# Patient Record
Sex: Male | Born: 1935 | Race: White | Hispanic: No | Marital: Married | Smoking: Former smoker
Health system: Southern US, Community
[De-identification: ages and names within clinical notes are randomized; demographics above are authoritative.]

## PROBLEM LIST (undated history)

## (undated) DIAGNOSIS — K219 Gastro-esophageal reflux disease without esophagitis: Secondary | ICD-10-CM

## (undated) DIAGNOSIS — Z951 Presence of aortocoronary bypass graft: Secondary | ICD-10-CM

## (undated) DIAGNOSIS — Z7901 Long term (current) use of anticoagulants: Secondary | ICD-10-CM

## (undated) DIAGNOSIS — M545 Low back pain, unspecified: Secondary | ICD-10-CM

## (undated) DIAGNOSIS — I509 Heart failure, unspecified: Secondary | ICD-10-CM

## (undated) DIAGNOSIS — K5790 Diverticulosis of intestine, part unspecified, without perforation or abscess without bleeding: Secondary | ICD-10-CM

## (undated) DIAGNOSIS — I714 Abdominal aortic aneurysm, without rupture, unspecified: Secondary | ICD-10-CM

## (undated) DIAGNOSIS — E785 Hyperlipidemia, unspecified: Secondary | ICD-10-CM

## (undated) DIAGNOSIS — M1990 Unspecified osteoarthritis, unspecified site: Secondary | ICD-10-CM

## (undated) DIAGNOSIS — Z950 Presence of cardiac pacemaker: Secondary | ICD-10-CM

## (undated) DIAGNOSIS — I209 Angina pectoris, unspecified: Secondary | ICD-10-CM

## (undated) DIAGNOSIS — I2510 Atherosclerotic heart disease of native coronary artery without angina pectoris: Secondary | ICD-10-CM

## (undated) DIAGNOSIS — G4730 Sleep apnea, unspecified: Secondary | ICD-10-CM

## (undated) DIAGNOSIS — I4821 Permanent atrial fibrillation: Secondary | ICD-10-CM

## (undated) DIAGNOSIS — I10 Essential (primary) hypertension: Secondary | ICD-10-CM

## (undated) DIAGNOSIS — IMO0001 Reserved for inherently not codable concepts without codable children: Secondary | ICD-10-CM

## (undated) HISTORY — PX: CATARACT EXTRACTION W/ INTRAOCULAR LENS  IMPLANT, BILATERAL: SHX1307

## (undated) HISTORY — PX: JOINT REPLACEMENT: SHX530

## (undated) HISTORY — PX: EYE SURGERY: SHX253

## (undated) HISTORY — DX: Gastro-esophageal reflux disease without esophagitis: K21.9

## (undated) HISTORY — DX: Presence of aortocoronary bypass graft: Z95.1

## (undated) HISTORY — DX: Abdominal aortic aneurysm, without rupture: I71.4

## (undated) HISTORY — DX: Unspecified osteoarthritis, unspecified site: M19.90

## (undated) HISTORY — DX: Permanent atrial fibrillation: I48.21

## (undated) HISTORY — DX: Low back pain: M54.5

## (undated) HISTORY — DX: Abdominal aortic aneurysm, without rupture, unspecified: I71.40

## (undated) HISTORY — DX: Presence of cardiac pacemaker: Z95.0

## (undated) HISTORY — DX: Long term (current) use of anticoagulants: Z79.01

## (undated) HISTORY — PX: TONSILLECTOMY: SUR1361

## (undated) HISTORY — DX: Diverticulosis of intestine, part unspecified, without perforation or abscess without bleeding: K57.90

## (undated) HISTORY — DX: Low back pain, unspecified: M54.50

## (undated) HISTORY — DX: Heart failure, unspecified: I50.9

## (undated) HISTORY — DX: Atherosclerotic heart disease of native coronary artery without angina pectoris: I25.10

## (undated) HISTORY — DX: Hyperlipidemia, unspecified: E78.5

---

## 1987-09-18 HISTORY — PX: CORONARY ARTERY BYPASS GRAFT: SHX141

## 1997-12-26 ENCOUNTER — Other Ambulatory Visit: Admission: RE | Admit: 1997-12-26 | Discharge: 1997-12-26 | Payer: Self-pay | Admitting: Cardiology

## 1999-09-01 ENCOUNTER — Ambulatory Visit (HOSPITAL_COMMUNITY): Admission: RE | Admit: 1999-09-01 | Discharge: 1999-09-01 | Payer: Self-pay | Admitting: Cardiology

## 2001-02-01 ENCOUNTER — Ambulatory Visit (HOSPITAL_COMMUNITY): Admission: RE | Admit: 2001-02-01 | Discharge: 2001-02-01 | Payer: Self-pay | Admitting: Gastroenterology

## 2003-04-04 ENCOUNTER — Encounter: Payer: Self-pay | Admitting: Internal Medicine

## 2003-04-04 ENCOUNTER — Encounter: Admission: RE | Admit: 2003-04-04 | Discharge: 2003-04-04 | Payer: Self-pay | Admitting: Internal Medicine

## 2004-08-26 ENCOUNTER — Ambulatory Visit (HOSPITAL_COMMUNITY): Admission: RE | Admit: 2004-08-26 | Discharge: 2004-08-26 | Payer: Self-pay | Admitting: Ophthalmology

## 2004-09-09 ENCOUNTER — Ambulatory Visit (HOSPITAL_COMMUNITY): Admission: AD | Admit: 2004-09-09 | Discharge: 2004-09-09 | Payer: Self-pay | Admitting: Ophthalmology

## 2005-06-19 ENCOUNTER — Encounter: Admission: RE | Admit: 2005-06-19 | Discharge: 2005-06-19 | Payer: Self-pay | Admitting: Internal Medicine

## 2005-10-23 ENCOUNTER — Encounter: Admission: RE | Admit: 2005-10-23 | Discharge: 2005-10-23 | Payer: Self-pay | Admitting: Internal Medicine

## 2007-02-15 HISTORY — PX: INSERT / REPLACE / REMOVE PACEMAKER: SUR710

## 2007-02-24 ENCOUNTER — Encounter (INDEPENDENT_AMBULATORY_CARE_PROVIDER_SITE_OTHER): Payer: Self-pay | Admitting: *Deleted

## 2007-02-24 ENCOUNTER — Inpatient Hospital Stay (HOSPITAL_COMMUNITY): Admission: AD | Admit: 2007-02-24 | Discharge: 2007-02-26 | Payer: Self-pay | Admitting: Cardiology

## 2008-10-23 ENCOUNTER — Encounter: Admission: RE | Admit: 2008-10-23 | Discharge: 2008-10-23 | Payer: Self-pay | Admitting: Internal Medicine

## 2010-02-03 ENCOUNTER — Encounter: Admission: RE | Admit: 2010-02-03 | Discharge: 2010-02-03 | Payer: Self-pay | Admitting: Cardiology

## 2010-02-06 ENCOUNTER — Ambulatory Visit (HOSPITAL_COMMUNITY): Admission: RE | Admit: 2010-02-06 | Discharge: 2010-02-06 | Payer: Self-pay | Admitting: Cardiology

## 2010-03-24 ENCOUNTER — Ambulatory Visit: Payer: Self-pay | Admitting: Cardiology

## 2010-04-14 ENCOUNTER — Ambulatory Visit: Payer: Self-pay | Admitting: Cardiology

## 2010-06-21 ENCOUNTER — Encounter: Payer: Self-pay | Admitting: Internal Medicine

## 2010-06-24 ENCOUNTER — Ambulatory Visit: Payer: Self-pay | Admitting: Cardiovascular Disease

## 2010-07-07 ENCOUNTER — Ambulatory Visit: Payer: Self-pay

## 2010-07-11 ENCOUNTER — Ambulatory Visit: Payer: Self-pay | Admitting: Cardiology

## 2010-08-14 ENCOUNTER — Ambulatory Visit: Payer: Self-pay | Admitting: Cardiology

## 2010-09-16 NOTE — Miscellaneous (Signed)
Summary: Device preload  Clinical Lists Changes  Observations: Added new observation of PPM INDICATN: A-flutter (06/21/2010 13:01) Added new observation of MAGNET RTE: BOL 85 ERI 65 (06/21/2010 13:01) Added new observation of PPMLEADSTAT2: active (06/21/2010 13:01) Added new observation of PPMLEADSER2: [id number]  (06/21/2010 13:01) Added new observation of PPMLEADMOD2: 4470  (06/21/2010 13:01) Added new observation of PPMLEADDOI2: 04/04/2007  (06/21/2010 13:01) Added new observation of PPMLEADLOC2: RV  (06/21/2010 13:01) Added new observation of PPMLEADSTAT1: active  (06/21/2010 13:01) Added new observation of PPMLEADSER1: 518841  (06/21/2010 13:01) Added new observation of PPMLEADMOD1: 4469  (06/21/2010 13:01) Added new observation of PPMLEADDOI1: 04/04/2007  (06/21/2010 13:01) Added new observation of PPMLEADLOC1: RA  (06/21/2010 13:01) Added new observation of PPM DOI: 04/04/2007  (06/21/2010 13:01) Added new observation of PPM SERL#: [id number]  (06/21/2010 13:01) Added new observation of PPM MODL#: [id number]  (06/21/2010 13:01) Added new observation of PACEMAKERMFG: Medtronic  (06/21/2010 13:01) Added new observation of PPM IMP MD: Duffy Rhody Tennant,MD  (06/21/2010 13:01) Added new observation of PPM REFER MD: Rolla Plate  (06/21/2010 13:01) Added new observation of PACEMAKER MD: Lewayne Bunting, MD  (06/21/2010 13:01)      PPM Specifications Following MD:  Lewayne Bunting, MD     Referring MD:  Rolla Plate PPM Vendor:  Medtronic     PPM Model Number:  [id number]     PPM Serial Number:  [id number] PPM DOI:  04/04/2007     PPM Implanting MD:  Rolla Plate  Lead 1    Location: RA     DOI: 04/04/2007     Model #: 4469     Serial #: [id number]     Status: active Lead 2    Location: RV     DOI: 04/04/2007     Model #: 4470     Serial #: [id number]     Status: active  Magnet Response Rate:  BOL 85 ERI 65  Indications:  A-flutter

## 2010-09-16 NOTE — Procedures (Signed)
Summary: pacer check/medtronic   Current Medications (verified): 1)  Metoprolol Tartrate 25 Mg Tabs (Metoprolol Tartrate) .... Take 1 Tablet By Mouth Once Daily 2)  Glucosamine 1500 Complex  Caps (Glucosamine-Chondroit-Vit C-Mn) .... Take 1 Capsule By Mouth Once Daily 3)  Fish Oil 1000 Mg Caps (Omega-3 Fatty Acids) .... Take 1 Capsule By Mouth Once Daily 4)  Vitamin D 1000 Unit Tabs (Cholecalciferol) .... Take 2000 International Units By Mouth  Once Daily 5)  Furosemide 20 Mg Tabs (Furosemide) .... Take 1 Tablet By Mouth Once Daily 6)  Aspir-Low 81 Mg Tbec (Aspirin) .... Take 1 Tablet By Mouth Once Daily 7)  Tylenol 325 Mg Tabs (Acetaminophen) .... Take 650mg  By Mouth Two Times A Day 8)  Lipitor 10 Mg Tabs (Atorvastatin Calcium) .... Take 1 Tablet By Mouth Once Daily 9)  Amlodipine Besylate 5 Mg Tabs (Amlodipine Besylate) .... Take 1 Tablet By Mouth Once Daily 10)  Omeprazole 20 Mg Cpdr (Omeprazole) .... Take 1 Capsule By Mouth As Needed  Allergies (verified): 1)  ! Morphine  PPM Specifications Following MD:  Lewayne Bunting, MD     Referring MD:  Rolla Plate PPM Vendor:  Medtronic     PPM Model Number:  [id number]     PPM Serial Number:  [id number] PPM DOI:  04/04/2007     PPM Implanting MD:  Rolla Plate  Lead 1    Location: RA     DOI: 04/04/2007     Model #: 4469     Serial #: [id number]     Status: active Lead 2    Location: RV     DOI: 04/04/2007     Model #: 4470     Serial #: [id number]     Status: active  Magnet Response Rate:  BOL 85 ERI 65  Indications:  A-flutter   PPM Follow Up Battery Voltage:  2.99 V       PPM Device Measurements Atrium  Amplitude: 3.0 mV, Impedance: 432 ohms, Threshold: 0.5 V at 0.4 msec Right Ventricle  Amplitude: 12.4 mV, Impedance: 432 ohms, Threshold: 1.0 V at 0.4 msec  Episodes MS Episodes:  0     Ventricular High Rate:  0     Atrial Pacing:  91.7%     Ventricular Pacing:  93.7%  Parameters Mode:  DDDR     Lower Rate Limit:  70     Upper  Rate Limit:  120 Paced AV Delay:  320     Sensed AV Delay:  300 Next Cardiology Appt Due:  09/17/2010 Tech Comments:  [place] [facility] PT---NORMAL DEVICE FUNCTION.  NO EPISODES RECORDED ON CHECK.  CHANGED PAV DELAY FROM 180 TO 320 AND SAV DELAY FROM 200 TO 300 TO DECREASE AMOUNT OF VP. PT ENROLLED IN [place] AND WILL CONTINUE TRANSMISSIONS AFTER OV W/SK.   ROV IN 3 MTHS W/SK. Vella Kohler  July 07, 2010 5:28 PM

## 2010-10-08 ENCOUNTER — Encounter: Payer: Self-pay | Admitting: Internal Medicine

## 2010-10-08 ENCOUNTER — Encounter (INDEPENDENT_AMBULATORY_CARE_PROVIDER_SITE_OTHER): Payer: Medicare Other | Admitting: Internal Medicine

## 2010-10-08 DIAGNOSIS — I482 Chronic atrial fibrillation, unspecified: Secondary | ICD-10-CM | POA: Insufficient documentation

## 2010-10-08 DIAGNOSIS — Z950 Presence of cardiac pacemaker: Secondary | ICD-10-CM

## 2010-10-08 DIAGNOSIS — I2510 Atherosclerotic heart disease of native coronary artery without angina pectoris: Secondary | ICD-10-CM

## 2010-10-08 DIAGNOSIS — I25810 Atherosclerosis of coronary artery bypass graft(s) without angina pectoris: Secondary | ICD-10-CM | POA: Insufficient documentation

## 2010-10-08 DIAGNOSIS — I4891 Unspecified atrial fibrillation: Secondary | ICD-10-CM

## 2010-10-08 DIAGNOSIS — I119 Hypertensive heart disease without heart failure: Secondary | ICD-10-CM | POA: Insufficient documentation

## 2010-10-08 DIAGNOSIS — I498 Other specified cardiac arrhythmias: Secondary | ICD-10-CM

## 2010-10-13 ENCOUNTER — Ambulatory Visit (INDEPENDENT_AMBULATORY_CARE_PROVIDER_SITE_OTHER): Payer: Medicare Other | Admitting: Nurse Practitioner

## 2010-10-13 DIAGNOSIS — I2510 Atherosclerotic heart disease of native coronary artery without angina pectoris: Secondary | ICD-10-CM

## 2010-10-13 DIAGNOSIS — I10 Essential (primary) hypertension: Secondary | ICD-10-CM

## 2010-10-13 DIAGNOSIS — I4891 Unspecified atrial fibrillation: Secondary | ICD-10-CM

## 2010-10-14 NOTE — Assessment & Plan Note (Signed)
Summary: 2-21- spk with pt aware of appt.gd   Visit Type:  PPM-Medtronic Referring Provider:  Dr. Deborah Chalk  CC:  no complaints.  History of Present Illness: Adam [name] is seen to establish pacemaker followup.  He is an 77 year old gentleman status post pacing 3 years ago for bradycardia receiving a Medtronic device with [employer] leads.  He has a history of coronary artery disease with bypass surgery in the late 80s. He had a catheterization the report of which we reviewed him a couple years ago demonstrating patent vein grafts bo all but his RCA. His ejection fraction at that time (2008) was normal. Myoview scanning July 2010 head no evidence of ischemia and normal ejection fraction.  He was found in the spring of 2011 to have atrial fibrillation. He underwent cardioversion. Subtotally his Coumadin was discontinued.  The thromboembolic risk factors are notable for age-23 vascular disease-one hypertension-1  Preventive Screening-Counseling & Management  Alcohol-Tobacco     Smoking Status: quit  Caffeine-Diet-Exercise     Does Patient Exercise: yes      Drug Use:  no.    Current Medications (verified): 1)  Metoprolol Tartrate 25 Mg Tabs (Metoprolol Tartrate) .[name].. 1 Two Times A Day 2)  Glucosamine 1500 Complex  Caps (Glucosamine-Chondroit-Vit C-Mn) .... Take 1 Capsule By Mouth Once Daily 3)  Fish Oil 1000 Mg Caps (Omega-3 Fatty Acids) .... Take 1 Capsule By Mouth Once Daily 4)  Vitamin D 1000 Unit Tabs (Cholecalciferol) .... Take 2000 International Units By Mouth  Once Daily 5)  Furosemide 20 Mg Tabs (Furosemide) .... Take 1 Tablet By Mouth Once Daily 6)  Aspir-Low 81 Mg Tbec (Aspirin) .... Take 1 Tablet By Mouth Once Daily 7)  Tylenol 325 Mg Tabs (Acetaminophen) .... Take 650mg  By Mouth Two Times A Day 8)  Lipitor 10 Mg Tabs (Atorvastatin Calcium) .... Take 1 Tablet By Mouth Once Daily 9)  Amlodipine Besylate 5 Mg Tabs (Amlodipine Besylate) .... Take 1 Tablet By Mouth Once  Daily 10)  Omeprazole 20 Mg Cpdr (Omeprazole) .... Take 1 Capsule By Mouth As Needed  Allergies (verified): 1)  ! Morphine  Past History:  Past Medical History: Last updated: 10/07/2010 Aneurysm-Aortic G E R D Hyperlipidemia CAD Irritable Bowel Syndrome Osteoarthritis Atrial Fibrillation  Past Surgical History: Bilateral knee surgeries Pacemaker Implantation 2008 CABG 1980s Cataract surgery bilateral  Family History: Father: Family History of Coronary Artery Disease:   Social History: Retired  Married  Tobacco Use - Former. [phone] Alcohol Use - no Regular Exercise - yes Drug Use - no Smoking Status:  quit Does Patient Exercise:  yes Drug Use:  no  Review of Systems       full review of systems was negative apart from a history of present illness and past medical historyexcept urinary retention and hard of hearing using hearing aids   Vital Signs:  Patient profile:   77 year old male Height:      68 inches Weight:      156.44 pounds BMI:     23.87 Pulse rate:   83 / minute BP sitting:   126 / 76  (left arm) Cuff size:   regular  Vitals Entered By: Caralee Ates CMA (October 08, 2010 10:41 AM)  Physical Exam  General:  Alert and oriented elderly Caucasian male appearing his stated agein no acute distress. HEENT  normal . Neck veins were flat; carotids brisk and full without bruits. No lymphadenopathy. Back without kyphosis. Lungs clear. Heart sounds regular without murmurs or  gallops. PMI nondisplaced. Abdomen soft with active bowel sounds without midline pulsation or hepatomegaly. Femoral pulses and distal pulses intact. Extremities were without clubbing cyanosis or edemaSkin warm and dry. Neurological exam grossly normal; affect was engaging no lymph nodes    PPM Specifications Following MD:  Lewayne Bunting, MD     Referring MD:  Rolla Plate PPM Vendor:  Medtronic     PPM Model Number:  [id number]     PPM Serial Number:  [id number] H PPM DOI:   04/04/2007     PPM Implanting MD:  Rolla Plate  Lead 1    Location: RA     DOI: 04/04/2007     Model #: 4469     Serial #: [id number]     Status: active Lead 2    Location: RV     DOI: 04/04/2007     Model #: 4470     Serial #: [id number]     Status: active  Magnet Response Rate:  BOL 85 ERI 65  Indications:  A-flutter   Parameters Mode:  DDDR     Lower Rate Limit:  70     Upper Rate Limit:  120 Paced AV Delay:  320     Sensed AV Delay:  300  Impression & Recommendations:  Problem # 1:  PACEMAKER, PERMANENT (ICD-V45.01) Device parameters and data were reviewed and no changes were made  he has had recurrent atrial fibrillation. The surface with a moderately rapid ventricular response although there is no associated symptoms that he can recall  Problem # 2:  ATRIAL FIBRILLATION (ICD-427.31) as above. It would be appropriate with his CHADS VASC score of 3-4 and a be a long-term oral anticoagulation. I would recommend that we start this and stopped his aspirin. He does have some bleeding with aspirin is superficial. I will check with Dr. Deborah Chalk His updated medication list for this problem includes:    Metoprolol Tartrate 25 Mg Tabs (Metoprolol tartrate) .[name]... 1 two times a day    Aspir-low 81 Mg Tbec (Aspirin) .[name]... Take 1 tablet by mouth once daily  Problem # 3:  SINUS BRADYCARDIA (ICD-427.81) he is atrially paced to possibly 80% of the time His updated medication list for this problem includes:    Metoprolol Tartrate 25 Mg Tabs (Metoprolol tartrate) .[name]... 1 two times a day    Aspir-low 81 Mg Tbec (Aspirin) .[name]... Take 1 tablet by mouth once daily    Amlodipine Besylate 5 Mg Tabs (Amlodipine besylate) .[name]... Take 1 tablet by mouth once daily  Problem # 4:  CORONARY ATHEROSCLEROSIS, ARTERY BYPASS GRAFT (ICD-414.04) stable on current medications His updated medication list for this problem includes:    Metoprolol Tartrate 25 Mg Tabs (Metoprolol tartrate) .[name]... 1 two times a day     Aspir-low 81 Mg Tbec (Aspirin) .[name]... Take 1 tablet by mouth once daily    Amlodipine Besylate 5 Mg Tabs (Amlodipine besylate) .[name]... Take 1 tablet by mouth once daily  Patient Instructions: 1)  Your physician recommends that you continue on your current medications as directed. Please refer to the Current Medication list given to you today. 2)  Your physician wants you to follow-up IO:NGEX WITH DR Graciela Husbands DEVICE   You will receive a reminder letter in the mail two months in advance. If you don't receive a letter, please call our office to schedule the follow-up appointment.

## 2010-10-16 ENCOUNTER — Encounter (INDEPENDENT_AMBULATORY_CARE_PROVIDER_SITE_OTHER): Payer: Medicare Other

## 2010-10-16 DIAGNOSIS — I4891 Unspecified atrial fibrillation: Secondary | ICD-10-CM

## 2010-10-16 DIAGNOSIS — Z7901 Long term (current) use of anticoagulants: Secondary | ICD-10-CM

## 2010-10-20 ENCOUNTER — Encounter (INDEPENDENT_AMBULATORY_CARE_PROVIDER_SITE_OTHER): Payer: Medicare Other

## 2010-10-20 DIAGNOSIS — Z7901 Long term (current) use of anticoagulants: Secondary | ICD-10-CM

## 2010-10-20 DIAGNOSIS — I4891 Unspecified atrial fibrillation: Secondary | ICD-10-CM

## 2010-10-23 ENCOUNTER — Encounter (INDEPENDENT_AMBULATORY_CARE_PROVIDER_SITE_OTHER): Payer: Self-pay | Admitting: *Deleted

## 2010-10-23 NOTE — Cardiovascular Report (Signed)
Summary: Office Visit   Office Visit   Imported By: Roderic Ovens 10/16/2010 16:06:02  _____________________________________________________________________  External Attachment:    Type:   Image     Comment:   External Document

## 2010-10-27 ENCOUNTER — Encounter (INDEPENDENT_AMBULATORY_CARE_PROVIDER_SITE_OTHER): Payer: Medicare Other

## 2010-10-27 DIAGNOSIS — Z7901 Long term (current) use of anticoagulants: Secondary | ICD-10-CM

## 2010-10-27 DIAGNOSIS — I4891 Unspecified atrial fibrillation: Secondary | ICD-10-CM

## 2010-10-28 NOTE — Letter (Signed)
Summary: Device-Delinquent Check  [facility], Main Office  [address]   [place], [place] [address]   Phone: [phone]  Fax: [phone]     October 23, 2010 [name]: [id number]   Adam [name] [address] [place], [place]  [address]   Dear Mr. Estala,  According to our records, you have not had your implanted device checked in the recommended period of time.  We are unable to determine appropriate device function without checking your device on a regular basis.  Please call our office to schedule an appointment as soon as possible.  If you are having your device checked by another physician, please call us so that we may update our records.  Thank you,  Letta Moynahan, EMT  October 23, 2010 12:47 PM   [facility]

## 2010-11-03 ENCOUNTER — Ambulatory Visit (INDEPENDENT_AMBULATORY_CARE_PROVIDER_SITE_OTHER): Payer: Medicare Other | Admitting: Cardiology

## 2010-11-03 DIAGNOSIS — I2510 Atherosclerotic heart disease of native coronary artery without angina pectoris: Secondary | ICD-10-CM

## 2010-11-03 DIAGNOSIS — Z7901 Long term (current) use of anticoagulants: Secondary | ICD-10-CM

## 2010-11-10 ENCOUNTER — Ambulatory Visit (INDEPENDENT_AMBULATORY_CARE_PROVIDER_SITE_OTHER): Payer: Medicare Other | Admitting: *Deleted

## 2010-11-10 DIAGNOSIS — I2510 Atherosclerotic heart disease of native coronary artery without angina pectoris: Secondary | ICD-10-CM

## 2010-11-10 DIAGNOSIS — Z7901 Long term (current) use of anticoagulants: Secondary | ICD-10-CM

## 2010-11-10 LAB — POCT INR: INR: 2.5

## 2010-12-03 ENCOUNTER — Other Ambulatory Visit (INDEPENDENT_AMBULATORY_CARE_PROVIDER_SITE_OTHER): Payer: Medicare Other | Admitting: *Deleted

## 2010-12-03 DIAGNOSIS — I4891 Unspecified atrial fibrillation: Secondary | ICD-10-CM

## 2010-12-09 ENCOUNTER — Encounter: Payer: Medicare Other | Admitting: *Deleted

## 2010-12-11 ENCOUNTER — Telehealth: Payer: Self-pay | Admitting: Cardiology

## 2010-12-11 NOTE — Telephone Encounter (Signed)
Would like to get a handicap permit. Please call back. I have pulled the chart.

## 2010-12-11 NOTE — Telephone Encounter (Signed)
RN left voicemail for pt to pick up a handicap sticker form from [facility] and drop it off here for Dr. Deborah Chalk to sign.  Pt told to call back with any questions.

## 2010-12-15 ENCOUNTER — Encounter: Payer: Self-pay | Admitting: Cardiology

## 2010-12-15 ENCOUNTER — Encounter: Payer: Self-pay | Admitting: Nurse Practitioner

## 2010-12-15 ENCOUNTER — Ambulatory Visit (INDEPENDENT_AMBULATORY_CARE_PROVIDER_SITE_OTHER): Payer: Medicare Other | Admitting: Nurse Practitioner

## 2010-12-15 VITALS — BP 122/66 | Wt 150.0 lb

## 2010-12-15 DIAGNOSIS — I4891 Unspecified atrial fibrillation: Secondary | ICD-10-CM

## 2010-12-15 DIAGNOSIS — I25810 Atherosclerosis of coronary artery bypass graft(s) without angina pectoris: Secondary | ICD-10-CM

## 2010-12-15 LAB — BASIC METABOLIC PANEL
BUN: 17 mg/dL (ref 6–23)
CO2: 30 mEq/L (ref 19–32)
Calcium: 9.3 mg/dL (ref 8.4–10.5)
Chloride: 99 mEq/L (ref 96–112)
Creatinine, Ser: 1 mg/dL (ref 0.4–1.5)
GFR: 80.3 mL/min (ref >60.00)
Glucose, Bld: 195 mg/dL — ABNORMAL HIGH (ref 70–99)
Potassium: 5.2 mEq/L — ABNORMAL HIGH (ref 3.5–5.1)
Sodium: 137 mEq/L (ref 135–145)

## 2010-12-15 LAB — CBC WITH DIFFERENTIAL/PLATELET
Basophils Absolute: 0 10*3/uL (ref 0.0–0.1)
Basophils Relative: 0.3 % (ref 0.0–3.0)
Eosinophils Absolute: 0.1 10*3/uL (ref 0.0–0.7)
Eosinophils Relative: 0.7 % (ref 0.0–5.0)
HCT: 38.2 % — ABNORMAL LOW (ref 39.0–52.0)
Hemoglobin: 12.9 g/dL — ABNORMAL LOW (ref 13.0–17.0)
Lymphocytes Relative: 17.5 % (ref 12.0–46.0)
Lymphs Abs: 1.5 10*3/uL (ref 0.7–4.0)
MCHC: 33.9 g/dL (ref 30.0–36.0)
MCV: 102.8 fl — ABNORMAL HIGH (ref 78.0–100.0)
Monocytes Absolute: 0.5 10*3/uL (ref 0.1–1.0)
Monocytes Relative: 6.3 % (ref 3.0–12.0)
Neutro Abs: 6.5 10*3/uL (ref 1.4–7.7)
Neutrophils Relative %: 75.2 % (ref 43.0–77.0)
Platelets: 141 10*3/uL — ABNORMAL LOW (ref 150.0–400.0)
RBC: 3.71 Mil/uL — ABNORMAL LOW (ref 4.22–5.81)
RDW: 14.1 % (ref 11.5–14.6)
WBC: 8.7 10*3/uL (ref 4.5–10.5)

## 2010-12-15 LAB — POCT INR: INR: 3

## 2010-12-15 LAB — TSH: TSH: 1.5 u[IU]/mL (ref 0.35–5.50)

## 2010-12-15 MED ORDER — DIGOXIN 125 MCG PO TABS: 125.0000 ug | ORAL_TABLET | Freq: Every day | ORAL | Status: DC

## 2010-12-15 NOTE — Progress Notes (Signed)
Vinson Moselle Date of Birth: 01-22-1925   History of Present Illness: Mr. Adam [name] is seen today for a work in visit. He is seen for Dr. Deborah Chalk. He has not felt well for about 2 weeks. He has less endurance. He is more short of breath. He wonders if he is back out of rhythm. He denies chest pain. He is more fatigued. He is not able to swim as far. He will be switching to Dr. Katrinka Blazing for his cardiology care. He has an appointment in June.   Current Outpatient Prescriptions on File Prior to Visit  Medication Sig Dispense Refill  . acetaminophen (TYLENOL) 650 MG CR tablet Take 1,300 mg by mouth daily.        [name] amLODipine (NORVASC) 5 MG tablet Take 5 mg by mouth daily.        [name] atorvastatin (LIPITOR) 10 MG tablet Take 10 mg by mouth daily.        . furosemide (LASIX) 20 MG tablet Take 20 mg by mouth daily.        . Glucosamine-Chondroit-Vit C-Mn (GLUCOSAMINE 1500 COMPLEX) CAPS Take by mouth daily.        . metoprolol tartrate (LOPRESSOR) 25 MG tablet Take 25 mg by mouth 2 (two) times daily.        . Omega-3 Fatty Acids (FISH OIL) 1000 MG CPDR Take by mouth daily.        [name] omeprazole (PRILOSEC) 20 MG capsule Take 20 mg by mouth daily.        [name] VITAMIN D, CHOLECALCIFEROL, PO Take 2,000 Int'l Units by mouth daily.        . WARFARIN SODIUM PO Take by mouth. As directed       . digoxin (LANOXIN) 0.125 MG tablet Take 1 tablet (125 mcg total) by mouth daily.  30 tablet  11    Allergies  Allergen Reactions  . Morphine     Past Medical History  Diagnosis Date  . CAD (coronary artery disease)   . S/P CABG (coronary artery bypass graft)   . Hyperlipidemia   . GERD (gastroesophageal reflux disease)   . Generalized OA   . Presence of permanent cardiac pacemaker   . PAF (paroxysmal atrial fibrillation)     Cardioversion June 2011  . Chronic anticoagulation   . Aortic aneurysm     Past Surgical History  Procedure Date  . Bilateral knee arthroscopy   . Coronary artery bypass graft Feb 1989    . Cardiac catheterization July 2008    Occluded SVG to LCX  . Cardiovascular stress test July 2010    No ischemia. Normal EF  . Permanent pacemaker July 2008    History  Smoking status  . Former Smoker  . Quit date: 08/17/1948  Smokeless tobacco  . Never Used    History  Alcohol Use No    Family History  Problem Relation Age of Onset  . Stroke Mother   . Heart attack Father     Review of Systems: The review of systems is as above. He does not really have palpitations. He is not really lightheaded. He just feels weak, short of breath and washed out.  All other systems were reviewed and are negative.  Physical Exam: BP 122/66  Wt 150 lb (68.04 kg) Patient is very pleasant and in no acute distress. He looks younger than his stated age. Skin is warm and dry. Color is normal.  HEENT is unremarkable. Normocephalic/atraumatic. PERRL. Sclera are nonicteric. Neck  is supple. No masses. No JVD. Lungs are clear. Cardiac exam shows an irregular rate and rhythm. Abdomen is soft. Extremities are without edema. Gait and ROM are intact. No gross neurologic deficits noted.  LABORATORY DATA:  Pacemaker check shows recurrent atrial fib. This episode has been going on for about 16 days. His EKG shows V Pacing.                                         INR today is 3.0  Assessment / Plan:

## 2010-12-15 NOTE — Assessment & Plan Note (Signed)
He is not having chest pain. We will keep him on his current regimen.

## 2010-12-15 NOTE — Assessment & Plan Note (Signed)
His pacemaker check shows recurrent atrial fib. The episode coincides with the duration of his symptoms. I have discussed his case with Dr. Deborah Chalk. We will try to smooth him out with Digoxin. We will start him on 0.125mg  daily. He may require antiarrhythmic therapy. He is on his coumadin and has been therapeutic. We will check his labs today. He has an appointment to see Dr. Deborah Chalk on the 11th of May. He will be switching to Dr. Katrinka Blazing in June. Patient is agreeable to this plan and will call if any problems develop in the interim.

## 2010-12-15 NOTE — Patient Instructions (Addendum)
I am going to start you on Digoxin (lanoxin). One tablet daily. Keep your appointment next week with Dr. Deborah Chalk. We will recheck your pacemaker on return. Your activity may be as you tolerate.

## 2010-12-16 ENCOUNTER — Telehealth: Payer: Self-pay | Admitting: *Deleted

## 2010-12-16 NOTE — Telephone Encounter (Signed)
Message copied by Barnetta Hammersmith on Tue Dec 16, 2010  1:09 PM ------      Message from: Norma Fredrickson      Created: Mon Dec 15, 2010  4:53 PM       Ok to report. Labs are satisfactory.

## 2010-12-16 NOTE — Telephone Encounter (Signed)
Pt notified of lab results and to continue same medications.   

## 2010-12-26 ENCOUNTER — Ambulatory Visit (INDEPENDENT_AMBULATORY_CARE_PROVIDER_SITE_OTHER): Payer: Medicare Other | Admitting: Cardiology

## 2010-12-26 ENCOUNTER — Encounter: Payer: Self-pay | Admitting: Cardiology

## 2010-12-26 ENCOUNTER — Telehealth: Payer: Self-pay | Admitting: *Deleted

## 2010-12-26 VITALS — BP 146/88 | HR 74 | Ht 68.0 in | Wt 161.4 lb

## 2010-12-26 DIAGNOSIS — I4891 Unspecified atrial fibrillation: Secondary | ICD-10-CM

## 2010-12-26 LAB — CBC WITH DIFFERENTIAL/PLATELET
Basophils Absolute: 0 10*3/uL (ref 0.0–0.1)
Basophils Relative: 0.4 % (ref 0.0–3.0)
Eosinophils Absolute: 0.1 10*3/uL (ref 0.0–0.7)
Eosinophils Relative: 0.6 % (ref 0.0–5.0)
HCT: 39.7 % (ref 39.0–52.0)
Hemoglobin: 13.6 g/dL (ref 13.0–17.0)
Lymphocytes Relative: 23.9 % (ref 12.0–46.0)
Lymphs Abs: 1.9 10*3/uL (ref 0.7–4.0)
MCHC: 34.2 g/dL (ref 30.0–36.0)
MCV: 102.6 fl — ABNORMAL HIGH (ref 78.0–100.0)
Monocytes Absolute: 0.7 10*3/uL (ref 0.1–1.0)
Monocytes Relative: 8.2 % (ref 3.0–12.0)
Neutro Abs: 5.4 10*3/uL (ref 1.4–7.7)
Neutrophils Relative %: 66.9 % (ref 43.0–77.0)
Platelets: 124 10*3/uL — ABNORMAL LOW (ref 150.0–400.0)
RBC: 3.87 Mil/uL — ABNORMAL LOW (ref 4.22–5.81)
RDW: 13.9 % (ref 11.5–14.6)
WBC: 8.1 10*3/uL (ref 4.5–10.5)

## 2010-12-26 LAB — COMPREHENSIVE METABOLIC PANEL
ALT: 28 U/L (ref 0–53)
AST: 29 U/L (ref 0–37)
Albumin: 4.2 g/dL (ref 3.5–5.2)
Alkaline Phosphatase: 64 U/L (ref 39–117)
BUN: 14 mg/dL (ref 6–23)
CO2: 30 mEq/L (ref 19–32)
Calcium: 9.3 mg/dL (ref 8.4–10.5)
Chloride: 106 mEq/L (ref 96–112)
Creatinine, Ser: 0.7 mg/dL (ref 0.4–1.5)
GFR: 108.82 mL/min (ref >60.00)
Glucose, Bld: 93 mg/dL (ref 70–99)
Potassium: 5.7 mEq/L — ABNORMAL HIGH (ref 3.5–5.1)
Sodium: 143 mEq/L (ref 135–145)
Total Bilirubin: 0.7 mg/dL (ref 0.3–1.2)
Total Protein: 6.9 g/dL (ref 6.0–8.3)

## 2010-12-26 LAB — TSH: TSH: 2.37 u[IU]/mL (ref 0.35–5.50)

## 2010-12-26 LAB — POCT INR: INR: 2.8

## 2010-12-26 NOTE — Assessment & Plan Note (Signed)
He has recurrent atrial fibrillation and I think we can proceed on with cardioversion. He has a permanent pacemaker and will need Medtronic to be available to interrogate the device. He's been anticoagulated for 2 months and does have symptoms of shortness of breath is most consistent with atrial fibrillation. He subsequent followup will be with Dr. Garnette Scheuermann beginning on January 16, 2011

## 2010-12-26 NOTE — Progress Notes (Signed)
Subjective:   Adam [name] is noted increasing shortness of breath that correlates with the return of atrial fibrillation after interrogation with his pacemaker. He has been on warfarin anticoagulation since March 5 when he was therapeutic. He had cardioversion for atrial fibrillation in June of 2011 and did well for 6-8 months.  He has an appointment to see Dr. Katrinka Blazing for cardiology followup on June 1. We will try to proceed on with cardioversion next week in hopes that he will have a good result with simple cardioversion and not need the addition of antiarrhythmic drugs.  He has known ischemic heart disease and a history of bypass grafting in February of 1989. He a followup catheterization in July 2008 that showed well-preserved left ventricular function with atrial flutter. He's had severe three-vessel coronary disease and had a totally occluded saphenous vein graft to left circumflex and a patent saphenous vein graft to the diagonal, patent saphenous vein graft the distal branches the right coronary artery, and a patent left internal mammary graft to the LAD. We felt we could manage him medically. He a stress Cardiolite study in July 2010 which showed good exercise tolerance and an ejection fraction of 61%. He's had bilateral knee surgeries. He's had a history of hyperlipemia. He a permanent transvenous pacemaker placed in July 2008. He had cardioversion for atrial fibrillation in June of 2011.  Current Outpatient Prescriptions  Medication Sig Dispense Refill  . acetaminophen (TYLENOL) 650 MG CR tablet Take 1,300 mg by mouth daily.        [name] amLODipine (NORVASC) 5 MG tablet Take 5 mg by mouth daily.        [name] atorvastatin (LIPITOR) 10 MG tablet Take 10 mg by mouth daily.        . digoxin (LANOXIN) 0.125 MG tablet Take 1 tablet (125 mcg total) by mouth daily.  30 tablet  11  . furosemide (LASIX) 20 MG tablet Take 20 mg by mouth daily.        . Glucosamine-Chondroit-Vit C-Mn (GLUCOSAMINE 1500 COMPLEX) CAPS Take  by mouth daily.        . metoprolol tartrate (LOPRESSOR) 25 MG tablet Take 25 mg by mouth 2 (two) times daily.        . Omega-3 Fatty Acids (FISH OIL) 1000 MG CPDR Take by mouth daily.        [name] omeprazole (PRILOSEC) 20 MG capsule Take 20 mg by mouth daily.        [name] VITAMIN D, CHOLECALCIFEROL, PO Take 2,000 Int'l Units by mouth daily.        . WARFARIN SODIUM PO Take by mouth. As directed         Allergies  Allergen Reactions  . Morphine     Patient Active Problem List  Diagnoses  . HYPERTENSION, HEART CONTROLLED W/O ASSOC CHF  . CORONARY ATHEROSCLEROSIS, ARTERY BYPASS GRAFT  . ATRIAL FIBRILLATION  . PACEMAKER, PERMANENT    History  Smoking status  . Former Smoker  . Quit date: 08/17/1948  Smokeless tobacco  . Never Used    History  Alcohol Use No    Family History  Problem Relation Age of Onset  . Stroke Mother   . Heart attack Father     Review of Systems:   The patient denies any heat or cold intolerance.  No weight gain or weight loss.  The patient denies headaches or blurry vision.  There is no cough or sputum production.  The patient denies dizziness.  There is no hematuria  or hematochezia.  The patient denies any muscle aches or arthritis.  The patient denies any rash.  The patient denies frequent falling or instability.  There is no history of depression or anxiety.  All other systems were reviewed and are negative.   Physical Exam:   Weight is 156. Vital signs are reviewed.The head is normocephalic and atraumatic.  Pupils are equally round and reactive to light.  Sclerae nonicteric.  Conjunctiva is clear.  Oropharynx is unremarkable.  There's adequate oral airway.  Neck is supple there are no masses.  Thyroid is not enlarged.  There is no lymphadenopathy.  Lungs are clear.  Chest is symmetric.  Heart shows a irregular rate and rhythm.  S1 and S2 are normal.  There is soft apical murmur. Abdomen is soft normal bowel sounds.  There is no organomegaly.  Genital and  rectal deferred.  Extremities are without edema.  Peripheral pulses are adequate.  Neurologically intact.  Full range of motion.  The patient is not depressed.  Skin is warm and dry.  Assessment / Plan:

## 2010-12-26 NOTE — Telephone Encounter (Signed)
LM for pt to call back regarding labs.

## 2010-12-27 LAB — DIGOXIN LEVEL: Digoxin Level: 0.6 ng/mL — ABNORMAL LOW (ref 0.8–2.0)

## 2010-12-30 NOTE — Cardiovascular Report (Signed)
NAME:  TAYDEN, NICHELSON NO.:  [id number]   MEDICAL RECORD NO.:  [id number]          PATIENT TYPE:  INP   LOCATION:  6533                         FACILITY:  [facility]   PHYSICIAN:  Colleen Can. Deborah Chalk, M.D.DATE OF BIRTH:  May 14, 1925   DATE OF PROCEDURE:  02/25/2007  DATE OF DISCHARGE:                            CARDIAC CATHETERIZATION   PROCEDURE:  Implantation of a dual-chamber pulse generator system under  fluoroscopy with pacing conversion of atrial flutter to sinus rhythm.   INDICATIONS FOR PROCEDURE:  Atrial flutter with a slow ventricular  response and periods of tachycardia (tachy-brady syndrome).   PROCEDURE:  The right subclavicular area is prepped and draped.  A  subcutaneous pocket was created through the prepectoral fascia using  sharp and Bovie dissection.  There was superficial arterial bleeding  which was controlled with ligatures. Using two punctures into the  subclavian vein, guide wires were introduced.  Using 7-French [facility]  introducers, the atrial and ventricular leads were introduced.  The  ventricular lead was a Guidant bipolar endocardial pacing lead, model  4470 52-cm lead, serial number [id number].  We initially tried to place  this on the RV septum and I was unhappy with the position on the cardiac  silhouette and suggestive of coronary sinus placement.  We subsequently  returned and placed it in the RV apex.  The thresholds obtained show R  waves measure 8.1 mV.  Ventricular capture is 0.5 V with a current of 28  mA and 0.5 msec pulse width.  The ventricular lead impedance was 749  ohms.  The lead was sutured in place.  The atrial lead was a Guidant  model 4469 45-cm lead, serial number [id number].  The lead was  introduced through a 7-French [employer] introducer.  It was positioned in the  right atrial appendage.  The atrial flutter measurements show atrial  flutter waves and a 3.0 mV.  After burst pacing, the atrial flutter was  terminated and  atrial pacing rate of 330 beats per minute.  T waves  measured 2.8 mV.  The atrial capture threshold is 0.6 V with a current  of 1.7 mA at 0.5 msec pulse width. The atrial lead impedance was 433  ohms. The lead was sutured in place.   The wound was flushed with kanamycin solution.  The leads were connected  to a Medtronic EnRhythm P1501DR, serial number [id number].  The unit was  sutured in place.  The wound was closed with 2-0 and, subsequent, 4-0  Vicryl.  Steri-Strips were applied.  The patient tolerated the procedure  well.  The patient left the procedure in sinus rhythm.      Colleen Can. Deborah Chalk, M.D.  Electronically Signed     SNT/MEDQ  D:  02/25/2007  T:  02/26/2007  Job:  [id number]

## 2010-12-30 NOTE — H&P (Signed)
NAME:  Adam [name], Adam [name] NO.:  [id number]   MEDICAL RECORD NO.:  [id number]            PATIENT TYPE:   LOCATION:                                 FACILITY:   PHYSICIAN:  Colleen Can. Deborah Chalk, M.D.DATE OF BIRTH:  Dec 23, 1924   DATE OF ADMISSION:  02/23/2007  DATE OF DISCHARGE:                              HISTORY & PHYSICAL   CHIEF COMPLAINT:  Chest pain.   HISTORY OF PRESENT ILLNESS:  Adam [name] is a very pleasant 77 year old  white male who has a known history of ischemic heart disease.  He had  remote coronary artery bypass grafting in February 1989.  He presents to  the office for a work-in appointment this morning on February 23, 2007.  He  notes that over the last 10 days or so, he began to have recurrent  angina.  He describes chest discomfort that occurs especially with  exertion.  It is very similar to his previous chest pain syndrome.  He  has had it with exercising.  He has had associated shortness of breath  as well as some nausea but no actual vomiting.  Last night he took  nitroglycerin x1 after speaking to the physician on call.  He was  referred to the office this morning.  EKG shows a new onset of atrial  flutter with a slow ventricular response.  He is currently pain-free.  He is referred now for cardiac catheterization prior to initiation of  Coumadin anticoagulation.  He is currently pain-free.   ALLERGIES:  NONE.   CURRENT MEDICATIONS:  1. Zantac 150 a day.  2. Calcium with vitamin D daily.  3. Omeprazole 20 mg a day.  4. Glucosamine daily.  5. Vytorin 10/40 daily.  6. Baby aspirin daily.   PAST MEDICAL HISTORY:  1. Known atherosclerotic cardiovascular disease.  He underwent      coronary artery bypass grafting x6 per Dr. Particia Lather in      February 1989 with left internal mammary artery to the left      anterior descending, vein graft to the diagonal, vein graft to the      posterior descending and posterolateral branches of the right   coronary and a vein graft to the left circumflex, OM1 and OM2.  His      last catheterization occurred in January 2001.  He had normal LV      function.  He had an occluded saphenous vein graft to the left      circumflex with a relatively small vessel with moderate severe      diffuse disease, the LAD was occluded.  There was a patent left      internal mammary graft to the LAD and the vein graft to the right      coronary was patent.  There is an occluded distal right coronary      artery but the vein grafts to the posterior descending and      posterolateral branches were patent.  He has been managed medically      since that time.  His last Cardiolite study  was in August 2006,      which showed no evidence of ischemia with normal LV function.  A      small inferobasal scar could not be excluded, yet was felt to be      consistent with his known coronary anatomy.  2. Hyperlipidemia.  3. Positive family history of coronary disease.  4. GERD.  5. Previous bilateral knee surgeries.  6. Known abdominal aneurysm followed yearly with last measurement at      3.7 cm.  7. Previous colonoscopy in June of 2007.  8. Questionable irritable bowel syndrome.  9. Osteoarthritis.   FAMILY HISTORY:  Positive for heart disease.   SOCIAL HISTORY:  He is married.  He has three children.  He has no  tobacco use.  He does have social alcohol use.   REVIEW OF SYSTEMS:  His chest pain is as described above.  It is  localized to the sternum and really does not seem to be radiating.  He  has had associated shortness of breath and nausea but no vomiting.  He  did have relief with nitroglycerin.  He has had no other episodes of  shortness of breath.  He is not lightheaded or dizzy.  He has had more  in the way of irritable bowel syndrome.  He did have a colonoscopy a  little over one year ago.   PHYSICAL EXAMINATION:  GENERAL APPEARANCE:  He is currently pain-free.  He is very pleasant.  He is in no  acute distress.  VITAL SIGNS:  His weight is 162, blood pressure 146/86 sitting, 146/88  standing, heart rate is 52 and irregular, respirations 18, he is  afebrile.  SKIN:  Warm and dry.  Color is unremarkable.  LUNGS:  Basically clear.  CARDIOVASCULAR:  Irregular and slow rhythm.  ABDOMEN:  Soft.  EXTREMITIES:  Without edema.  He does have a dressing over a laceration  to the lower leg.  NEUROLOGIC:  Intact.  There are no gross focal deficits.   PERTINENT LABORATORY DATA:  Pending.   His EKG shows new onset of atrial flutter with slow ventricular  response.   OVERALL IMPRESSION:  1. Chest pain, exertional.  2. Nonischemic heart disease with remote coronary artery bypass      grafting.  3. New onset of atrial flutter.   PLAN:  Will proceed on with cardiac catheterization prior to initiation  of Coumadin anticoagulation.  He is currently on no rate slowing  medicines.  Further plan to follow per Dr. Ronnald Nian discretion.      Sharlee Blew, N.P.      Colleen Can. Deborah Chalk, M.D.  Electronically Signed    LC/MEDQ  D:  02/23/2007  T:  02/23/2007  Job:  [id number]

## 2010-12-30 NOTE — Cardiovascular Report (Signed)
NAME:  TRADARIUS, REINWALD NO.:  [id number]   MEDICAL RECORD NO.:  [id number]          PATIENT TYPE:  OIB   LOCATION:  2807                         FACILITY:  [facility]   PHYSICIAN:  Colleen Can. Deborah Chalk, M.D.DATE OF BIRTH:  Mar 12, 1925   DATE OF PROCEDURE:  02/23/2007  DATE OF DISCHARGE:                            CARDIAC CATHETERIZATION   Mr. Adam [name] presents with atrial fibrillation, with a controlled  ventricular response.  He is on no rate-slowing medicines.  He has had  intermittent episodes of substernal chest discomfort over the last 2  weeks.  They have had somewhat of an exertional nature.   PROCEDURE:  Left heart catheterization, with selective coronary  angiography, left ventricular angiography, saphenous vein graft  angiography x3, angiography of the left internal mammary artery.   TYPE AND SITE OF ENTRY:  Percutaneous right femoral artery.   CATHETERS:  6-French 4 curved Judkins right and left coronary catheters,  6-French pigtail ventriculographic catheter, left internal mammary graft  catheter, a right coronary artery bypass graft catheter.   CONTRAST MATERIAL:  Omnipaque.   MEDICATIONS GIVEN PRIOR TO THE PROCEDURE:  Valium 10 mg p.o.   MEDICATIONS GIVEN DURING THE PROCEDURE:  Versed 2 mg IV.   COMMENTS:  The patient tolerated the procedure well.   HEMODYNAMIC DATA:  The aortic pressure was 155/67, LV was 1216-11.  There was no aortic valve gradient present on pullback.  Pullback aortic  pressure was 121/61.   ANGIOGRAPHIC DATA:  1. Left main coronary artery is essentially normal.  There is an      atrial branch that arises from what appears to the proximal atrial      branch that arises off of the left circumflex.  2. His left anterior descending is totally occluded after first septal      perforating branch.  3. His left circumflex has diffuse 50% narrowings.  There is a 70-80%      stenosis ostially.  Overall, the left circumflex and multiple  small      branches are without obstructive disease. but have at least 4      different branches, with moderate diffuse 50% stenosis.  4. The right coronary artery is totally occluded in the midportion.  5. His saphenous vein graft to the left circumflex is totally      occluded.  6. Saphenous vein graft to the diagonal vessel is a smooth graft, with      nice a insertion and good distal runoff.  7. His saphenous vein graft to the posterior descending is widely      patent, with excellent flow and good distal runoff.  8. His left internal mammary graft to the LAD is widely patent, with a      nice insertion and excellent distal runoff.   Left ventricular angiogram was performed in RAO position.  Overall  cardiac size is normal.  There is normal global ejection fraction, with  it estimated to be approximately 60%.  Regional wall motion appear to be  normal.  There was no significant mitral regurgitation.   OVERALL IMPRESSION:  1. Well-preserved left  ventricular function, with atrial flutter      present.  2. Totally occluded right coronary artery, totally occluded left      anterior descending, and severe diffuse atherosclerosis in the      small branches of the left circumflex.  3. Totally occluded saphenous vein graft to the left circumflex, but      with patent saphenous vein graft to the diagonal, patent saphenous      to both distal branches of the right coronary artery, and a patent      left internal mammary graft to the LAD.   DISCUSSION:  Overall, this is almost the identical findings as to what  was present in the 2001 catheterization.  It is felt that we can manage  Mr. Cona medically from a coronary standpoint, but will have to  address his atrial flutter.  He does have a relatively slow ventricular  response, and it may be that he will need pacing should there be a  attempted at cardioversion.      Colleen Can. Deborah Chalk, M.D.  Electronically Signed     SNT/MEDQ   D:  02/23/2007  T:  02/24/2007  Job:  [id number]

## 2010-12-31 ENCOUNTER — Telehealth: Payer: Self-pay | Admitting: *Deleted

## 2010-12-31 ENCOUNTER — Encounter: Payer: Medicare Other | Admitting: *Deleted

## 2010-12-31 ENCOUNTER — Ambulatory Visit (HOSPITAL_COMMUNITY)
Admission: RE | Admit: 2010-12-31 | Discharge: 2010-12-31 | Disposition: A | Discharge status: Home / Self Care | Payer: Medicare Other | Source: Ambulatory Visit | Attending: Cardiology | Admitting: Cardiology

## 2010-12-31 DIAGNOSIS — I4891 Unspecified atrial fibrillation: Secondary | ICD-10-CM

## 2010-12-31 DIAGNOSIS — I2510 Atherosclerotic heart disease of native coronary artery without angina pectoris: Secondary | ICD-10-CM | POA: Insufficient documentation

## 2010-12-31 DIAGNOSIS — Z950 Presence of cardiac pacemaker: Secondary | ICD-10-CM | POA: Insufficient documentation

## 2010-12-31 LAB — APTT: aPTT: 38 seconds — ABNORMAL HIGH (ref 24–37)

## 2010-12-31 LAB — PROTIME-INR
INR: 2.58 — ABNORMAL HIGH (ref 0.00–1.49)
Prothrombin Time: 27.8 seconds — ABNORMAL HIGH (ref 11.6–15.2)

## 2010-12-31 NOTE — Telephone Encounter (Signed)
Pt's wife notified of lab results and was instructed to have pt cut back on potassium rich foods.  Pt's wife verbalized to RN understanding of instructions.

## 2010-12-31 NOTE — Telephone Encounter (Signed)
Message copied by Barnetta Hammersmith on Wed Dec 31, 2010  8:03 AM ------      Message from: Roger Shelter      Created: Fri Dec 26, 2010  3:51 PM       Ok, see post cardioversion.

## 2011-01-01 ENCOUNTER — Telehealth: Payer: Self-pay | Admitting: Cardiology

## 2011-01-01 NOTE — Telephone Encounter (Signed)
Labs results reported. Pt informed results also sent to Dr Garnette Scheuermann.

## 2011-01-02 NOTE — Op Note (Signed)
NAME:  Adam [name], Adam [name] NO.:  [id number]   MEDICAL RECORD NO.:  [id number]          PATIENT TYPE:  OIB   LOCATION:  2899                         FACILITY:  [facility]   PHYSICIAN:  Guadelupe Sabin, M.D.DATE OF BIRTH:  09-18-1924   DATE OF PROCEDURE:  09/09/2004  DATE OF DISCHARGE:  09/09/2004                                 OPERATIVE REPORT   READMISSION DIAGNOSIS:  Retained lens.   PREOPERATIVE DIAGNOSIS:  Retained lens fragments following cataract surgery  August 26, 2004.   POSTOPERATIVE DIAGNOSIS:  Retained lens fragments following cataract surgery  August 26, 2004.   NAME OF OPERATION:  Removal of cataract fragments, left eye.   SURGEON:  Guadelupe Sabin, M.D.   ASSISTANT:  Nurse.   ANESTHESIA:  Local 4% Xylocaine, 0.75 Marcaine retrobulbar block with Wydase  added, topical tetracaine.   OPERATIVE PROCEDURE:  After the patient was prepped and draped, a lid  speculum was inserted in the left eye. The eye was turned downward and a  superior rectus traction suture placed. The ocular microscope was aligned.  Incision was made with the MVR blade at the 11 o'clock and 4 o'clock  position of the left eye. The Lewicki anterior chamber infusion terminal 4  to 5 mm length was inserted into the corneal incision and infusion began.  The same MVR blade was used at the 11 o'clock position the handpiece of the  vitreous infusion suction cutter was then placed in the anterior chamber.  Very slow aspiration infusion was then performed and the nuclear cataract  fragment removed as well as other cortical cataract remnants from under the  iris. At the intraocular lens appeared to be well-centered. Movement and  checking of the lens, however, revealed the previously noted dialysis of the  previous of the posterior capsule and zonules from the 6:30 to 8:30  position. It was felt that further manipulation might cause decentration of  the well-centered intraocular lens  implant.  It was therefore elected to  close.  The instruments were withdrawn from the anterior chamber. The  incisions appeared to be self-sealing and no sutures were required. The  anterior chamber remained deep throughout the procedure and at the end of  the procedure. Maxitrol ointment was instilled in the conjunctival cul-de-  sac and a light patch protector shield applied. Duration of procedure 30-45  minutes. The patient tolerated the procedure well in general, left the  operating room for the recovery room in good condition.      HNJ/MEDQ  D:  10/19/2004  T:  10/20/2004  Job:  [id number]

## 2011-01-02 NOTE — H&P (Signed)
NAME:  Adam [name], Adam [name] NO.:  [id number]   MEDICAL RECORD NO.:  [id number]          PATIENT TYPE:  OIB   LOCATION:  2899                         FACILITY:  [facility]   PHYSICIAN:  Guadelupe Sabin, M.D.DATE OF BIRTH:  17-Apr-1925   DATE OF ADMISSION:  09/09/2004  DATE OF DISCHARGE:  09/09/2004                                HISTORY & PHYSICAL   This was a planned outpatient readmission of this 77 year old white male  admitted for retained cataract remnants following cataract surgery August 26, 2004.   PRESENT ILLNESS:  This patient had complicated cataract implant surgery on  August 26, 2004, with zonular dialysis in the infratemporal inferonasal  quadrant of the left eye. There was enough support to place a posterior  chamber intraocular lens implant, and a peripheral iridectomy was performed.  The patient tolerated the procedure well and was discharged as an  outpatient. He was followed in the office postoperatively. He was noted to  have a small nuclear cataract remnant in the anterior chamber angle.  It was  felt that this was causing irritation and inflammation and should be a  removed. The patient was given oral discussion and printed information  concerning the procedure and its possible complications. He signed an  informed consent, and arrangements made for his outpatient admission at this  time.   PAST MEDICAL HISTORY:  The patient is in stable general health under the  care of Dr. Earl Gala  (see detailed previous admission History and Physical)   REVIEW OF SYSTEMS:  No cardiorespiratory complaints.   PHYSICAL EXAMINATION:  VITAL SIGNS: As recorded on readmission, blood  pressure 137/74, temperature 97, pulse 64, respirations 18.  GENERAL APPEARANCE: The patient is a pleasant, well-nourished, well-  developed white male in no acute distress.  HEENT: Eyes: Visual acuity 20/30 -2 right eye, hand motion left eye.  Applanation tonometry: 17 mm right eye, 32  left eye. Slit lamp examination:  The eyes are white and clear.  The left eye shows a clear cornea and deep  and clear anterior chamber. A small 1-2 mm brunescent nuclear cataract  fragment is present in the angle inferiorly. The pupil is dilated, and a  peripheral iridectomy is present.  His dilated fundus examination shows no  posterior vitreous lens retained fragments, but the view is somewhat  compromised due to the residual nuclear fragment in the anterior chamber and  residual cortical cataract remnants in the periphery.  CHEST:  Lungs clear to percussion and auscultation.  HEART: Normal sinus rhythm, no cardiomegaly. No murmurs.  ABDOMEN: Negative.  EXTREMITIES: Negative.   READMISSION DIAGNOSES:  Retained cataract fragments following recent  cataract surgery August 26, 2004.   SURGICAL PLAN:  Removal of cataract remnants.      HNJ/MEDQ  D:  10/19/2004  T:  10/19/2004  Job:  [id number]

## 2011-01-02 NOTE — H&P (Signed)
NAME:  Adam [name], FIDEL NO.:  [id number]   MEDICAL RECORD NO.:  [id number]          PATIENT TYPE:  OIB   LOCATION:  2899                         FACILITY:  [facility]   PHYSICIAN:  Guadelupe Sabin, M.D.DATE OF BIRTH:  1925/06/16   DATE OF ADMISSION:  08/26/2004  DATE OF DISCHARGE:                                HISTORY & PHYSICAL   This was a planned outpatient surgical admission of this 77 year old white  male admitted for cataract implant surgery of the left eye.   PRESENT ILLNESS:   PRESENT ILLNESS:  This patient has been followed in my office for routine  eye care since  April 21, 1988.  Examination at that time revealed a  visual acuity without correction of 20/30 right eye, 20/40 left eye.  Examination was essentially normal with presbyopia.  The patient given  glasses for refractive correction.  In 1998 the patient was seen for a few  vitreous floaters but felt to be normal. No retinal tears or detachment  areas were seen.  Cataract formation was noted in April 2004 and this has  progressed until the point where it was affecting his vision. The patient  stated he could no longer follow his golf ball, was having difficulty with  night driving and small print.  He elected to proceed with cataract implant  surgery for his moderate nuclear cataracts.  The patient was given oral  discussion and printed information concerning the procedure and its possible  complications.  He signed an informed consent and arrangements made for his  outpatient admission at this time.   PAST MEDICAL HISTORY:  The patient is under the care of Dr. Theressa Millard,  his primary care physician, and Dr. Delfin Edis for coronary artery  disease. The patient has had coronary artery bypass surgery in 1989 was said  to have done well following that.  He has increased cholesterol, chronic  arthritis. The patient is married, nonsmoker, three to four drinks a week of  alcohol.  Current  medications include Vytorin, aspirin, Prevacid and Aleve.   REVIEW OF SYSTEMS:  No current cardiorespiratory complaints.   PHYSICAL EXAMINATION:  VITAL SIGNS:  As recorded on admission blood,  pressure 121/73, pulse 73, respirations 18, temperature 98.1.  GENERAL APPEARANCE:  The patient is a pleasant well-nourished, well-  developed 77 year old white male in no acute distress. HEENT:  Eyes: Visual  acuity 20/40 right eye, 20/40 minus left eye. Applanation tonometry 15 mm  each eye. Slit lamp examination, moderate nuclear cataract formation in both  eyes. Dilated fundus examination cataract, edematous, the retina is  attached. Optic nerve is sharply outlined, of good color, disk:cup ratio 0.5  right eye, 0.4 left eye.  Blood vessels and macula appears normal.  CHEST: Lungs clear to percussion and auscultation.  HEART: Normal sinus rhythm, no cardiomegaly. No murmurs.  ABDOMEN: Negative.  EXTREMITIES: Negative.   ADMISSION DIAGNOSIS:  Senile cataract, both eyes.   SURGICAL PLAN:  Cataract implant surgery, left eye now, right eye later.       HNJ/MEDQ  D:  08/26/2004  T:  08/26/2004  Job:  [id number] 

## 2011-01-02 NOTE — Op Note (Signed)
NAME:  Adam [name], Adam [name] NO.:  [id number]   MEDICAL RECORD NO.:  [id number]          PATIENT TYPE:  OIB   LOCATION:  2899                         FACILITY:  [facility]   PHYSICIAN:  Guadelupe Sabin, M.D.DATE OF BIRTH:  [birth year]   DATE OF PROCEDURE:  08/26/2004  DATE OF DISCHARGE:                                 OPERATIVE REPORT   PREOPERATIVE DIAGNOSIS:  Senile nuclear cataract, left eye.   POSTOPERATIVE DIAGNOSIS:  Senile nuclear cataract, left eye.   NAME OF OPERATION:  Planned extracapsular cataract extraction -  phacoemulsification, primary insertion of posterior chamber intraocular lens  implant.   SURGEON:  Dr. Cecilie Kicks.   ASSISTANT:  Nurse.   ANESTHESIA:  Local, 4% Xylocaine,0.75 Marcaine retrobulbar block with Wydase  added, topical tetracaine, intraocular Xylocaine.  Anesthesia stand-by  required in this patient.  The patient given sodium phenethyl intravenously  during the period of retrobulbar injection.   OPERATIVE PROCEDURE:  After the patient was prepped and draped, a lid  speculum was inserted in the left eye.  Schatz's tonometry was recorded at  10 scale units with a 5.5 gram weight.  A peritomy was performed adjacent to  the limbus from the 11 to 1 o'clock position after a superior rectus  traction suture had been placed.  The corneal scleral junction was cleaned  and a corneal scleral groove made with a 45 degree super blade.  The  anterior chamber was then entered with the 2.5 mm diamond keratome.  Using a  bent 26-gauge needle on an Ocucoat syringe, a circular capsulorrhexis was  begun and then completed with the Grabow forceps.  Hydrodissection and  hydrodelineation were performed using 1% Xylocaine.  It was noted however,  that it was very difficult or impossible to separate the posterior lens  cortex from the posterior capsule.  No fluid wave could be seen flowing  under the cataractous lens.  This was repeated but it was felt that  further  manipulation might burst the posterior capsule.  It was therefore elected to  proceed with phacoemulsification.  The 30 degree phacoemulsification tip was  then inserted.  Initially, there was sculpting of the central cortex.  Using  the [facility] pick, the lens nucleus was rotated in the capsular bag.  This was  extremely difficult and it was noted that there was a probable dehiscence in  the zonules from the 12 to 3 o'clock position.  Additional Ocucoat was  inserted to tamponade the possible vitreous presentation.  The nucleus was  fragmented with the [facility] pick and the total nucleus aspirated or  emulsified.  Attention was then paid to the residual second cortex.  The  silicone-tipped irrigation-aspiration tip was inserted with cautious  aspiration of the residual epi nucleus and cortex.  As this was being  aspirated, it was felt that there was continued zonular dialysis and this  was stopped.  It was felt that it would be better to insert a posterior  chamber implant in the ciliary sulcus rather than to proceed with vitreous  presentation or vitreous loss.  An [employer] [id number] was  then  assembled and inserted into the anterior chamber with the McDonald forceps.  It was then placed in the ciliary sulcus without complication.  There was a  central oval opening in the posterior cortex or possibly posterior capsule.  There was no vitreous however, presenting in the anterior chamber.  The  Provisc which had been using intermittently with the Ocucoat was then  aspirated.  A peripheral iridectomy was performed at the 12:30 position to  prevent postoperative glaucoma.  The operative incisions appeared to be self-  sealing.  It was however, elected to place a single 10-0 interrupted nylon  suture across the 12 o'clock incision to ensure closure and to prevent  endophthalmitis.  Miochol opthalmic solution was used after the Ocucoat and  Provisc had been aspirated to reform  the anterior chamber and to constrict  the pupil.  Maxitrol ointment was instilled in the conjunctival cul-de-sac.  The patient was given 500 mg of Diamox intravenously to prevent  postoperative elevation of intraocular pressure.  Duration of procedure;  45  minutes.  The patient tolerated the procedure well in general and left the  operating room for the recovery room in good condition.   SUMMARY:  This was the first cataract procedure performed on this 77-year-  old white male's left eye.  The procedure consisted of a standard  phacoemulsification.  Due to zonular dialysis however, it was elected to  leave some of the residual cortex rather than to have vitreous presentation  and vitreous loss.  A peripheral iridectomy was performed to prevent  postoperative elevation of intraocular pressure.  Follow-up appointment my  office four hours.       HNJ/MEDQ  D:  08/26/2004  T:  08/26/2004  Job:  [id number]

## 2011-01-02 NOTE — Discharge Summary (Signed)
NAME:  Adam [name], Adam [name] NO.:  [id number]   MEDICAL RECORD NO.:  [id number]          PATIENT TYPE:  INP   LOCATION:  6533                         FACILITY:  [facility]   PHYSICIAN:  Colleen Can. Deborah Chalk, M.D.DATE OF BIRTH:  [birth year]   DATE OF ADMISSION:  02/23/2007  DATE OF DISCHARGE:  02/26/2007                               DISCHARGE SUMMARY   FINAL DISCHARGE DIAGNOSES:  1. Chest pain with subsequent cardiac catheterization showing ejection      fraction at 60% with normal regional wall motion.  The right      coronary artery is 100% occluded.  The left main is small.  LAD is      100% occluded after the first perforating branch.  The left      circumflex intermediate had diffuse irregularities.  Left internal      mammary to the LAD is patent with excellent flow.  The saphenous      vein graft to posterior descending is patent with excellent flow.      The saphenous vein graft to the diagonal is a very smooth graft      with good distal flow.  Saphenous vein graft to left circumflex is      100% occluded.  The patient will continue with medical management.  2. New onset of atrial flutter with documented bradycardia      subsequently implanted with a Medtronic dual-chamber in rhythm      pacemaker P1501DR, serial number [id number].  3. Initiation of Coumadin anticoagulation.  4. GERD.  5. Hyperlipidemia.  6. Known atherosclerotic cardiovascular disease with previous coronary      artery bypass grafting dating back to February 1989.  7. Known abdominal aneurysm followed yearly with last measurement at      3.7 cm.   HISTORY OF PRESENT ILLNESS:  Adam [name] is a very pleasant 77 year old  white male who appears younger than his stated age.  He has had a  longstanding history of known ischemic heart disease and has down well  since previous bypass surgery in 1989.  He presented for a work-in  appointment on the morning of July 9 with complaints over the last 10  days  or so with exertional angina.  It was identical to his previous  chest pain syndrome.  He took nitroglycerin times one with subsequent  relief.  He was seen in the office on the day of admission where his EKG  showed a new onset of atrial flutter with a slow ventricular response.  He was subsequently admitted with plans for cardiac catheterization  prior to the initiation of Coumadin anticoagulation.   Please see the history and physical for further patient presentation and  profile.   LABORATORY DATA:  His EKG showed new onset atrial flutter with a slow  ventricular response.  CBC was normal.  Chemistries were normal.   HOSPITAL COURSE:  The patient was admitted from the office.  We were  able to proceed on the following afternoon of cardiac catheterization.  Those results are as noted above.  It is felt that it is  in the  patient's best interest to proceed on with medical management.  We then  decided that we would proceed on with TEE with possible cardioversion.  However, on the first night admission he remained in atrial flutter with  his rate down in the 30s.  It was felt at this point that he would need  probable permanent pacemaker implantation.   A transesophageal echocardiogram was performed by Dr. Lady [name].  There was no thrombus noted with normal LV function and no signs of  valvular heart disease.  On February 25, 2007, the patient underwent  implantation of a dual chamber Medtronic pacemaker model P1501DR in  rhythm, serial number PNP [id number].  Procedure was tolerated well with no  known complications.  Postprocedure, the patient was noted to be in  sinus rhythm.  He was transferred back to telemetry and the following  day he was doing well.  He was started on low-dose beta-blocker  His  wound was satisfactory as well as right groin, and he was felt to be a  satisfactory candidate for discharge.   DISCHARGE CONDITION:  Stable.   DISCHARGE MEDICINES:  1. Baby aspirin  daily.  2. Omeprazole 20 mg a day.  3. Zantac 150 a day.  4. We will be stopping Vytorin.  5. He is placed on generic Zocor 40 mg a day.  6. Toprol XL 25 mg a day.  7. Tylenol for discomfort.  8. Coumadin was initiated with 5 mg tablets.  He was to start half a      tablet each evening on the day of discharge.   DISCHARGE INSTRUCTIONS:  1. Diet is to be low-salt, heart-healthy.  2. Activity is to be increased as tolerated.  3. Extensive written instructions were given regarding pacemaker care,      specifically not to raise the right arm above his head for the next      2-3 weeks as well as avoid getting the wound wet.  Betadine swabs      were made available for him to paint the wound twice a day for the      following 3 days.  4. He was asked to follow up in our office for a post hospital visit      on the following Tuesday.  He was to call in the interim if any      problems arise otherwise.      Adam [name], N.P.      Colleen Can. Deborah Chalk, M.D.  Electronically Signed    LC/MEDQ  D:  04/01/2007  T:  04/01/2007  Job:  [id number]

## 2011-01-02 NOTE — H&P (Signed)
NAME:  Adam [name], Adam [name] NO.:  [id number]   MEDICAL RECORD NO.:  [id number]          PATIENT TYPE:  OIB   LOCATION:  2899                         FACILITY:  [facility]   PHYSICIAN:  Guadelupe Sabin, M.D.DATE OF BIRTH:  June 12, 1925   DATE OF ADMISSION:  08/26/2004  DATE OF DISCHARGE:  08/26/2004                                HISTORY & PHYSICAL   This was a planned outpatient surgical admission of this 77 year old white  male admitted for cataract implant surgery of the left eye.   PRESENT ILLNESS:  This patient has been followed in my office for routine  eye care since April 21, 1988. Initial examination at that time revealed  a visual acuity without correction of 20/30 right eye 20/40 left eye and a  normal ocular exam.  Over the ensuing years, the patient has had refraction  changes. Recently, however, he has noted deterioration in vision due to  cataract formation noted in 2003.  The patient recently, however, has  complained of blurred or dim vision, difficulty seeing street signs and road  signs, difficulty with television, reading, night vision, bright sunlight  vision, difficulty doing his work or hobbies, and difficulty with depth  perception.  The patient was given oral discussion and printed information  concerning the procedure and its possible complications. He signed an  informed consent, and arrangements were made for his outpatient admission at  this time   PAST MEDICAL HISTORY:  The patient is in stable general health under the  care of Dr. Theressa Millard The patient is felt to be in satisfactory  condition for the proposed surgery. Dr. Deborah Chalk is his cardiologist who also  feels he is in satisfactory condition for the proposed surgery.   CURRENT MEDICATIONS:  1.  Baby aspirin 81 mg.  2.  Zetia 10 mg.   ALLERGIES:  He is said to be allergic or intolerant to MORPHINE which causes  nausea.   He is felt by Dr. Deborah Chalk to be a low surgical risk for  the procedure under  local anesthesia. The patient is to stop his aspirin 5 days prior to the  surgery.   REVIEW OF SYSTEMS:  No cardiorespiratory complaints.   PHYSICAL EXAMINATION:  VITAL SIGNS: As recorded on admission, blood pressure  121/73, heart rate 73, respirations 18, temperature 98.1.  GENERAL APPEARANCE: The patient is a pleasant, well-nourished, well-  developed white male in no acute distress.  HEENT: Eyes: Visual acuity with best correction 20/40 right eye, 20/40 left  eye. Applanation tonometry 15 mm each eye. Slit lamp examination: The eyes  are white and clear with a clear cornea, deep and clear anterior chamber.  Nuclear cataract formation is present in both eyes.  Dilated fundus  examination shows a clear vitreous, attached retina with normal optic nerve,  blood vessels, and macula  CHEST: Lungs clear to percussion and auscultation.  HEART: Normal sinus rhythm, no cardiomegaly. No murmurs.  ABDOMEN: Negative.  EXTREMITIES: Negative.   ADMISSION DIAGNOSIS:  Senile nuclear cataract, both eyes.   SURGICAL PLAN:  Cataract implant surgery, left eye now, right eye later  as  needed.      HNJ/MEDQ  D:  10/19/2004  T:  10/19/2004  Job:  [id number]   cc:   Theressa Millard, M.D.  Include associated laboratory data, EKG,  or chest x-ray material

## 2011-01-02 NOTE — H&P (Signed)
NAME:  Adam [name], Adam [name] NO.:  [id number]   MEDICAL RECORD NO.:  [id number]          PATIENT TYPE:  OIB   LOCATION:  2899                         FACILITY:  [facility]   PHYSICIAN:  Guadelupe Sabin, M.D.DATE OF BIRTH:  12/31/1924   DATE OF ADMISSION:  08/26/2004  DATE OF DISCHARGE:                                HISTORY & PHYSICAL   This was a planned outpatient surgical admission of this 77 year old white  male admitted for cataract implant surgery of the left eye.   PRESENT ILLNESS:  This patient has been followed in my office for routine  eye care since April 21, 1988.  Examination at that time revealed a  visual acuity without correction of 20/30 right eye, 20/40 left eye.  Examination was essentially normal with presbyopia.  The patient given  glasses for refractive correction.  In 1998 the patient was seen for a few  vitreous floaters but felt to be normal. No retinal tears or detachment  areas were seen.  Cataract formation was noted in April 2004 and this has  progressed until the point where it was affecting his vision. The patient  stated he could no longer follow his golf ball, was having difficulty with  night driving and small print.  He elected to proceed with cataract implant  surgery for his moderate nuclear cataracts.  The patient was given oral  discussion and printed information concerning the procedure and its possible  complications.  He signed an informed consent and arrangements made for his  outpatient admission at this time.   PAST MEDICAL HISTORY:  The patient is under the care of Dr. Theressa Millard,  his primary care physician, and Dr. Delfin Edis for coronary artery  disease. The patient has had coronary artery bypass surgery in 1989 was said  to have done well following that.  He has increased cholesterol, chronic  arthritis. The patient is married, nonsmoker, three to four drinks a week of  alcohol.  Current medications include  Vytorin, aspirin, Prevacid and Aleve.   REVIEW OF SYSTEMS:  No current cardiorespiratory complaints.   PHYSICAL EXAMINATION:  VITAL SIGNS:  As recorded on admission blood,  pressure 121/73, pulse 73, respirations 18, temperature 98.1.  GENERAL APPEARANCE:  The patient is a pleasant well-nourished, well-  developed 77 year old white male in no acute distress. HEENT:  Eyes: Visual  acuity 20/40 right eye, 20/40 minus left eye. Applanation tonometry 15 mm  each eye. Slit lamp examination, moderate nuclear cataract formation in both  eyes. Dilated fundus examination cataract, edematous, the retina is  attached. Optic nerve is sharply outlined, of good color, disk:cup ratio 0.5  right eye, 0.4 left eye.  Blood vessels and macula appears normal.  CHEST: Lungs clear to percussion and auscultation.  HEART: Normal sinus rhythm, no cardiomegaly. No murmurs.  ABDOMEN: Negative.  EXTREMITIES: Negative.   ADMISSION DIAGNOSIS:  Senile cataract, both eyes.   SURGICAL PLAN:  Cataract implant surgery, left eye now, right eye later.       HNJ/MEDQ  D:  08/26/2004  T:  08/26/2004  Job:  [id number]

## 2011-01-08 ENCOUNTER — Encounter: Payer: Medicare Other | Admitting: *Deleted

## 2011-01-09 ENCOUNTER — Encounter: Payer: Self-pay | Admitting: *Deleted

## 2011-01-21 ENCOUNTER — Ambulatory Visit (INDEPENDENT_AMBULATORY_CARE_PROVIDER_SITE_OTHER): Payer: Medicare Other | Admitting: *Deleted

## 2011-01-21 ENCOUNTER — Other Ambulatory Visit: Payer: Self-pay

## 2011-01-21 DIAGNOSIS — I4891 Unspecified atrial fibrillation: Secondary | ICD-10-CM

## 2011-01-21 DIAGNOSIS — Z950 Presence of cardiac pacemaker: Secondary | ICD-10-CM

## 2011-01-23 ENCOUNTER — Encounter: Payer: Medicare Other | Admitting: *Deleted

## 2011-01-23 ENCOUNTER — Ambulatory Visit: Payer: Self-pay | Admitting: *Deleted

## 2011-01-23 NOTE — Progress Notes (Signed)
Pt now a pt at [facility] office and he will get his coumadin checked there.

## 2011-01-28 NOTE — Progress Notes (Signed)
Pacer remote check  

## 2011-01-30 ENCOUNTER — Encounter: Payer: Self-pay | Admitting: *Deleted

## 2011-02-12 ENCOUNTER — Other Ambulatory Visit: Payer: Self-pay | Admitting: Cardiology

## 2011-02-12 MED ORDER — DIGOXIN 125 MCG PO TABS: 125.0000 ug | ORAL_TABLET | Freq: Every day | ORAL | Status: DC

## 2011-03-16 NOTE — Cardiovascular Report (Signed)
  NAME:  TOBY, BREITHAUPT NO.:  [id number]  MEDICAL RECORD NO.:  [id number]           PATIENT TYPE:  O  LOCATION:  [facility]                         FACILITY:  [facility]  PHYSICIAN:  Colleen Can. Deborah Chalk, M.D.DATE OF BIRTH:  07-Jun-1925  DATE OF PROCEDURE: DATE OF DISCHARGE:  12/31/2010                           CARDIAC CATHETERIZATION   PROCEDURE:  Cardioversion.  INDICATIONS:  Atrial fibrillation.  ANESTHESIA:  Maren Beach, MD  Anesthesia was administered with 35 mg of propofol.  Using anterior- posterior pads, 120-watt seconds of synchronized biphasic energy was delivered with conversion to normal sinus rhythm.  The pacer was interrogated and returned to DDDR pacing function appropriately.     Colleen Can. Deborah Chalk, M.D.     SNT/MEDQ  D:  12/31/2010  T:  12/31/2010  Job:  [id number]  cc:   Lyn Records, M.D.  Electronically Signed by Roger Shelter M.D. on 03/16/2011 11:14:52 AM

## 2011-04-03 ENCOUNTER — Encounter: Payer: Self-pay | Admitting: Vascular Surgery

## 2011-04-30 ENCOUNTER — Encounter: Payer: Self-pay | Admitting: Vascular Surgery

## 2011-04-30 ENCOUNTER — Ambulatory Visit (INDEPENDENT_AMBULATORY_CARE_PROVIDER_SITE_OTHER): Payer: Medicare Other | Admitting: *Deleted

## 2011-04-30 ENCOUNTER — Other Ambulatory Visit: Payer: Self-pay | Admitting: Internal Medicine

## 2011-04-30 ENCOUNTER — Encounter: Payer: Self-pay | Admitting: Internal Medicine

## 2011-04-30 DIAGNOSIS — I4891 Unspecified atrial fibrillation: Secondary | ICD-10-CM

## 2011-05-01 ENCOUNTER — Ambulatory Visit (INDEPENDENT_AMBULATORY_CARE_PROVIDER_SITE_OTHER): Payer: Medicare Other | Admitting: Vascular Surgery

## 2011-05-01 ENCOUNTER — Encounter: Payer: Self-pay | Admitting: Vascular Surgery

## 2011-05-01 VITALS — BP 115/71 | HR 85 | Resp 20 | Ht 68.0 in | Wt 154.0 lb

## 2011-05-01 DIAGNOSIS — I714 Abdominal aortic aneurysm, without rupture, unspecified: Secondary | ICD-10-CM

## 2011-05-01 NOTE — Progress Notes (Signed)
[facility] [place]  Referred by: Dr. Eldridge Dace  Reason for referral: AAA  History of Present Illness  The patient is a 77 y.o. male who presents with chief complaint: increased size in aneurysm.  Previous studies demonstrate an AAA, measuring 4.3 cm on transverse view and now is listed as 4.8 cm.  The patient does not have back or abdominal pain.  He occasionally has pelvic/lower quadrant pain which is sharp  The patient does not have history of embolic episodes from the AAA.  The patient's risk factors for AAA included: age and sex.  The patient quit smoke cigarettes in 1950.  He has no family history of connective tissue disorders or aneurysmal disease.  Past Medical History  Diagnosis Date  . CAD (coronary artery disease)   . S/P CABG (coronary artery bypass graft)   . Hyperlipidemia   . GERD (gastroesophageal reflux disease)   . Presence of permanent cardiac pacemaker   . PAF (paroxysmal atrial fibrillation)     Cardioversion June 2011  . Chronic anticoagulation   . LBP (low back pain)   . AAA (abdominal aortic aneurysm)   . Diverticulosis   . Arthritis     Past Surgical History  Procedure Date  . Coronary artery bypass graft Feb 1989  . Permanent pacemaker July 2008  . Tonsillectomy   . Cataract extraction   . Joint replacement 1995 & 1996    Bilateral total knee replacements    History   Social History  . Marital Status: Married    Spouse Name: N/A    Number of Children: N/A  . Years of Education: N/A   Occupational History  . Not on file.   Social History Main Topics  . Smoking status: Former Smoker    Types: Cigarettes    Quit date: 08/17/1948  . Smokeless tobacco: Never Used  . Alcohol Use: No  . Drug Use: No  . Sexually Active: Not on file   Other Topics Concern  . Not on file   Social History Narrative  . No narrative on file    Family History  Problem Relation Age of Onset  . Stroke Mother   . Heart failure Mother     . Heart attack Father   . Heart disease Father   . Cancer Sister     breast  . Other Sister     DJD  . Heart disease Sister   . Heart disease Sister     Current outpatient prescriptions:acetaminophen (TYLENOL) 650 MG CR tablet, Take 1,300 mg by mouth daily.  , Disp: , Rfl: ;  amiodarone (PACERONE) 200 MG tablet, Take 200 mg by mouth 2 (two) times daily.  , Disp: , Rfl: ;  amLODipine (NORVASC) 5 MG tablet, Take 5 mg by mouth daily.  , Disp: , Rfl: ;  atorvastatin (LIPITOR) 10 MG tablet, Take 10 mg by mouth daily.  , Disp: , Rfl:  furosemide (LASIX) 20 MG tablet, Take 20 mg by mouth daily.  , Disp: , Rfl: ;  Glucosamine-Chondroit-Vit C-Mn (GLUCOSAMINE 1500 COMPLEX) CAPS, Take by mouth daily.  , Disp: , Rfl: ;  isosorbide mononitrate (IMDUR) 30 MG 24 hr tablet, Take 30 mg by mouth daily.  , Disp: , Rfl: ;  metoprolol tartrate (LOPRESSOR) 25 MG tablet, Take 25 mg by mouth 2 (two) times daily.  , Disp: , Rfl:  Omega-3 Fatty Acids (FISH OIL) 1000 MG CPDR, Take by mouth daily.  , Disp: , Rfl: ;  omeprazole (PRILOSEC) 20 MG capsule, Take 20 mg by mouth daily.  , Disp: , Rfl: ;  silodosin (RAPAFLO) 4 MG CAPS capsule, Take 4 mg by mouth at bedtime.  , Disp: , Rfl: ;  VITAMIN D, CHOLECALCIFEROL, PO, Take 2,000 Int'l Units by mouth daily.  , Disp: , Rfl: ;  WARFARIN SODIUM PO, Take by mouth. As directed , Disp: , Rfl:  digoxin (LANOXIN) 0.125 MG tablet, Take 1 tablet (125 mcg total) by mouth daily., Disp: 90 tablet, Rfl: 3  Allergies as of 05/01/2011 - Review Complete 05/01/2011  Allergen Reaction Noted  . Morphine  07/07/2010    Review of Systems (Positive items in bold and italic, otherwise negative)  General: Weight loss, Weight gain, Loss of appetite, Fever  Neurologic: Dizziness, Blackouts, Headaches, Seizure  Ear/Nose/Throat: Change in eyesight, Change in hearing, Nose bleeds, Sore throat  Vascular: Pain in legs with walking, Pain in feet while lying flat, Non-healing ulcer, Stroke, "Mini  stroke", Slurred speech, Temporary blindness, Blood clot in vein, Phlebitis  Pulmonary: Home oxygen, Productive cough, Bronchitis, Coughing up blood, Asthma, Wheezing  Musculoskeletal: Arthritis, Joint pain, Muscle pain  Cardiac: Chest pain, Chest tightness/pressure, Shortness of breath when lying flat, Shortness of breath with exertion, Palpitations, Heart murmur, Arrythmia, Atrial fibrillation  Hematologic: Bleeding problems, Clotting disorder, Anemia  Psychiatric:  Depression, Anxiety, Attention deficit disorder  Gastrointestinal:  Black stool, Blood in stool, Peptic ulcer disease, Reflux, Hiatal hernia, Trouble swallowing, Diarrhea, Constipation  Urinary:  Kidney disease, Burning with urination, Frequent urination, Difficulty urinating  Skin: Ulcers, Rashes  Physical Examination  Filed Vitals:   05/01/11 1308  BP: 115/71  Pulse: 85  Resp: 20    General: A&O x 3, WDWN, thin  Head: [place]/AT, mild Temporalis wasting   Ear/Nose/Throat: Hearing grossly intact, nares w/o erythema or drainage, oropharynx w/o Erythema/Exudate  Eyes: PERRLA, EOMI  Neck: Supple, no nuchal rigidity, no palpable LAD  Pulmonary: Sym exp, good air movt, CTAB, no rales, rhonchi, & wheezing  Cardiac: RRR, Nl S1, S2, no Murmurs, rubs or gallops  Vascular: Vessel Right Left  Radial Palpable Palpable  [place] Palpable Palpable  Brachial Palpable Palpable  Carotid Palpable, without bruit Palpable, without bruit  Aorta Palpable N/A  Femoral Palpable Palpable  Popliteal Non-palpable Non-palpable  PT Palpable Palpable  DP Palpable Palpable   Gastrointestinal: soft, NTND, -G/R, - HSM, - masses, - CVAT B, palpable aorta in left upper quad., no TTP  Musculoskeletal: M/S 5/5 throughout , Extremities without ischemic changes , B lipodermatosclerosis  Neurologic: CN 2-12 intact , Pain and light touch intact in extremities , Motor exam as listed above  Psychiatric: Judgment intact, Mood & affect  appropriate for pt's clinical situation  Dermatologic: See M/S exam for extremity exam, no rashes otherwise noted  Lymph : No Cervical, Axillary, or Inguinal lymphadenopathy   Outside Studies/Documentation 10 pages of outside documents were reviewed including: serial abdominal ultrasound.  Medical Decision Making  The patient is a 77 y.o. male who presents with: asx AAA.   Based on this patient's exam and diagnostic studies, he needs CTA Chest/abd/pelvis to r/o TAA and eval anatomy of the AAA and see if he is a candidate for repair in the future.  The threshold for repair is AAA size > 5.5 cm, growth > 1 cm/yr, and symptomatic status.  The patient will follow up in 4 weeks with the previous noted studies.  I emphasized the importance of maximal medical management including strict control of blood pressure, blood glucose,  and lipid levels, obtaining regular exercise, and cessation of smoking.    Thank you for allowing [place] to participate in this patient's care.  Leonides Sake, MD [facility] [place] Office: [phone] Pager: [phone]

## 2011-05-03 LAB — REMOTE PACEMAKER DEVICE
AL IMPEDENCE PM: 416 Ohm
BAMS-0001: 165 {beats}/min
BATTERY VOLTAGE: 2.97 V

## 2011-05-04 ENCOUNTER — Other Ambulatory Visit: Payer: Self-pay | Admitting: Vascular Surgery

## 2011-05-04 LAB — CREATININE, SERUM: Creat: 1.03 mg/dL (ref 0.50–1.35)

## 2011-05-04 LAB — BUN: BUN: 15 mg/dL (ref 6–23)

## 2011-05-09 ENCOUNTER — Observation Stay (HOSPITAL_COMMUNITY)
Admission: EM | Admit: 2011-05-09 | Discharge: 2011-05-10 | Disposition: A | Discharge status: Home / Self Care | Payer: Medicare Other | Attending: Interventional Cardiology | Admitting: Interventional Cardiology

## 2011-05-09 DIAGNOSIS — I714 Abdominal aortic aneurysm, without rupture, unspecified: Secondary | ICD-10-CM | POA: Insufficient documentation

## 2011-05-09 DIAGNOSIS — R079 Chest pain, unspecified: Secondary | ICD-10-CM

## 2011-05-09 DIAGNOSIS — I4891 Unspecified atrial fibrillation: Secondary | ICD-10-CM | POA: Insufficient documentation

## 2011-05-09 DIAGNOSIS — R51 Headache: Secondary | ICD-10-CM | POA: Insufficient documentation

## 2011-05-09 DIAGNOSIS — I2510 Atherosclerotic heart disease of native coronary artery without angina pectoris: Secondary | ICD-10-CM | POA: Insufficient documentation

## 2011-05-09 DIAGNOSIS — E785 Hyperlipidemia, unspecified: Secondary | ICD-10-CM | POA: Insufficient documentation

## 2011-05-09 DIAGNOSIS — R5381 Other malaise: Secondary | ICD-10-CM | POA: Insufficient documentation

## 2011-05-09 DIAGNOSIS — R5383 Other fatigue: Secondary | ICD-10-CM | POA: Insufficient documentation

## 2011-05-09 DIAGNOSIS — Z7901 Long term (current) use of anticoagulants: Secondary | ICD-10-CM | POA: Insufficient documentation

## 2011-05-09 DIAGNOSIS — R0789 Other chest pain: Principal | ICD-10-CM | POA: Insufficient documentation

## 2011-05-09 LAB — DIFFERENTIAL
Basophils Relative: 0 % (ref 0–1)
Eosinophils Absolute: 0.1 10*3/uL (ref 0.0–0.7)
Eosinophils Relative: 1 % (ref 0–5)
Lymphs Abs: 1.9 10*3/uL (ref 0.7–4.0)

## 2011-05-09 LAB — POCT I-STAT, CHEM 8
BUN: 21 mg/dL (ref 6–23)
Calcium, Ion: 1.18 mmol/L (ref 1.12–1.32)
Chloride: 102 mEq/L (ref 96–112)
Creatinine, Ser: 0.9 mg/dL (ref 0.50–1.35)
Glucose, Bld: 130 mg/dL — ABNORMAL HIGH (ref 70–99)
HCT: 40 % (ref 39.0–52.0)
Hemoglobin: 13.6 g/dL (ref 13.0–17.0)
Potassium: 4.3 meq/L (ref 3.5–5.1)
Sodium: 139 meq/L (ref 135–145)
TCO2: 27 mmol/L (ref 0–100)

## 2011-05-09 LAB — POCT I-STAT TROPONIN I: Troponin i, poc: 0.05 ng/mL (ref 0.00–0.08)

## 2011-05-09 LAB — PROTIME-INR: Prothrombin Time: 30.2 seconds — ABNORMAL HIGH (ref 11.6–15.2)

## 2011-05-10 ENCOUNTER — Emergency Department (HOSPITAL_COMMUNITY): Payer: Medicare Other

## 2011-05-10 LAB — BASIC METABOLIC PANEL
Calcium: 9.3 mg/dL (ref 8.4–10.5)
GFR calc non Af Amer: >60 mL/min (ref >60)
Sodium: 140 mEq/L (ref 135–145)

## 2011-05-10 LAB — CARDIAC PANEL(CRET KIN+CKTOT+MB+TROPI)
CK, MB: 2.3 ng/mL (ref 0.3–4.0)
Relative Index: INVALID (ref 0.0–2.5)
Total CK: 43 U/L (ref 7–232)

## 2011-05-10 LAB — CBC
MCH: 34 pg (ref 26.0–34.0)
MCV: 97.1 fL (ref 78.0–100.0)
Platelets: 119 10*3/uL — ABNORMAL LOW (ref 150–400)
Platelets: 124 10*3/uL — ABNORMAL LOW (ref 150–400)
RDW: 13.3 % (ref 11.5–15.5)
RDW: 13.3 % (ref 11.5–15.5)
WBC: 8.2 10*3/uL (ref 4.0–10.5)
WBC: 9.1 10*3/uL (ref 4.0–10.5)

## 2011-05-10 LAB — DIFFERENTIAL
Basophils Absolute: 0 10*3/uL (ref 0.0–0.1)
Eosinophils Absolute: 0.1 10*3/uL (ref 0.0–0.7)
Eosinophils Relative: 1 % (ref 0–5)
Lymphocytes Relative: 24 % (ref 12–46)

## 2011-05-10 LAB — PROTIME-INR: INR: 2.72 — ABNORMAL HIGH (ref 0.00–1.49)

## 2011-05-14 ENCOUNTER — Encounter: Payer: Self-pay | Admitting: *Deleted

## 2011-05-15 NOTE — Progress Notes (Signed)
Pacer checked by remote 

## 2011-05-20 NOTE — Discharge Summary (Signed)
  NAME:  Adam [name], Adam [name] NO.:  [id number]  MEDICAL RECORD NO.:  [id number]  LOCATION:  3703                         FACILITY:  [facility]  PHYSICIAN:  Georga Hacking, M.D.DATE OF BIRTH:  [birth year]  DATE OF ADMISSION:  05/09/2011 DATE OF DISCHARGE:  05/10/2011                              DISCHARGE SUMMARY   FINAL DIAGNOSES: 1. Atypical chest pain likely noncardiac. 2. Headache, resolved. 3. Malaise and fatigue of uncertain cause. 4. Coronary artery disease with previous bypass grafting with vein     graft to circumflex known to be occluded at previous     catheterization. 5. Hyperlipidemia. 6. Irritable bowel syndrome. 7. Atrial fibrillation. 8. Abdominal aortic aneurysm. 9. Long-term anticoagulation with warfarin.  HISTORY:  This 77 year old male has had recurrent atrial fibrillation and has not felt well in 7 weeks since he has been recurrent atrial fibrillation.  He was placed on amiodarone 200 mg twice daily, but this week that has been reduced to 200 mg because of complaints similar to what he came in with.  He reported chest tightness, a hot feeling, throbbing pain and palpitations, abdominal pain and nausea.  His wife called and had him brought to the emergency room and he is brought in at this time to rule out an MI.  See the previously dictated history and physical for remainder of the details.  HOSPITAL COURSE:  Chest x-ray showed normal heart size of no acute process noted.  Laboratory data showed a normal CBC.  Protime INR was 2.83.  Chemistry panel was normal except for mild elevated glucose. Troponin was less than 0.3 on two occasions.  EKG showed a paced rhythm.  The patient was brought into the hospital and myocardial infarction was ruled out.  He felt fine the next morning and wished to go home and we will stop his amiodarone and have  Dr. Katrinka Blazing follow him up as an outpatient.  He is discharged at this time in improved condition  on amlodipine 5 mg daily, fish oil 1000 mg in the morning, furosemide 20 mg daily, glucosamine daily, isosorbide mononitrate 30 mg daily, Lipitor 10 mg daily, metoprolol 25 b.i.d., omeprazole 20 mg daily, warfarin per his home dose, Tylenol as needed, vitamin D3 as needed, Rapaflo 4 mg at bedtime.  He is to follow up Dr. Katrinka Blazing this week for his regular appointment, is to report further problems.     Georga Hacking, M.D.     WST/MEDQ  D:  05/10/2011  T:  05/10/2011  Job:  [id number]  cc:   Lyn Records, M.D.  Electronically Signed by Lacretia Nicks. Donnie Aho M.D. on 05/20/2011 12:37:05 PM

## 2011-05-21 ENCOUNTER — Encounter: Payer: Self-pay | Admitting: Vascular Surgery

## 2011-05-22 ENCOUNTER — Ambulatory Visit (HOSPITAL_COMMUNITY)
Admission: RE | Admit: 2011-05-22 | Discharge: 2011-05-22 | Disposition: A | Discharge status: Home / Self Care | Payer: Medicare Other | Source: Ambulatory Visit | Attending: Interventional Cardiology | Admitting: Interventional Cardiology

## 2011-05-22 DIAGNOSIS — I4891 Unspecified atrial fibrillation: Secondary | ICD-10-CM | POA: Insufficient documentation

## 2011-05-22 DIAGNOSIS — Z950 Presence of cardiac pacemaker: Secondary | ICD-10-CM | POA: Insufficient documentation

## 2011-05-22 DIAGNOSIS — I10 Essential (primary) hypertension: Secondary | ICD-10-CM | POA: Insufficient documentation

## 2011-05-22 DIAGNOSIS — I2510 Atherosclerotic heart disease of native coronary artery without angina pectoris: Secondary | ICD-10-CM | POA: Insufficient documentation

## 2011-05-22 LAB — PROTIME-INR: Prothrombin Time: 26.7 seconds — ABNORMAL HIGH (ref 11.6–15.2)

## 2011-05-28 ENCOUNTER — Encounter: Payer: Self-pay | Admitting: Vascular Surgery

## 2011-05-28 NOTE — H&P (Signed)
NAME:  Adam [name], Adam [name] NO.:  [id number]  MEDICAL RECORD NO.:  [id number]  LOCATION:  3703                         FACILITY:  [facility]  PHYSICIAN:  Rollene Rotunda, MD, FACCDATE OF BIRTH:  [birth year]  DATE OF ADMISSION:  05/09/2011 DATE OF DISCHARGE:                             HISTORY & PHYSICAL   CARDIOLOGIST:  Lyn Records, M.D.  REASON FOR PRESENTATION:  Evaluate the patient with headache and chest pain.  HISTORY OF PRESENT ILLNESS:  This patient is an 77 year old white gentleman with a long history of coronary disease, status post CABG.  He is also had atrial fibrillation.  He has had cardioversion earlier this year.  Apparently, he has been back in atrial fibrillation for about 7 weeks.  He says he does not feel well with this.  He does not have his usual exercise tolerance.  He has been treated with amiodarone. However, he does not think he is tolerating this.  Last week, he had symptoms similar to tonight and he saw Dr. Katrinka Blazing on Wednesday and had his dose of amiodarone reduced.  However, today, he was weak.  This evening, he had a pounding in his head.  He had a headache.  He felt some chest tightness.  He did not describe arm or neck discomfort.  He had difficulty getting his breath and feeling like he could not take a deep breath.  He could not sleep.  He thought his heart was racing.  He was nauseated and had abdominal pain.  He was afraid to take his amiodarone and actually only took half a dose.  In the emergency room, he had paced ventricular rhythm.  Troponin was negative x1.  Chest x-ray is not yet done.  He is actually oxygenating well and feels fine.  PAST MEDICAL HISTORY:  CABG (last catheterization in 2008 with LIMA to LAD, SVG to circumflex occluded, SVG to diagonal patent, SVG to PDA patent,  left main normal, LAD occluded, right coronary artery occluded, circumflex 70-80% ostial stenosis.  Stress perfusion study in 2010), atrial  fibrillation, abdominal aortic aneurysm, hyperlipidemia, irritable bowel syndrome, osteoarthritis.  PAST SURGICAL HISTORY:  CABG in 1989, pacemaker, tonsillectomy, cataract surgery, bilateral knee surgery.  SOCIAL HISTORY:  The patient is married.  Has not smoked cigarettes since 1950.  FAMILY HISTORY:  Contributory for his father who is having heart disease in his 8s.  He also has sisters with heart disease and mother who had heart failure.  REVIEW OF SYSTEMS:  As stated in the HPI and negative for all other systems.  PHYSICAL EXAMINATION:  GENERAL:  The patient is in no distress.  He is well appearing. VITAL SIGNS:  Blood pressure 131/71, heart rate 76 and regular, respiratory rate 19, afebrile. HEENT:  Eyes are unremarkable; pupils are equal, round and reactive to light; fundi not visualized.  Oral mucosa unremarkable. NECK:  No jugular vein distention at 45 degrees.  Carotid upstroke is brisk and symmetrical.  No bruits, thyromegaly. LYMPHATICS:  No cervical, axillary, or inguinal adenopathy. LUNGS:  Clear to auscultation bilaterally. BACK:  No costovertebral angle tenderness. CHEST:  Well-healed sternotomy scar and right upper pacemaker. HEART:  PMI not displaced or sustained,  S1 and S2 within normal limits, no S3, no S4, no clicks, no rubs, no murmurs.  ABDOMEN:  Positive bowel sounds, normal in frequency and pitch, no bruits, no rebound, no guarding, no midline pulsatile mass.  No hepatomegaly, no splenomegaly. SKIN:  No rashes, no nodules. EXTREMITIES:  2+ pulses throughout, mild bilateral ankle edema, mild chronic venous stasis changes. NEURO:  Oriented to person, place and time, cranial nerves II through XII grossly intact, motor grossly intact.  EKG; ventricular paced rhythm, probable underlying atrial fibrillation, no identifiable P-waves.  Labs; sodium 139, potassium 4.3, BUN 21, creatinine 0.9, hemoglobin 13.6, troponin 0.05.  ASSESSMENT/PLAN: 1. Chest: The  patient has some chest discomfort but many other somatic     complaints as well.  At this point, I do not have a strong     suspicion for an acute coronary syndrome or unstable angina.     Rather I suspect he is intolerant to the amiodarone.  I will     discontinue this medication.  As I am not suspecting that he will     need an invasive evaluation, I will continue his Coumadin.  I will     cycle cardiac enzymes.  If he is comfortable and having no further     complaints, without EKG changes, he might be considered for     outpatient stress testing. 2. Atrial fibrillation.  I will discontinue the amiodarone as above.     Long-term plan will be per Dr. Katrinka Blazing.  He would not be able to     start another antiarrhythmic until after an adequate wash out. 3. Dyspnea.  He did complain of this.  However, I see no objective     evidence of heart failure.  Chest x-ray is pending.  I will check a     pro BNP.  He will continue on the meds as listed. 4. Dyslipidemia.  I will continue his current statin. 5. Abdominal aortic aneurysm.  I reviewed the note by Dr. Imogene Burn and he     is having close followup for this.     Rollene Rotunda, MD, [facility]     JH/MEDQ  D:  05/10/2011  T:  05/10/2011  Job:  [id number]  Electronically Signed by Rollene Rotunda MD [facility] on 05/28/2011 01:38:12 PM

## 2011-05-28 NOTE — Cardiovascular Report (Signed)
  NAME:  Adam [name], MACMURRAY NO.:  [id number]  MEDICAL RECORD NO.:  [id number]  LOCATION:  [facility]                         FACILITY:  [facility]  PHYSICIAN:  Lyn Records, M.D.   DATE OF BIRTH:  1925-02-21  DATE OF PROCEDURE:  05/22/2011 DATE OF DISCHARGE:  05/22/2011                           CARDIAC CATHETERIZATION   INDICATIONS:  Atrial fibrillation.  PROCEDURE PERFORMED:  Elective electrical cardioversion.  DESCRIPTION:  Dr. Bedelia Person from the Anesthesia Service supervised sedation.  He was given 80 mg of propofol and 40 mg of lidocaine.  After a sleet with an anterior and posterior electrode orientation, a single discharge of 200 joules led to reversion to sinus rhythm.  The pacemaker was reprogramed after the procedure and did demonstrate AV sequential pacing.  No complications occurred.  The patient awakened from sedation without residua.  CONCLUSION:  Successful conversion from V pacing to AV sequential pacing and resolution of atrial fibrillation.  PLAN:  Office followup in 5-7 days for pacemaker interrogation and EKG.     Lyn Records, M.D.     HWS/MEDQ  D:  05/22/2011  T:  05/22/2011  Job:  [id number]  Electronically Signed by Verdis Prime M.D. on 05/28/2011 11:03:41 AM

## 2011-05-29 ENCOUNTER — Ambulatory Visit
Admission: RE | Admit: 2011-05-29 | Discharge: 2011-05-29 | Disposition: A | Discharge status: Home / Self Care | Payer: Medicare Other | Source: Ambulatory Visit | Attending: Vascular Surgery | Admitting: Vascular Surgery

## 2011-05-29 ENCOUNTER — Ambulatory Visit (INDEPENDENT_AMBULATORY_CARE_PROVIDER_SITE_OTHER): Payer: Medicare Other | Admitting: Vascular Surgery

## 2011-05-29 ENCOUNTER — Encounter: Payer: Self-pay | Admitting: Vascular Surgery

## 2011-05-29 ENCOUNTER — Ambulatory Visit: Payer: Medicare Other | Admitting: Vascular Surgery

## 2011-05-29 VITALS — BP 113/70 | HR 94 | Resp 14 | Ht 68.0 in | Wt 153.0 lb

## 2011-05-29 DIAGNOSIS — I714 Abdominal aortic aneurysm, without rupture, unspecified: Secondary | ICD-10-CM

## 2011-05-29 MED ORDER — IOHEXOL 350 MG/ML SOLN
100.0000 mL | Freq: Once | INTRAVENOUS | Status: AC | PRN
  Administered 2011-05-29: 100 mL via INTRAVENOUS

## 2011-05-29 NOTE — Progress Notes (Signed)
[facility] [place]  Established Abdominal Aortic Aneurysm  History of Present Illness  The patient is a 77 y.o. male who presents with chief complaint: follow up for AAA.  He recently had a CTA Chest, Abd, and pelvis completed which demonstrated: an AAA, measuring 4.9 cm.  The patient notes intermittent vague abdominal pain and occasional sharp back pain.    The patient's PMH, PSH, SH, FamHx, Med, Allergies and ROS are unchanged from 05/01/11.  Physical Examination  Filed Vitals:   05/29/11 0947  BP: 113/70  Pulse: 94  Resp: 14  Height: 5\' 8"  (1.727 m)  Weight: 153 lb (69.4 kg)  SpO2: 99%    General: A&O x 3, WDWN  Pulmonary: Sym exp, good air movt, CTAB, no rales, rhonchi, & wheezing  Cardiac: RRR, Nl S1, S2, no Murmurs, rubs or gallops  Vascular:  Vessel  Right  Left   Radial  Palpable  Palpable   [place]  Palpable  Palpable   Brachial  Palpable  Palpable   Carotid  Palpable, without bruit  Palpable, without bruit   Aorta  Palpable  N/A   Femoral  Palpable  Palpable   Popliteal  Non-palpable  Non-palpable   PT  Palpable  Palpable   DP  Palpable  Palpable    Gastrointestinal: soft, NTND, -G/R, - HSM, - masses, - CVAT B, palpable aorta in left upper quad., no TTP   Musculoskeletal: M/S 5/5 throughout , Extremities without ischemic changes , B lipodermatosclerosis   CTA Chest/Abd/Pelvis (05/29/11)   No evidence of thoracic aortic aneurysmal disease.  Infrarenal abdominal aortic aneurysm as above which is assess  fusiform has a somewhat left-sided saccular configuration.   Early bifurcation of right renal artery as above.   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   Based on my review of the Chest/Abd/Pelvis CTA: the patient has no thoracic aneurysmal disease.  His AAA is saccular with adequate geometry for a Gore Excluder repair and excellent iliac arteries for a possible percutaneous technique.  Medical Decision Making  The patient  is a 77 y.o. male who presents with: large asx AAA.   Based on this patient's exam and diagnostic studies, he needs cardiac preoperative optimization and risk stratification.  The threshold for repair is AAA size > 5.5 cm, growth > 1 cm/yr, and symptomatic status  Based on growth of 0.5 cm/6 month, he meets criteria for repair.  However, he is having active cardiac issues in regards to his atrial fibrillation.  As I don't think this patient's abd and back sx are related to this AAA, I don't think his AAA needs to be repaired immediately, but I recommend repair in the next 1-2 months after he sees his cardiologist for further optimization.  The patient will follow up next month after seeing his cardiologist, and we will make final plans in regards to a possible EVAR.    Meanwhile, I'll have my Gore rep review the CTA and order the pieces for his EVAR in case emergency repair is necessary.  I emphasized the importance of maximal medical management including strict control of blood pressure, blood glucose, and lipid levels, obtaining regular exercise, and cessation of smoking.    Thank you for allowing [place] to participate in this patient's care.  Leonides Sake, MD [facility] [place] Office: [phone] Pager: [phone]

## 2011-06-02 LAB — BASIC METABOLIC PANEL
BUN: 13
Chloride: 100
Creatinine, Ser: 0.79
GFR calc non Af Amer: >60

## 2011-06-02 LAB — PROTIME-INR: Prothrombin Time: 14.4

## 2011-06-02 LAB — CBC
Hemoglobin: 11.5 — ABNORMAL LOW
MCHC: 33.9
MCHC: 34.1
MCV: 100.2 — ABNORMAL HIGH
MCV: 100.9 — ABNORMAL HIGH
MCV: 101.4 — ABNORMAL HIGH
Platelets: 131 — ABNORMAL LOW
Platelets: 138 — ABNORMAL LOW
RBC: 3.34 — ABNORMAL LOW
RDW: 13.5
WBC: 7.1
WBC: 8

## 2011-06-02 LAB — HEPARIN LEVEL (UNFRACTIONATED)
Heparin Unfractionated: 0.59
Heparin Unfractionated: <0.1 — ABNORMAL LOW

## 2011-07-16 ENCOUNTER — Encounter: Payer: Self-pay | Admitting: Vascular Surgery

## 2011-07-17 ENCOUNTER — Encounter: Payer: Self-pay | Admitting: Vascular Surgery

## 2011-07-17 ENCOUNTER — Ambulatory Visit (INDEPENDENT_AMBULATORY_CARE_PROVIDER_SITE_OTHER): Payer: Medicare Other | Admitting: Vascular Surgery

## 2011-07-17 ENCOUNTER — Other Ambulatory Visit (INDEPENDENT_AMBULATORY_CARE_PROVIDER_SITE_OTHER): Payer: Medicare Other | Admitting: *Deleted

## 2011-07-17 VITALS — BP 106/69 | HR 87 | Resp 16 | Ht 68.0 in | Wt 155.0 lb

## 2011-07-17 DIAGNOSIS — I714 Abdominal aortic aneurysm, without rupture, unspecified: Secondary | ICD-10-CM

## 2011-07-17 DIAGNOSIS — Z48812 Encounter for surgical aftercare following surgery on the circulatory system: Secondary | ICD-10-CM

## 2011-07-17 NOTE — Progress Notes (Addendum)
[facility] [place]  Established Abdominal Aortic Aneurysm  History of Present Illness  The patient is a 77 y.o. male who presents with chief complaint: follow up from Cardiology preop for EVAR.  Previous studies demonstrate an AAA, measuring 4.9 cm.  The patient does have intermittent abdominal pain.  The patient was cleared for surgery by his cardiologist recently.  Past Medical History  Diagnosis Date  . CAD (coronary artery disease)   . S/P CABG (coronary artery bypass graft)   . Hyperlipidemia   . GERD (gastroesophageal reflux disease)   . Presence of permanent cardiac pacemaker   . PAF (paroxysmal atrial fibrillation)     Cardioversion June 2011  . Chronic anticoagulation   . LBP (low back pain)   . AAA (abdominal aortic aneurysm)   . Diverticulosis   . Arthritis     Past Surgical History  Procedure Date  . Coronary artery bypass graft Feb 1989  . Permanent pacemaker July 2008  . Tonsillectomy   . Cataract extraction   . Joint replacement 1995 & 1996    Bilateral total knee replacements    History   Social History  . Marital Status: Married    Spouse Name: N/A    Number of Children: N/A  . Years of Education: N/A   Occupational History  . Not on file.   Social History Main Topics  . Smoking status: Former Smoker    Types: Cigarettes    Quit date: 08/17/1948  . Smokeless tobacco: Never Used  . Alcohol Use: No  . Drug Use: No  . Sexually Active: Not on file   Other Topics Concern  . Not on file   Social History Narrative  . No narrative on file    Family History  Problem Relation Age of Onset  . Stroke Mother   . Heart failure Mother   . Heart attack Father   . Heart disease Father   . Cancer Sister     breast  . Other Sister     DJD  . Heart disease Sister   . Heart disease Sister     Current Outpatient Prescriptions on File Prior to Visit  Medication Sig Dispense Refill  . acetaminophen (TYLENOL) 650 MG CR tablet  Take 1,300 mg by mouth daily.        . amiodarone (PACERONE) 200 MG tablet Take 200 mg by mouth 2 (two) times daily.        . amLODipine (NORVASC) 5 MG tablet Take 5 mg by mouth daily.        . atorvastatin (LIPITOR) 10 MG tablet Take 10 mg by mouth daily.        . furosemide (LASIX) 20 MG tablet Take 20 mg by mouth daily.        . Glucosamine-Chondroit-Vit C-Mn (GLUCOSAMINE 1500 COMPLEX) CAPS Take by mouth daily.        . metoprolol tartrate (LOPRESSOR) 25 MG tablet Take 25 mg by mouth 2 (two) times daily.        . Omega-3 Fatty Acids (FISH OIL) 1000 MG CPDR Take by mouth daily.        . omeprazole (PRILOSEC) 20 MG capsule Take 20 mg by mouth daily.        . silodosin (RAPAFLO) 4 MG CAPS capsule Take 4 mg by mouth at bedtime.        . VITAMIN D, CHOLECALCIFEROL, PO Take 2,000 Int'l Units by mouth daily.        .   WARFARIN SODIUM PO Take by mouth. As directed       . digoxin (LANOXIN) 0.125 MG tablet Take 1 tablet (125 mcg total) by mouth daily.  90 tablet  3  . isosorbide mononitrate (IMDUR) 30 MG 24 hr tablet Take 30 mg by mouth daily.          Allergies  Allergen Reactions  . Morphine     Review of Systems (Positive items in bold and italic, otherwise negative)  General: Weight loss, Weight gain, Loss of appetite, Fever  Neurologic: Dizziness, Blackouts, Headaches, Seizure  Ear/Nose/Throat: Change in eyesight, Change in hearing, Nose bleeds, Sore throat  Vascular: Pain in legs with walking, Pain in feet while lying flat, Non-healing ulcer, Stroke, "Mini stroke", Slurred speech, Temporary blindness, Blood clot in vein, Phlebitis  Pulmonary: Home oxygen, Productive cough, Bronchitis, Coughing up blood, Asthma, Wheezing  Musculoskeletal: Arthritis, Joint pain, Muscle pain  Cardiac: Chest pain, Chest tightness/pressure, Shortness of breath when lying flat, Shortness of breath with exertion, Palpitations, Heart murmur, Arrythmia, Atrial fibrillation  Hematologic: Bleeding problems, Clotting  disorder, Anemia  Psychiatric: Depression, Anxiety, Attention deficit disorder  Gastrointestinal: Black stool, Blood in stool, Peptic ulcer disease, Reflux, Hiatal hernia, Trouble swallowing, Diarrhea, Constipation  Urinary: Kidney disease, Burning with urination, Frequent urination, Difficulty urinating  Skin: Ulcers, Rashes  Physical Examination  Filed Vitals:   07/17/11 0853  BP: 106/69  Pulse: 87  Resp: 16  Height: 5' 8" (1.727 m)  Weight: 155 lb (70.308 kg)   Body mass index is 23.57 kg/(m^2).  General: A&O x 3, WDWN, elderly thin  Pulmonary: Sym exp, good air movt, CTAB, no rales, rhonchi, & wheezing  Cardiac: RRR, Nl S1, S2, no Murmurs, rubs or gallops  Vascular: Vessel Right Left  Radial Palpable Palpable  Brachial Palpable Palpable  Carotid Palpable, without bruit Palpable, without bruit  Aorta Non-palpable N/A  Femoral Palpable Palpable  Popliteal Non-palpable Non-palpable  PT Non-Palpable Non-Palpable  DP Faintly Palpable Faintly Palpable   Gastrointestinal: soft, NTND, -G/R, - HSM, - masses, - CVAT B, no TTP, left periumbilical pulsation  Musculoskeletal: M/S 5/5 throughout , Extremities without ischemic changes , B lipodermatosclerosis  Neurologic: Pain and light touch intact in extremities , Motor exam as listed above  Medical Decision Making  The patient is a 77 y.o. male who presents with: possibly symptomatic AAA growing > 0.5 cm/6 month.   Based on this patient's exam and diagnostic studies, he needs EVAR, possible percutaneous approach scheduled 11 DEC 12.  Also I will obtain a BLE ABI to set this patient's baseline exam for his lower extremities.  I emphasized the importance of maximal medical management including strict control of blood pressure, blood glucose, and lipid levels, obtaining regular exercise, and cessation of smoking.    Thank you for allowing us to participate in this patient's care.  Brian Chen, MD [facility] [place] Office: [phone] Pager: [phone]  07/17/2011, 9:45 AM   Addendum  BLE ABI (Date: 07/17/11)  RLE: 1.51, DP and PT: triphasic  LLE: 1.63, DP and PT: triphasic  Brian Chen, MD [facility] [place] Office: [phone] Pager: [phone]  07/17/2011, 7:00 PM   

## 2011-07-22 ENCOUNTER — Other Ambulatory Visit: Payer: Self-pay

## 2011-07-23 ENCOUNTER — Encounter (HOSPITAL_COMMUNITY): Payer: Self-pay | Admitting: Internal Medicine

## 2011-07-24 ENCOUNTER — Encounter (HOSPITAL_COMMUNITY): Payer: Self-pay

## 2011-07-24 ENCOUNTER — Other Ambulatory Visit: Payer: Self-pay

## 2011-07-24 ENCOUNTER — Encounter (HOSPITAL_COMMUNITY)
Admission: RE | Admit: 2011-07-24 | Discharge: 2011-07-24 | Disposition: A | Discharge status: Home / Self Care | Payer: Medicare Other | Source: Ambulatory Visit | Attending: Vascular Surgery | Admitting: Vascular Surgery

## 2011-07-24 LAB — URINALYSIS, ROUTINE W REFLEX MICROSCOPIC
Bilirubin Urine: NEGATIVE
Glucose, UA: NEGATIVE mg/dL
Hgb urine dipstick: NEGATIVE
Specific Gravity, Urine: 1.008 (ref 1.005–1.030)
pH: 5 (ref 5.0–8.0)

## 2011-07-24 LAB — COMPREHENSIVE METABOLIC PANEL
ALT: 18 U/L (ref 0–53)
AST: 24 U/L (ref 0–37)
Albumin: 3.8 g/dL (ref 3.5–5.2)
Alkaline Phosphatase: 67 U/L (ref 39–117)
BUN: 21 mg/dL (ref 6–23)
Chloride: 104 mEq/L (ref 96–112)
Potassium: 4.2 mEq/L (ref 3.5–5.1)
Sodium: 139 mEq/L (ref 135–145)
Total Bilirubin: 0.5 mg/dL (ref 0.3–1.2)
Total Protein: 6.8 g/dL (ref 6.0–8.3)

## 2011-07-24 LAB — CBC
HCT: 37.5 % — ABNORMAL LOW (ref 39.0–52.0)
RDW: 13.6 % (ref 11.5–15.5)
WBC: 7.7 10*3/uL (ref 4.0–10.5)

## 2011-07-24 LAB — BLOOD GAS, ARTERIAL
Bicarbonate: 24.2 mEq/L — ABNORMAL HIGH (ref 20.0–24.0)
FIO2: 0.21 %
TCO2: 25.5 mmol/L (ref 0–100)
pCO2 arterial: 40 mmHg (ref 35.0–45.0)
pH, Arterial: 7.4 (ref 7.350–7.450)

## 2011-07-24 LAB — DIFFERENTIAL
Basophils Relative: 0 % (ref 0–1)
Eosinophils Absolute: 0.1 10*3/uL (ref 0.0–0.7)
Eosinophils Relative: 1 % (ref 0–5)
Lymphs Abs: 2.1 10*3/uL (ref 0.7–4.0)
Monocytes Absolute: 0.9 10*3/uL (ref 0.1–1.0)
Monocytes Relative: 12 % (ref 3–12)
Neutrophils Relative %: 59 % (ref 43–77)

## 2011-07-24 LAB — ABO/RH: ABO/RH(D): O POS

## 2011-07-24 NOTE — Pre-Procedure Instructions (Addendum)
20 Adam [name]  07/24/2011   Your procedure is scheduled on:  Tuesday, December 11TH               REPORT TO [place] SHORT STAY AT  7:30 AM                                                                                                                                                                                                                                                                                       CALL THIS NUMBER IF YOU HAVE PROBLEMS THE MORNING             OF SURGERY---[phone]  Remember:   Do not eat food:After Midnight  MONDAY.  May have clear liquids: up to 4 Hours before arrival 3:30 AM.  Clear liquids include soda, tea, black coffee, apple or grape juice, broth.  Take these medicines the morning of surgery with A SIP OF WATER:  METOPROLOL,              OMEPRAZOLE, NORVASC  Do not wear jewelry, make-up or nail polish.  Do not wear lotions, powders, or perfumes. You may wear deodorant.  Do not shave 48 hours prior to surgery.  Do not bring valuables to the hospital.  Contacts, dentures or bridgework may not be worn into surgery.  Leave suitcase in the car. After surgery it may be brought to your room.  For patients admitted to the hospital, checkout time is 11:00 AM the day of discharge.   Patients discharged the day of surgery will not be allowed to drive home.  Name and phone number of your driver: Adam [name]  -  WIFE                                                                                                                                                                                                                                                                   Special Instructions: CHG  Shower Use Special Wash: 1/2 bottle night before surgery and 1/2 bottle morning of surgery.   Please read over the following fact sheets that you were given: Pain Booklet, Blood Transfusion Information, MRSA Information and Surgical Site Infection Prevention

## 2011-07-24 NOTE — Progress Notes (Signed)
PATIENT SEES DR Garnette Scheuermann.[name] HIM LAST WEEK AND  HAD NUCLEAR STRESS TEST....Marland KitchenALSO IN  A-FIB....HAS A [employer] PACEMAKER.....WILL GET RESULTS

## 2011-07-27 MED ORDER — DEXTROSE 5 % IV SOLN
1.5000 g | INTRAVENOUS | Status: AC
  Administered 2011-07-28: 1.5 g via INTRAVENOUS
  Filled 2011-07-27: qty 1.5

## 2011-07-28 ENCOUNTER — Encounter (HOSPITAL_COMMUNITY): Payer: Self-pay | Admitting: *Deleted

## 2011-07-28 ENCOUNTER — Inpatient Hospital Stay (HOSPITAL_COMMUNITY)
Admission: RE | Admit: 2011-07-28 | Discharge: 2011-07-29 | DRG: 238 | Disposition: A | Discharge status: Home / Self Care | Payer: Medicare Other | Source: Ambulatory Visit | Attending: Vascular Surgery | Admitting: Vascular Surgery

## 2011-07-28 ENCOUNTER — Other Ambulatory Visit: Payer: Self-pay | Admitting: Thoracic Diseases

## 2011-07-28 ENCOUNTER — Inpatient Hospital Stay (HOSPITAL_COMMUNITY): Payer: Medicare Other

## 2011-07-28 ENCOUNTER — Encounter (HOSPITAL_COMMUNITY): Payer: Self-pay | Admitting: Certified Registered"

## 2011-07-28 ENCOUNTER — Encounter (HOSPITAL_COMMUNITY)
Admission: RE | Disposition: A | Discharge status: Home / Self Care | Payer: Self-pay | Source: Ambulatory Visit | Attending: Vascular Surgery

## 2011-07-28 ENCOUNTER — Inpatient Hospital Stay (HOSPITAL_COMMUNITY): Payer: Medicare Other | Admitting: Certified Registered"

## 2011-07-28 DIAGNOSIS — I509 Heart failure, unspecified: Secondary | ICD-10-CM | POA: Diagnosis present

## 2011-07-28 DIAGNOSIS — Z01810 Encounter for preprocedural cardiovascular examination: Secondary | ICD-10-CM

## 2011-07-28 DIAGNOSIS — I714 Abdominal aortic aneurysm, without rupture, unspecified: Secondary | ICD-10-CM

## 2011-07-28 DIAGNOSIS — IMO0002 Reserved for concepts with insufficient information to code with codable children: Secondary | ICD-10-CM

## 2011-07-28 DIAGNOSIS — I4891 Unspecified atrial fibrillation: Secondary | ICD-10-CM | POA: Diagnosis present

## 2011-07-28 DIAGNOSIS — E785 Hyperlipidemia, unspecified: Secondary | ICD-10-CM | POA: Diagnosis present

## 2011-07-28 DIAGNOSIS — Z01812 Encounter for preprocedural laboratory examination: Secondary | ICD-10-CM

## 2011-07-28 DIAGNOSIS — K219 Gastro-esophageal reflux disease without esophagitis: Secondary | ICD-10-CM | POA: Diagnosis present

## 2011-07-28 DIAGNOSIS — Z950 Presence of cardiac pacemaker: Secondary | ICD-10-CM

## 2011-07-28 DIAGNOSIS — Z7901 Long term (current) use of anticoagulants: Secondary | ICD-10-CM

## 2011-07-28 DIAGNOSIS — M129 Arthropathy, unspecified: Secondary | ICD-10-CM | POA: Diagnosis present

## 2011-07-28 DIAGNOSIS — Z87891 Personal history of nicotine dependence: Secondary | ICD-10-CM

## 2011-07-28 DIAGNOSIS — Z96659 Presence of unspecified artificial knee joint: Secondary | ICD-10-CM

## 2011-07-28 DIAGNOSIS — M545 Low back pain, unspecified: Secondary | ICD-10-CM | POA: Diagnosis present

## 2011-07-28 DIAGNOSIS — Z9849 Cataract extraction status, unspecified eye: Secondary | ICD-10-CM

## 2011-07-28 DIAGNOSIS — I10 Essential (primary) hypertension: Secondary | ICD-10-CM | POA: Diagnosis present

## 2011-07-28 DIAGNOSIS — I25810 Atherosclerosis of coronary artery bypass graft(s) without angina pectoris: Secondary | ICD-10-CM | POA: Diagnosis present

## 2011-07-28 DIAGNOSIS — I2510 Atherosclerotic heart disease of native coronary artery without angina pectoris: Secondary | ICD-10-CM | POA: Diagnosis present

## 2011-07-28 HISTORY — PX: ENDOVASCULAR STENT INSERTION: SHX5161

## 2011-07-28 LAB — PROTIME-INR: INR: 1.19 (ref 0.00–1.49)

## 2011-07-28 SURGERY — ENDOVASCULAR STENT GRAFT INSERTION
Anesthesia: General | Wound class: Clean

## 2011-07-28 MED ORDER — PROPOFOL 10 MG/ML IV EMUL
INTRAVENOUS | Status: DC | PRN
  Administered 2011-07-28: 150 mg via INTRAVENOUS

## 2011-07-28 MED ORDER — MORPHINE SULFATE 2 MG/ML IJ SOLN: 0.0500 mg/kg | INTRAMUSCULAR | Status: DC | PRN

## 2011-07-28 MED ORDER — GLYCOPYRROLATE 0.2 MG/ML IJ SOLN
INTRAMUSCULAR | Status: DC | PRN
  Administered 2011-07-28: .4 mg via INTRAVENOUS

## 2011-07-28 MED ORDER — ACETAMINOPHEN 325 MG PO TABS
325.0000 mg | ORAL_TABLET | ORAL | Status: DC | PRN
  Administered 2011-07-28: 650 mg via ORAL
  Filled 2011-07-28: qty 2

## 2011-07-28 MED ORDER — SODIUM CHLORIDE 0.9 % IR SOLN
Status: DC | PRN
  Administered 2011-07-28: 1

## 2011-07-28 MED ORDER — FAMOTIDINE IN NACL 20-0.9 MG/50ML-% IV SOLN
20.0000 mg | Freq: Two times a day (BID) | INTRAVENOUS | Status: DC
  Administered 2011-07-28: 20 mg via INTRAVENOUS
  Filled 2011-07-28 (×3): qty 50

## 2011-07-28 MED ORDER — OXYCODONE-ACETAMINOPHEN 5-325 MG PO TABS: 1.0000 | ORAL_TABLET | ORAL | Status: AC | PRN

## 2011-07-28 MED ORDER — GUAIFENESIN-DM 100-10 MG/5ML PO SYRP: 15.0000 mL | ORAL_SOLUTION | ORAL | Status: DC | PRN

## 2011-07-28 MED ORDER — DOCUSATE SODIUM 100 MG PO CAPS: 100.0000 mg | ORAL_CAPSULE | Freq: Every day | ORAL | Status: DC

## 2011-07-28 MED ORDER — FENTANYL CITRATE 0.05 MG/ML IJ SOLN
INTRAMUSCULAR | Status: DC | PRN
  Administered 2011-07-28: 100 ug via INTRAVENOUS

## 2011-07-28 MED ORDER — METOPROLOL TARTRATE 1 MG/ML IV SOLN: 2.0000 mg | INTRAVENOUS | Status: DC | PRN

## 2011-07-28 MED ORDER — SODIUM CHLORIDE 0.9 % IV SOLN: INTRAVENOUS | Status: DC

## 2011-07-28 MED ORDER — LACTATED RINGERS IV SOLN
INTRAVENOUS | Status: DC | PRN
  Administered 2011-07-28: 13:00:00 via INTRAVENOUS

## 2011-07-28 MED ORDER — PHENYLEPHRINE HCL 10 MG/ML IJ SOLN
10.0000 mg | INTRAVENOUS | Status: DC | PRN
  Administered 2011-07-28: 10 ug/min via INTRAVENOUS

## 2011-07-28 MED ORDER — DEXTROSE-NACL 5-0.9 % IV SOLN
INTRAVENOUS | Status: DC
  Administered 2011-07-28: 100 mL/h via INTRAVENOUS
  Administered 2011-07-29: via INTRAVENOUS

## 2011-07-28 MED ORDER — ONDANSETRON HCL 4 MG/2ML IJ SOLN
INTRAMUSCULAR | Status: DC | PRN
  Administered 2011-07-28: 4 mg via INTRAVENOUS

## 2011-07-28 MED ORDER — PROTAMINE SULFATE 10 MG/ML IV SOLN
INTRAVENOUS | Status: DC | PRN
  Administered 2011-07-28 (×2): 15 mg via INTRAVENOUS

## 2011-07-28 MED ORDER — ONDANSETRON HCL 4 MG/2ML IJ SOLN
4.0000 mg | Freq: Four times a day (QID) | INTRAMUSCULAR | Status: DC | PRN
Start: 2011-07-28 — End: 2011-07-29

## 2011-07-28 MED ORDER — OXYCODONE-ACETAMINOPHEN 5-325 MG PO TABS: 1.0000 | ORAL_TABLET | ORAL | Status: DC | PRN

## 2011-07-28 MED ORDER — LACTATED RINGERS IV SOLN
INTRAVENOUS | Status: DC
  Administered 2011-07-28: 11:00:00 via INTRAVENOUS

## 2011-07-28 MED ORDER — ROCURONIUM BROMIDE 100 MG/10ML IV SOLN
INTRAVENOUS | Status: DC | PRN
  Administered 2011-07-28: 50 mg via INTRAVENOUS

## 2011-07-28 MED ORDER — SODIUM CHLORIDE 0.9 % IV SOLN: 500.0000 mL | Freq: Once | INTRAVENOUS | Status: AC | PRN

## 2011-07-28 MED ORDER — MAGNESIUM SULFATE 40 MG/ML IJ SOLN
2.0000 g | Freq: Once | INTRAMUSCULAR | Status: AC | PRN
  Filled 2011-07-28: qty 50

## 2011-07-28 MED ORDER — HEPARIN SODIUM (PORCINE) 1000 UNIT/ML IJ SOLN
INTRAMUSCULAR | Status: DC | PRN
  Administered 2011-07-28: 6000 [IU] via INTRAVENOUS

## 2011-07-28 MED ORDER — NEOSTIGMINE METHYLSULFATE 1 MG/ML IJ SOLN
INTRAMUSCULAR | Status: DC | PRN
  Administered 2011-07-28: 3 mg via INTRAVENOUS

## 2011-07-28 MED ORDER — DEXTROSE 5 % IV SOLN
1.5000 g | Freq: Two times a day (BID) | INTRAVENOUS | Status: DC
  Administered 2011-07-29: 1.5 g via INTRAVENOUS
  Filled 2011-07-28 (×2): qty 1.5

## 2011-07-28 MED ORDER — PANTOPRAZOLE SODIUM 40 MG PO TBEC: 40.0000 mg | DELAYED_RELEASE_TABLET | Freq: Every day | ORAL | Status: DC

## 2011-07-28 MED ORDER — SIMVASTATIN 20 MG PO TABS
20.0000 mg | ORAL_TABLET | Freq: Every day | ORAL | Status: DC
  Administered 2011-07-28: 20 mg via ORAL
  Filled 2011-07-28 (×3): qty 1

## 2011-07-28 MED ORDER — LABETALOL HCL 5 MG/ML IV SOLN: 10.0000 mg | INTRAVENOUS | Status: DC | PRN

## 2011-07-28 MED ORDER — POTASSIUM CHLORIDE CRYS ER 20 MEQ PO TBCR: 20.0000 meq | EXTENDED_RELEASE_TABLET | Freq: Once | ORAL | Status: AC | PRN

## 2011-07-28 MED ORDER — SILODOSIN 4 MG PO CAPS
4.0000 mg | ORAL_CAPSULE | Freq: Every day | ORAL | Status: DC
  Administered 2011-07-28: 4 mg via ORAL
  Filled 2011-07-28 (×2): qty 1

## 2011-07-28 MED ORDER — PHENOL 1.4 % MT LIQD: 1.0000 | OROMUCOSAL | Status: DC | PRN

## 2011-07-28 MED ORDER — HYDRALAZINE HCL 20 MG/ML IJ SOLN
10.0000 mg | INTRAMUSCULAR | Status: DC | PRN
  Filled 2011-07-28: qty 0.5

## 2011-07-28 MED ORDER — MEPERIDINE HCL 25 MG/ML IJ SOLN: 6.2500 mg | INTRAMUSCULAR | Status: DC | PRN

## 2011-07-28 MED ORDER — WARFARIN SODIUM 5 MG PO TABS
5.0000 mg | ORAL_TABLET | Freq: Every day | ORAL | Status: DC
  Filled 2011-07-28: qty 1

## 2011-07-28 MED ORDER — FUROSEMIDE 20 MG PO TABS
20.0000 mg | ORAL_TABLET | Freq: Every day | ORAL | Status: DC
  Administered 2011-07-28: 20 mg via ORAL
  Filled 2011-07-28 (×2): qty 1

## 2011-07-28 MED ORDER — NITROGLYCERIN 0.4 MG SL SUBL: 0.4000 mg | SUBLINGUAL_TABLET | SUBLINGUAL | Status: DC | PRN

## 2011-07-28 MED ORDER — ACETAMINOPHEN 650 MG RE SUPP: 325.0000 mg | RECTAL | Status: DC | PRN

## 2011-07-28 MED ORDER — AMLODIPINE BESYLATE 5 MG PO TABS
5.0000 mg | ORAL_TABLET | Freq: Every day | ORAL | Status: DC
  Administered 2011-07-28: 5 mg via ORAL
  Filled 2011-07-28 (×2): qty 1

## 2011-07-28 MED ORDER — HYDROMORPHONE HCL PF 1 MG/ML IJ SOLN: 0.5000 mg | INTRAMUSCULAR | Status: DC | PRN

## 2011-07-28 MED ORDER — ONDANSETRON HCL 4 MG/2ML IJ SOLN: 4.0000 mg | Freq: Once | INTRAMUSCULAR | Status: DC | PRN

## 2011-07-28 MED ORDER — SODIUM CHLORIDE 0.9 % IR SOLN
Status: DC | PRN
  Administered 2011-07-28: 13:00:00

## 2011-07-28 MED ORDER — DOPAMINE-DEXTROSE 3.2-5 MG/ML-% IV SOLN: 3.0000 ug/kg/min | INTRAVENOUS | Status: DC

## 2011-07-28 MED ORDER — HYDROMORPHONE HCL PF 1 MG/ML IJ SOLN: 0.2500 mg | INTRAMUSCULAR | Status: DC | PRN

## 2011-07-28 MED ORDER — IODIXANOL 320 MG/ML IV SOLN
INTRAVENOUS | Status: DC | PRN
  Administered 2011-07-28: 62.6 mL via INTRAVENOUS

## 2011-07-28 MED ORDER — METOPROLOL TARTRATE 25 MG PO TABS
25.0000 mg | ORAL_TABLET | Freq: Two times a day (BID) | ORAL | Status: DC
  Filled 2011-07-28 (×3): qty 1

## 2011-07-28 SURGICAL SUPPLY — 77 items
ADH SKN CLS APL DERMABOND .7 (GAUZE/BANDAGES/DRESSINGS) ×1
ADH SKN CLS LQ APL DERMABOND (GAUZE/BANDAGES/DRESSINGS) ×1
BAG DECANTER FOR FLEXI CONT (MISCELLANEOUS) IMPLANT
BALLN CODA OCL 2-9.0-35-120-3 (BALLOONS)
BALLOON COD OCL 2-9.0-35-120-3 (BALLOONS) IMPLANT
CANISTER SUCTION 2500CC (MISCELLANEOUS) ×2 IMPLANT
CATH BEACON 5.038 65CM KMP-01 (CATHETERS) ×1 IMPLANT
CATH OMNI FLUSH .035X70CM (CATHETERS) ×1 IMPLANT
CLIP TI MEDIUM 24 (CLIP) IMPLANT
CLIP TI WIDE RED SMALL 24 (CLIP) IMPLANT
CLOTH BEACON ORANGE TIMEOUT ST (SAFETY) ×2 IMPLANT
COVER MAYO STAND STRL (DRAPES) ×2 IMPLANT
COVER PROBE W GEL 5X96 (DRAPES) ×1 IMPLANT
COVER SURGICAL LIGHT HANDLE (MISCELLANEOUS) ×4 IMPLANT
DERMABOND ADHESIVE PROPEN (GAUZE/BANDAGES/DRESSINGS) ×1
DERMABOND ADVANCED (GAUZE/BANDAGES/DRESSINGS) ×1
DERMABOND ADVANCED .7 DNX12 (GAUZE/BANDAGES/DRESSINGS) ×1 IMPLANT
DERMABOND ADVANCED .7 DNX6 (GAUZE/BANDAGES/DRESSINGS) IMPLANT
DEVICE CLOSURE PERCLS PRGLD 6F (VASCULAR PRODUCTS) IMPLANT
DRAIN CHANNEL 10F 3/8 F FF (DRAIN) IMPLANT
DRAIN CHANNEL 10M FLAT 3/4 FLT (DRAIN) IMPLANT
DRAPE C-ARM 42X72 X-RAY (DRAPES) ×2 IMPLANT
DRAPE TABLE COVER HEAVY DUTY (DRAPES) ×2 IMPLANT
DRSG TEGADERM 2-3/8X2-3/4 SM (GAUZE/BANDAGES/DRESSINGS) ×1 IMPLANT
ELECT CAUTERY BLADE 6.4 (BLADE) ×2 IMPLANT
ELECT REM PT RETURN 9FT ADLT (ELECTROSURGICAL) ×4
ELECTRODE REM PT RTRN 9FT ADLT (ELECTROSURGICAL) ×2 IMPLANT
EVACUATOR 3/16  PVC DRAIN (DRAIN)
EVACUATOR 3/16 PVC DRAIN (DRAIN) IMPLANT
EVACUATOR SILICONE 100CC (DRAIN) IMPLANT
GAUZE SPONGE 4X4 16PLY XRAY LF (GAUZE/BANDAGES/DRESSINGS) ×1 IMPLANT
GLOVE BIO SURGEON STRL SZ7 (GLOVE) ×2 IMPLANT
GLOVE BIO SURGEON STRL SZ7.5 (GLOVE) ×1 IMPLANT
GLOVE BIOGEL PI IND STRL 6.5 (GLOVE) IMPLANT
GLOVE BIOGEL PI IND STRL 7.5 (GLOVE) ×1 IMPLANT
GLOVE BIOGEL PI INDICATOR 6.5 (GLOVE) ×3
GLOVE BIOGEL PI INDICATOR 7.5 (GLOVE) ×3
GLOVE SS BIOGEL STRL SZ 7 (GLOVE) IMPLANT
GLOVE SUPERSENSE BIOGEL SZ 7 (GLOVE) ×2
GOWN STRL NON-REIN LRG LVL3 (GOWN DISPOSABLE) ×8 IMPLANT
GRAFT BALLN CATH 65CM (STENTS) IMPLANT
GRAFT ENDOPROSETHESIS 23/12/14 (Endovascular Graft) ×1 IMPLANT
GRAFT EXCLUDER LEG 12X10 (Endovascular Graft) ×1 IMPLANT
INTRODUCER PERFORM 12 30 .038 (SHEATH) ×1 IMPLANT
KIT BASIN OR (CUSTOM PROCEDURE TRAY) ×2 IMPLANT
KIT ROOM TURNOVER OR (KITS) ×2 IMPLANT
NDL PERC 18GX7CM (NEEDLE) ×1 IMPLANT
NEEDLE PERC 18GX7CM (NEEDLE) ×2 IMPLANT
NS IRRIG 1000ML POUR BTL (IV SOLUTION) ×4 IMPLANT
PACK AORTA (CUSTOM PROCEDURE TRAY) ×2 IMPLANT
PAD ARMBOARD 7.5X6 YLW CONV (MISCELLANEOUS) ×4 IMPLANT
PENCIL BUTTON HOLSTER BLD 10FT (ELECTRODE) IMPLANT
PERCLOSE PROGLIDE 6F (VASCULAR PRODUCTS) ×8
SHEATH AVANTI 11CM 8FR (MISCELLANEOUS) ×1 IMPLANT
SHEATH BRITE TIP 8FR 23CM (MISCELLANEOUS) ×1 IMPLANT
SHEATH INTRODUCER 18X30 (VASCULAR PRODUCTS) ×2
SHEATH PERFORMER 18FRX30 (VASCULAR PRODUCTS) IMPLANT
STAPLER VISISTAT 35W (STAPLE) IMPLANT
STENT GRAFT BALLN CATH 65CM (STENTS) ×1
STOPCOCK MORSE 400PSI 3WAY (MISCELLANEOUS) ×2 IMPLANT
SUT ETHILON 3 0 PS 1 (SUTURE) IMPLANT
SUT MNCRL AB 4-0 PS2 18 (SUTURE) ×4 IMPLANT
SUT PROLENE 5 0 C 1 24 (SUTURE) IMPLANT
SUT VIC AB 2-0 CTX 36 (SUTURE) IMPLANT
SUT VIC AB 3-0 SH 27 (SUTURE)
SUT VIC AB 3-0 SH 27X BRD (SUTURE) IMPLANT
SYR 20CC LL (SYRINGE) ×4 IMPLANT
SYR 30ML LL (SYRINGE) IMPLANT
SYR MEDRAD MARK V 150ML (SYRINGE) ×2 IMPLANT
SYRINGE 10CC LL (SYRINGE) ×6 IMPLANT
TOWEL OR 17X24 6PK STRL BLUE (TOWEL DISPOSABLE) ×4 IMPLANT
TOWEL OR 17X26 10 PK STRL BLUE (TOWEL DISPOSABLE) ×4 IMPLANT
TRAY FOLEY CATH 14FRSI W/METER (CATHETERS) ×2 IMPLANT
TUBING HIGH PRESSURE 120CM (CONNECTOR) ×2 IMPLANT
WATER STERILE IRR 1000ML POUR (IV SOLUTION) ×2 IMPLANT
WIRE AMPLATZ SS-J .035X180CM (WIRE) ×2 IMPLANT
WIRE BENTSON .035X145CM (WIRE) ×2 IMPLANT

## 2011-07-28 NOTE — Interval H&P Note (Signed)
--    [facility] [place]  History and Physical Update  The patient was interviewed and re-examined.  The patient's History and Physical has been reviewed and is unchanged.  There is no change in the plan of care.  Leonides Sake, MD [facility] [place] Office: [phone] Pager: [phone]  07/28/2011, 8:24 AM

## 2011-07-28 NOTE — Progress Notes (Signed)
Spoke w/ amber  [facility].SHE WILL FAX BACK ORDER FOR DEVICE DURING SURGERY.CONTACTED JULIE [employer]. SHE WILL COME TO HOLDING @1000  FOR ANY REQUIRED CARE

## 2011-07-28 NOTE — Op Note (Addendum)
OPERATIVE NOTE   PROCEDURE: 1. Bilateral common femoral artery cannulation under ultrasound guidance 2. "Preclose" repair bilateral common femoral artery  3. Placement of catheter in aorta 4. Aortogram 5. Repair of aorta with modular bifurcated prosthesis with one limb 6. Radiology S&I  PRE-OPERATIVE DIAGNOSIS: enlarging abdominal aortic aneurysm   POST-OPERATIVE DIAGNOSIS: same as above   CO-SURGEONs: Leonides Sake, MD; Cari Caraway, MD  ANESTHESIA: general  ESTIMATED BLOOD LOSS: 50 cc  FINDING(S): 1. No type I endoleak 2. Small delayed type II endoleak distally 3. Bilateral renal arteries widely patent at end of case 4. Successful exclusion of abdominal aortic aneurysm  5. Dopplerable bilateral pedal signals  INDICATIONS: AHRON HULBERT is a 77 y.o. male who presents with enlarging abdominal aortic aneurysm (>5 mm/6 mon).  The patient continued to have abdominal pain which could not be isolated from the abdominal aortic aneurysm, subsequently I felt he needed repair for an enlarging possibly symptomatic abdominal aortic aneurysm.  The patient is aware the risks of aortic surgery include but are not limited to: bleeding, need for transfusion, infection, death, stroke, paralysis, wound complications, impotence, bowel ischemia, extended ventilation and need for secondary procedures.  Overall, I cited a mortality rate of 1-2% and morbidity rate of 15%.  DESCRIPTION:After full informed written consent was obtained from the patient, he was brought back to the operating room, amd placed supine upon the operating table. He already had a A-line place.  After obtaining adequate anesthesia, a Foley catheter was placed to monitor urine output in the operating room.   He was prepped and draped in the standard fashion for either open aneurysm repair or endovascular aneurysm repair. I turned my attention to the right groin.  Under ultrasound guidance, I identified a common femoral artery and made a   stab incision over it. I dissected down to the artery in the groin with a hemostat and then cannulated it with a 18-gauge needle. I then passed a Bentson wire up into the iliac system. The first ProGlide was placed and rotated medially. The second ProGlide was placed and rotated laterally.  In such fashion, I completed the pre-close technique on this side and then replaced the Bentson wire up into the aorta. Initially, a 8-French sheath was loaded over the wire and used to dilate up this tract, then using a Kumpfe catheter, I was able to get up into the aorta and exchange the wire out for a Amplatz wire. At this point then the left groin was cannulated by Dr. Edilia Bo in a similar fashion to my side and the pre-close technique was utilized on this side. At this point, the sheath on the right side was exchanged for a 18-French sheath.  The patient was given 6000 units of Heparin which was a therapeutic bolus for the patient.  I delivered the main body device, which was a 23-cm x 12-mm x 14-cm, which I delivered up to the level about L1.  On Dr. Adele Dan site, then he loaded a pigtail catheter, this was connected to the power injector circuit and a power injector aortogram was completed to identify the renal arteries. The lowest artery was the left renal artery. I pulled the main body device to below this level and deployed it, taking care to take into account the angulation here. I was able to deploy the graft exactly below the level of the left renal artery avoiding covering it. At this point, Dr. Edilia Bo pulled back the pigtail catheter and wire and then using this  combination, the pigtail catheter and Bentson, he was able to cannulate the contralateral gate which was in a cross limb configuration. He was then able to advance both of these through the graft.  The pigtail was then pulled down into the graft and rotated to demonstrate successful cannulation of the graft.  He then pulled the catheter down to the flow  bifurcation and then pulled the sheath back on the left side and did a retrograde injection from the sheath demonstrating the level of the internal iliac artery. Then subsequently based on the measurements, a 12-mm x 10 cm contralateral limb was selected. At this point, the left 8-French sheath was exchanged out for a 12-French sheath which was then advanced up to level of flow divider and the device. The contralateral limb was then placed through this sheath and then the sheath was pulled back to appropriate location. The iliac device was then deployed with adequate overlap. He then removed the sheath deployment system. At this point, I fully deployed the main body, fully extending the partially constrained limb and also releasing the device from the delivery system.  The delivery system was then removed. We then obtained a molding balloon which was used to mold the top, the flow divider and the ends of each iliac limb.  The pigtail catheter was replaced on the left side and then a completion aortogram was completed.  This demonstrated no type I endoleak and a small delayed type 2 endoleak.  I expect this delayed endoleak to resolve once anticoagulation is reversed.  Subsequently, we turned our attention to repairing each groin. The right groin was repaired by cinching down the  ProGlide devices in two stages. In the first stage, the wire was left in place after removing the sheath. There was absolutely no drop in pressure with removal of the sheath and then there was minimal blood loss with the wire in place. The wire was then removed from the right groin and the white tightening stitches was pulled to fully deploy the Proglide devices.  All four sutures were held in place with a hemostat with tension on the skin.  In a similar fashion, the left groin was repaired.  There was no further significant bleeding from either groin cannulation site so the sutures were transected with suture transection device.  Each  groin was repaired with a U stitch of 4-0 Monocryl.  Each foot was then tested: both had faintly palpable dorsalis pedis pulses and dopplerable pedals signals throughout.    SPECIMEN(S):  none  COMPLICATIONS: none  CONDITION: stable  Leonides Sake, MD [facility] [place] Office: [phone] Pager: [phone]  07/28/2011, 2:02 PM

## 2011-07-28 NOTE — Brief Op Note (Signed)
BRIEF OPERATIVE NOTE   PROCEDURE: 1. Bilateral common femoral artery cannulation under ultrasound guidance 2. "Preclose" repair bilateral common femoral artery  3. Placement of catheter in aorta 4. Aortogram 5. Repair of aorta with modular bifurcated prosthesis with one limb 6. Radiology S&I  PRE-OPERATIVE DIAGNOSIS: enlarging abdominal aortic aneurysm   POST-OPERATIVE DIAGNOSIS: same as above   CO-SURGEONs: Leonides Sake, MD; Cari Caraway, MD  ANESTHESIA: general  ESTIMATED BLOOD LOSS: 50 cc  FINDING(S): 1. No type I endoleak 2. Small delayed type II endoleak distally 3. Bilateral renal arteries widely patent at end of case 4. Successful exclusion of abdominal aortic aneurysm  5. Dopplerable bilateral pedal signals  SPECIMEN(S):  none  COMPLICATIONS: none  CONDITION: stable  Leonides Sake, MD [facility] [place] Office: [phone] Pager: [phone]  07/28/2011, 2:02 PM

## 2011-07-28 NOTE — H&P (View-Only) (Signed)
[facility] [place]  Established Abdominal Aortic Aneurysm  History of Present Illness  The patient is a 77 y.o. male who presents with chief complaint: follow up from Cardiology preop for EVAR.  Previous studies demonstrate an AAA, measuring 4.9 cm.  The patient does have intermittent abdominal pain.  The patient was cleared for surgery by his cardiologist recently.  Past Medical History  Diagnosis Date  . CAD (coronary artery disease)   . S/P CABG (coronary artery bypass graft)   . Hyperlipidemia   . GERD (gastroesophageal reflux disease)   . Presence of permanent cardiac pacemaker   . PAF (paroxysmal atrial fibrillation)     Cardioversion June 2011  . Chronic anticoagulation   . LBP (low back pain)   . AAA (abdominal aortic aneurysm)   . Diverticulosis   . Arthritis     Past Surgical History  Procedure Date  . Coronary artery bypass graft Feb 1989  . Permanent pacemaker July 2008  . Tonsillectomy   . Cataract extraction   . Joint replacement 1995 & 1996    Bilateral total knee replacements    History   Social History  . Marital Status: Married    Spouse Name: N/A    Number of Children: N/A  . Years of Education: N/A   Occupational History  . Not on file.   Social History Main Topics  . Smoking status: Former Smoker    Types: Cigarettes    Quit date: 08/17/1948  . Smokeless tobacco: Never Used  . Alcohol Use: No  . Drug Use: No  . Sexually Active: Not on file   Other Topics Concern  . Not on file   Social History Narrative  . No narrative on file    Family History  Problem Relation Age of Onset  . Stroke Mother   . Heart failure Mother   . Heart attack Father   . Heart disease Father   . Cancer Sister     breast  . Other Sister     DJD  . Heart disease Sister   . Heart disease Sister     Current Outpatient Prescriptions on File Prior to Visit  Medication Sig Dispense Refill  . acetaminophen (TYLENOL) 650 MG CR tablet  Take 1,300 mg by mouth daily.        [name] amiodarone (PACERONE) 200 MG tablet Take 200 mg by mouth 2 (two) times daily.        [name] amLODipine (NORVASC) 5 MG tablet Take 5 mg by mouth daily.        [name] atorvastatin (LIPITOR) 10 MG tablet Take 10 mg by mouth daily.        . furosemide (LASIX) 20 MG tablet Take 20 mg by mouth daily.        . Glucosamine-Chondroit-Vit C-Mn (GLUCOSAMINE 1500 COMPLEX) CAPS Take by mouth daily.        . metoprolol tartrate (LOPRESSOR) 25 MG tablet Take 25 mg by mouth 2 (two) times daily.        . Omega-3 Fatty Acids (FISH OIL) 1000 MG CPDR Take by mouth daily.        [name] omeprazole (PRILOSEC) 20 MG capsule Take 20 mg by mouth daily.        . silodosin (RAPAFLO) 4 MG CAPS capsule Take 4 mg by mouth at bedtime.        [name] VITAMIN D, CHOLECALCIFEROL, PO Take 2,000 Int'l Units by mouth daily.        [name]  WARFARIN SODIUM PO Take by mouth. As directed       . digoxin (LANOXIN) 0.125 MG tablet Take 1 tablet (125 mcg total) by mouth daily.  90 tablet  3  . isosorbide mononitrate (IMDUR) 30 MG 24 hr tablet Take 30 mg by mouth daily.          Allergies  Allergen Reactions  . Morphine     Review of Systems (Positive items in bold and italic, otherwise negative)  General: Weight loss, Weight gain, Loss of appetite, Fever  Neurologic: Dizziness, Blackouts, Headaches, Seizure  Ear/Nose/Throat: Change in eyesight, Change in hearing, Nose bleeds, Sore throat  Vascular: Pain in legs with walking, Pain in feet while lying flat, Non-healing ulcer, Stroke, "Mini stroke", Slurred speech, Temporary blindness, Blood clot in vein, Phlebitis  Pulmonary: Home oxygen, Productive cough, Bronchitis, Coughing up blood, Asthma, Wheezing  Musculoskeletal: Arthritis, Joint pain, Muscle pain  Cardiac: Chest pain, Chest tightness/pressure, Shortness of breath when lying flat, Shortness of breath with exertion, Palpitations, Heart murmur, Arrythmia, Atrial fibrillation  Hematologic: Bleeding problems, Clotting  disorder, Anemia  Psychiatric: Depression, Anxiety, Attention deficit disorder  Gastrointestinal: Black stool, Blood in stool, Peptic ulcer disease, Reflux, Hiatal hernia, Trouble swallowing, Diarrhea, Constipation  Urinary: Kidney disease, Burning with urination, Frequent urination, Difficulty urinating  Skin: Ulcers, Rashes  Physical Examination  Filed Vitals:   07/17/11 0853  BP: 106/69  Pulse: 87  Resp: 16  Height: 5\' 8"  (1.727 m)  Weight: 155 lb (70.308 kg)   Body mass index is 23.57 kg/(m^2).  General: A&O x 3, WDWN, elderly thin  Pulmonary: Sym exp, good air movt, CTAB, no rales, rhonchi, & wheezing  Cardiac: RRR, Nl S1, S2, no Murmurs, rubs or gallops  Vascular: Vessel Right Left  Radial Palpable Palpable  Brachial Palpable Palpable  Carotid Palpable, without bruit Palpable, without bruit  Aorta Non-palpable N/A  Femoral Palpable Palpable  Popliteal Non-palpable Non-palpable  PT Non-Palpable Non-Palpable  DP Faintly Palpable Faintly Palpable   Gastrointestinal: soft, NTND, -G/R, - HSM, - masses, - CVAT B, no TTP, left periumbilical pulsation  Musculoskeletal: M/S 5/5 throughout , Extremities without ischemic changes , B lipodermatosclerosis  Neurologic: Pain and light touch intact in extremities , Motor exam as listed above  Medical Decision Making  The patient is a 77 y.o. male who presents with: possibly symptomatic AAA growing > 0.5 cm/6 month.   Based on this patient's exam and diagnostic studies, he needs EVAR, possible percutaneous approach scheduled 11 DEC 12.  Also I will obtain a BLE ABI to set this patient's baseline exam for his lower extremities.  I emphasized the importance of maximal medical management including strict control of blood pressure, blood glucose, and lipid levels, obtaining regular exercise, and cessation of smoking.    Thank you for allowing [place] to participate in this patient's care.  Leonides Sake, MD [facility] [place] Office: [phone] Pager: [phone]  07/17/2011, 9:45 AM   Addendum  BLE ABI (Date: 07/17/11)  RLE: 1.51, DP and PT: triphasic  LLE: 1.63, DP and PT: triphasic  Leonides Sake, MD [facility] [place] Office: [phone] Pager: [phone]  07/17/2011, 7:00 PM

## 2011-07-28 NOTE — Transfer of Care (Signed)
Immediate Anesthesia Transfer of Care Note  Patient: Adam [name]  Procedure(s) Performed:  ENDOVASCULAR STENT GRAFT INSERTION - Pt. on Coumadin/ instructed to hold 5 days prior to procedure  Patient Location: PACU  Anesthesia Type: General  Level of Consciousness: alert   Airway & Oxygen Therapy: Patient Spontanous Breathing  Post-op Assessment: Report given to PACU RN  Post vital signs: stable  Complications: No apparent anesthesia complications

## 2011-07-28 NOTE — Op Note (Signed)
07/28/2011  PREOP DIAGNOSIS: abdominal aortic aneurysm  POSTOP DIAGNOSIS: same  PROCEDURE:  1. Ultrasound-guided access to left common femoral artery. 2. Placement of 12 mm x 10 cm [place] contralateral limb 3. Pre-closure and closure of left common femoral artery using 2 Perclose devices   COSURGEONS: Leonides Sake, MD, Di Kindle. Edilia Bo, MD, FACS  ANESTHESIA: Gen.   EBL: minimal  INDICATION: This is a pleasant 77 year old gentleman who was evaluated by Dr. Leonides Sake with an abdominal aortic aneurysm. He was felt to be a good candidate for percutaneous endovascular aneurysm repair. The procedure is as dictated by Dr. Leonides Sake. I performed the above-mentioned aspects of the procedure.  TECHNIQUE: The patient was taken to the operating room and received a general anesthetic. On the left side after the patient had been prepped and draped using ultrasound guidance the left common femoral artery was directly cannulated. The tract was dilated using a hemostat and then the artery was preclosed as follows using 2 Perclose devices. The initial device was rotated medially 30. The second device was rotated laterally 30. Once the artery had been preclosed an 8 [place] sheath was introduced over the wire.  Once the trunk ipsilateral component had been deployed I retracted the pigtail catheter into the aortic sac and then was able to cannulate the gate without difficulty. The catheter was then advanced into the graft and the wire removed and the catheter twisted to be sure that it was intraluminal. The Amplatz wire was then reintroduced. The core contralateral limb, which was a 12 mm x 10 cm in length device, was then advanced through the 12 [place] sheath which had been placed through the gate. Once the contralateral limb was in good position with 3 cm of overlap within the contralateral gate the sheath was retracted and the contralateral limb deployed. Patient arteriogram showed excellent result.  At  the completion of the procedure, the sheath was removed and the 2 Perclose devices were secured using a knot pusher. The wire was then removed the knots tot tightened again and there was good hemostasis. Small incision the skin was closed with 4-0 subcuticular stitch. All needle and sponge counts were correct.     Waverly Ferrari, MD, FACS [facility] [place]  DATE OF OPERATION: 07/28/2011 DATE OF DICTATION: 07/28/2011

## 2011-07-28 NOTE — Preoperative (Signed)
Beta Blockers   Reason not to administer Beta Blockers:Not Applicable 

## 2011-07-28 NOTE — Anesthesia Postprocedure Evaluation (Signed)
  Anesthesia Post-op Note  Patient: Adam [name]  Procedure(s) Performed:  ENDOVASCULAR STENT GRAFT INSERTION - Pt. on Coumadin/ instructed to hold 5 days prior to procedure  Patient Location: PACU  Anesthesia Type: General  Level of Consciousness: awake, alert  and oriented  Airway and Oxygen Therapy: Patient Spontanous Breathing  Post-op Pain: mild  Post-op Assessment: Post-op Vital signs reviewed, Patient's Cardiovascular Status Stable, Respiratory Function Stable, Patent Airway, No signs of Nausea or vomiting and Pain level controlled  Post-op Vital Signs: Reviewed and stable  Complications: No apparent anesthesia complications

## 2011-07-28 NOTE — Anesthesia Preprocedure Evaluation (Addendum)
Anesthesia Evaluation  Patient identified by [name], date of birth, ID band Patient awake    Reviewed: Allergy & Precautions, H&P , NPO status , Patient's Chart, lab work & pertinent test results  Airway Mallampati: I TM Distance: >3 FB Neck ROM: Full    Dental  (+) Teeth Intact and Dental Advisory Given   Pulmonary    Pulmonary exam normal       Cardiovascular + CAD     Neuro/Psych    GI/Hepatic GERD-  Medicated and Controlled,  Endo/Other    Renal/GU      Musculoskeletal   Abdominal   Peds  Hematology   Anesthesia Other Findings   Reproductive/Obstetrics                          Anesthesia Physical Anesthesia Plan  ASA: III  Anesthesia Plan: General   Post-op Pain Management:    Induction: Intravenous  Airway Management Planned: Oral ETT  Additional Equipment:   Intra-op Plan:   Post-operative Plan: Extubation in OR  Informed Consent: I have reviewed the patients History and Physical, chart, labs and discussed the procedure including the risks, benefits and alternatives for the proposed anesthesia with the patient or authorized representative who has indicated his/her understanding and acceptance.   Dental advisory given  Plan Discussed with: CRNA, Anesthesiologist and Surgeon  Anesthesia Plan Comments:         Anesthesia Quick Evaluation

## 2011-07-28 NOTE — Anesthesia Procedure Notes (Signed)
Procedure Name: Intubation Date/Time: 07/28/2011 12:08 PM Performed by: Ellin Goodie Pre-anesthesia Checklist: Patient identified, Emergency Drugs available, Suction available, Patient being monitored and Timeout performed Patient Re-evaluated:Patient Re-evaluated prior to inductionOxygen Delivery Method: Circle System Utilized Preoxygenation: Pre-oxygenation with 100% oxygen Intubation Type: IV induction Ventilation: Mask ventilation without difficulty Laryngoscope Size: Mac and 3 Grade View: Grade I Tube type: Oral Tube size: 7.5 mm Number of attempts: 1 Airway Equipment and Method: stylet Placement Confirmation: ETT inserted through vocal cords under direct vision,  positive ETCO2 and breath sounds checked- equal and bilateral Secured at: 22 cm Tube secured with: Tape Dental Injury: Teeth and Oropharynx as per pre-operative assessment

## 2011-07-29 LAB — BASIC METABOLIC PANEL
BUN: 9 mg/dL (ref 6–23)
Calcium: 8.4 mg/dL (ref 8.4–10.5)
Creatinine, Ser: 0.79 mg/dL (ref 0.50–1.35)
GFR calc Af Amer: >90 mL/min (ref >90)
GFR calc non Af Amer: 80 mL/min — ABNORMAL LOW (ref >90)
Glucose, Bld: 115 mg/dL — ABNORMAL HIGH (ref 70–99)
Potassium: 3.4 mEq/L — ABNORMAL LOW (ref 3.5–5.1)

## 2011-07-29 LAB — PROTIME-INR: INR: 1.26 (ref 0.00–1.49)

## 2011-07-29 LAB — CBC
HCT: 32.2 % — ABNORMAL LOW (ref 39.0–52.0)
Hemoglobin: 11 g/dL — ABNORMAL LOW (ref 13.0–17.0)
MCH: 33.4 pg (ref 26.0–34.0)
MCHC: 34.2 g/dL (ref 30.0–36.0)
MCV: 97.9 fL (ref 78.0–100.0)
RDW: 13.7 % (ref 11.5–15.5)

## 2011-07-29 MED ORDER — OXYCODONE-ACETAMINOPHEN 5-325 MG PO TABS: 1.0000 | ORAL_TABLET | ORAL | Status: AC | PRN

## 2011-07-29 NOTE — Discharge Summary (Signed)
[facility] Discharge Summary  Adam [name] 11-Apr-1925 77 y.o. male  [id number]  Admission Date: 07/28/2011  Discharge Date: 07/29/11  Physician: Nilda Simmer, MD  Admission Diagnosis:  Abdominal Aortic Aneurysm   HPI:   This is a 77 y.o. male who presents with chief complaint: follow up from Cardiology preop for EVAR. Previous studies demonstrate an AAA, measuring 4.9 cm. The patient does have intermittent abdominal pain. The patient was cleared for surgery by his cardiologist recently.   Hospital Course:  The patient was admitted to the hospital and taken to the operating room on 07/28/2011 and underwent: 1.  Bilateral common femoral artery cannulation under ultrasound guidance 2. "Preclose" repair bilateral common femoral artery  3. Placement of catheter in aorta 4. Aortogram 5. Repair of aorta with modular bifurcated prosthesis with one limb 6. Radiology S&I 7.  The pt tolerated the procedure well and was transported to the PACU in good condition. By POD 1, he is doing well without complaints.  His bilateral groins are without hematoma and he has palpable DP pulses bilaterally .  He is in NSR this am.  He has a Hx of PAF and is on Coumadin for this.  We will restart his coumadin today and have him f/u with Dr. Michaelle Copas office on 07/31/11 to have his INR checked.  The remainder of the hospital course consisted of increasing ambulation and increasing intake of solids without difficulty.    Basename 07/29/11 0510  NA 141  K 3.4*  CL 105  CO2 28  GLUCOSE 115*  BUN 9  CALCIUM 8.4    Basename 07/29/11 0510  WBC 9.5  HGB 11.0*  HCT 32.2*  PLT 92*    Basename 07/29/11 0510 07/28/11 0847  INR 1.26 1.19     Discharge Instructions:   The patient is discharged to home with extensive instructions on wound care and progressive ambulation.  They are instructed not to drive or perform any heavy lifting until returning to see the physician  in his office.  Discharge Orders    Future Appointments: Provider: Department: Dept Phone: Center:   07/30/2011 11:55 AM Joice Lofts Caryl Bis, RN [facility] [phone] [facility]   09/04/2011 12:45 PM Nilda Simmer, MD [facility] [phone] [facility]   10/06/2011 9:45 AM Duke Salvia, MD [facility] [phone] [facility]     Future Orders Please Complete By Expires   Resume previous diet      Driving Restrictions      Comments:   No driving for 2 weeks   Lifting restrictions      Comments:   No lifting for 4 weeks   Call MD for:  temperature >100.5      Call MD for:  redness, tenderness, or signs of infection (pain, swelling, bleeding, redness, odor or green/yellow discharge around incision site)      Call MD for:  severe or increased pain, loss or decreased feeling  in affected limb(s)      Increase activity slowly      Comments:   Walk with assistance use walker or cane as needed   May shower       Scheduling Instructions:   Thursday   may wash over wound with mild soap and water      Remove dressing in 48 hours      ABDOMINAL PROCEDURE/ANEURYSM REPAIR/AORTO-BIFEMORAL BYPASS:  Call MD for increased abdominal pain; cramping diarrhea; nausea/vomiting      Resume previous diet  Driving Restrictions      Comments:   No driving for 4 weeks   Lifting restrictions      Comments:   No lifting for 4 weeks   Call MD for:  temperature >100.5      Call MD for:  redness, tenderness, or signs of infection (pain, swelling, bleeding, redness, odor or green/yellow discharge around incision site)      Call MD for:  severe or increased pain, loss or decreased feeling  in affected limb(s)      may wash over wound with mild soap and water      Scheduling Instructions:   Shower daily with soap and water starting Thursday 07/30/11   ABDOMINAL PROCEDURE/ANEURYSM REPAIR/AORTO-BIFEMORAL BYPASS:  Call MD for increased abdominal pain; cramping diarrhea; nausea/vomiting          Discharge Diagnosis:  Abdominal Aortic Aneurysm  Secondary Diagnosis: Patient Active Problem List  Diagnoses  . HYPERTENSION, HEART CONTROLLED W/O ASSOC CHF  . CORONARY ATHEROSCLEROSIS, ARTERY BYPASS GRAFT  . ATRIAL FIBRILLATION  . PACEMAKER, PERMANENT  . Abdominal aneurysm without mention of rupture   Past Medical History  Diagnosis Date  . S/P CABG (coronary artery bypass graft)   . Hyperlipidemia   . GERD (gastroesophageal reflux disease)   . Presence of permanent cardiac pacemaker   . Chronic anticoagulation   . LBP (low back pain)   . AAA (abdominal aortic aneurysm)   . Diverticulosis   . Arthritis   . 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 PAF (paroxysmal atrial fibrillation)     Cardioversion June 2011 & IN 2012       Adam [name], Adam [name]  Home Medication Instructions ZOX:[id number]   Printed on:07/29/11 [phone]  Medication Information                    atorvastatin (LIPITOR) 10 MG tablet Take 10 mg by mouth daily.             amLODipine (NORVASC) 5 MG tablet Take 5 mg by mouth daily.             metoprolol tartrate (LOPRESSOR) 25 MG tablet Take 25 mg by mouth 2 (two) times daily.             omeprazole (PRILOSEC) 20 MG capsule Take 20 mg by mouth daily.             Glucosamine-Chondroit-Vit C-Mn (GLUCOSAMINE 1500 COMPLEX) CAPS Take 1 capsule by mouth daily.            Omega-3 Fatty Acids (FISH OIL) 1000 MG CPDR Take 1 capsule by mouth daily.            VITAMIN D, CHOLECALCIFEROL, PO Take 2,000 Int'l Units by mouth daily.             acetaminophen (TYLENOL) 650 MG CR tablet Take 650 mg by mouth 2 (two) times daily.            furosemide (LASIX) 20 MG tablet Take 20 mg by mouth daily.             silodosin (RAPAFLO) 4 MG CAPS capsule Take 4 mg by mouth at bedtime.             nitroGLYCERIN (NITROSTAT) 0.4 MG SL tablet Place 0.4 mg under the tongue every 5 (five) minutes as needed. For chest pain           warfarin (COUMADIN)  5  MG tablet Take 5 mg by mouth daily. MWF=1 tablet  All other days = 1/2 tablet   Last dose taken 07/22/11 per patient           oxyCODONE-acetaminophen (ROXICET) 5-325 MG per tablet Take 1 tablet by mouth every 4 (four) hours as needed for pain.           oxyCODONE-acetaminophen (PERCOCET) 5-325 MG per tablet Take 1 tablet by mouth every 4 (four) hours as needed for pain.             Disposition: home  Patient's condition: is Good   Newton Pigg, PA-C [facility] [phone] 07/29/2011  7:31 AM  Addendum  I have independently interviewed and examined the patient, and I agree with the physician assistant's findings.  Adam [name] had an uneventful percutaneous EVAR without obvious complications at this point.  He is stable for D/C home this AM and will follow up in the office in 1 month.  Leonides Sake, MD [facility] [place] Office: [phone] Pager: [phone]  07/29/2011, 9:51 AM

## 2011-07-29 NOTE — Progress Notes (Signed)
Pt discharged home per MD order. All instructions given and forms signed. Pt given copy and one placed in chart. Pt given information on stent and pain med script as well. Wife at bedside during d/c instructions. VSS. All IV's removed pt tolerated well.  There are no signs of infection of bleeding at surgical or IV sites.

## 2011-07-29 NOTE — Progress Notes (Addendum)
[facility] Progress Note  07/29/2011 7:11 AM POD 1  No complaints  Filed Vitals:   07/29/11 0400  BP: 106/50  Pulse: 74  Temp: 98.2 F (36.8 C)  Resp: 19    Incisions:  Bilateral groins soft without hematoma Extremities:  Bilateral palpable DP pulses Abdomen:  Soft, NT/ND with +BS Lungs:  CTAB Cardiac:  Reg  CBC    Component Value Date/Time   WBC 9.5 07/29/2011 0510   RBC 3.29* 07/29/2011 0510   HGB 11.0* 07/29/2011 0510   HCT 32.2* 07/29/2011 0510   PLT 92* 07/29/2011 0510   MCV 97.9 07/29/2011 0510   MCH 33.4 07/29/2011 0510   MCHC 34.2 07/29/2011 0510   RDW 13.7 07/29/2011 0510   LYMPHSABS 2.1 07/24/2011 1030   MONOABS 0.9 07/24/2011 1030   EOSABS 0.1 07/24/2011 1030   BASOSABS 0.0 07/24/2011 1030    BMET    Component Value Date/Time   NA 139 07/24/2011 1030   K 4.2 07/24/2011 1030   CL 104 07/24/2011 1030   CO2 23 07/24/2011 1030   GLUCOSE 106* 07/24/2011 1030   BUN 21 07/24/2011 1030   CREATININE 0.78 07/24/2011 1030   CREATININE 1.03 05/04/2011 1113   CALCIUM 9.4 07/24/2011 1030   GFRNONAA 81* 07/24/2011 1030   GFRAA >90 07/24/2011 1030    INR    Component Value Date/Time   INR 1.26 07/29/2011 0510   INR 2.8 12/26/2010 1149     Intake/Output Summary (Last 24 hours) at 07/29/11 0711 Last data filed at 07/29/11 0500  Gross per 24 hour  Intake   3525 ml  Output   3400 ml  Net    125 ml    Assessment/Plan:  77 y.o. male is s/p: 1.  Bilateral common femoral artery cannulation under ultrasound guidance 2. "Preclose" repair bilateral common femoral artery  3. Placement of catheter in aorta 4. Aortogram 5. Repair of aorta with modular bifurcated prosthesis with one limb 6. Radiology S&I   POD 1  -Looks good this am and doing well. -restart coumadin today (on coumadin for PAF-he is is NSR this am).  Will have him get his INR checked on Friday. -Home today.  Follow up with Dr. Imogene Burn in one month.   Newton Pigg,  PA-C [facility] [phone] 07/29/2011 7:11 AM  Addendum  I have independently interviewed and examined the patient, and I agree with the physician assistant's findings.  No further abdominal pulsatile, groins appropriate, and warm feet B.  Patient can be d/c.  Leonides Sake, MD [facility] [place] Office: [phone] Pager: [phone]  07/29/2011, 8:16 AM

## 2011-07-30 ENCOUNTER — Encounter (HOSPITAL_COMMUNITY): Payer: Self-pay | Admitting: Vascular Surgery

## 2011-07-30 ENCOUNTER — Encounter: Payer: Medicare Other | Admitting: *Deleted

## 2011-08-03 ENCOUNTER — Ambulatory Visit (INDEPENDENT_AMBULATORY_CARE_PROVIDER_SITE_OTHER): Payer: Medicare Other | Admitting: *Deleted

## 2011-08-03 ENCOUNTER — Encounter: Payer: Self-pay | Admitting: Internal Medicine

## 2011-08-03 ENCOUNTER — Encounter: Payer: Self-pay | Admitting: *Deleted

## 2011-08-03 ENCOUNTER — Other Ambulatory Visit: Payer: Self-pay | Admitting: Internal Medicine

## 2011-08-03 DIAGNOSIS — I495 Sick sinus syndrome: Secondary | ICD-10-CM

## 2011-08-05 LAB — REMOTE PACEMAKER DEVICE
BAMS-0001: 165 {beats}/min
BATTERY VOLTAGE: 2.96 V

## 2011-08-14 NOTE — Progress Notes (Signed)
PPM remote 

## 2011-08-21 ENCOUNTER — Encounter: Payer: Self-pay | Admitting: *Deleted

## 2011-08-24 DIAGNOSIS — I4891 Unspecified atrial fibrillation: Secondary | ICD-10-CM | POA: Diagnosis not present

## 2011-08-24 DIAGNOSIS — Z7901 Long term (current) use of anticoagulants: Secondary | ICD-10-CM | POA: Diagnosis not present

## 2011-09-03 ENCOUNTER — Encounter: Payer: Self-pay | Admitting: Vascular Surgery

## 2011-09-04 ENCOUNTER — Ambulatory Visit
Admission: RE | Admit: 2011-09-04 | Discharge: 2011-09-04 | Disposition: A | Discharge status: Home / Self Care | Payer: Medicare Other | Source: Ambulatory Visit | Attending: Thoracic Diseases | Admitting: Thoracic Diseases

## 2011-09-04 ENCOUNTER — Encounter: Payer: Self-pay | Admitting: Vascular Surgery

## 2011-09-04 ENCOUNTER — Ambulatory Visit (INDEPENDENT_AMBULATORY_CARE_PROVIDER_SITE_OTHER): Payer: Medicare Other | Admitting: Vascular Surgery

## 2011-09-04 ENCOUNTER — Other Ambulatory Visit: Payer: Medicare Other

## 2011-09-04 ENCOUNTER — Ambulatory Visit: Payer: Medicare Other | Admitting: Vascular Surgery

## 2011-09-04 VITALS — BP 128/83 | HR 78 | Resp 16 | Ht 68.0 in | Wt 154.0 lb

## 2011-09-04 DIAGNOSIS — I714 Abdominal aortic aneurysm, without rupture, unspecified: Secondary | ICD-10-CM | POA: Insufficient documentation

## 2011-09-04 DIAGNOSIS — I711 Thoracic aortic aneurysm, ruptured, unspecified: Secondary | ICD-10-CM | POA: Insufficient documentation

## 2011-09-04 MED ORDER — IOHEXOL 350 MG/ML SOLN
100.0000 mL | Freq: Once | INTRAVENOUS | Status: AC | PRN
  Administered 2011-09-04: 100 mL via INTRAVENOUS

## 2011-09-07 NOTE — Progress Notes (Signed)
[facility] [place]  Post-operative EVAR  History of Present Illness  BLANCHARD WILLHITE is a 78 y.o. male who presents post-op s/p EVAR (Date: 07/28/11).  The patient has no had back or abdominal pain.  Past Medical History, Past Surgical History, Social History, Family History, Medications, Allergies, and Review of Systems are unchanged from previous evaluation on 07/28/11.  Physical Examination  Filed Vitals:   09/04/11 1320  BP: 128/83  Pulse: 78  Resp: 16    Vascular: Vessel Right Left  Radial Palpable Palpable  [place] Palpable Palpable  Brachial Palpable Palpable  Carotid Palpable, without bruit Palpable, without bruit  Aorta Non-palpable N/A  Femoral Palpable Palpable  Popliteal Non-palpable Non-palpable  PT Palpable Palpable  DP Palpable Palpable   Gastrointestinal: soft, NTND, -G/R, - HSM, - masses, - CVAT B, B groin punctures well healed  Non-Invasive Vascular Imaging  CTA Abd & pelvis (Date: 09/04/11)  Status post aortobi-iliac stent graft repair for abdominal aortic aneurysm and no evidence of Endo leak or complication.   Stable aneurysm sac diameter at 4.9 cm.  Based on my read of the CTA, the pt has an appropriately positioned endograft without any evidence of endoleak or migration.  Both limbs are widely patent  Medical Decision Making  MARCIANO MUNDT is a 78 y.o. male who presents s/p EVAR.  I discussed with the patient the importance of surveillance of the endograft.  The next endograft duplex will be scheduled for 3 months.  The patient will follow up with [place] in 3 months with these studies.  Thank you for allowing [place] to participate in this patient's care.  Leonides Sake, MD [facility] [place] Office: [phone] Pager: [phone]

## 2011-09-15 ENCOUNTER — Other Ambulatory Visit: Payer: Self-pay | Admitting: Cardiology

## 2011-09-15 ENCOUNTER — Other Ambulatory Visit: Payer: Self-pay | Admitting: *Deleted

## 2011-09-15 MED ORDER — METOPROLOL TARTRATE 25 MG PO TABS: 25.0000 mg | ORAL_TABLET | Freq: Two times a day (BID) | ORAL | Status: DC

## 2011-09-23 DIAGNOSIS — I4891 Unspecified atrial fibrillation: Secondary | ICD-10-CM | POA: Diagnosis not present

## 2011-09-23 DIAGNOSIS — Z7901 Long term (current) use of anticoagulants: Secondary | ICD-10-CM | POA: Diagnosis not present

## 2011-10-06 ENCOUNTER — Ambulatory Visit (INDEPENDENT_AMBULATORY_CARE_PROVIDER_SITE_OTHER): Payer: Medicare Other | Admitting: Internal Medicine

## 2011-10-06 ENCOUNTER — Encounter: Payer: Self-pay | Admitting: Internal Medicine

## 2011-10-06 ENCOUNTER — Encounter: Payer: Medicare Other | Admitting: Internal Medicine

## 2011-10-06 DIAGNOSIS — Z950 Presence of cardiac pacemaker: Secondary | ICD-10-CM

## 2011-10-06 DIAGNOSIS — I4891 Unspecified atrial fibrillation: Secondary | ICD-10-CM | POA: Diagnosis not present

## 2011-10-06 DIAGNOSIS — I119 Hypertensive heart disease without heart failure: Secondary | ICD-10-CM

## 2011-10-06 LAB — PACEMAKER DEVICE OBSERVATION
AL IMPEDENCE PM: 464 Ohm
BAMS-0001: 165 {beats}/min
BRDY-0003RV: 120 {beats}/min
RV LEAD AMPLITUDE: 11.7 mv
RV LEAD THRESHOLD: 1 V

## 2011-10-06 NOTE — Patient Instructions (Signed)
Your physician has recommended you make the following change in your medication:  1) Stop amlodipine. 2) Increase lasix (furosemide) to 20 mg two tablets by mouth once daily.  You have been referred to: Dr. Johney Frame for consideration of ablation for atrial fibrillation.  Remote monitoring is used to monitor your Pacemaker of ICD from home. This monitoring reduces the number of office visits required to check your device to one time per year. It allows [place] to keep an eye on the functioning of your device to ensure it is working properly. You are scheduled for a device check from home on 01/07/12. You may send your transmission at any time that day. If you have a wireless device, the transmission will be sent automatically. After your physician reviews your transmission, you will receive a postcard with your next transmission date.   Your physician wants you to follow-up in: 1 year with Dr. Graciela Husbands. You will receive a reminder letter in the mail two months in advance. If you don't receive a letter, please call our office to schedule the follow-up appointment.

## 2011-10-06 NOTE — Assessment & Plan Note (Signed)
The patient's device was interrogated.  The information was reviewed. No changes were made in the programming.    

## 2011-10-06 NOTE — Assessment & Plan Note (Signed)
Well-controlled. His amlodipine may be contributing to his edema. He is to see Dr. Katrinka Blazing next week so I have taken the liberty of undertaking a drug variation regime.  He will discontinue his amlodipine and increase his Lasix from 20-40 and hopefully this will have an impact on his quite considerable edema

## 2011-10-06 NOTE — Progress Notes (Signed)
HPI  Adam [name] is a 78 y.o. male Seen to establish his pacemaker followup.  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 He has bilateral peripheral edema. Echo 2008 demonstrated ejection fraction of 60%   Past Medical History  Diagnosis Date  . S/P CABG (coronary artery bypass graft)   . Hyperlipidemia   . GERD (gastroesophageal reflux disease)   . Presence of permanent cardiac pacemaker   . Chronic anticoagulation   . LBP (low back pain)   . AAA (abdominal aortic aneurysm)   . Diverticulosis   . Arthritis   . 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 PAF (paroxysmal atrial fibrillation)     Cardioversion June 2011 & IN 2012    Past Surgical History  Procedure Date  . Coronary artery bypass graft Feb 1989  . Permanent pacemaker July 2008  . Tonsillectomy   . Cataract extraction   . Joint replacement 1995 & 1996    Bilateral total knee replacements  . Endovascular stent insertion 07/28/2011    Procedure: ENDOVASCULAR STENT GRAFT INSERTION;  Surgeon: Nilda Simmer, MD;  Location: [facility] OR;  Service: Vascular;  Laterality: N/A;  Pt. on Coumadin/ instructed to hold 5 days prior to procedure    Current Outpatient Prescriptions  Medication Sig Dispense Refill  . acetaminophen (TYLENOL) 650 MG CR tablet Take 650 mg by mouth 2 (two) times daily.       [name] amLODipine (NORVASC) 5 MG tablet Take 5 mg by mouth daily.        [name] atorvastatin (LIPITOR) 10 MG tablet Take 10 mg by mouth daily.        [name] azithromycin (ZITHROMAX) 250 MG tablet       . fluticasone (FLONASE) 50 MCG/ACT nasal spray       . furosemide (LASIX) 20 MG tablet Take 20 mg by mouth daily.        . Glucosamine-Chondroit-Vit C-Mn (GLUCOSAMINE 1500 COMPLEX) CAPS Take 1 capsule by mouth daily.       . metoprolol tartrate (LOPRESSOR)  25 MG tablet Take 1 tablet (25 mg total) by mouth 2 (two) times daily.  180 tablet  1  . mupirocin ointment (BACTROBAN) 2 %       . nitroGLYCERIN (NITROSTAT) 0.4 MG SL tablet Place 0.4 mg under the tongue every 5 (five) minutes as needed. For chest pain      . Omega-3 Fatty Acids (FISH OIL) 1000 MG CPDR Take 1 capsule by mouth daily.       [name] omeprazole (PRILOSEC) 20 MG capsule Take 20 mg by mouth daily.        . silodosin (RAPAFLO) 4 MG CAPS capsule Take 4 mg by mouth at bedtime.        [name] VITAMIN D, CHOLECALCIFEROL, PO Take 2,000 Int'l Units by mouth daily.        [name] warfarin (COUMADIN) 5 MG tablet Take 5 mg by mouth daily. MWF=1 tablet  All other days = 1/2 tablet   Last dose taken 07/22/11 per patient        Allergies  Allergen Reactions  . Morphine Nausea Only    Review of Systems negative except from HPI and PMH  Physical Exam There were no vitals taken for this visit. Well developed and well nourished in  no acute distress HENT normal E scleral and icterus clear Neck Supple JVP 12 cm; carotids brisk and full Clear to ausculation  Regular rate and rhythm, no murmurs gallops or rub Soft with active bowel sounds No clubbing cyanosis 3+ Edema Alert and oriented, grossly normal motor and sensory function Skin Warm and Dry   Assessment and  Plan

## 2011-10-06 NOTE — Assessment & Plan Note (Signed)
Permanent. He would like to consider catheter ablation. We discussed the importance of understanding left atrial size and so he would need a new echo. We also discussed the value of being in sinus rhythm and the potential role of a trial of dofetilide. I have arranged at his request an appointment with Dr. Johney Frame to discuss catheter ablation more completely

## 2011-10-13 DIAGNOSIS — I714 Abdominal aortic aneurysm, without rupture, unspecified: Secondary | ICD-10-CM | POA: Diagnosis not present

## 2011-10-13 DIAGNOSIS — Z950 Presence of cardiac pacemaker: Secondary | ICD-10-CM | POA: Diagnosis not present

## 2011-10-13 DIAGNOSIS — I10 Essential (primary) hypertension: Secondary | ICD-10-CM | POA: Diagnosis not present

## 2011-10-13 DIAGNOSIS — I4891 Unspecified atrial fibrillation: Secondary | ICD-10-CM | POA: Diagnosis not present

## 2011-10-13 DIAGNOSIS — Z7901 Long term (current) use of anticoagulants: Secondary | ICD-10-CM | POA: Diagnosis not present

## 2011-10-13 DIAGNOSIS — I25810 Atherosclerosis of coronary artery bypass graft(s) without angina pectoris: Secondary | ICD-10-CM | POA: Diagnosis not present

## 2011-11-11 DIAGNOSIS — I10 Essential (primary) hypertension: Secondary | ICD-10-CM | POA: Diagnosis not present

## 2011-11-11 DIAGNOSIS — Z7901 Long term (current) use of anticoagulants: Secondary | ICD-10-CM | POA: Diagnosis not present

## 2011-11-11 DIAGNOSIS — I4891 Unspecified atrial fibrillation: Secondary | ICD-10-CM | POA: Diagnosis not present

## 2011-12-08 DIAGNOSIS — H40053 Ocular hypertension, bilateral: Secondary | ICD-10-CM | POA: Insufficient documentation

## 2011-12-08 DIAGNOSIS — H35369 Drusen (degenerative) of macula, unspecified eye: Secondary | ICD-10-CM | POA: Insufficient documentation

## 2011-12-08 DIAGNOSIS — Z961 Presence of intraocular lens: Secondary | ICD-10-CM | POA: Insufficient documentation

## 2011-12-08 DIAGNOSIS — H43819 Vitreous degeneration, unspecified eye: Secondary | ICD-10-CM | POA: Insufficient documentation

## 2011-12-09 DIAGNOSIS — H353132 Nonexudative age-related macular degeneration, bilateral, intermediate dry stage: Secondary | ICD-10-CM | POA: Insufficient documentation

## 2011-12-09 DIAGNOSIS — H40059 Ocular hypertension, unspecified eye: Secondary | ICD-10-CM | POA: Diagnosis not present

## 2011-12-09 DIAGNOSIS — H2640 Unspecified secondary cataract: Secondary | ICD-10-CM | POA: Diagnosis not present

## 2011-12-09 DIAGNOSIS — H35369 Drusen (degenerative) of macula, unspecified eye: Secondary | ICD-10-CM | POA: Diagnosis not present

## 2011-12-09 DIAGNOSIS — H02839 Dermatochalasis of unspecified eye, unspecified eyelid: Secondary | ICD-10-CM | POA: Diagnosis not present

## 2011-12-09 DIAGNOSIS — Z961 Presence of intraocular lens: Secondary | ICD-10-CM | POA: Diagnosis not present

## 2011-12-09 DIAGNOSIS — H353190 Nonexudative age-related macular degeneration, unspecified eye, stage unspecified: Secondary | ICD-10-CM | POA: Diagnosis not present

## 2011-12-09 DIAGNOSIS — H43819 Vitreous degeneration, unspecified eye: Secondary | ICD-10-CM | POA: Diagnosis not present

## 2011-12-10 ENCOUNTER — Encounter: Payer: Self-pay | Admitting: Vascular Surgery

## 2011-12-11 ENCOUNTER — Encounter: Payer: Self-pay | Admitting: Vascular Surgery

## 2011-12-11 ENCOUNTER — Other Ambulatory Visit (INDEPENDENT_AMBULATORY_CARE_PROVIDER_SITE_OTHER): Payer: Medicare Other | Admitting: *Deleted

## 2011-12-11 ENCOUNTER — Ambulatory Visit (INDEPENDENT_AMBULATORY_CARE_PROVIDER_SITE_OTHER): Payer: Medicare Other | Admitting: Vascular Surgery

## 2011-12-11 VITALS — BP 122/82 | HR 92 | Resp 20 | Ht 68.0 in | Wt 155.0 lb

## 2011-12-11 DIAGNOSIS — I714 Abdominal aortic aneurysm, without rupture, unspecified: Secondary | ICD-10-CM | POA: Diagnosis not present

## 2011-12-11 DIAGNOSIS — Z48812 Encounter for surgical aftercare following surgery on the circulatory system: Secondary | ICD-10-CM | POA: Diagnosis not present

## 2011-12-11 DIAGNOSIS — Z9889 Other specified postprocedural states: Secondary | ICD-10-CM | POA: Diagnosis not present

## 2011-12-11 DIAGNOSIS — Z8679 Personal history of other diseases of the circulatory system: Secondary | ICD-10-CM | POA: Insufficient documentation

## 2011-12-11 NOTE — Progress Notes (Signed)
[facility] [place]  Established EVAR  History of Present Illness  Adam [name] is a 78 y.o. (23-Mar-1925) male who presents for routine follow up s/p EVAR (Date: 07/28/11).  Most recent EVAR duplex (Date: 12/11/11) demonstrates: possible small type II endoleak and decreasing sac size. Most recent CT (Date: 08/27/11) demonstrates: no endoleak and stable sac size.  The patient has not had back or abdominal pain.  Past Medical History, Past Surgical History, Social History, Family History, Medications, Allergies, and Review of Systems are unchanged from previous evaluation on 09/04/11.  Physical Examination  Filed Vitals:   12/11/11 0951  BP: 122/82  Pulse: 92  Resp: 20  Height: 5\' 8"  (1.727 m)  Weight: 155 lb (70.308 kg)   Body mass index is 23.57 kg/(m^2).  General: A&O x 3, WDWN  Eyes: PERRLA, EOMI  Pulmonary: Sym exp, good air movt, CTAB, no rales, rhonchi, & wheezing  Cardiac: RRR, Nl S1, S2, no Murmurs, rubs or gallops  Vascular: Vessel Right Left  Radial Palpable Palpable  Brachial Palpable Palpable  Carotid Palpable, without bruit Palpable, without bruit  Aorta Non-palpable N/A  Femoral Palpable Palpable  Popliteal Non-palpable Non-palpable  PT Palpable Palpable  DP Palpable Palpable   Gastrointestinal: soft, NTND, -G/R, - HSM, - masses, - CVAT B  Musculoskeletal: M/S 5/5 throughout , Extremities without ischemic changes   Neurologic: Pain and light touch intact in extremities , Motor exam as listed above  Non-Invasive Vascular Imaging  EVAR Duplex (Date: 12/11/11)  AAA sac size: 4.72 cm x 4.75 cm (4.9 cm on CT)  Possible small type II endoleak detected, difficult to evaluated due to bowel gas  Medical Decision Making  Adam [name] is a 78 y.o. male who presents s/p EVAR.  Pt is asymptomatic with decreasing sac size.  I discussed with the patient the importance of surveillance of the endograft.  The next CTA will be scheduled  for for Dec 2013 months.  The patient will follow up with [place] after that study.  Thank you for allowing [place] to participate in this patient's care.  Leonides Sake, MD [facility] [place] Office: [phone] Pager: [phone]  12/11/2011, 5:38 PM

## 2011-12-16 DIAGNOSIS — Z7901 Long term (current) use of anticoagulants: Secondary | ICD-10-CM | POA: Diagnosis not present

## 2011-12-16 DIAGNOSIS — I4891 Unspecified atrial fibrillation: Secondary | ICD-10-CM | POA: Diagnosis not present

## 2011-12-16 NOTE — Procedures (Unsigned)
VASCULAR LAB EXAM  INDICATION:  Followup AAA endograft placed 07/28/2011  HISTORY: Diabetes:  No Cardiac: Hypertension:  Yes  EXAM:  AAA sac size 4.72 cm AP, 4.75 cm transverse Previous sac size 09/04/2011 (by CT scan) 4.9 cm AP  IMPRESSION: 1. The aorta and endograft appear patent. 2. No significant change in size of the aneurysmal sac surrounding the     endograft. 3. There appears to be a small type 2 endoleak, however, this is     difficult to evaluate due to bowel gas.  ___________________________________________ Fransisco Hertz, MD  LT/MEDQ  D:  12/11/2011  T:  12/11/2011  Job:  [id number]

## 2012-01-07 ENCOUNTER — Encounter: Payer: Self-pay | Admitting: Internal Medicine

## 2012-01-07 ENCOUNTER — Ambulatory Visit (INDEPENDENT_AMBULATORY_CARE_PROVIDER_SITE_OTHER): Payer: Medicare Other | Admitting: *Deleted

## 2012-01-07 ENCOUNTER — Telehealth: Payer: Self-pay | Admitting: Internal Medicine

## 2012-01-07 DIAGNOSIS — I4891 Unspecified atrial fibrillation: Secondary | ICD-10-CM | POA: Diagnosis not present

## 2012-01-07 NOTE — Telephone Encounter (Signed)
Pt wants to confirm his transmission went through and he will be waiting on a call back

## 2012-01-07 NOTE — Telephone Encounter (Signed)
Spoke w/pt to let know transmission was received.  

## 2012-01-08 DIAGNOSIS — L570 Actinic keratosis: Secondary | ICD-10-CM | POA: Diagnosis not present

## 2012-01-08 DIAGNOSIS — D485 Neoplasm of uncertain behavior of skin: Secondary | ICD-10-CM | POA: Diagnosis not present

## 2012-01-08 DIAGNOSIS — D2320 Other benign neoplasm of skin of unspecified ear and external auricular canal: Secondary | ICD-10-CM | POA: Diagnosis not present

## 2012-01-08 DIAGNOSIS — D239 Other benign neoplasm of skin, unspecified: Secondary | ICD-10-CM | POA: Diagnosis not present

## 2012-01-08 LAB — REMOTE PACEMAKER DEVICE
AL IMPEDENCE PM: 440 Ohm
BAMS-0001: 165 {beats}/min
BATTERY VOLTAGE: 2.96 V
RV LEAD IMPEDENCE PM: 368 Ohm

## 2012-01-18 ENCOUNTER — Encounter: Payer: Self-pay | Admitting: *Deleted

## 2012-01-19 DIAGNOSIS — Z79899 Other long term (current) drug therapy: Secondary | ICD-10-CM | POA: Diagnosis not present

## 2012-01-19 DIAGNOSIS — I25810 Atherosclerosis of coronary artery bypass graft(s) without angina pectoris: Secondary | ICD-10-CM | POA: Diagnosis not present

## 2012-01-19 DIAGNOSIS — I4891 Unspecified atrial fibrillation: Secondary | ICD-10-CM | POA: Diagnosis not present

## 2012-01-19 DIAGNOSIS — Z7901 Long term (current) use of anticoagulants: Secondary | ICD-10-CM | POA: Diagnosis not present

## 2012-01-19 DIAGNOSIS — I10 Essential (primary) hypertension: Secondary | ICD-10-CM | POA: Diagnosis not present

## 2012-01-27 DIAGNOSIS — Z7901 Long term (current) use of anticoagulants: Secondary | ICD-10-CM | POA: Diagnosis not present

## 2012-01-27 DIAGNOSIS — I4891 Unspecified atrial fibrillation: Secondary | ICD-10-CM | POA: Diagnosis not present

## 2012-03-10 DIAGNOSIS — I10 Essential (primary) hypertension: Secondary | ICD-10-CM | POA: Insufficient documentation

## 2012-03-10 DIAGNOSIS — I2510 Atherosclerotic heart disease of native coronary artery without angina pectoris: Secondary | ICD-10-CM | POA: Insufficient documentation

## 2012-03-10 DIAGNOSIS — K5790 Diverticulosis of intestine, part unspecified, without perforation or abscess without bleeding: Secondary | ICD-10-CM | POA: Insufficient documentation

## 2012-03-10 DIAGNOSIS — K219 Gastro-esophageal reflux disease without esophagitis: Secondary | ICD-10-CM | POA: Insufficient documentation

## 2012-03-11 DIAGNOSIS — I4891 Unspecified atrial fibrillation: Secondary | ICD-10-CM | POA: Diagnosis not present

## 2012-03-11 DIAGNOSIS — Z7901 Long term (current) use of anticoagulants: Secondary | ICD-10-CM | POA: Diagnosis not present

## 2012-03-13 ENCOUNTER — Other Ambulatory Visit: Payer: Self-pay | Admitting: Nurse Practitioner

## 2012-03-14 ENCOUNTER — Other Ambulatory Visit (HOSPITAL_COMMUNITY): Payer: Medicare Other

## 2012-03-14 ENCOUNTER — Institutional Professional Consult (permissible substitution): Payer: Medicare Other | Admitting: Internal Medicine

## 2012-03-14 DIAGNOSIS — Z950 Presence of cardiac pacemaker: Secondary | ICD-10-CM | POA: Diagnosis not present

## 2012-03-14 DIAGNOSIS — I714 Abdominal aortic aneurysm, without rupture, unspecified: Secondary | ICD-10-CM | POA: Diagnosis not present

## 2012-03-14 DIAGNOSIS — E785 Hyperlipidemia, unspecified: Secondary | ICD-10-CM | POA: Insufficient documentation

## 2012-03-14 DIAGNOSIS — Z7901 Long term (current) use of anticoagulants: Secondary | ICD-10-CM | POA: Diagnosis not present

## 2012-03-14 DIAGNOSIS — I4891 Unspecified atrial fibrillation: Secondary | ICD-10-CM | POA: Diagnosis not present

## 2012-03-29 DIAGNOSIS — I252 Old myocardial infarction: Secondary | ICD-10-CM | POA: Diagnosis not present

## 2012-03-29 DIAGNOSIS — I4891 Unspecified atrial fibrillation: Secondary | ICD-10-CM | POA: Diagnosis not present

## 2012-03-29 DIAGNOSIS — K5730 Diverticulosis of large intestine without perforation or abscess without bleeding: Secondary | ICD-10-CM | POA: Diagnosis present

## 2012-03-29 DIAGNOSIS — Z951 Presence of aortocoronary bypass graft: Secondary | ICD-10-CM | POA: Diagnosis not present

## 2012-03-29 DIAGNOSIS — Z7901 Long term (current) use of anticoagulants: Secondary | ICD-10-CM | POA: Diagnosis not present

## 2012-03-29 DIAGNOSIS — I2510 Atherosclerotic heart disease of native coronary artery without angina pectoris: Secondary | ICD-10-CM | POA: Diagnosis present

## 2012-03-29 DIAGNOSIS — I714 Abdominal aortic aneurysm, without rupture, unspecified: Secondary | ICD-10-CM | POA: Diagnosis present

## 2012-03-29 DIAGNOSIS — E785 Hyperlipidemia, unspecified: Secondary | ICD-10-CM | POA: Diagnosis present

## 2012-03-29 DIAGNOSIS — Z950 Presence of cardiac pacemaker: Secondary | ICD-10-CM | POA: Diagnosis not present

## 2012-03-29 DIAGNOSIS — Z79899 Other long term (current) drug therapy: Secondary | ICD-10-CM | POA: Diagnosis not present

## 2012-03-29 DIAGNOSIS — I10 Essential (primary) hypertension: Secondary | ICD-10-CM | POA: Diagnosis present

## 2012-03-29 DIAGNOSIS — K219 Gastro-esophageal reflux disease without esophagitis: Secondary | ICD-10-CM | POA: Diagnosis present

## 2012-03-31 DIAGNOSIS — Z950 Presence of cardiac pacemaker: Secondary | ICD-10-CM | POA: Diagnosis not present

## 2012-03-31 DIAGNOSIS — I4891 Unspecified atrial fibrillation: Secondary | ICD-10-CM | POA: Diagnosis not present

## 2012-03-31 DIAGNOSIS — I499 Cardiac arrhythmia, unspecified: Secondary | ICD-10-CM | POA: Insufficient documentation

## 2012-03-31 DIAGNOSIS — I219 Acute myocardial infarction, unspecified: Secondary | ICD-10-CM | POA: Insufficient documentation

## 2012-04-05 DIAGNOSIS — E876 Hypokalemia: Secondary | ICD-10-CM | POA: Diagnosis not present

## 2012-04-08 DIAGNOSIS — D485 Neoplasm of uncertain behavior of skin: Secondary | ICD-10-CM | POA: Diagnosis not present

## 2012-04-08 DIAGNOSIS — L570 Actinic keratosis: Secondary | ICD-10-CM | POA: Diagnosis not present

## 2012-04-11 DIAGNOSIS — N320 Bladder-neck obstruction: Secondary | ICD-10-CM | POA: Diagnosis not present

## 2012-04-11 DIAGNOSIS — N400 Enlarged prostate without lower urinary tract symptoms: Secondary | ICD-10-CM | POA: Diagnosis not present

## 2012-04-12 DIAGNOSIS — Z7901 Long term (current) use of anticoagulants: Secondary | ICD-10-CM | POA: Diagnosis not present

## 2012-04-12 DIAGNOSIS — Z950 Presence of cardiac pacemaker: Secondary | ICD-10-CM | POA: Diagnosis not present

## 2012-04-12 DIAGNOSIS — I10 Essential (primary) hypertension: Secondary | ICD-10-CM | POA: Diagnosis not present

## 2012-04-12 DIAGNOSIS — I4891 Unspecified atrial fibrillation: Secondary | ICD-10-CM | POA: Diagnosis not present

## 2012-04-12 DIAGNOSIS — Z79899 Other long term (current) drug therapy: Secondary | ICD-10-CM | POA: Diagnosis not present

## 2012-04-12 DIAGNOSIS — I25810 Atherosclerosis of coronary artery bypass graft(s) without angina pectoris: Secondary | ICD-10-CM | POA: Diagnosis not present

## 2012-04-14 ENCOUNTER — Encounter: Payer: Self-pay | Admitting: Internal Medicine

## 2012-04-14 ENCOUNTER — Ambulatory Visit (INDEPENDENT_AMBULATORY_CARE_PROVIDER_SITE_OTHER): Payer: Medicare Other | Admitting: *Deleted

## 2012-04-14 DIAGNOSIS — I4891 Unspecified atrial fibrillation: Secondary | ICD-10-CM

## 2012-04-14 DIAGNOSIS — Z950 Presence of cardiac pacemaker: Secondary | ICD-10-CM | POA: Diagnosis not present

## 2012-04-15 LAB — REMOTE PACEMAKER DEVICE
AL AMPLITUDE: 3.8 mv
AL IMPEDENCE PM: 408 Ohm
BAMS-0001: 165 {beats}/min
BATTERY VOLTAGE: 2.96 V
RV LEAD IMPEDENCE PM: 384 Ohm

## 2012-04-20 ENCOUNTER — Encounter: Payer: Self-pay | Admitting: *Deleted

## 2012-04-22 DIAGNOSIS — I4891 Unspecified atrial fibrillation: Secondary | ICD-10-CM | POA: Diagnosis not present

## 2012-04-22 DIAGNOSIS — Z7901 Long term (current) use of anticoagulants: Secondary | ICD-10-CM | POA: Diagnosis not present

## 2012-05-04 DIAGNOSIS — I4891 Unspecified atrial fibrillation: Secondary | ICD-10-CM | POA: Diagnosis not present

## 2012-05-04 DIAGNOSIS — Z7901 Long term (current) use of anticoagulants: Secondary | ICD-10-CM | POA: Diagnosis not present

## 2012-05-09 DIAGNOSIS — I4891 Unspecified atrial fibrillation: Secondary | ICD-10-CM | POA: Diagnosis not present

## 2012-05-09 DIAGNOSIS — E785 Hyperlipidemia, unspecified: Secondary | ICD-10-CM | POA: Diagnosis not present

## 2012-05-09 DIAGNOSIS — Z79899 Other long term (current) drug therapy: Secondary | ICD-10-CM | POA: Diagnosis not present

## 2012-05-09 DIAGNOSIS — I10 Essential (primary) hypertension: Secondary | ICD-10-CM | POA: Diagnosis not present

## 2012-05-09 DIAGNOSIS — I25810 Atherosclerosis of coronary artery bypass graft(s) without angina pectoris: Secondary | ICD-10-CM | POA: Diagnosis not present

## 2012-05-25 DIAGNOSIS — Z23 Encounter for immunization: Secondary | ICD-10-CM | POA: Diagnosis not present

## 2012-06-01 DIAGNOSIS — Z7901 Long term (current) use of anticoagulants: Secondary | ICD-10-CM | POA: Diagnosis not present

## 2012-06-01 DIAGNOSIS — I4891 Unspecified atrial fibrillation: Secondary | ICD-10-CM | POA: Diagnosis not present

## 2012-06-27 DIAGNOSIS — I4891 Unspecified atrial fibrillation: Secondary | ICD-10-CM | POA: Diagnosis not present

## 2012-06-27 DIAGNOSIS — Z7901 Long term (current) use of anticoagulants: Secondary | ICD-10-CM | POA: Diagnosis not present

## 2012-06-27 DIAGNOSIS — I25810 Atherosclerosis of coronary artery bypass graft(s) without angina pectoris: Secondary | ICD-10-CM | POA: Diagnosis not present

## 2012-06-27 DIAGNOSIS — Z79899 Other long term (current) drug therapy: Secondary | ICD-10-CM | POA: Diagnosis not present

## 2012-06-27 DIAGNOSIS — I10 Essential (primary) hypertension: Secondary | ICD-10-CM | POA: Diagnosis not present

## 2012-06-28 ENCOUNTER — Other Ambulatory Visit: Payer: Self-pay | Admitting: Interventional Cardiology

## 2012-06-30 ENCOUNTER — Ambulatory Visit (HOSPITAL_COMMUNITY)
Admission: RE | Admit: 2012-06-30 | Discharge: 2012-06-30 | Disposition: A | Discharge status: Home / Self Care | Payer: Medicare Other | Source: Ambulatory Visit | Attending: Interventional Cardiology | Admitting: Interventional Cardiology

## 2012-06-30 ENCOUNTER — Ambulatory Visit (HOSPITAL_COMMUNITY): Payer: Medicare Other | Admitting: Certified Registered Nurse Anesthetist

## 2012-06-30 ENCOUNTER — Encounter (HOSPITAL_COMMUNITY): Payer: Self-pay | Admitting: Certified Registered Nurse Anesthetist

## 2012-06-30 ENCOUNTER — Encounter (HOSPITAL_COMMUNITY): Payer: Self-pay

## 2012-06-30 ENCOUNTER — Encounter (HOSPITAL_COMMUNITY)
Admission: RE | Disposition: A | Discharge status: Home / Self Care | Payer: Self-pay | Source: Ambulatory Visit | Attending: Interventional Cardiology

## 2012-06-30 DIAGNOSIS — I4891 Unspecified atrial fibrillation: Secondary | ICD-10-CM | POA: Diagnosis not present

## 2012-06-30 HISTORY — PX: CARDIOVERSION: SHX1299

## 2012-06-30 LAB — PROTIME-INR: Prothrombin Time: 21.8 seconds — ABNORMAL HIGH (ref 11.6–15.2)

## 2012-06-30 SURGERY — CARDIOVERSION
Anesthesia: Monitor Anesthesia Care | Wound class: Clean

## 2012-06-30 MED ORDER — PROPOFOL 10 MG/ML IV BOLUS
INTRAVENOUS | Status: DC | PRN
  Administered 2012-06-30: 100 mg via INTRAVENOUS

## 2012-06-30 MED ORDER — LIDOCAINE HCL (CARDIAC) 20 MG/ML IV SOLN
INTRAVENOUS | Status: DC | PRN
  Administered 2012-06-30: 60 mg via INTRAVENOUS

## 2012-06-30 MED ORDER — SODIUM CHLORIDE 0.9 % IV SOLN
INTRAVENOUS | Status: DC | PRN
  Administered 2012-06-30: 12:00:00 via INTRAVENOUS

## 2012-06-30 NOTE — H&P (Signed)
  Recurrent atrial fibrillation in a patient who is on Tikosyn. His electrophysiologist, Dr. Macon Large at [facility] as recommended that we proceed with cardioversion expeditiously to prevent remodeling. The patient has had cardioversion before, he understands the procedure, and is willing to proceed.

## 2012-06-30 NOTE — Anesthesia Preprocedure Evaluation (Signed)
Anesthesia Evaluation  Patient identified by [name], date of birth, ID band Patient awake    Reviewed: Allergy & Precautions, H&P , NPO status , Patient's Chart, lab work & pertinent test results  Airway Mallampati: I  Neck ROM: Full    Dental   Pulmonary          Cardiovascular + CAD and + CABG     Neuro/Psych    GI/Hepatic GERD-  Medicated and Controlled,  Endo/Other    Renal/GU      Musculoskeletal   Abdominal   Peds  Hematology   Anesthesia Other Findings   Reproductive/Obstetrics                           Anesthesia Physical Anesthesia Plan  ASA: III  Anesthesia Plan: General   Post-op Pain Management:    Induction: Intravenous  Airway Management Planned: Mask  Additional Equipment:   Intra-op Plan:   Post-operative Plan: Extubation in OR  Informed Consent:   Plan Discussed with: CRNA and Surgeon  Anesthesia Plan Comments:         Anesthesia Quick Evaluation

## 2012-06-30 NOTE — Preoperative (Signed)
Beta Blockers   Reason not to administer Beta Blockers:Lopressor 2X/day; to check today's admin.

## 2012-06-30 NOTE — Anesthesia Postprocedure Evaluation (Signed)
  Anesthesia Post-op Note  Patient: Adam [name]  Procedure(s) Performed: Procedure(s) (LRB) with comments: CARDIOVERSION (N/A)  Patient Location: PACU  Anesthesia Type:General  Level of Consciousness: awake, alert  and patient cooperative  Airway and Oxygen Therapy: Patient Spontanous Breathing and Patient connected to nasal cannula oxygen  Post-op Pain: none  Post-op Assessment: Post-op Vital signs reviewed, Patient's Cardiovascular Status Stable, Respiratory Function Stable, Patent Airway and No signs of Nausea or vomiting  Post-op Vital Signs: Reviewed and stable  Complications: No apparent anesthesia complications

## 2012-06-30 NOTE — Transfer of Care (Signed)
Immediate Anesthesia Transfer of Care Note  Patient: Adam [name]  Procedure(s) Performed: Procedure(s) (LRB) with comments: CARDIOVERSION (N/A)  Patient Location: PACU  Anesthesia Type:General  Level of Consciousness: awake, alert  and patient cooperative  Airway & Oxygen Therapy: Patient Spontanous Breathing and Patient connected to nasal cannula oxygen  Post-op Assessment: Report given to PACU RN, Post -op Vital signs reviewed and stable and Patient moving all extremities  Post vital signs: Reviewed and stable  Complications: No apparent anesthesia complications

## 2012-06-30 NOTE — CV Procedure (Signed)
Electrical Cardioversion Procedure Note MURRAY DURRELL [id number] Jun 28, 1925  Procedure: Electrical Cardioversion Indications:  Atrial Fibrillation  Time Out: Verified patient identification, verified procedure,medications/allergies/relevent history reviewed, required imaging and test results available.  Performed  Procedure Details  The patient was NPO after midnight. Anesthesia was administered at the beside  by [facility] with 100mg  of propofol.  Cardioversion was done with synchronized biphasic defibrillation with AP pads with 150 and subsequently 200watts.  The patient converted to normal sinus rhythm/atrial pacing. The patient tolerated the procedure well   IMPRESSION:  Successful cardioversion of atrial fibrillation to AV sequential pacing    Veatrice Kells W 06/30/2012, 12:22 PM

## 2012-07-01 ENCOUNTER — Encounter (HOSPITAL_COMMUNITY): Payer: Self-pay | Admitting: Interventional Cardiology

## 2012-07-04 NOTE — H&P (Cosign Needed Addendum)
This is co-signature for this scanned H and P.

## 2012-07-13 DIAGNOSIS — L570 Actinic keratosis: Secondary | ICD-10-CM | POA: Diagnosis not present

## 2012-07-13 DIAGNOSIS — D239 Other benign neoplasm of skin, unspecified: Secondary | ICD-10-CM | POA: Diagnosis not present

## 2012-07-13 DIAGNOSIS — Z85828 Personal history of other malignant neoplasm of skin: Secondary | ICD-10-CM | POA: Diagnosis not present

## 2012-07-18 ENCOUNTER — Encounter: Payer: Medicare Other | Admitting: *Deleted

## 2012-07-18 DIAGNOSIS — I4891 Unspecified atrial fibrillation: Secondary | ICD-10-CM | POA: Diagnosis not present

## 2012-07-18 DIAGNOSIS — Z79899 Other long term (current) drug therapy: Secondary | ICD-10-CM | POA: Diagnosis not present

## 2012-07-25 ENCOUNTER — Encounter: Payer: Self-pay | Admitting: Internal Medicine

## 2012-07-25 ENCOUNTER — Ambulatory Visit (INDEPENDENT_AMBULATORY_CARE_PROVIDER_SITE_OTHER): Payer: Medicare Other | Admitting: *Deleted

## 2012-07-25 DIAGNOSIS — Z950 Presence of cardiac pacemaker: Secondary | ICD-10-CM | POA: Diagnosis not present

## 2012-07-25 DIAGNOSIS — I4891 Unspecified atrial fibrillation: Secondary | ICD-10-CM

## 2012-07-26 ENCOUNTER — Encounter: Payer: Self-pay | Admitting: *Deleted

## 2012-07-26 DIAGNOSIS — H02839 Dermatochalasis of unspecified eye, unspecified eyelid: Secondary | ICD-10-CM | POA: Insufficient documentation

## 2012-07-27 DIAGNOSIS — H43819 Vitreous degeneration, unspecified eye: Secondary | ICD-10-CM | POA: Diagnosis not present

## 2012-07-27 DIAGNOSIS — H353190 Nonexudative age-related macular degeneration, unspecified eye, stage unspecified: Secondary | ICD-10-CM | POA: Diagnosis not present

## 2012-07-27 DIAGNOSIS — Z961 Presence of intraocular lens: Secondary | ICD-10-CM | POA: Diagnosis not present

## 2012-07-27 DIAGNOSIS — H353290 Exudative age-related macular degeneration, unspecified eye, stage unspecified: Secondary | ICD-10-CM | POA: Diagnosis not present

## 2012-07-27 DIAGNOSIS — H40059 Ocular hypertension, unspecified eye: Secondary | ICD-10-CM | POA: Diagnosis not present

## 2012-07-27 DIAGNOSIS — H02839 Dermatochalasis of unspecified eye, unspecified eyelid: Secondary | ICD-10-CM | POA: Diagnosis not present

## 2012-07-27 DIAGNOSIS — H2640 Unspecified secondary cataract: Secondary | ICD-10-CM | POA: Diagnosis not present

## 2012-07-27 DIAGNOSIS — H35369 Drusen (degenerative) of macula, unspecified eye: Secondary | ICD-10-CM | POA: Diagnosis not present

## 2012-08-01 LAB — REMOTE PACEMAKER DEVICE
AL IMPEDENCE PM: 400 Ohm
ATRIAL PACING PM: 89.7
BAMS-0001: 165 {beats}/min
BATTERY VOLTAGE: 2.95 V
RV LEAD IMPEDENCE PM: 376 Ohm
VENTRICULAR PACING PM: 99.49

## 2012-08-02 DIAGNOSIS — Z7901 Long term (current) use of anticoagulants: Secondary | ICD-10-CM | POA: Diagnosis not present

## 2012-08-02 DIAGNOSIS — I4891 Unspecified atrial fibrillation: Secondary | ICD-10-CM | POA: Diagnosis not present

## 2012-08-03 ENCOUNTER — Other Ambulatory Visit: Payer: Self-pay | Admitting: Vascular Surgery

## 2012-08-03 DIAGNOSIS — I714 Abdominal aortic aneurysm, without rupture, unspecified: Secondary | ICD-10-CM | POA: Diagnosis not present

## 2012-08-04 ENCOUNTER — Encounter: Payer: Self-pay | Admitting: Vascular Surgery

## 2012-08-05 ENCOUNTER — Ambulatory Visit
Admission: RE | Admit: 2012-08-05 | Discharge: 2012-08-05 | Disposition: A | Discharge status: Home / Self Care | Payer: Medicare Other | Source: Ambulatory Visit | Attending: Vascular Surgery | Admitting: Vascular Surgery

## 2012-08-05 ENCOUNTER — Ambulatory Visit (INDEPENDENT_AMBULATORY_CARE_PROVIDER_SITE_OTHER): Payer: Medicare Other | Admitting: Vascular Surgery

## 2012-08-05 ENCOUNTER — Encounter: Payer: Self-pay | Admitting: Vascular Surgery

## 2012-08-05 VITALS — BP 144/81 | HR 91 | Ht 68.0 in | Wt 157.6 lb

## 2012-08-05 DIAGNOSIS — T82898A Other specified complication of vascular prosthetic devices, implants and grafts, initial encounter: Secondary | ICD-10-CM | POA: Diagnosis not present

## 2012-08-05 DIAGNOSIS — I714 Abdominal aortic aneurysm, without rupture, unspecified: Secondary | ICD-10-CM | POA: Diagnosis not present

## 2012-08-05 DIAGNOSIS — Z8679 Personal history of other diseases of the circulatory system: Secondary | ICD-10-CM

## 2012-08-05 DIAGNOSIS — Z48812 Encounter for surgical aftercare following surgery on the circulatory system: Secondary | ICD-10-CM

## 2012-08-05 MED ORDER — IOHEXOL 350 MG/ML SOLN
80.0000 mL | Freq: Once | INTRAVENOUS | Status: AC | PRN
  Administered 2012-08-05: 80 mL via INTRAVENOUS

## 2012-08-05 NOTE — Progress Notes (Addendum)
[facility] [place]  Established EVAR  History of Present Illness  Adam [name] is a 78 y.o. ([birth year]) male who presents for routine follow up s/p EVAR (Date: 07/28/11).  Most recent EVAR duplex (Date: 12/11/11) demonstrates: small II endoleak and unable to visualize sac size. Most recent CT (Date: 08/05/12) demonstrates: no endoleak and stable sac size.  The patient has had no back or abdominal pain.  Past Medical History, Past Surgical History, Social History, Family History, Medications, Allergies, and Review of Systems are unchanged from previous evaluation on 12/11/11.  Physical Examination  Filed Vitals:   08/05/12 1128  BP: 144/81  Pulse: 91  Height: 5\' 8"  (1.727 m)  Weight: 157 lb 9.6 oz (71.487 kg)  SpO2: 98%   Body mass index is 23.96 kg/(m^2).  General: A&O x 3, WDWN  Eyes: PERRLA, EOMI  Pulmonary: Sym exp, good air movt, CTAB, no rales, rhonchi, & wheezing  Cardiac: RRR, Nl S1, S2, no Murmurs, rubs or gallops  Vascular: Vessel Right Left  Radial Palpable Palpable  Ulnar Palpable Palpable  Brachial Palpable Palpable  Carotid Palpable, without bruit Palpable, without bruit  Aorta Not palpable N/A  Femoral Palpable Palpable  Popliteal Not palpable Not palpable  PT  Palpable  Palpable  DP  Palpable  Palpable   Gastrointestinal: soft, NTND, -G/R, - HSM, - masses, - CVAT B, no pulsatile mass  Musculoskeletal: M/S 5/5 throughout , Extremities without ischemic changes   Neurologic: CN 2-12 intact , Pain and light touch intact in extremities , Motor exam as listed above  CTA Abd/pelvis (Date: 08/05/12) 1. Patent bifurcated infrarenal aortic stent graft with a small type 2 endoleak.  2. Stable native aneurysm sac diameter.   Based on my review of the CTA, no obvious endoleak.  On the delayed images, the area of possible endoleak turns out to be calcified aortic wall.  Aortic sac size stable.  Medical Decision Making  Adam [name]  is a 78 y.o. male who presents s/p EVAR.  Pt is asymptomatic with stable sac size.   Stable thrombosed aortic sac with small type II endoleaks do NOT require any intervention.  Type II endoleaks have never been proven to be an increased rupture risk.  I discussed with the patient the importance of surveillance of the endograft.  The next endograft duplex will be scheduled for 6 months.  The next CT will be scheduled for 12 months.  The patient will follow up with [place] in 6 months with the first of these studies.  Thank you for allowing [place] to participate in this patient's care.  Leonides Sake, MD [facility] [place] Office: [phone] Pager: [phone]  08/05/2012, 11:40 AM

## 2012-08-08 NOTE — Addendum Note (Signed)
Addended by: Sharee Pimple on: 08/08/2012 02:22 PM   Modules accepted: Orders

## 2012-08-15 DIAGNOSIS — I25810 Atherosclerosis of coronary artery bypass graft(s) without angina pectoris: Secondary | ICD-10-CM | POA: Diagnosis not present

## 2012-08-15 DIAGNOSIS — I10 Essential (primary) hypertension: Secondary | ICD-10-CM | POA: Diagnosis not present

## 2012-08-15 DIAGNOSIS — I4891 Unspecified atrial fibrillation: Secondary | ICD-10-CM | POA: Diagnosis not present

## 2012-08-18 ENCOUNTER — Encounter: Payer: Self-pay | Admitting: *Deleted

## 2012-08-31 DIAGNOSIS — I10 Essential (primary) hypertension: Secondary | ICD-10-CM | POA: Diagnosis not present

## 2012-08-31 DIAGNOSIS — Z0000 Encounter for general adult medical examination without abnormal findings: Secondary | ICD-10-CM | POA: Diagnosis not present

## 2012-08-31 DIAGNOSIS — Z79899 Other long term (current) drug therapy: Secondary | ICD-10-CM | POA: Diagnosis not present

## 2012-09-06 DIAGNOSIS — Z7901 Long term (current) use of anticoagulants: Secondary | ICD-10-CM | POA: Diagnosis not present

## 2012-09-06 DIAGNOSIS — I4891 Unspecified atrial fibrillation: Secondary | ICD-10-CM | POA: Diagnosis not present

## 2012-09-20 DIAGNOSIS — I4891 Unspecified atrial fibrillation: Secondary | ICD-10-CM | POA: Diagnosis not present

## 2012-09-20 DIAGNOSIS — Z7901 Long term (current) use of anticoagulants: Secondary | ICD-10-CM | POA: Diagnosis not present

## 2012-10-05 DIAGNOSIS — H43819 Vitreous degeneration, unspecified eye: Secondary | ICD-10-CM | POA: Diagnosis not present

## 2012-10-05 DIAGNOSIS — H02839 Dermatochalasis of unspecified eye, unspecified eyelid: Secondary | ICD-10-CM | POA: Diagnosis not present

## 2012-10-05 DIAGNOSIS — H35369 Drusen (degenerative) of macula, unspecified eye: Secondary | ICD-10-CM | POA: Diagnosis not present

## 2012-10-05 DIAGNOSIS — H353290 Exudative age-related macular degeneration, unspecified eye, stage unspecified: Secondary | ICD-10-CM | POA: Diagnosis not present

## 2012-10-05 DIAGNOSIS — H353190 Nonexudative age-related macular degeneration, unspecified eye, stage unspecified: Secondary | ICD-10-CM | POA: Diagnosis not present

## 2012-10-05 DIAGNOSIS — H40059 Ocular hypertension, unspecified eye: Secondary | ICD-10-CM | POA: Diagnosis not present

## 2012-10-05 DIAGNOSIS — H409 Unspecified glaucoma: Secondary | ICD-10-CM | POA: Diagnosis not present

## 2012-10-05 DIAGNOSIS — H2640 Unspecified secondary cataract: Secondary | ICD-10-CM | POA: Diagnosis not present

## 2012-10-05 DIAGNOSIS — Z961 Presence of intraocular lens: Secondary | ICD-10-CM | POA: Diagnosis not present

## 2012-10-05 DIAGNOSIS — H4011X0 Primary open-angle glaucoma, stage unspecified: Secondary | ICD-10-CM | POA: Diagnosis not present

## 2012-10-07 ENCOUNTER — Encounter: Payer: Self-pay | Admitting: Internal Medicine

## 2012-10-07 ENCOUNTER — Ambulatory Visit (INDEPENDENT_AMBULATORY_CARE_PROVIDER_SITE_OTHER): Payer: Medicare Other | Admitting: Internal Medicine

## 2012-10-07 VITALS — BP 107/68 | HR 83 | Ht 68.0 in | Wt 158.8 lb

## 2012-10-07 DIAGNOSIS — Z950 Presence of cardiac pacemaker: Secondary | ICD-10-CM | POA: Diagnosis not present

## 2012-10-07 DIAGNOSIS — I4891 Unspecified atrial fibrillation: Secondary | ICD-10-CM

## 2012-10-07 DIAGNOSIS — R001 Bradycardia, unspecified: Secondary | ICD-10-CM | POA: Insufficient documentation

## 2012-10-07 DIAGNOSIS — I498 Other specified cardiac arrhythmias: Secondary | ICD-10-CM | POA: Diagnosis not present

## 2012-10-07 LAB — PACEMAKER DEVICE OBSERVATION
AL THRESHOLD: 0.5 V
ATRIAL PACING PM: 90.21
BAMS-0001: 165 {beats}/min
RV LEAD IMPEDENCE PM: 408 Ohm
RV LEAD THRESHOLD: 1 V
VENTRICULAR PACING PM: 99.58

## 2012-10-07 NOTE — Assessment & Plan Note (Signed)
Currently holding sinus rhythm on  Dofetilide. I will defer to Dr. Allie Dimmer and Katrinka Blazing assessment of electrocardiogram potassium and magnesium as yet seen him regularly.

## 2012-10-07 NOTE — Progress Notes (Signed)
Patient Care Team: Darnelle Bos, MD as PCP - General (Internal Medicine) Corky Crafts, MD (Cardiology) Lesleigh Noe, MD (Cardiology)   HPI  Adam [name] is a 79 y.o. male Seen to establish his pacemaker followup.  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    He is at [facility] on dofetilide. His cardioverted. In the fall he reverted to atrial fibrillation was cardioverted again in early November and has mostly held sinus rhythm since then. He notes improvement in exercise tolerance  Past Medical History  Diagnosis Date  . S/P CABG (coronary artery bypass graft)   . Hyperlipidemia   . GERD (gastroesophageal reflux disease)   . Presence of permanent cardiac pacemaker   . Chronic anticoagulation   . LBP (low back pain)   . AAA (abdominal aortic aneurysm)   . Diverticulosis   . Arthritis   . 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 PAF (paroxysmal atrial fibrillation)     Cardioversion June 2011 & IN 2012    Past Surgical History  Procedure Laterality Date  . Coronary artery bypass graft  Feb 1989  . Permanent pacemaker  July 2008  . Tonsillectomy    . Cataract extraction    . Joint replacement  1995 & 1996    Bilateral total knee replacements  . Endovascular stent insertion  07/28/2011    Procedure: ENDOVASCULAR STENT GRAFT INSERTION;  Surgeon: Nilda Simmer, MD;  Location: [facility] OR;  Service: Vascular;  Laterality: N/A;  Pt. on Coumadin/ instructed to hold 5 days prior to procedure  . Cardioversion  06/30/2012    Procedure: CARDIOVERSION;  Surgeon: Lesleigh Noe, MD;  Location: [facility] ENDOSCOPY;  Service: Cardiovascular;  Laterality: N/A;    Current Outpatient Prescriptions  Medication Sig Dispense Refill  . acetaminophen (TYLENOL) 650 MG CR tablet Take 650 mg by  mouth 2 (two) times daily.       [name] atorvastatin (LIPITOR) 10 MG tablet Take 10 mg by mouth daily.        [name] dofetilide (TIKOSYN) 125 MCG capsule Take 125 mcg by mouth 2 (two) times daily.      . furosemide (LASIX) 20 MG tablet Take two tablet by mouth once daily.      . Glucosamine-Chondroit-Vit C-Mn (GLUCOSAMINE 1500 COMPLEX) CAPS Take 1 capsule by mouth daily.       . metoprolol tartrate (LOPRESSOR) 25 MG tablet Take 1 tablet (25 mg total) by mouth 2 (two) times daily.  180 tablet  1  . metoprolol tartrate (LOPRESSOR) 25 MG tablet TAKE 1 TABLET BY MOUTH TWICE DAILY WITH FOOD  180 tablet  0  . nitroGLYCERIN (NITROSTAT) 0.4 MG SL tablet Place 0.4 mg under the tongue every 5 (five) minutes as needed. For chest pain      . Omega-3 Fatty Acids (FISH OIL) 1000 MG CPDR Take 1 capsule by mouth daily.       [name] omeprazole (PRILOSEC) 20 MG capsule Take 20 mg by mouth daily.        . ranitidine (ZANTAC) 150 MG tablet       . ranitidine (ZANTAC) 300 MG tablet Take 300 mg by mouth at bedtime.      . silodosin (RAPAFLO) 4 MG CAPS capsule Take 4 mg by mouth  at bedtime.        [name] VITAMIN D, CHOLECALCIFEROL, PO Take 2,000 Int'l Units by mouth daily.        [name] warfarin (COUMADIN) 5 MG tablet Take 5 mg by mouth daily. MWF=1 tablet  All other days = 1/2 tablet   Last dose taken 07/22/11 per patient       No current facility-administered medications for this visit.    Allergies  Allergen Reactions  . Morphine Nausea Only    Review of Systems negative except from HPI and PMH  Physical Exam BP 107/68  Pulse 83  Ht 5\' 8"  (1.727 m)  Wt 158 lb 12.8 oz (72.031 kg)  BMI 24.15 kg/m2  Well developed and well nourished in no acute distress HENT normal E scleral and icterus clear Neck Supple JVP flat; carotids brisk and full Clear to ausculation Device pocket well healed; without hematoma or erythema Regular rate and rhythm, no murmurs gallops or rub Soft with active bowel sounds No clubbing cyanosis none  Edema Alert and oriented, grossly normal motor and sensory function Skin Warm and Dry  No ECG  Assessment and  Plan

## 2012-10-07 NOTE — Patient Instructions (Signed)

## 2012-10-07 NOTE — Assessment & Plan Note (Signed)
He is to 90% atrially paced. He also paces in the ventricle primarily because he is poor conduction at higher rates.

## 2012-10-07 NOTE — Assessment & Plan Note (Signed)
The patient's device was interrogated.  The information was reviewed. No changes were made in the programming.    

## 2012-10-18 DIAGNOSIS — I4891 Unspecified atrial fibrillation: Secondary | ICD-10-CM | POA: Diagnosis not present

## 2012-10-18 DIAGNOSIS — Z7901 Long term (current) use of anticoagulants: Secondary | ICD-10-CM | POA: Diagnosis not present

## 2012-11-01 DIAGNOSIS — I4891 Unspecified atrial fibrillation: Secondary | ICD-10-CM | POA: Diagnosis not present

## 2012-11-01 DIAGNOSIS — Z7901 Long term (current) use of anticoagulants: Secondary | ICD-10-CM | POA: Diagnosis not present

## 2012-11-09 DIAGNOSIS — I25810 Atherosclerosis of coronary artery bypass graft(s) without angina pectoris: Secondary | ICD-10-CM | POA: Diagnosis not present

## 2012-11-09 DIAGNOSIS — I4891 Unspecified atrial fibrillation: Secondary | ICD-10-CM | POA: Diagnosis not present

## 2012-11-09 DIAGNOSIS — I10 Essential (primary) hypertension: Secondary | ICD-10-CM | POA: Diagnosis not present

## 2012-11-09 DIAGNOSIS — Z79899 Other long term (current) drug therapy: Secondary | ICD-10-CM | POA: Diagnosis not present

## 2012-11-22 DIAGNOSIS — H40059 Ocular hypertension, unspecified eye: Secondary | ICD-10-CM | POA: Diagnosis not present

## 2012-11-22 DIAGNOSIS — H2640 Unspecified secondary cataract: Secondary | ICD-10-CM | POA: Diagnosis not present

## 2012-11-22 DIAGNOSIS — H353290 Exudative age-related macular degeneration, unspecified eye, stage unspecified: Secondary | ICD-10-CM | POA: Diagnosis not present

## 2012-11-22 DIAGNOSIS — Z961 Presence of intraocular lens: Secondary | ICD-10-CM | POA: Diagnosis not present

## 2012-11-22 DIAGNOSIS — H43819 Vitreous degeneration, unspecified eye: Secondary | ICD-10-CM | POA: Diagnosis not present

## 2012-11-22 DIAGNOSIS — H353190 Nonexudative age-related macular degeneration, unspecified eye, stage unspecified: Secondary | ICD-10-CM | POA: Diagnosis not present

## 2012-11-29 DIAGNOSIS — Z7901 Long term (current) use of anticoagulants: Secondary | ICD-10-CM | POA: Diagnosis not present

## 2012-11-29 DIAGNOSIS — I4891 Unspecified atrial fibrillation: Secondary | ICD-10-CM | POA: Diagnosis not present

## 2012-11-30 DIAGNOSIS — I10 Essential (primary) hypertension: Secondary | ICD-10-CM | POA: Diagnosis not present

## 2012-12-05 DIAGNOSIS — I4891 Unspecified atrial fibrillation: Secondary | ICD-10-CM | POA: Diagnosis not present

## 2012-12-05 DIAGNOSIS — E875 Hyperkalemia: Secondary | ICD-10-CM | POA: Diagnosis not present

## 2012-12-05 DIAGNOSIS — I10 Essential (primary) hypertension: Secondary | ICD-10-CM | POA: Diagnosis not present

## 2012-12-11 ENCOUNTER — Other Ambulatory Visit: Payer: Self-pay | Admitting: Nurse Practitioner

## 2012-12-12 ENCOUNTER — Other Ambulatory Visit: Payer: Self-pay | Admitting: Nurse Practitioner

## 2013-01-02 ENCOUNTER — Ambulatory Visit (INDEPENDENT_AMBULATORY_CARE_PROVIDER_SITE_OTHER): Payer: Medicare Other | Admitting: *Deleted

## 2013-01-02 DIAGNOSIS — Z950 Presence of cardiac pacemaker: Secondary | ICD-10-CM

## 2013-01-02 DIAGNOSIS — I4891 Unspecified atrial fibrillation: Secondary | ICD-10-CM | POA: Diagnosis not present

## 2013-01-02 DIAGNOSIS — R001 Bradycardia, unspecified: Secondary | ICD-10-CM

## 2013-01-02 DIAGNOSIS — I498 Other specified cardiac arrhythmias: Secondary | ICD-10-CM

## 2013-01-02 DIAGNOSIS — Z7901 Long term (current) use of anticoagulants: Secondary | ICD-10-CM | POA: Diagnosis not present

## 2013-01-10 LAB — REMOTE PACEMAKER DEVICE
AL AMPLITUDE: 3.8 mv
AL IMPEDENCE PM: 400 Ohm
ATRIAL PACING PM: 90.08
BAMS-0001: 165 {beats}/min
BATTERY VOLTAGE: 2.93 V
BRDY-0002RA: 70 {beats}/min
BRDY-0003RA: 120 {beats}/min
BRDY-0004RA: 120 {beats}/min
RV LEAD IMPEDENCE PM: 368 Ohm
VENTRICULAR PACING PM: 99.88

## 2013-01-13 ENCOUNTER — Encounter: Payer: Self-pay | Admitting: *Deleted

## 2013-01-16 ENCOUNTER — Encounter: Payer: Self-pay | Admitting: *Deleted

## 2013-01-19 ENCOUNTER — Encounter: Payer: Self-pay | Admitting: Internal Medicine

## 2013-02-01 DIAGNOSIS — Z7901 Long term (current) use of anticoagulants: Secondary | ICD-10-CM | POA: Diagnosis not present

## 2013-02-01 DIAGNOSIS — I4891 Unspecified atrial fibrillation: Secondary | ICD-10-CM | POA: Diagnosis not present

## 2013-02-02 ENCOUNTER — Encounter: Payer: Self-pay | Admitting: Vascular Surgery

## 2013-02-03 ENCOUNTER — Other Ambulatory Visit: Payer: Self-pay | Admitting: *Deleted

## 2013-02-03 ENCOUNTER — Encounter: Payer: Self-pay | Admitting: Vascular Surgery

## 2013-02-03 ENCOUNTER — Encounter (INDEPENDENT_AMBULATORY_CARE_PROVIDER_SITE_OTHER): Payer: Medicare Other | Admitting: *Deleted

## 2013-02-03 ENCOUNTER — Ambulatory Visit (INDEPENDENT_AMBULATORY_CARE_PROVIDER_SITE_OTHER): Payer: Medicare Other | Admitting: Vascular Surgery

## 2013-02-03 VITALS — BP 133/74 | HR 75 | Ht 68.0 in | Wt 162.8 lb

## 2013-02-03 DIAGNOSIS — I714 Abdominal aortic aneurysm, without rupture, unspecified: Secondary | ICD-10-CM | POA: Diagnosis not present

## 2013-02-03 DIAGNOSIS — Z48812 Encounter for surgical aftercare following surgery on the circulatory system: Secondary | ICD-10-CM | POA: Diagnosis not present

## 2013-02-03 NOTE — Progress Notes (Signed)
[facility] [place]  Established EVAR  History of Present Illness  Adam [name] is a 79 y.o. (09/10/1924) male who presents for routine follow up s/p EVAR (Date: 07/28/11).  Most recent EVAR duplex (Date: 02/03/13) demonstrates: no endoleak and decreaing sac size. Most recent CT (Date: 08/05/12) demonstrates: no endoleak and stable sac size.  The patient has had no back or abdominal pain.  Past Medical History, Past Surgical History, Social History, Family History, Medications, Allergies, and Review of Systems are unchanged from previous evaluation on 08/05/12. On ROS: no back or abdominal pain.  No claudication or rest pain  Physical Examination  Filed Vitals:   02/03/13 1007  BP: 133/74  Pulse: 75  Height: 5\' 8"  (1.727 m)  Weight: 162 lb 12.8 oz (73.846 kg)  SpO2: 100%   Body mass index is 24.76 kg/(m^2).  General: A&O x 3, WDWN  Eyes: PERRLA, EOMI  Pulmonary: Sym exp, good air movt, CTAB, no rales, rhonchi, & wheezing  Cardiac: RRR, Nl S1, S2, no Murmurs, rubs or gallops  Vascular: Vessel Right Left  Radial Palpable Palpable  Ulnar Palpable Palpable  Brachial Palpable Palpable  Carotid Palpable, without bruit Palpable, without bruit  Aorta Not palpable N/A  Femoral Palpable Palpable  Popliteal Not palpable Not palpable  PT  Palpable  Palpable  DP  Palpable  Palpable   Gastrointestinal: soft, NTND, -G/R, - HSM, - masses, - CVAT B  Musculoskeletal: M/S 5/5 throughout , Extremities without ischemic changes   Neurologic: Pain and light touch intact in extremities , Motor exam as listed above  Non-Invasive Vascular Imaging  EVAR Duplex (Date: 02/03/13)  AAA sac size: 4.39 cm x 4.34 cam  No endoleak detected  Medical Decision Making  [name] is a 79 y.o. male who presents s/p EVAR.  Pt is asymptomatic with decreasing sac size.  I discussed with the patient the importance of surveillance of the endograft.  The next endograft  duplex will be scheduled for 12 months.  The next CTA will be scheduled for 6 months.  At that point, two years of surveillance will have passed.  The next CTA after that would be at the 5 year mark.  The patient will follow up with [place] in 6 months with these studies.  Thank you for allowing [place] to participate in this patient's care.  Leonides Sake, MD [facility] [place] Office: [phone] Pager: [phone]  02/03/2013, 10:25 AM

## 2013-03-01 DIAGNOSIS — Z7901 Long term (current) use of anticoagulants: Secondary | ICD-10-CM | POA: Diagnosis not present

## 2013-03-01 DIAGNOSIS — I4891 Unspecified atrial fibrillation: Secondary | ICD-10-CM | POA: Diagnosis not present

## 2013-03-29 DIAGNOSIS — I4891 Unspecified atrial fibrillation: Secondary | ICD-10-CM | POA: Diagnosis not present

## 2013-03-29 DIAGNOSIS — Z7901 Long term (current) use of anticoagulants: Secondary | ICD-10-CM | POA: Diagnosis not present

## 2013-04-10 ENCOUNTER — Ambulatory Visit (INDEPENDENT_AMBULATORY_CARE_PROVIDER_SITE_OTHER): Payer: Medicare Other | Admitting: *Deleted

## 2013-04-10 DIAGNOSIS — IMO0002 Reserved for concepts with insufficient information to code with codable children: Secondary | ICD-10-CM | POA: Diagnosis not present

## 2013-04-10 DIAGNOSIS — Z950 Presence of cardiac pacemaker: Secondary | ICD-10-CM | POA: Diagnosis not present

## 2013-04-10 DIAGNOSIS — I498 Other specified cardiac arrhythmias: Secondary | ICD-10-CM

## 2013-04-10 DIAGNOSIS — I4891 Unspecified atrial fibrillation: Secondary | ICD-10-CM

## 2013-04-10 DIAGNOSIS — R001 Bradycardia, unspecified: Secondary | ICD-10-CM

## 2013-04-10 DIAGNOSIS — N320 Bladder-neck obstruction: Secondary | ICD-10-CM | POA: Diagnosis not present

## 2013-04-10 DIAGNOSIS — N400 Enlarged prostate without lower urinary tract symptoms: Secondary | ICD-10-CM | POA: Diagnosis not present

## 2013-04-14 DIAGNOSIS — D692 Other nonthrombocytopenic purpura: Secondary | ICD-10-CM | POA: Diagnosis not present

## 2013-04-14 DIAGNOSIS — D485 Neoplasm of uncertain behavior of skin: Secondary | ICD-10-CM | POA: Diagnosis not present

## 2013-04-14 DIAGNOSIS — D1801 Hemangioma of skin and subcutaneous tissue: Secondary | ICD-10-CM | POA: Diagnosis not present

## 2013-04-14 DIAGNOSIS — L570 Actinic keratosis: Secondary | ICD-10-CM | POA: Diagnosis not present

## 2013-04-14 DIAGNOSIS — L821 Other seborrheic keratosis: Secondary | ICD-10-CM | POA: Diagnosis not present

## 2013-04-14 DIAGNOSIS — D239 Other benign neoplasm of skin, unspecified: Secondary | ICD-10-CM | POA: Diagnosis not present

## 2013-04-14 DIAGNOSIS — Z85828 Personal history of other malignant neoplasm of skin: Secondary | ICD-10-CM | POA: Diagnosis not present

## 2013-04-14 DIAGNOSIS — L819 Disorder of pigmentation, unspecified: Secondary | ICD-10-CM | POA: Diagnosis not present

## 2013-04-21 LAB — REMOTE PACEMAKER DEVICE
AL AMPLITUDE: 3.4 mv
AL IMPEDENCE PM: 384 Ohm
BAMS-0001: 165 {beats}/min
BATTERY VOLTAGE: 2.92 V

## 2013-04-26 DIAGNOSIS — I4891 Unspecified atrial fibrillation: Secondary | ICD-10-CM | POA: Diagnosis not present

## 2013-04-26 DIAGNOSIS — Z7901 Long term (current) use of anticoagulants: Secondary | ICD-10-CM | POA: Diagnosis not present

## 2013-04-28 NOTE — Progress Notes (Signed)
ppm remote. All functions normal. Full details in [facility].  [facility] 07/17/13 & ROV w/ DR. Graciela Husbands 09/2013

## 2013-05-09 ENCOUNTER — Encounter: Payer: Self-pay | Admitting: *Deleted

## 2013-05-16 ENCOUNTER — Other Ambulatory Visit: Payer: Self-pay | Admitting: Interventional Cardiology

## 2013-05-16 DIAGNOSIS — Z7901 Long term (current) use of anticoagulants: Secondary | ICD-10-CM | POA: Diagnosis not present

## 2013-05-16 DIAGNOSIS — Z950 Presence of cardiac pacemaker: Secondary | ICD-10-CM | POA: Diagnosis not present

## 2013-05-16 DIAGNOSIS — I10 Essential (primary) hypertension: Secondary | ICD-10-CM | POA: Diagnosis not present

## 2013-05-16 DIAGNOSIS — Z79899 Other long term (current) drug therapy: Secondary | ICD-10-CM | POA: Diagnosis not present

## 2013-05-16 DIAGNOSIS — E875 Hyperkalemia: Secondary | ICD-10-CM

## 2013-05-16 DIAGNOSIS — I25810 Atherosclerosis of coronary artery bypass graft(s) without angina pectoris: Secondary | ICD-10-CM | POA: Diagnosis not present

## 2013-05-16 DIAGNOSIS — I4891 Unspecified atrial fibrillation: Secondary | ICD-10-CM | POA: Diagnosis not present

## 2013-05-17 ENCOUNTER — Telehealth: Payer: Self-pay

## 2013-05-17 ENCOUNTER — Other Ambulatory Visit (INDEPENDENT_AMBULATORY_CARE_PROVIDER_SITE_OTHER): Payer: Medicare Other

## 2013-05-17 DIAGNOSIS — E875 Hyperkalemia: Secondary | ICD-10-CM

## 2013-05-17 LAB — POTASSIUM: Potassium: 4.2 mEq/L (ref 3.5–5.1)

## 2013-05-17 NOTE — Telephone Encounter (Signed)
Message copied by Jarvis Newcomer on Wed May 17, 2013  4:51 PM ------      Message from: Verdis Prime      Created: Wed May 17, 2013  2:07 PM       The potassium level is normal. No change in current therapy. ------

## 2013-05-17 NOTE — Telephone Encounter (Signed)
Pt notified of lab. potassium level is normal. No change in current therapy.pt verbalized understanding

## 2013-05-18 ENCOUNTER — Encounter: Payer: Self-pay | Admitting: Internal Medicine

## 2013-05-22 ENCOUNTER — Telehealth: Payer: Self-pay | Admitting: Interventional Cardiology

## 2013-05-22 NOTE — Telephone Encounter (Signed)
New problem    Was told to call back after visit at [facility] .  Dr. Barrett Shell said was fine for Dr. Katrinka Blazing to take over care of patient cardiology and prescribe Tikoysn.

## 2013-05-22 NOTE — Telephone Encounter (Signed)
lmom advised Dr.Smith was aware he would be taking over his tykosin care.and to call us with any questions

## 2013-05-26 MED ORDER — DOFETILIDE 125 MCG PO CAPS: 125.0000 ug | ORAL_CAPSULE | Freq: Two times a day (BID) | ORAL | Status: DC

## 2013-05-26 NOTE — Telephone Encounter (Signed)
Follow Up:  Pt's wife states she received Lisa's message and she is happy to know the doctor will see her husband every 4 months for a blood check. Also, Pt's wfie states [facility] said it would be fine for Dr. Katrinka Blazing to prescribe Tikosyn for for her husband. Please send Rx to [facility] at [facility]. Pt's wife states they will need a refill in two weeks.

## 2013-05-26 NOTE — Telephone Encounter (Signed)
Refill for tikosyn done

## 2013-05-27 DIAGNOSIS — Z23 Encounter for immunization: Secondary | ICD-10-CM | POA: Diagnosis not present

## 2013-05-31 ENCOUNTER — Ambulatory Visit (INDEPENDENT_AMBULATORY_CARE_PROVIDER_SITE_OTHER): Payer: Medicare Other | Admitting: Pharmacist

## 2013-05-31 DIAGNOSIS — I4891 Unspecified atrial fibrillation: Secondary | ICD-10-CM

## 2013-05-31 LAB — POCT INR: INR: 2.2

## 2013-06-02 ENCOUNTER — Telehealth: Payer: Self-pay | Admitting: Interventional Cardiology

## 2013-06-02 NOTE — Telephone Encounter (Signed)
New message    Wife says husband is in afib and in the past he was told to always call Dr Katrinka Blazing immediately

## 2013-06-02 NOTE — Telephone Encounter (Signed)
Patient's wife called in to report that patient feels he is back in AFib. He had started Tikosyn around 8 mths ago and he has "done well with it". States that patient has mild SOB without dysnpea, occasional mild dizziness and "somewhat" increased fatigue. HR 60 "as best as wife can tell feeling his pulse". States they want to know if he needs to come in or get EKG.  Dr. Katrinka Blazing advised that patient is on correct treatment and patient should "take it easy" and take his Tikosyn, as prescribed. Patient advised that should he feel CP, "feeling smothery"/dyspnea, worsening symptoms, then he should call office back or proceed to ED. Patient's wife verbalized understanding and agreement. She stated she will call back if things change or he feels worse and if he experiences dyspnea/CP.

## 2013-06-05 ENCOUNTER — Telehealth: Payer: Self-pay | Admitting: Interventional Cardiology

## 2013-06-05 NOTE — Telephone Encounter (Signed)
Patients wife called in and everything is ok with his A-fib. Any questions you can call her back if you need too.  She just wanted to let you know.

## 2013-06-11 ENCOUNTER — Other Ambulatory Visit: Payer: Self-pay | Admitting: Nurse Practitioner

## 2013-06-28 DIAGNOSIS — H2640 Unspecified secondary cataract: Secondary | ICD-10-CM | POA: Diagnosis not present

## 2013-06-28 DIAGNOSIS — Z961 Presence of intraocular lens: Secondary | ICD-10-CM | POA: Diagnosis not present

## 2013-06-28 DIAGNOSIS — H02839 Dermatochalasis of unspecified eye, unspecified eyelid: Secondary | ICD-10-CM | POA: Diagnosis not present

## 2013-06-28 DIAGNOSIS — H40059 Ocular hypertension, unspecified eye: Secondary | ICD-10-CM | POA: Diagnosis not present

## 2013-06-28 DIAGNOSIS — H5210 Myopia, unspecified eye: Secondary | ICD-10-CM | POA: Diagnosis not present

## 2013-06-28 DIAGNOSIS — H43819 Vitreous degeneration, unspecified eye: Secondary | ICD-10-CM | POA: Diagnosis not present

## 2013-06-28 DIAGNOSIS — H353190 Nonexudative age-related macular degeneration, unspecified eye, stage unspecified: Secondary | ICD-10-CM | POA: Diagnosis not present

## 2013-06-28 DIAGNOSIS — H524 Presbyopia: Secondary | ICD-10-CM | POA: Diagnosis not present

## 2013-06-28 DIAGNOSIS — H409 Unspecified glaucoma: Secondary | ICD-10-CM | POA: Diagnosis not present

## 2013-06-28 DIAGNOSIS — H4011X0 Primary open-angle glaucoma, stage unspecified: Secondary | ICD-10-CM | POA: Diagnosis not present

## 2013-06-28 DIAGNOSIS — H52209 Unspecified astigmatism, unspecified eye: Secondary | ICD-10-CM | POA: Diagnosis not present

## 2013-07-07 ENCOUNTER — Other Ambulatory Visit: Payer: Self-pay

## 2013-07-07 MED ORDER — DOFETILIDE 125 MCG PO CAPS: 125.0000 ug | ORAL_CAPSULE | Freq: Two times a day (BID) | ORAL | Status: DC

## 2013-07-08 ENCOUNTER — Other Ambulatory Visit: Payer: Self-pay | Admitting: Interventional Cardiology

## 2013-07-11 ENCOUNTER — Telehealth: Payer: Self-pay | Admitting: Interventional Cardiology

## 2013-07-11 ENCOUNTER — Ambulatory Visit (INDEPENDENT_AMBULATORY_CARE_PROVIDER_SITE_OTHER): Payer: Medicare Other | Admitting: Pharmacist

## 2013-07-11 DIAGNOSIS — I4891 Unspecified atrial fibrillation: Secondary | ICD-10-CM | POA: Diagnosis not present

## 2013-07-11 LAB — POCT INR: INR: 2.8

## 2013-07-11 NOTE — Telephone Encounter (Signed)
New message    Refill tikosyn at [facility]---pt said Dr Adam [name] agreed to refill medication although [facility] wrote initial presc

## 2013-07-11 NOTE — Telephone Encounter (Signed)
lmom.refill sent to [facility] 07/07/13. attempted to call pharmacy but line busy.adv pt to f/u with pt and call back if he needs additional assistance.

## 2013-07-17 ENCOUNTER — Ambulatory Visit (INDEPENDENT_AMBULATORY_CARE_PROVIDER_SITE_OTHER): Payer: Medicare Other | Admitting: *Deleted

## 2013-07-17 DIAGNOSIS — I4891 Unspecified atrial fibrillation: Secondary | ICD-10-CM | POA: Diagnosis not present

## 2013-07-17 DIAGNOSIS — R001 Bradycardia, unspecified: Secondary | ICD-10-CM

## 2013-07-17 DIAGNOSIS — I498 Other specified cardiac arrhythmias: Secondary | ICD-10-CM

## 2013-07-26 ENCOUNTER — Encounter: Payer: Self-pay | Admitting: Cardiology

## 2013-07-31 DIAGNOSIS — J310 Chronic rhinitis: Secondary | ICD-10-CM | POA: Diagnosis not present

## 2013-07-31 LAB — MDC_IDC_ENUM_SESS_TYPE_REMOTE
Brady Statistic AP VP Percent: 89.9 %
Brady Statistic AS VP Percent: 9.7 %
Lead Channel Sensing Intrinsic Amplitude: 3.3 mV
Lead Channel Setting Pacing Amplitude: 2 V
Lead Channel Setting Pacing Amplitude: 2.5 V
Lead Channel Setting Pacing Pulse Width: 0.4 ms
Lead Channel Setting Sensing Sensitivity: 0.9 mV

## 2013-08-11 IMAGING — CT CT CTA ABD/PEL W/CM AND/OR W/O CM
3 of 10 series · 12 of 36 positions shown, 17 images · IV contrast ([ID] [id number])
Comparison: 05/29/2011

CLINICAL DATA: Aortic aneurysm status post repair

CT ANGIOGRAPHY ABDOMEN AND PELVIS
TECHNIQUE: Multidetector CT imaging of the abdomen and pelvis was
performed using the standard protocol during bolus administration
of intravenous contrast.  Multiplanar reconstructed images
including MIPs were obtained and reviewed to evaluate the vascular
anatomy.
Contrast: 100mL OMNIPAQUE IOHEXOL 350 MG/ML IV SOLN

[Series 5: angio · axial · 0.70mm/px · z∈[-314,-39]mm · 6 of 210 slices shown, 11 images]
[im 30/210  soft-tissue]
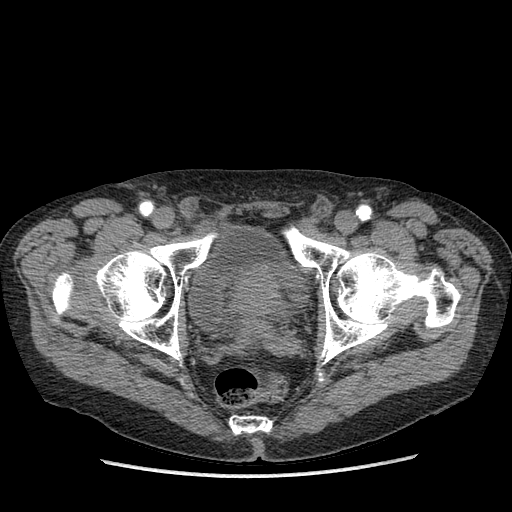
[im 30/210  bone]
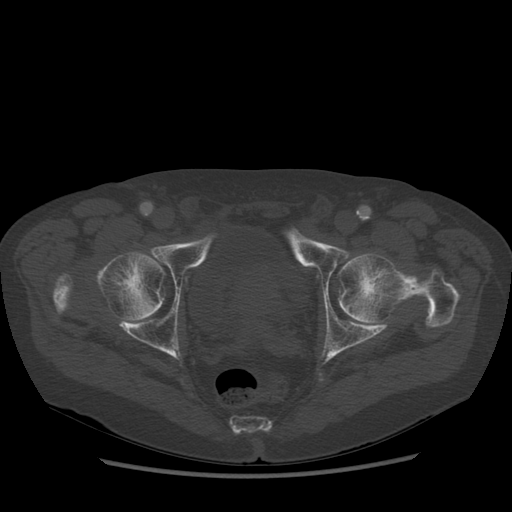
[im 60/210  soft-tissue]
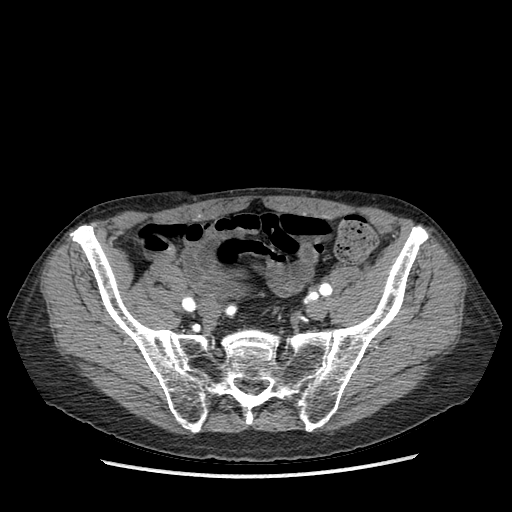
[im 90/210  soft-tissue]
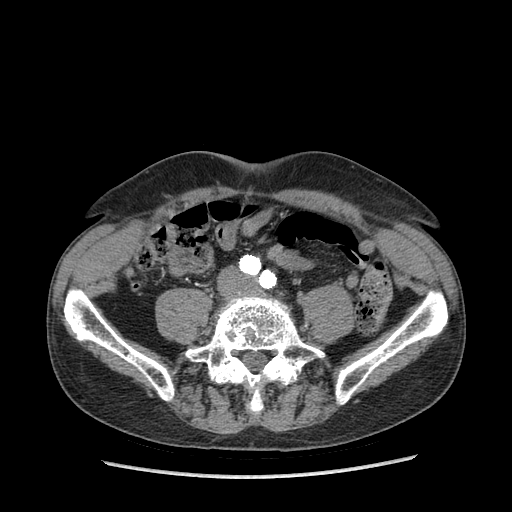
[im 90/210  lung]
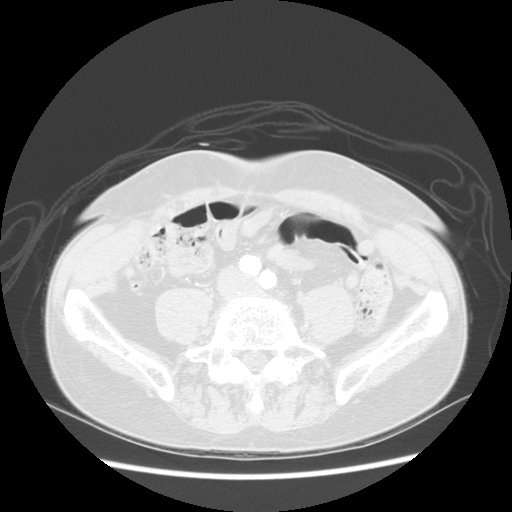
[im 120/210  soft-tissue]
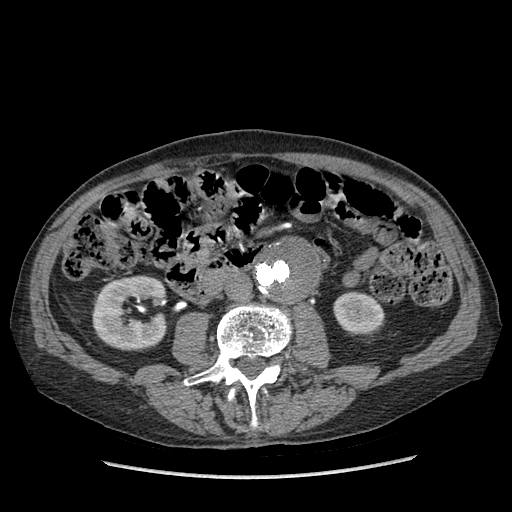
[im 120/210  lung]
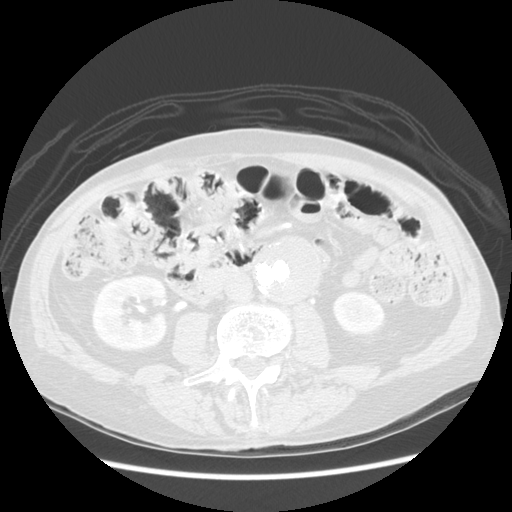
[im 150/210  soft-tissue]
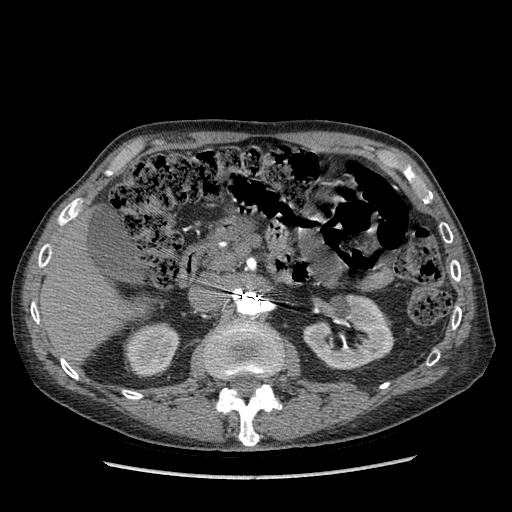
[im 150/210  lung]
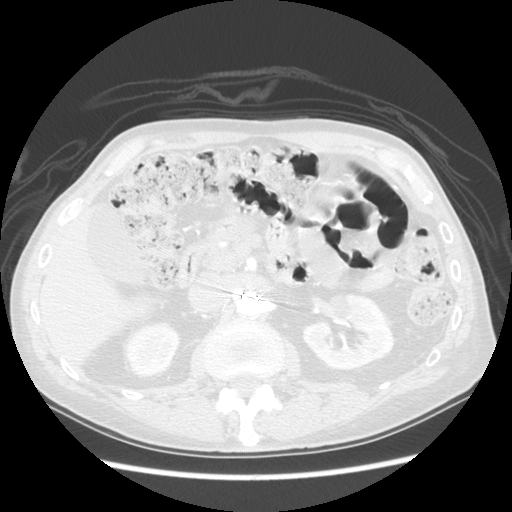
[im 180/210  soft-tissue]
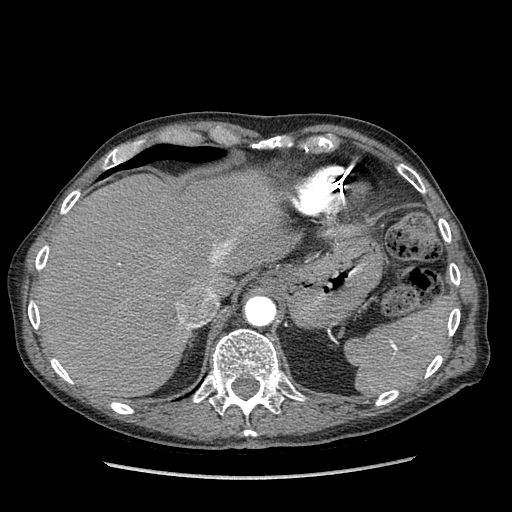
[im 180/210  lung]
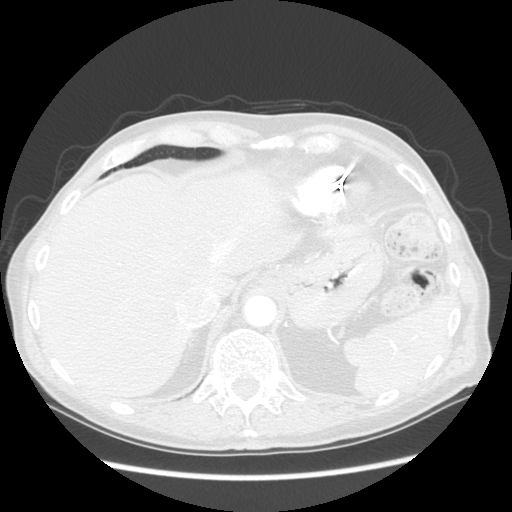

[Series 602: sagittal body · sagittal · 0.83mm/px · 3 of 145 slices shown (1 of 2)]
[im 37/145  soft-tissue]
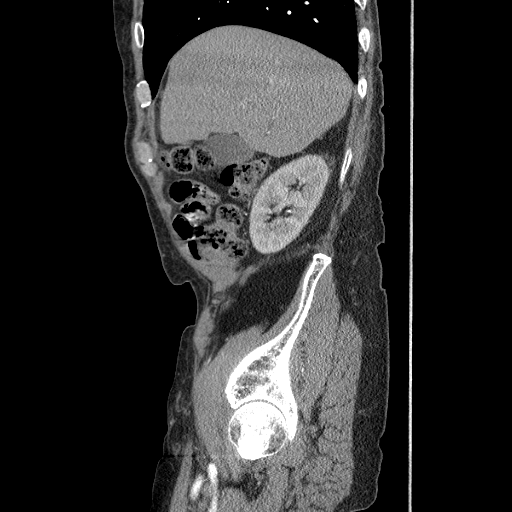
[im 73/145  soft-tissue]
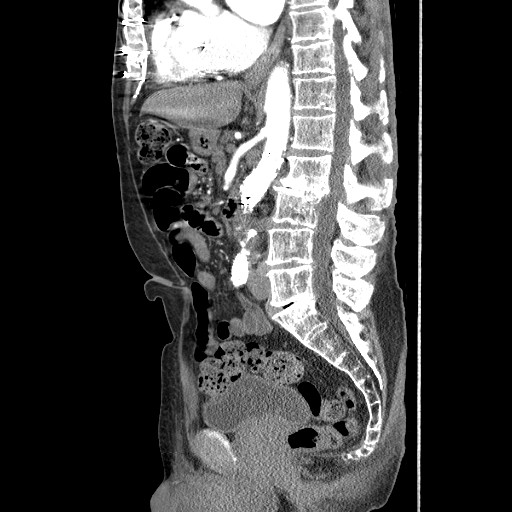
[im 109/145  soft-tissue]
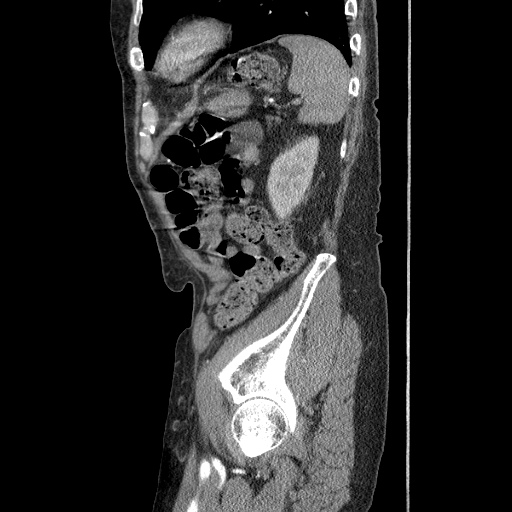

[Series 605: sagittal body · sagittal · 0.70mm/px · 3 of 138 slices shown (2 of 2)]
[im 35/138  soft-tissue]
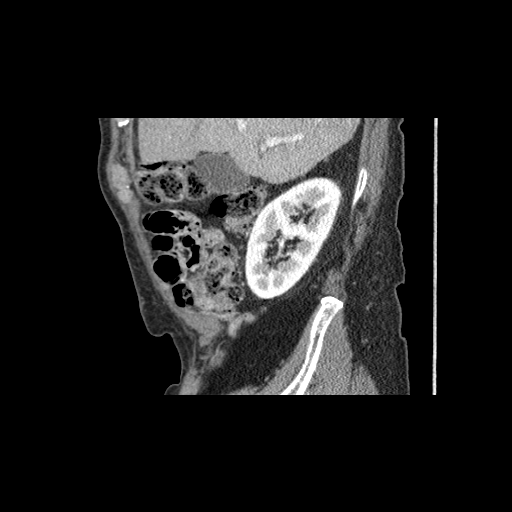
[im 69/138  soft-tissue]
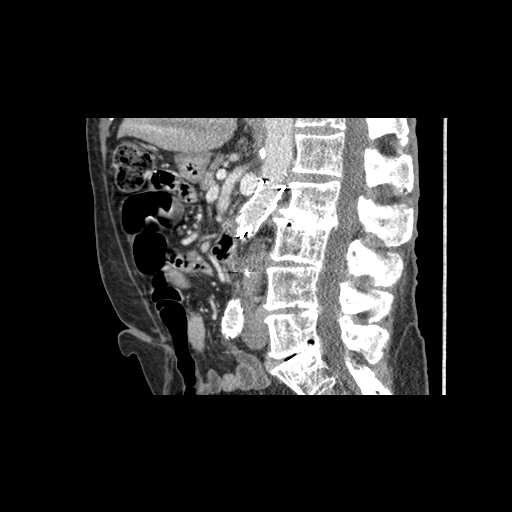
[im 103/138  soft-tissue]
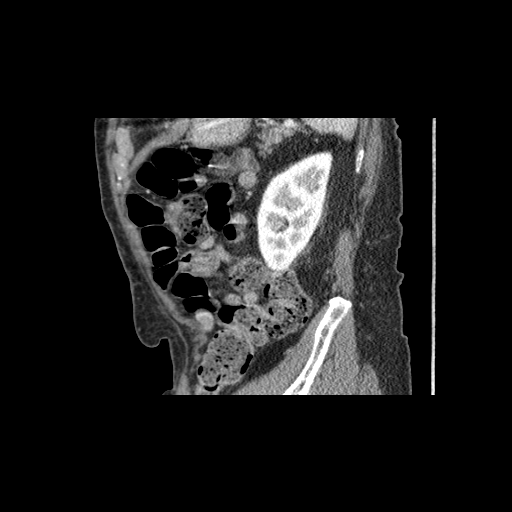

[12 of 36 positions shown; findings below may reference images not displayed]

FINDINGS: Aortobi-iliac stent graft repair has been performed.
There is no evidence of Endo leak.  The stent begins just below the
takeoff of the right renal artery and dominant left renal artery.
Both limbs are widely patent.  Landing zone occurs in the distal
common iliac arteries bilaterally.

Renal arteries are widely patent bilaterally.  Mild plaque at the
origin of the SMA which is also widely patent.  IMA branches
reconstitute.

Bilateral common, internal, and external iliac arteries are widely
patent and mildly tortuous.  Greatest AP and transverse diameters
on image 73 of series 5 for the aneurysm sac are 4.9 and 4.9 cm
respectively.  This is stable.

The liver, gallbladder, spleen, pancreas are within normal limits.
Stable bilateral benign adrenal adenomas.

Several benign appearing renal cysts.

Severe L2-3 degenerative disc disease.

 Review of the MIP images confirms the above findings.
IMPRESSION: Status post aortobi-iliac stent graft repair for abdominal aortic
aneurysm and no evidence of Endo leak or complication.  Stable
aneurysm sac diameter at 4.9 cm.

## 2013-08-14 ENCOUNTER — Telehealth: Payer: Self-pay | Admitting: Internal Medicine

## 2013-08-14 NOTE — Telephone Encounter (Signed)
New message  Pt said that he sent a transmission on 07/17/2013 has not received a letter confirming that it was received// Please call back to discuss.

## 2013-08-14 NOTE — Telephone Encounter (Signed)
LMOVM to let know transmission was received/kwm

## 2013-08-18 ENCOUNTER — Ambulatory Visit: Payer: Medicare Other | Admitting: Vascular Surgery

## 2013-08-18 ENCOUNTER — Other Ambulatory Visit: Payer: Medicare Other

## 2013-08-22 ENCOUNTER — Ambulatory Visit (INDEPENDENT_AMBULATORY_CARE_PROVIDER_SITE_OTHER): Payer: Medicare Other | Admitting: Pharmacist

## 2013-08-22 ENCOUNTER — Other Ambulatory Visit: Payer: Self-pay | Admitting: Vascular Surgery

## 2013-08-22 DIAGNOSIS — I4891 Unspecified atrial fibrillation: Secondary | ICD-10-CM

## 2013-08-22 DIAGNOSIS — I714 Abdominal aortic aneurysm, without rupture, unspecified: Secondary | ICD-10-CM | POA: Diagnosis not present

## 2013-08-22 LAB — POCT INR: INR: 3.1

## 2013-08-23 LAB — CREATININE, SERUM: Creat: 0.85 mg/dL (ref 0.50–1.35)

## 2013-08-23 LAB — BUN: BUN: 16 mg/dL (ref 6–23)

## 2013-08-24 ENCOUNTER — Encounter: Payer: Self-pay | Admitting: Vascular Surgery

## 2013-08-25 ENCOUNTER — Ambulatory Visit (INDEPENDENT_AMBULATORY_CARE_PROVIDER_SITE_OTHER): Payer: Medicare Other | Admitting: Vascular Surgery

## 2013-08-25 ENCOUNTER — Ambulatory Visit
Admission: RE | Admit: 2013-08-25 | Discharge: 2013-08-25 | Disposition: A | Discharge status: Home / Self Care | Payer: Medicare Other | Source: Ambulatory Visit | Attending: Vascular Surgery | Admitting: Vascular Surgery

## 2013-08-25 ENCOUNTER — Encounter: Payer: Self-pay | Admitting: Vascular Surgery

## 2013-08-25 VITALS — BP 134/80 | HR 81 | Ht 68.0 in | Wt 166.0 lb

## 2013-08-25 DIAGNOSIS — I714 Abdominal aortic aneurysm, without rupture, unspecified: Secondary | ICD-10-CM

## 2013-08-25 DIAGNOSIS — Z48812 Encounter for surgical aftercare following surgery on the circulatory system: Secondary | ICD-10-CM

## 2013-08-25 MED ORDER — IOHEXOL 350 MG/ML SOLN
75.0000 mL | Freq: Once | INTRAVENOUS | Status: AC | PRN
  Administered 2013-08-25: 75 mL via INTRAVENOUS

## 2013-08-25 NOTE — Progress Notes (Signed)
    Established EVAR  History of Present Illness  Adam [name] is a 80 y.o. (1924/11/24) male who presents for routine follow up s/p EVAR (Date: 07/28/11). Most recent EVAR duplex (Date: 02/03/13) demonstrates: no endoleak and decreaing sac size. Most recent CTA was today. The patient has had no back or abdominal pain.   Past Medical History, Past Surgical History, Social History, Family History, Medications, Allergies, and Review of Systems are unchanged from previous evaluation on 08/05/12.   On ROS: no back or abdominal pain. No claudication as limited activity, no rest pain   Physical Examination  Filed Vitals:   08/25/13 1114  BP: 134/80  Pulse: 81  Height: 5\' 8"  (1.727 m)  Weight: 166 lb (75.297 kg)  SpO2: 100%   Body mass index is 25.25 kg/(m^2).  General: A&O x 3, WDWN   Eyes: PERRLA, EOMI   Pulmonary: Sym exp, good air movt, CTAB, no rales, rhonchi, & wheezing   Cardiac: RRR, Nl S1, S2, no Murmurs, rubs or gallops   Vascular:  Vessel  Right  Left   Radial  Palpable  Palpable   Brachial  Palpable  Palpable   Carotid  Palpable, without bruit  Palpable, without bruit   Aorta  Not palpable  N/A   Femoral  Palpable  Palpable   Popliteal  Not palpable  Not palpable   PT  Palpable  Palpable   DP  Palpable  Palpable    Gastrointestinal: soft, NTND, -G/R, - HSM, - masses, - CVAT B   Musculoskeletal: M/S 5/5 throughout , Extremities without ischemic changes   Neurologic: Pain and light touch intact in extremities , Motor exam as listed above   CTA Abd/pelvis (08/25/2013)  Stable aortic stent graft repair of the infrarenal abdominal aortic aneurysm.   Stable small type 2 endoleak.   Stable native aneurysm sac diameter.  Based on my review of this patient's CTA, his aneurysm sac is unchanged.  The EVAR remains in good infrarenal position without evidence of sac size enlargement or change in morphology.  I see a faint impression of possible posterior Type II  endoleak.  Both iliac limbs are widely patent.  Medical Decision Making  GREEN QUINCY is a 80 y.o. male who presents s/p EVAR. Pt is asymptomatic with stable sac size.   9 out 10 Type II endoleaks spontaneously thrombose.  Since he is on coumadin, this probably is lower in probability, but I doubt any benefit to trying to treat the endoleak as the sac is completely stable, suggesting minimal pressurization of the sac.   I discussed with the patient the importance of surveillance of the endograft.  The next endograft duplex will be scheduled for 12 months.  The next CTA after that would be at the 5 year mark.  Thank you for allowing [place] to participate in this patient's care.  Adele Barthel, MD [facility] [place] Office: [phone] Pager: [phone]  08/25/2013, 11:31 AM

## 2013-08-25 NOTE — Addendum Note (Signed)
Addended by: Peter Minium K on: 08/25/2013 02:00 PM   Modules accepted: Orders

## 2013-08-30 ENCOUNTER — Encounter: Payer: Self-pay | Admitting: *Deleted

## 2013-09-03 ENCOUNTER — Other Ambulatory Visit: Payer: Self-pay | Admitting: Internal Medicine

## 2013-09-05 ENCOUNTER — Other Ambulatory Visit: Payer: Self-pay | Admitting: Internal Medicine

## 2013-09-05 ENCOUNTER — Encounter: Payer: Self-pay | Admitting: Internal Medicine

## 2013-09-06 DIAGNOSIS — I714 Abdominal aortic aneurysm, without rupture, unspecified: Secondary | ICD-10-CM | POA: Diagnosis not present

## 2013-09-06 DIAGNOSIS — I25810 Atherosclerosis of coronary artery bypass graft(s) without angina pectoris: Secondary | ICD-10-CM | POA: Diagnosis not present

## 2013-09-06 DIAGNOSIS — Z950 Presence of cardiac pacemaker: Secondary | ICD-10-CM | POA: Diagnosis not present

## 2013-09-06 DIAGNOSIS — Z79899 Other long term (current) drug therapy: Secondary | ICD-10-CM | POA: Diagnosis not present

## 2013-09-06 DIAGNOSIS — Z1331 Encounter for screening for depression: Secondary | ICD-10-CM | POA: Diagnosis not present

## 2013-09-06 DIAGNOSIS — K219 Gastro-esophageal reflux disease without esophagitis: Secondary | ICD-10-CM | POA: Diagnosis not present

## 2013-09-06 DIAGNOSIS — E7800 Pure hypercholesterolemia, unspecified: Secondary | ICD-10-CM | POA: Diagnosis not present

## 2013-09-06 DIAGNOSIS — Z0000 Encounter for general adult medical examination without abnormal findings: Secondary | ICD-10-CM | POA: Diagnosis not present

## 2013-10-06 ENCOUNTER — Telehealth: Payer: Self-pay | Admitting: *Deleted

## 2013-10-06 ENCOUNTER — Ambulatory Visit (INDEPENDENT_AMBULATORY_CARE_PROVIDER_SITE_OTHER): Payer: Medicare Other | Admitting: *Deleted

## 2013-10-06 DIAGNOSIS — Z5181 Encounter for therapeutic drug level monitoring: Secondary | ICD-10-CM | POA: Diagnosis not present

## 2013-10-06 DIAGNOSIS — I4891 Unspecified atrial fibrillation: Secondary | ICD-10-CM

## 2013-10-06 LAB — POCT INR: INR: 1.7

## 2013-10-06 NOTE — Telephone Encounter (Signed)
Patient is getting K+ drawn every 3 months as he is on Tikosyn.  Dr. Maxwell Caul drew this last month and it was normal at 4.8.  Patient would like to have potassium drawn here moving forward every 3 months as he comes for protimes.  Is this okay with you?  His next one would be due middle of April.  He is going to see you in April, so will need to get potassium done then.  Thanks.

## 2013-10-09 NOTE — Telephone Encounter (Signed)
ok 

## 2013-10-11 ENCOUNTER — Encounter: Payer: Medicare Other | Admitting: Internal Medicine

## 2013-10-19 ENCOUNTER — Encounter: Payer: Self-pay | Admitting: Internal Medicine

## 2013-10-19 ENCOUNTER — Encounter (HOSPITAL_COMMUNITY): Payer: Self-pay | Admitting: Pharmacy Technician

## 2013-10-19 ENCOUNTER — Ambulatory Visit (INDEPENDENT_AMBULATORY_CARE_PROVIDER_SITE_OTHER): Payer: Medicare Other | Admitting: Internal Medicine

## 2013-10-19 ENCOUNTER — Encounter: Payer: Self-pay | Admitting: *Deleted

## 2013-10-19 ENCOUNTER — Ambulatory Visit (INDEPENDENT_AMBULATORY_CARE_PROVIDER_SITE_OTHER): Payer: Medicare Other | Admitting: Pharmacist

## 2013-10-19 VITALS — BP 125/69 | HR 65 | Ht 67.0 in | Wt 165.0 lb

## 2013-10-19 DIAGNOSIS — Z01812 Encounter for preprocedural laboratory examination: Secondary | ICD-10-CM

## 2013-10-19 DIAGNOSIS — I495 Sick sinus syndrome: Secondary | ICD-10-CM | POA: Diagnosis not present

## 2013-10-19 DIAGNOSIS — I4891 Unspecified atrial fibrillation: Secondary | ICD-10-CM

## 2013-10-19 DIAGNOSIS — Z950 Presence of cardiac pacemaker: Secondary | ICD-10-CM

## 2013-10-19 DIAGNOSIS — Z5181 Encounter for therapeutic drug level monitoring: Secondary | ICD-10-CM

## 2013-10-19 LAB — MDC_IDC_ENUM_SESS_TYPE_INCLINIC
Battery Voltage: 2.9 V
Brady Statistic AP VS Percent: 0.19 %
Brady Statistic RA Percent Paced: 88.81 %
Date Time Interrogation Session: 20150305105316
Lead Channel Impedance Value: 360 Ohm
Lead Channel Impedance Value: 392 Ohm
Lead Channel Sensing Intrinsic Amplitude: 2.8371
Lead Channel Sensing Intrinsic Amplitude: 9.3139
Lead Channel Setting Pacing Amplitude: 2.5 V
Lead Channel Setting Sensing Sensitivity: 0.9 mV
MDC IDC SET LEADCHNL RA PACING AMPLITUDE: 2 V
MDC IDC SET LEADCHNL RV PACING PULSEWIDTH: 0.4 ms
MDC IDC SET ZONE DETECTION INTERVAL: 400 ms
MDC IDC STAT BRADY AP VP PERCENT: 88.62 %
MDC IDC STAT BRADY AS VP PERCENT: 9.6 %
MDC IDC STAT BRADY AS VS PERCENT: 1.59 %
MDC IDC STAT BRADY RV PERCENT PACED: 98.23 %
Zone Setting Detection Interval: 370 ms

## 2013-10-19 LAB — POCT INR: INR: 2.9

## 2013-10-19 MED ORDER — FUROSEMIDE 40 MG PO TABS: 40.0000 mg | ORAL_TABLET | Freq: Every day | ORAL | Status: DC

## 2013-10-19 NOTE — Progress Notes (Signed)
Patient Care Team: Horton Finer, MD as PCP - General (Internal Medicine) Casandra Doffing, MD (Cardiology) Sinclair Grooms, MD (Cardiology)   HPI  Adam [name] is a 80 y.o. 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   Last assessment of left ventricular function is 2000. Aspirin was normal  He has some complaints of shortness of breath that dates back a couple of weeks. Edema may be worse over the same period of time.    Past Medical History  Diagnosis Date  . S/P CABG (coronary artery bypass graft)   . Hyperlipidemia   . GERD (gastroesophageal reflux disease)   . Presence of permanent cardiac pacemaker   . Chronic anticoagulation   . LBP (low back pain)   . AAA (abdominal aortic aneurysm)   . Diverticulosis   . Arthritis   . 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 PAF (paroxysmal atrial fibrillation)     Cardioversion June 2011 & IN 2012  . CHF (congestive heart failure)     Past Surgical History  Procedure Laterality Date  . Coronary artery bypass graft  Feb 1989  . Permanent pacemaker  July 2008  . Tonsillectomy    . Cataract extraction    . Joint replacement  1995 & 1996    Bilateral total knee replacements  . Endovascular stent insertion  07/28/2011    Procedure: ENDOVASCULAR STENT GRAFT INSERTION;  Surgeon: Hinda Lenis, MD;  Location: [place];  Service: Vascular;  Laterality: N/A;  Pt. on Coumadin/ instructed to hold 5 days prior to procedure  . Cardioversion  06/30/2012    Procedure: CARDIOVERSION;  Surgeon: Sinclair Grooms, MD;  Location: [facility] ENDOSCOPY;  Service: Cardiovascular;  Laterality: N/A;    Current Outpatient Prescriptions  Medication Sig Dispense Refill  . acetaminophen  (TYLENOL) 650 MG CR tablet Take 650 mg by mouth 2 (two) times daily.       [name] atorvastatin (LIPITOR) 10 MG tablet Take 10 mg by mouth daily.        [name] dofetilide (TIKOSYN) 125 MCG capsule Take 1 capsule (125 mcg total) by mouth 2 (two) times daily.  60 capsule  10  . furosemide (LASIX) 20 MG tablet Take two tablet by mouth once daily.      [name] latanoprost (XALATAN) 0.005 % ophthalmic solution Place 1 drop into both eyes at bedtime.       . metoprolol tartrate (LOPRESSOR) 25 MG tablet Take 1 tablet (25 mg total) by mouth 2 (two) times daily.  180 tablet  1  . metoprolol tartrate (LOPRESSOR) 25 MG tablet TAKE 1 TABLET BY MOUTH TWICE DAILY WITH FOOD  180 tablet  0  . Multiple Vitamins-Minerals (RA VISION-VITE PRESERVE PO) Take 1 tablet by mouth 2 (two) times daily.      . nitroGLYCERIN (NITROSTAT) 0.4 MG SL tablet Place 0.4 mg under the tongue every 5 (five) minutes as needed. For chest pain      . Omega-3 Fatty Acids (FISH OIL) 1000 MG CPDR Take 1 capsule by mouth daily.       [name] omeprazole (PRILOSEC OTC) 20 MG tablet Take 20 mg by mouth daily.      [name]  silodosin (RAPAFLO) 4 MG CAPS capsule Take 4 mg by mouth as needed.       [name] VITAMIN D, CHOLECALCIFEROL, PO Take 2,000 Int'l Units by mouth daily.        [name] warfarin (COUMADIN) 5 MG tablet Take 5 mg by mouth daily. MWF=1 tablet  All other days = 1/2 tablet   Last dose taken 07/22/11 per patient       No current facility-administered medications for this visit.    Allergies  Allergen Reactions  . Morphine Nausea Only    Review of Systems negative except from HPI and PMH  Physical Exam BP 125/69  Pulse 65  Ht 5\' 7"  (1.702 m)  Wt 165 lb (74.844 kg)  BMI 25.84 kg/m2 Well developed and well nourished in no acute distress HENT normal E scleral and icterus clear Neck Supple JVP flat; carotids brisk and full Clear to ausculation  Regular rate and rhythm, no murmurs gallops or rub Soft with active bowel sounds No clubbing cyanosis  2+ Edema Alert  and oriented, gro= ssly normal motor and sensory function Skin Warm and Dry  ECG demonstrates P. synchronous pacing(although this should be serendipity as his pacemaker is programmed VVI  Assessment and  Plan  Sinus node dysfunction  Pacemaker-Medtronic  Atrial fibrillation on dofetilide  Coronary artery disease with prior bypass  Peripheral edema /congestive heart failure     The patient's device has reached ERI. With this was reversion to VVI pacing which may be responsible for some of the shortness of breath and edema. His device has been reprogrammed to DDD. I wonder whether it is appropriate after 6 or 7 years to get a repeat evaluation of left ventricular function and normal coronary perfusion. I will defer this to Dr. Linard Millers  We have reviewed the benefits and risks of generator replacement.  These include but are not limited to lead fracture and infection.  The patient understands, agrees and is willing to proceed.   Potassium level is 4.1 for his PCP.   Blood pressure is stable. No symptoms of angina.    Rhythm is stable post pacing  We will increase his diuretic from 40--80 daily for 10 days. He'll let us know thereafter as to what has happened to his weight

## 2013-10-19 NOTE — Patient Instructions (Addendum)
Your physician has recommended you make the following change in your medication:  1) Increase Lasix to 80 mg daily for 10 days -- then decrease to 40 mg daily  Your physician has recommended that you have a pacemaker generator change 11/03/13  Your physician recommends that you return for pre-procedure lab work on 10/30/2013  Your physician recommends that you schedule a follow-up appointment on 11/15/13  for wound check.

## 2013-10-24 ENCOUNTER — Other Ambulatory Visit: Payer: Self-pay | Admitting: Interventional Cardiology

## 2013-10-25 DIAGNOSIS — J310 Chronic rhinitis: Secondary | ICD-10-CM | POA: Diagnosis not present

## 2013-10-30 ENCOUNTER — Other Ambulatory Visit: Payer: Medicare Other

## 2013-10-30 ENCOUNTER — Telehealth: Payer: Self-pay | Admitting: Internal Medicine

## 2013-10-30 NOTE — Telephone Encounter (Signed)
Advised to plan for procedure unless pt develops fever. Pre procedure lab changed to tomorrow 3/17, along with Coumadin check. Pt's wife verbalized understanding and agreeable to plan.

## 2013-10-30 NOTE — Telephone Encounter (Signed)
Pt calling c/o sinus drainage/coughing since last Tuesday. He states he went to PCP who recommended Nasocort for 1 week and daily Claritin. No improvement in symptoms. He is questioning still having procedure on Friday. Explained I would discuss with Dr. Caryl Comes and let him know. Pt agreeable to plan.

## 2013-10-30 NOTE — Telephone Encounter (Signed)
New Message:  Pt's wife is calling to speak w/ Sherri. States she will give more details when she calls back.

## 2013-10-31 ENCOUNTER — Ambulatory Visit (INDEPENDENT_AMBULATORY_CARE_PROVIDER_SITE_OTHER): Payer: Medicare Other | Admitting: Pharmacist Clinician (PhC)/ Clinical Pharmacy Specialist

## 2013-10-31 ENCOUNTER — Other Ambulatory Visit (INDEPENDENT_AMBULATORY_CARE_PROVIDER_SITE_OTHER): Payer: Medicare Other

## 2013-10-31 DIAGNOSIS — Z950 Presence of cardiac pacemaker: Secondary | ICD-10-CM | POA: Diagnosis not present

## 2013-10-31 DIAGNOSIS — I4891 Unspecified atrial fibrillation: Secondary | ICD-10-CM

## 2013-10-31 DIAGNOSIS — Z5181 Encounter for therapeutic drug level monitoring: Secondary | ICD-10-CM | POA: Diagnosis not present

## 2013-10-31 DIAGNOSIS — Z01812 Encounter for preprocedural laboratory examination: Secondary | ICD-10-CM

## 2013-10-31 DIAGNOSIS — I495 Sick sinus syndrome: Secondary | ICD-10-CM

## 2013-10-31 LAB — BASIC METABOLIC PANEL
BUN: 19 mg/dL (ref 6–23)
CALCIUM: 8.8 mg/dL (ref 8.4–10.5)
CO2: 31 mEq/L (ref 19–32)
Chloride: 98 mEq/L (ref 96–112)
Creatinine, Ser: 1.1 mg/dL (ref 0.4–1.5)
GFR: 71.05 mL/min (ref >60.00)
GLUCOSE: 99 mg/dL (ref 70–99)
Potassium: 4.9 mEq/L (ref 3.5–5.1)
Sodium: 138 mEq/L (ref 135–145)

## 2013-10-31 LAB — CBC WITH DIFFERENTIAL/PLATELET
BASOS ABS: 0 10*3/uL (ref 0.0–0.1)
Basophils Relative: 0.2 % (ref 0.0–3.0)
Eosinophils Absolute: 0.1 10*3/uL (ref 0.0–0.7)
Eosinophils Relative: 1.3 % (ref 0.0–5.0)
HCT: 37.2 % — ABNORMAL LOW (ref 39.0–52.0)
Hemoglobin: 12.3 g/dL — ABNORMAL LOW (ref 13.0–17.0)
LYMPHS PCT: 24.6 % (ref 12.0–46.0)
Lymphs Abs: 1.7 10*3/uL (ref 0.7–4.0)
MCHC: 33.2 g/dL (ref 30.0–36.0)
MCV: 101.2 fl — ABNORMAL HIGH (ref 78.0–100.0)
MONOS PCT: 11.4 % (ref 3.0–12.0)
Monocytes Absolute: 0.8 10*3/uL (ref 0.1–1.0)
NEUTROS ABS: 4.4 10*3/uL (ref 1.4–7.7)
Neutrophils Relative %: 62.5 % (ref 43.0–77.0)
Platelets: 93 10*3/uL — ABNORMAL LOW (ref 150.0–400.0)
RBC: 3.67 Mil/uL — ABNORMAL LOW (ref 4.22–5.81)
RDW: 13.6 % (ref 11.5–14.6)
WBC: 7 10*3/uL (ref 4.5–10.5)

## 2013-10-31 LAB — POCT INR: INR: 3.7

## 2013-10-31 LAB — PROTIME-INR
INR: 4 ratio — AB (ref 0.8–1.0)
Prothrombin Time: 41.1 s — ABNORMAL HIGH (ref 10.2–12.4)

## 2013-11-02 ENCOUNTER — Other Ambulatory Visit: Payer: Self-pay | Admitting: *Deleted

## 2013-11-02 DIAGNOSIS — I509 Heart failure, unspecified: Secondary | ICD-10-CM | POA: Diagnosis not present

## 2013-11-02 DIAGNOSIS — Z951 Presence of aortocoronary bypass graft: Secondary | ICD-10-CM | POA: Diagnosis not present

## 2013-11-02 DIAGNOSIS — K219 Gastro-esophageal reflux disease without esophagitis: Secondary | ICD-10-CM | POA: Diagnosis not present

## 2013-11-02 DIAGNOSIS — E785 Hyperlipidemia, unspecified: Secondary | ICD-10-CM | POA: Diagnosis not present

## 2013-11-02 DIAGNOSIS — I4891 Unspecified atrial fibrillation: Secondary | ICD-10-CM | POA: Diagnosis not present

## 2013-11-02 DIAGNOSIS — I714 Abdominal aortic aneurysm, without rupture, unspecified: Secondary | ICD-10-CM | POA: Diagnosis not present

## 2013-11-02 DIAGNOSIS — Z45018 Encounter for adjustment and management of other part of cardiac pacemaker: Secondary | ICD-10-CM | POA: Diagnosis not present

## 2013-11-02 DIAGNOSIS — I2510 Atherosclerotic heart disease of native coronary artery without angina pectoris: Secondary | ICD-10-CM | POA: Diagnosis not present

## 2013-11-02 DIAGNOSIS — Z96659 Presence of unspecified artificial knee joint: Secondary | ICD-10-CM | POA: Diagnosis not present

## 2013-11-02 DIAGNOSIS — Z7901 Long term (current) use of anticoagulants: Secondary | ICD-10-CM | POA: Diagnosis not present

## 2013-11-02 DIAGNOSIS — I495 Sick sinus syndrome: Secondary | ICD-10-CM | POA: Diagnosis not present

## 2013-11-02 MED ORDER — CHLORHEXIDINE GLUCONATE 4 % EX LIQD
60.0000 mL | Freq: Once | CUTANEOUS | Status: DC
Start: 2013-11-02 — End: 2013-11-03
  Filled 2013-11-02: qty 60

## 2013-11-02 MED ORDER — SODIUM CHLORIDE 0.9 % IR SOLN
80.0000 mg | Status: AC
  Filled 2013-11-02: qty 2

## 2013-11-02 MED ORDER — CEFAZOLIN SODIUM-DEXTROSE 2-3 GM-% IV SOLR
2.0000 g | INTRAVENOUS | Status: AC
Start: 2013-11-02 — End: 2013-11-03
  Filled 2013-11-02: qty 50

## 2013-11-02 MED ORDER — PHYTONADIONE 5 MG PO TABS: 5.0000 mg | ORAL_TABLET | Freq: Once | ORAL | Status: DC

## 2013-11-02 MED ORDER — CHLORHEXIDINE GLUCONATE 4 % EX LIQD
60.0000 mL | Freq: Once | CUTANEOUS | Status: DC
  Filled 2013-11-02: qty 60

## 2013-11-02 MED ORDER — SODIUM CHLORIDE 0.9 % IV SOLN
INTRAVENOUS | Status: DC
Start: 2013-11-02 — End: 2013-11-03
  Administered 2013-11-03: 14:00:00 via INTRAVENOUS

## 2013-11-03 ENCOUNTER — Ambulatory Visit (HOSPITAL_COMMUNITY)
Admission: RE | Admit: 2013-11-03 | Discharge: 2013-11-03 | Disposition: A | Discharge status: Home / Self Care | Payer: Medicare Other | Source: Ambulatory Visit | Attending: Internal Medicine | Admitting: Internal Medicine

## 2013-11-03 ENCOUNTER — Encounter (HOSPITAL_COMMUNITY)
Admission: RE | Disposition: A | Discharge status: Home / Self Care | Payer: Self-pay | Source: Ambulatory Visit | Attending: Internal Medicine

## 2013-11-03 DIAGNOSIS — Z951 Presence of aortocoronary bypass graft: Secondary | ICD-10-CM | POA: Insufficient documentation

## 2013-11-03 DIAGNOSIS — I714 Abdominal aortic aneurysm, without rupture, unspecified: Secondary | ICD-10-CM | POA: Insufficient documentation

## 2013-11-03 DIAGNOSIS — K219 Gastro-esophageal reflux disease without esophagitis: Secondary | ICD-10-CM | POA: Insufficient documentation

## 2013-11-03 DIAGNOSIS — I2510 Atherosclerotic heart disease of native coronary artery without angina pectoris: Secondary | ICD-10-CM | POA: Insufficient documentation

## 2013-11-03 DIAGNOSIS — Z45018 Encounter for adjustment and management of other part of cardiac pacemaker: Secondary | ICD-10-CM | POA: Insufficient documentation

## 2013-11-03 DIAGNOSIS — I495 Sick sinus syndrome: Secondary | ICD-10-CM | POA: Insufficient documentation

## 2013-11-03 DIAGNOSIS — Z96659 Presence of unspecified artificial knee joint: Secondary | ICD-10-CM | POA: Insufficient documentation

## 2013-11-03 DIAGNOSIS — Z950 Presence of cardiac pacemaker: Secondary | ICD-10-CM

## 2013-11-03 DIAGNOSIS — E785 Hyperlipidemia, unspecified: Secondary | ICD-10-CM | POA: Insufficient documentation

## 2013-11-03 DIAGNOSIS — I4891 Unspecified atrial fibrillation: Secondary | ICD-10-CM

## 2013-11-03 DIAGNOSIS — I509 Heart failure, unspecified: Secondary | ICD-10-CM | POA: Insufficient documentation

## 2013-11-03 DIAGNOSIS — Z7901 Long term (current) use of anticoagulants: Secondary | ICD-10-CM | POA: Insufficient documentation

## 2013-11-03 HISTORY — PX: PERMANENT PACEMAKER GENERATOR CHANGE: SHX6022

## 2013-11-03 LAB — PROTIME-INR
INR: 1.3 (ref 0.00–1.49)
PROTHROMBIN TIME: 15.9 s — AB (ref 11.6–15.2)

## 2013-11-03 LAB — SURGICAL PCR SCREEN
MRSA, PCR: NEGATIVE
Staphylococcus aureus: NEGATIVE

## 2013-11-03 SURGERY — PERMANENT PACEMAKER GENERATOR CHANGE
Anesthesia: LOCAL

## 2013-11-03 MED ORDER — ONDANSETRON HCL 4 MG/2ML IJ SOLN: 4.0000 mg | Freq: Four times a day (QID) | INTRAMUSCULAR | Status: DC | PRN

## 2013-11-03 MED ORDER — FENTANYL CITRATE 0.05 MG/ML IJ SOLN
INTRAMUSCULAR | Status: AC
  Filled 2013-11-03: qty 2

## 2013-11-03 MED ORDER — MIDAZOLAM HCL 5 MG/5ML IJ SOLN
INTRAMUSCULAR | Status: AC
  Filled 2013-11-03: qty 5

## 2013-11-03 MED ORDER — LIDOCAINE HCL (PF) 1 % IJ SOLN
INTRAMUSCULAR | Status: AC
  Filled 2013-11-03: qty 30

## 2013-11-03 MED ORDER — SODIUM CHLORIDE 0.9 % IV SOLN: INTRAVENOUS | Status: DC

## 2013-11-03 MED ORDER — CEFAZOLIN SODIUM 1-5 GM-% IV SOLN
INTRAVENOUS | Status: AC
  Filled 2013-11-03: qty 50

## 2013-11-03 MED ORDER — ACETAMINOPHEN 325 MG PO TABS
325.0000 mg | ORAL_TABLET | ORAL | Status: DC | PRN
  Filled 2013-11-03: qty 2

## 2013-11-03 MED ORDER — MUPIROCIN 2 % EX OINT
TOPICAL_OINTMENT | Freq: Two times a day (BID) | CUTANEOUS | Status: DC
  Filled 2013-11-03: qty 22

## 2013-11-03 NOTE — Interval H&P Note (Signed)
History and Physical Interval Note:  11/03/2013 3:19 PM  Adam [name]  has presented today for surgery, with the diagnosis of afib  The various methods of treatment have been discussed with the patient and family. After consideration of risks, benefits and other options for treatment, the patient has consented to  Procedure(s): PERMANENT PACEMAKER GENERATOR CHANGE (N/A) as a surgical intervention .  The patient's history has been reviewed, patient examined, no change in status, stable for surgery.  I have reviewed the patient's chart and labs.  Questions were answered to the patient's satisfaction.     Virl Axe

## 2013-11-03 NOTE — H&P (View-Only) (Signed)
Patient Care Team: Horton Finer, MD as PCP - General (Internal Medicine) Casandra Doffing, MD (Cardiology) Sinclair Grooms, MD (Cardiology)   HPI  Adam [name] is a 80 y.o. 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   Last assessment of left ventricular function is 2000. Aspirin was normal  He has some complaints of shortness of breath that dates back a couple of weeks. Edema may be worse over the same period of time.    Past Medical History  Diagnosis Date  . S/P CABG (coronary artery bypass graft)   . Hyperlipidemia   . GERD (gastroesophageal reflux disease)   . Presence of permanent cardiac pacemaker   . Chronic anticoagulation   . LBP (low back pain)   . AAA (abdominal aortic aneurysm)   . Diverticulosis   . Arthritis   . 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 PAF (paroxysmal atrial fibrillation)     Cardioversion June 2011 & IN 2012  . CHF (congestive heart failure)     Past Surgical History  Procedure Laterality Date  . Coronary artery bypass graft  Feb 1989  . Permanent pacemaker  July 2008  . Tonsillectomy    . Cataract extraction    . Joint replacement  1995 & 1996    Bilateral total knee replacements  . Endovascular stent insertion  07/28/2011    Procedure: ENDOVASCULAR STENT GRAFT INSERTION;  Surgeon: Hinda Lenis, MD;  Location: [place];  Service: Vascular;  Laterality: N/A;  Pt. on Coumadin/ instructed to hold 5 days prior to procedure  . Cardioversion  06/30/2012    Procedure: CARDIOVERSION;  Surgeon: Sinclair Grooms, MD;  Location: [facility] ENDOSCOPY;  Service: Cardiovascular;  Laterality: N/A;    Current Outpatient Prescriptions  Medication Sig Dispense Refill  . acetaminophen  (TYLENOL) 650 MG CR tablet Take 650 mg by mouth 2 (two) times daily.       [name] atorvastatin (LIPITOR) 10 MG tablet Take 10 mg by mouth daily.        [name] dofetilide (TIKOSYN) 125 MCG capsule Take 1 capsule (125 mcg total) by mouth 2 (two) times daily.  60 capsule  10  . furosemide (LASIX) 20 MG tablet Take two tablet by mouth once daily.      [name] latanoprost (XALATAN) 0.005 % ophthalmic solution Place 1 drop into both eyes at bedtime.       . metoprolol tartrate (LOPRESSOR) 25 MG tablet Take 1 tablet (25 mg total) by mouth 2 (two) times daily.  180 tablet  1  . metoprolol tartrate (LOPRESSOR) 25 MG tablet TAKE 1 TABLET BY MOUTH TWICE DAILY WITH FOOD  180 tablet  0  . Multiple Vitamins-Minerals (RA VISION-VITE PRESERVE PO) Take 1 tablet by mouth 2 (two) times daily.      . nitroGLYCERIN (NITROSTAT) 0.4 MG SL tablet Place 0.4 mg under the tongue every 5 (five) minutes as needed. For chest pain      . Omega-3 Fatty Acids (FISH OIL) 1000 MG CPDR Take 1 capsule by mouth daily.       [name] omeprazole (PRILOSEC OTC) 20 MG tablet Take 20 mg by mouth daily.      [name]  silodosin (RAPAFLO) 4 MG CAPS capsule Take 4 mg by mouth as needed.       [name] VITAMIN D, CHOLECALCIFEROL, PO Take 2,000 Int'l Units by mouth daily.        [name] warfarin (COUMADIN) 5 MG tablet Take 5 mg by mouth daily. MWF=1 tablet  All other days = 1/2 tablet   Last dose taken 07/22/11 per patient       No current facility-administered medications for this visit.    Allergies  Allergen Reactions  . Morphine Nausea Only    Review of Systems negative except from HPI and PMH  Physical Exam BP 125/69  Pulse 65  Ht 5\' 7"  (1.702 m)  Wt 165 lb (74.844 kg)  BMI 25.84 kg/m2 Well developed and well nourished in no acute distress HENT normal E scleral and icterus clear Neck Supple JVP flat; carotids brisk and full Clear to ausculation  Regular rate and rhythm, no murmurs gallops or rub Soft with active bowel sounds No clubbing cyanosis  2+ Edema Alert  and oriented, gro= ssly normal motor and sensory function Skin Warm and Dry  ECG demonstrates P. synchronous pacing(although this should be serendipity as his pacemaker is programmed VVI  Assessment and  Plan  Sinus node dysfunction  Pacemaker-Medtronic  Atrial fibrillation on dofetilide  Coronary artery disease with prior bypass  Peripheral edema /congestive heart failure     The patient's device has reached ERI. With this was reversion to VVI pacing which may be responsible for some of the shortness of breath and edema. His device has been reprogrammed to DDD. I wonder whether it is appropriate after 6 or 7 years to get a repeat evaluation of left ventricular function and normal coronary perfusion. I will defer this to Dr. Linard Millers  We have reviewed the benefits and risks of generator replacement.  These include but are not limited to lead fracture and infection.  The patient understands, agrees and is willing to proceed.   Potassium level is 4.1 for his PCP.   Blood pressure is stable. No symptoms of angina.    Rhythm is stable post pacing  We will increase his diuretic from 40--80 daily for 10 days. He'll let us know thereafter as to what has happened to his weight

## 2013-11-03 NOTE — CV Procedure (Signed)
Preoperative diagnosis tachybrady syndrome post pacer ERI Postoperative diagnosis same/   Procedure: Generator replacement    Following informed consent the patient was brought to the electrophysiology laboratory in place of the fluoroscopic table in the supine position after routine prep and drape lidocaine was infiltrated in the region of the previous incision and carried down to later the device pocket using sharp dissection and electrocautery. The pocket was opened the device was freed up and was explanted.  Interrogation of the previously implanted ventricular lead [employer]    demonstrated an R wave of 14.5  millivolts., and impedance of 400 ohms, and a pacing threshold of 0.7 volts at 0.5 msec.    The previously implanted atrial lead [employer]   demonstrated a P-wave amplitude of 4.5 milllivolts  and impedance of  374 ohms, and a pacing threshold of  volts at 0.5@ 0.5 milliseconds.  The leads were inspected. The leads were then attached to a Medtronic  pulse generator, serial number [id number].    The pocket was irrigated with antibiotic containing saline solution hemostasis was assured and the leads and the device were placed in the pocket. The wound was then closed in 2  layers in normal fashion. dERMAOBOND DRESSING WAS APPLIED  The patient tolerated the procedure without apparent complication.  Virl Axe

## 2013-11-03 NOTE — Interval H&P Note (Signed)
History and Physical Interval Note:  11/03/2013 3:29 PM  Adam [name]  has presented today for surgery, with the diagnosis of afib  The various methods of treatment have been discussed with the patient and family. After consideration of risks, benefits and other options for treatment, the patient has consented to  Procedure(s): PERMANENT PACEMAKER GENERATOR CHANGE (N/A) as a surgical intervention .  The patient's history has been reviewed, patient examined, no change in status, stable for surgery.  I have reviewed the patient's chart and labs.  Questions were answered to the patient's satisfaction.     Virl Axe

## 2013-11-03 NOTE — Discharge Instructions (Signed)
Pacemaker Battery Change, Care After  °Refer to this sheet in the next few weeks. These instructions provide you with information on caring for yourself after your procedure. Your health care provider may also give you more specific instructions. Your treatment has been planned according to current medical practices, but problems sometimes occur. Call your health care provider if you have any problems or questions after your procedure. °WHAT TO EXPECT AFTER THE PROCEDURE °After your procedure, it is typical to have the following sensations: °· Soreness at the pacemaker site. °HOME CARE INSTRUCTIONS  °· Keep the incision clean and dry. °· Unless advised otherwise, you may shower beginning 48 hours after your procedure. °· For the first week after the replacement, avoid stretching motions that pull at the incision site and avoid heavy exercise with the arm on the same side as the incision. °· Only take over-the-counter or prescription medicines for pain, discomfort, or fever as directed by your health care provider. °· Your health care provider will tell you when you will need to next test your pacemaker by telephone or when to return to the office for follow up for removal of stitches. °SEEK MEDICAL CARE IF:  °· You have pain at the incision site that is not relieved by over-the-counter or prescription medicine. °· There is drainage or pus from the incision site. °· There is swelling larger than a lime at the incision site. °· You develop red streaking that extends above or below the incision site. °· You feel brief, intermittent palpitations, lightheadedness, or any symptoms that you feel might be related to your heart. °SEEK IMMEDIATE MEDICAL CARE IF:  °· You experience chest pain that is different than the pain at the pacemaker site. °· Shortness of breath. °· Palpitations or irregular heart beat. °· Lightheadedness that does not go away quickly. °· Fainting. °· You have pain that gets worse and is not relieved by  medicine. °MAKE SURE YOU:  °· Understand these instructions. °· Will watch your condition. °· Will get help right away if you are not doing well or get worse. °Document Released: 05/24/2013 Document Reviewed: 02/15/2013 °[employer]® Patient Information ©2014 [employer]. ° °

## 2013-11-14 ENCOUNTER — Ambulatory Visit
Admission: RE | Admit: 2013-11-14 | Discharge: 2013-11-14 | Disposition: A | Discharge status: Home / Self Care | Payer: Medicare Other | Source: Ambulatory Visit | Attending: Internal Medicine | Admitting: Internal Medicine

## 2013-11-14 ENCOUNTER — Other Ambulatory Visit: Payer: Self-pay | Admitting: Internal Medicine

## 2013-11-14 DIAGNOSIS — R0602 Shortness of breath: Secondary | ICD-10-CM | POA: Diagnosis not present

## 2013-11-14 DIAGNOSIS — J45909 Unspecified asthma, uncomplicated: Secondary | ICD-10-CM

## 2013-11-15 ENCOUNTER — Ambulatory Visit (INDEPENDENT_AMBULATORY_CARE_PROVIDER_SITE_OTHER): Payer: Medicare Other | Admitting: *Deleted

## 2013-11-15 ENCOUNTER — Ambulatory Visit (INDEPENDENT_AMBULATORY_CARE_PROVIDER_SITE_OTHER): Payer: Medicare Other | Admitting: Pharmacist

## 2013-11-15 DIAGNOSIS — I4891 Unspecified atrial fibrillation: Secondary | ICD-10-CM | POA: Diagnosis not present

## 2013-11-15 DIAGNOSIS — I498 Other specified cardiac arrhythmias: Secondary | ICD-10-CM | POA: Diagnosis not present

## 2013-11-15 DIAGNOSIS — R001 Bradycardia, unspecified: Secondary | ICD-10-CM

## 2013-11-15 DIAGNOSIS — Z5181 Encounter for therapeutic drug level monitoring: Secondary | ICD-10-CM

## 2013-11-15 LAB — MDC_IDC_ENUM_SESS_TYPE_INCLINIC
Battery Impedance: 100 Ohm
Battery Remaining Longevity: 116 mo
Brady Statistic AP VP Percent: 50 %
Brady Statistic AP VS Percent: 0 %
Brady Statistic AS VP Percent: 28 %
Date Time Interrogation Session: 20150401094343
Lead Channel Impedance Value: 482 Ohm
Lead Channel Impedance Value: 501 Ohm
Lead Channel Pacing Threshold Amplitude: 0.75 V
Lead Channel Sensing Intrinsic Amplitude: 11.2 mV
Lead Channel Setting Pacing Amplitude: 2.5 V
Lead Channel Setting Pacing Pulse Width: 0.4 ms
Lead Channel Setting Sensing Sensitivity: 4 mV
MDC IDC MSMT BATTERY VOLTAGE: 2.79 V
MDC IDC MSMT LEADCHNL RA SENSING INTR AMPL: 2.8 mV
MDC IDC MSMT LEADCHNL RV PACING THRESHOLD PULSEWIDTH: 0.4 ms
MDC IDC SET LEADCHNL RA PACING AMPLITUDE: 2 V
MDC IDC STAT BRADY AS VS PERCENT: 21 %

## 2013-11-15 LAB — POCT INR: INR: 2.3

## 2013-11-15 NOTE — Progress Notes (Signed)
Wound check appointment.  Wound without redness or edema. Incision edges approximated, wound well healed. Normal device function. Thresholds, sensing, and impedances consistent with implant measurements. Device programmed at 3.5V/auto capture programmed on for extra safety margin until 3 month visit. Histogram distribution appropriate for patient and level of activity. 98 mode switches,87.2%, + couamdin.  No high ventricular rates noted. Patient educated about wound care, arm mobility, lifting restrictions. ROV in 3 months with implanting physician.

## 2013-11-16 ENCOUNTER — Telehealth: Payer: Self-pay | Admitting: Interventional Cardiology

## 2013-11-16 NOTE — Telephone Encounter (Signed)
returned pt call.pt wife sts that pt has had his pacemaker generator change on 11/03/13.pt wife sts that she was told at pt device ck yesterday that pt had been in afib since 3/22.pt has been getting over a virus (with no fever)the last 2-3 wks.she sts that pt blamed his fatigue on having a virus.pt concerned that Dr.Smith and Dr.Klein had  suggested that if pt was to go into afib he be dccv sooner instead of later.adv her that [place] and Dr.Klein are not in the office today.adv pt I will discuss with a physician in the office, fwd Dr.Smith a message and call back with Dr.Smith/other physician recommendation.pt agreeable with plan and verbalized understanding.

## 2013-11-16 NOTE — Telephone Encounter (Signed)
New Message  Pt wife called states that the Pt has had a pacemaker replaced on 3/20 w/ Dr.Klein.. Pt wife states that on 03/22 the patient has been out of rhythm. Pt requests a call back to discuss further. (Pt initially requested that this message goes to Dr. [name])

## 2013-11-16 NOTE — Telephone Encounter (Signed)
Patient has taken prednisone only sporadically, and wants to know if he needs to adjust warfarin dose.  INR was therapeutic yesterday.  He is coming in to see Dr. [name] in 5 days and possibly going to have cardioversion soon.  I will see patient same day next week.  Appointment made.

## 2013-11-16 NOTE — Telephone Encounter (Signed)
returned pt call.adv pt that I spoke with Dr.Skains another physician in the office.his suggestion is to not make any changes pt should f/u with Dr.Smith on 11/21/13 when he returns to the office.pt aware. pt adv to come in at 10:30am on 11/21/13 for an appt with Dr.Smith.

## 2013-11-16 NOTE — Telephone Encounter (Signed)
New message     For Adam [name] Pt is on prednisone prescribed by Dr Lavone Orn.  He is on coumadin.  He is scheduled to have a cardioversion soon---how will this new medication affect that?

## 2013-11-21 ENCOUNTER — Other Ambulatory Visit: Payer: Self-pay | Admitting: Interventional Cardiology

## 2013-11-21 ENCOUNTER — Encounter: Payer: Self-pay | Admitting: Interventional Cardiology

## 2013-11-21 ENCOUNTER — Ambulatory Visit (INDEPENDENT_AMBULATORY_CARE_PROVIDER_SITE_OTHER): Payer: Medicare Other | Admitting: Interventional Cardiology

## 2013-11-21 ENCOUNTER — Ambulatory Visit (INDEPENDENT_AMBULATORY_CARE_PROVIDER_SITE_OTHER): Payer: Medicare Other | Admitting: Pharmacist

## 2013-11-21 VITALS — BP 119/76 | HR 90 | Ht 68.0 in | Wt 159.1 lb

## 2013-11-21 DIAGNOSIS — Z5181 Encounter for therapeutic drug level monitoring: Secondary | ICD-10-CM

## 2013-11-21 DIAGNOSIS — I4891 Unspecified atrial fibrillation: Secondary | ICD-10-CM

## 2013-11-21 DIAGNOSIS — Z7901 Long term (current) use of anticoagulants: Secondary | ICD-10-CM | POA: Insufficient documentation

## 2013-11-21 DIAGNOSIS — I4892 Unspecified atrial flutter: Secondary | ICD-10-CM

## 2013-11-21 DIAGNOSIS — Z951 Presence of aortocoronary bypass graft: Secondary | ICD-10-CM | POA: Insufficient documentation

## 2013-11-21 DIAGNOSIS — Z950 Presence of cardiac pacemaker: Secondary | ICD-10-CM

## 2013-11-21 DIAGNOSIS — I25810 Atherosclerosis of coronary artery bypass graft(s) without angina pectoris: Secondary | ICD-10-CM | POA: Diagnosis not present

## 2013-11-21 LAB — BASIC METABOLIC PANEL
BUN: 23 mg/dL (ref 6–23)
CALCIUM: 9 mg/dL (ref 8.4–10.5)
CO2: 30 mEq/L (ref 19–32)
CREATININE: 0.9 mg/dL (ref 0.4–1.5)
Chloride: 96 mEq/L (ref 96–112)
GFR: 80.72 mL/min (ref >60.00)
Glucose, Bld: 89 mg/dL (ref 70–99)
Potassium: 4.4 mEq/L (ref 3.5–5.1)
Sodium: 135 mEq/L (ref 135–145)

## 2013-11-21 LAB — POCT INR: INR: 3.9

## 2013-11-21 LAB — MAGNESIUM: MAGNESIUM: 2.1 mg/dL (ref 1.5–2.5)

## 2013-11-21 NOTE — Patient Instructions (Signed)
Your physician recommends that you continue on your current medications as directed. Please refer to the Current Medication list given to you today.  Preprocedure Lab Today:Bmet, Magnesium  Your physician has recommended that you have a Cardioversion (DCCV). Electrical Cardioversion uses a jolt of electricity to your heart either through paddles or wired patches attached to your chest. This is a controlled, usually prescheduled, procedure. Defibrillation is done under light anesthesia in the hospital, and you usually go home the day of the procedure. This is done to get your heart back into a normal rhythm. You are not awake for the procedure. Please see the instruction sheet given to you today.

## 2013-11-21 NOTE — Progress Notes (Signed)
Patient ID: Adam [name], male   DOB: 04/19/1925, 80 y.o.   [name]: [id number]    [address], [address] [place], [place]  [address] Phone: [phone] Fax:  [phone]  Date:  11/21/2013   ID:  Adam [name], DOB 29-Dec-1924, [name] [id number]  PCP:  Horton Finer, MD   ASSESSMENT:  1. Recurrent atrial fibrillation/flutter since March 25 2. History of paroxysmal atrial fibrillation despite Tikosyn therapy 3. Coronary atherosclerotic heart disease status post bypass surgery 4. Chronic anticoagulation with Coumadin  PLAN:  1. Elective electrical cardioversion as soon as possible 2. Magnesium, Bmet, PT/INR today 3. Continue anticoagulation as prescribed 4. Description of electrical cardioversion the risks were discussed in detail 5. Continue Tikosyn   SUBJECTIVE: Adam [name] is a 80 y.o. male who has had an upper respiratory illness for nearly a month. He had pacemaker power source change out 10 days ago. On return to the device clinic it was noted that he had been out of rhythm since March 25. The device change out occurred on March 20. He now notes that he has been out of energy and somewhat short of breath on exertion which correlates with the dysrhythmia. He denies blood in his sure in the stool. No transient neurological complaints. No anginal quality chest pain. He denies chills or fever.   Wt Readings from Last 3 Encounters:  11/21/13 159 lb 1.9 oz (72.176 kg)  11/03/13 165 lb (74.844 kg)  11/03/13 165 lb (74.844 kg)     Past Medical History  Diagnosis Date  . S/P CABG (coronary artery bypass graft)   . Hyperlipidemia   . GERD (gastroesophageal reflux disease)   . Presence of permanent cardiac pacemaker   . Chronic anticoagulation   . LBP (low back pain)   . AAA (abdominal aortic aneurysm)   . Diverticulosis   . Arthritis   . CAD (coronary artery disease)     [name]  . PAF (paroxysmal atrial fibrillation)     Cardioversion June 2011 &  IN 2012  . CHF (congestive heart failure)     Current Outpatient Prescriptions  Medication Sig Dispense Refill  . acetaminophen (TYLENOL) 650 MG CR tablet Take 650 mg by mouth 2 (two) times daily.       [name] atorvastatin (LIPITOR) 10 MG tablet Take 10 mg by mouth daily.        [name] dofetilide (TIKOSYN) 125 MCG capsule Take 1 capsule (125 mcg total) by mouth 2 (two) times daily.  60 capsule  10  . furosemide (LASIX) 40 MG tablet Take 40 mg by mouth daily.      [name] latanoprost (XALATAN) 0.005 % ophthalmic solution Place 1 drop into both eyes at bedtime.       . metoprolol tartrate (LOPRESSOR) 25 MG tablet Take 1 tablet (25 mg total) by mouth 2 (two) times daily.  180 tablet  1  . Multiple Vitamins-Minerals (RA VISION-VITE PRESERVE PO) Take 1 tablet by mouth daily.       . nitroGLYCERIN (NITROSTAT) 0.4 MG SL tablet Place 0.4 mg under the tongue every 5 (five) minutes as needed. For chest pain      . Omega-3 Fatty Acids (FISH OIL) 1000 MG CPDR Take 1 capsule by mouth daily.       [name] omeprazole (PRILOSEC OTC) 20 MG tablet Take 20 mg by mouth daily as needed (acid reflux).       . phytonadione (MEPHYTON) 5 MG tablet Take 1 tablet (  5 mg total) by mouth once. Today 11/02/13  1 tablet  0  . silodosin (RAPAFLO) 4 MG CAPS capsule Take 4 mg by mouth daily.       [name] VITAMIN D, CHOLECALCIFEROL, PO Take 2,000 Units by mouth daily.       [name] warfarin (COUMADIN) 5 MG tablet Take as directed by anticoagulation clinic  90 tablet  1   No current facility-administered medications for this visit.    Allergies:    Allergies  Allergen Reactions  . Morphine Nausea Only    Social History:  The patient  reports that he quit smoking about 65 years ago. His smoking use included Cigarettes. He smoked 0.00 packs per day for 3 years. He has never used smokeless tobacco. He reports that he does not drink alcohol or use illicit drugs.   ROS:  Please see the history of present illness.   Appetite is stable. No melena. No  neurological complaints. He denies orthopnea. No peripheral edema.   All other systems reviewed and negative.   OBJECTIVE: VS:  BP 119/76  Pulse 90  Ht 5\' 8"  (1.727 m)  Wt 159 lb 1.9 oz (72.176 kg)  BMI 24.20 kg/m2 Well nourished, well developed, in no acute distress, elderly HEENT: normal Neck: JVD flat. Carotid bruit absent  Cardiac:  normal S1, S2; RRR; no murmur Lungs:  clear to auscultation bilaterally, no wheezing, rhonchi or rales Abd: soft, nontender, no hepatomegaly Ext: Edema  Absent . Pulses 2+  Skin: warm and dry Neuro:  CNs 2-12 intact, no focal abnormalities noted  EKG:  Ventricular pacing and probable atrial flutter.       Signed, Illene Labrador III, MD 11/21/2013 11:24 AM

## 2013-11-23 ENCOUNTER — Encounter (HOSPITAL_COMMUNITY): Payer: Medicare Other | Admitting: Anesthesiology

## 2013-11-23 ENCOUNTER — Ambulatory Visit (HOSPITAL_COMMUNITY): Payer: Medicare Other | Admitting: Anesthesiology

## 2013-11-23 ENCOUNTER — Ambulatory Visit (HOSPITAL_COMMUNITY)
Admission: RE | Admit: 2013-11-23 | Discharge: 2013-11-23 | Disposition: A | Discharge status: Home / Self Care | Payer: Medicare Other | Source: Ambulatory Visit | Attending: Interventional Cardiology | Admitting: Interventional Cardiology

## 2013-11-23 ENCOUNTER — Encounter (HOSPITAL_COMMUNITY): Payer: Self-pay | Admitting: *Deleted

## 2013-11-23 ENCOUNTER — Encounter (HOSPITAL_COMMUNITY)
Admission: RE | Disposition: A | Discharge status: Home / Self Care | Payer: Self-pay | Source: Ambulatory Visit | Attending: Interventional Cardiology

## 2013-11-23 DIAGNOSIS — Z7901 Long term (current) use of anticoagulants: Secondary | ICD-10-CM | POA: Insufficient documentation

## 2013-11-23 DIAGNOSIS — I714 Abdominal aortic aneurysm, without rupture, unspecified: Secondary | ICD-10-CM | POA: Diagnosis not present

## 2013-11-23 DIAGNOSIS — I2510 Atherosclerotic heart disease of native coronary artery without angina pectoris: Secondary | ICD-10-CM | POA: Insufficient documentation

## 2013-11-23 DIAGNOSIS — I509 Heart failure, unspecified: Secondary | ICD-10-CM | POA: Insufficient documentation

## 2013-11-23 DIAGNOSIS — K5730 Diverticulosis of large intestine without perforation or abscess without bleeding: Secondary | ICD-10-CM | POA: Diagnosis not present

## 2013-11-23 DIAGNOSIS — Z951 Presence of aortocoronary bypass graft: Secondary | ICD-10-CM | POA: Diagnosis not present

## 2013-11-23 DIAGNOSIS — K219 Gastro-esophageal reflux disease without esophagitis: Secondary | ICD-10-CM | POA: Diagnosis not present

## 2013-11-23 DIAGNOSIS — Z950 Presence of cardiac pacemaker: Secondary | ICD-10-CM | POA: Diagnosis not present

## 2013-11-23 DIAGNOSIS — I4891 Unspecified atrial fibrillation: Secondary | ICD-10-CM | POA: Insufficient documentation

## 2013-11-23 DIAGNOSIS — E785 Hyperlipidemia, unspecified: Secondary | ICD-10-CM | POA: Insufficient documentation

## 2013-11-23 DIAGNOSIS — I4892 Unspecified atrial flutter: Secondary | ICD-10-CM

## 2013-11-23 DIAGNOSIS — Z87891 Personal history of nicotine dependence: Secondary | ICD-10-CM | POA: Insufficient documentation

## 2013-11-23 HISTORY — PX: CARDIOVERSION: SHX1299

## 2013-11-23 SURGERY — CARDIOVERSION
Anesthesia: Monitor Anesthesia Care

## 2013-11-23 MED ORDER — SODIUM CHLORIDE 0.9 % IV SOLN
Freq: Once | INTRAVENOUS | Status: AC
  Administered 2013-11-23: 500 mL via INTRAVENOUS

## 2013-11-23 MED ORDER — SODIUM CHLORIDE 0.9 % IJ SOLN: 3.0000 mL | Freq: Two times a day (BID) | INTRAMUSCULAR | Status: DC

## 2013-11-23 MED ORDER — SODIUM CHLORIDE 0.9 % IJ SOLN: 3.0000 mL | INTRAMUSCULAR | Status: DC | PRN

## 2013-11-23 MED ORDER — SODIUM CHLORIDE 0.9 % IV SOLN
INTRAVENOUS | Status: DC | PRN
  Administered 2013-11-23: 13:00:00 via INTRAVENOUS

## 2013-11-23 MED ORDER — PROPOFOL 10 MG/ML IV BOLUS
INTRAVENOUS | Status: DC | PRN
  Administered 2013-11-23: 90 mg via INTRAVENOUS

## 2013-11-23 MED ORDER — SODIUM CHLORIDE 0.9 % IV SOLN: 250.0000 mL | INTRAVENOUS | Status: DC

## 2013-11-23 NOTE — Anesthesia Postprocedure Evaluation (Signed)
  Anesthesia Post-op Note  Patient: Adam [name]  Procedure(s) Performed: Procedure(s): CARDIOVERSION (N/A)  Patient Location: PACU  Anesthesia Type:MAC  Level of Consciousness: awake, alert  and oriented  Airway and Oxygen Therapy: Patient Spontanous Breathing  Post-op Pain: none  Post-op Assessment: Post-op Vital signs reviewed, Patient's Cardiovascular Status Stable, Respiratory Function Stable, Patent Airway, No signs of Nausea or vomiting and Pain level controlled  Post-op Vital Signs: Reviewed and stable  Last Vitals:  Filed Vitals:   11/23/13 1347  BP:   Pulse: 70  Temp:   Resp: 16    Complications: No apparent anesthesia complications

## 2013-11-23 NOTE — Transfer of Care (Signed)
Immediate Anesthesia Transfer of Care Note  Patient: Adam [name]  Procedure(s) Performed: Procedure(s): CARDIOVERSION (N/A)  Patient Location: PACU and Endoscopy Unit  Anesthesia Type:MAC  Level of Consciousness: awake  Airway & Oxygen Therapy: Patient Spontanous Breathing and Patient connected to nasal cannula oxygen  Post-op Assessment: Report given to PACU RN and Post -op Vital signs reviewed and stable  Post vital signs: Reviewed and stable  Complications: No apparent anesthesia complications

## 2013-11-23 NOTE — Interval H&P Note (Signed)
History and Physical Interval Note:  Still feels weak and tired as he did prior to previous cardioversions. ECG c/w atrial flutter. 11/23/2013 12:42 PM  [name]  has presented today for surgery, with the diagnosis of A FIB  The various methods of treatment have been discussed with the patient and family. After consideration of risks, benefits and other options for treatment, the patient has consented to  Procedure(s): CARDIOVERSION (N/A) as a surgical intervention .  The patient's history has been reviewed, patient examined, no change in status, stable for surgery.  I have reviewed the patient's chart and labs.  Questions were answered to the patient's satisfaction.     Adam [name]

## 2013-11-23 NOTE — Anesthesia Preprocedure Evaluation (Signed)
Anesthesia Evaluation  Patient identified by [name], date of birth, ID band Patient awake    Reviewed: Allergy & Precautions, H&P , NPO status , Patient's Chart, lab work & pertinent test results  History of Anesthesia Complications Negative for: history of anesthetic complications  Airway Mallampati: II TM Distance: >3 FB Neck ROM: Full    Dental  (+) Dental Advisory Given   Pulmonary former smoker,  URI- off prednisone for about 1 week per pt         Cardiovascular + CAD, + Peripheral Vascular Disease and +CHF     Neuro/Psych negative neurological ROS     GI/Hepatic Neg liver ROS, GERD-  ,  Endo/Other  negative endocrine ROS  Renal/GU negative Renal ROS  negative genitourinary   Musculoskeletal   Abdominal   Peds  Hematology On coudamin   Anesthesia Other Findings   Reproductive/Obstetrics                           Anesthesia Physical Anesthesia Plan  ASA: III  Anesthesia Plan: MAC   Post-op Pain Management:    Induction:   Airway Management Planned: Mask  Additional Equipment:   Intra-op Plan:   Post-operative Plan:   Informed Consent:   Dental advisory given  Plan Discussed with: CRNA, Surgeon and Anesthesiologist  Anesthesia Plan Comments:         Anesthesia Quick Evaluation

## 2013-11-23 NOTE — CV Procedure (Signed)
Electrical Cardioversion Procedure Note DAVARIS YOUTSEY [id number] 05-11-1925  Procedure: Electrical Cardioversion Indications:  Atrial Flutter  Time Out: Verified patient identification, verified procedure,medications/allergies/relevent history reviewed, required imaging and test results available.  Performed  Procedure Details  The patient was NPO after midnight. Anesthesia was administered at the beside  by Dr. Royce Macadamia  with 90 mg of propofol.  Cardioversion was done with three synchronized biphasic discharges with AP pads with 200, 120, and 200 watts respectively.  The patient converted to normal sinus rhythm. The patient tolerated the procedure well   IMPRESSION:  Successful cardioversion of atrial flutter to NSR/AV sequential pacing.    [name] III 11/23/2013, 1:17 PM

## 2013-11-23 NOTE — Discharge Instructions (Signed)
Electrical Cardioversion, Care After Refer to this sheet in the next few weeks. These instructions provide you with information on caring for yourself after your procedure. Your health care provider may also give you more specific instructions. Your treatment has been planned according to current medical practices, but problems sometimes occur. Call your health care provider if you have any problems or questions after your procedure. WHAT TO EXPECT AFTER THE PROCEDURE After your procedure, it is typical to have the following sensations:  Some redness on the skin where the shocks were delivered. If this is tender, a sunburn lotion or hydrocortisone cream may help.  Possible return of an abnormal heart rhythm within hours or days after the procedure. HOME CARE INSTRUCTIONS  Only take medicine as directed by your health care provider. Be sure you understand how and when to take your medicine.  Learn how to feel your pulse and check it often.  Limit your activity for 48 hours after the procedure or as directed.  Avoid or minimize caffeine and other stimulants as directed. SEEK MEDICAL CARE IF:  You feel like your heart is beating too fast or your pulse is not regular.  You have any questions about your medicines.  You have bleeding that will not stop. SEEK IMMEDIATE MEDICAL CARE IF:  You are dizzy or feel faint.  It is hard to breathe or you feel short of breath.  There is a change in discomfort in your chest.  Your speech is slurred or you have trouble moving an arm or leg on one side of your body.  You get a serious muscle cramp that does not go away.  Your fingers or toes turn cold or blue. MAKE SURE YOU:   Understand these instructions.   Will watch your condition.   Will get help right away if you are not doing well or get worse. Document Released: 05/24/2013 Document Reviewed: 02/15/2013 [facility] Patient Information 2014 [place], [place].

## 2013-11-23 NOTE — Anesthesia Postprocedure Evaluation (Signed)
  Anesthesia Post-op Note  Patient: Adam [name]  Procedure(s) Performed: Procedure(s): CARDIOVERSION (N/A)  Patient Location: PACU and Endoscopy Unit  Anesthesia Type:MAC  Level of Consciousness: awake and alert   Airway and Oxygen Therapy: Patient Spontanous Breathing and Patient connected to nasal cannula oxygen  Post-op Pain: none  Post-op Assessment: Post-op Vital signs reviewed and Patient's Cardiovascular Status Stable  Post-op Vital Signs: Reviewed and stable  Last Vitals:  Filed Vitals:   11/23/13 1122  BP: 139/72  Temp: 36.4 C  Resp: 21    Complications: No apparent anesthesia complications

## 2013-11-23 NOTE — H&P (View-Only) (Signed)
Patient ID: Adam [name], male   DOB: 02/27/1925, 80 y.o.   [name]: [id number]    [address], [address] [place], [place]  [address] Phone: [phone] Fax:  [phone]  Date:  11/21/2013   ID:  Adam [name], DOB 11-27-1924, [name] [id number]  PCP:  Horton Finer, MD   ASSESSMENT:  1. Recurrent atrial fibrillation/flutter since March 25 2. History of paroxysmal atrial fibrillation despite Tikosyn therapy 3. Coronary atherosclerotic heart disease status post bypass surgery 4. Chronic anticoagulation with Coumadin  PLAN:  1. Elective electrical cardioversion as soon as possible 2. Magnesium, Bmet, PT/INR today 3. Continue anticoagulation as prescribed 4. Description of electrical cardioversion the risks were discussed in detail 5. Continue Tikosyn   SUBJECTIVE: Adam [name] is a 80 y.o. male who has had an upper respiratory illness for nearly a month. He had pacemaker power source change out 10 days ago. On return to the device clinic it was noted that he had been out of rhythm since March 25. The device change out occurred on March 20. He now notes that he has been out of energy and somewhat short of breath on exertion which correlates with the dysrhythmia. He denies blood in his sure in the stool. No transient neurological complaints. No anginal quality chest pain. He denies chills or fever.   Wt Readings from Last 3 Encounters:  11/21/13 159 lb 1.9 oz (72.176 kg)  11/03/13 165 lb (74.844 kg)  11/03/13 165 lb (74.844 kg)     Past Medical History  Diagnosis Date  . S/P CABG (coronary artery bypass graft)   . Hyperlipidemia   . GERD (gastroesophageal reflux disease)   . Presence of permanent cardiac pacemaker   . Chronic anticoagulation   . LBP (low back pain)   . AAA (abdominal aortic aneurysm)   . Diverticulosis   . Arthritis   . CAD (coronary artery disease)     HANK Brysun Eschmann IS CARDIO  . PAF (paroxysmal atrial fibrillation)     Cardioversion June 2011 &  IN 2012  . CHF (congestive heart failure)     Current Outpatient Prescriptions  Medication Sig Dispense Refill  . acetaminophen (TYLENOL) 650 MG CR tablet Take 650 mg by mouth 2 (two) times daily.       [name] atorvastatin (LIPITOR) 10 MG tablet Take 10 mg by mouth daily.        [name] dofetilide (TIKOSYN) 125 MCG capsule Take 1 capsule (125 mcg total) by mouth 2 (two) times daily.  60 capsule  10  . furosemide (LASIX) 40 MG tablet Take 40 mg by mouth daily.      [name] latanoprost (XALATAN) 0.005 % ophthalmic solution Place 1 drop into both eyes at bedtime.       . metoprolol tartrate (LOPRESSOR) 25 MG tablet Take 1 tablet (25 mg total) by mouth 2 (two) times daily.  180 tablet  1  . Multiple Vitamins-Minerals (RA VISION-VITE PRESERVE PO) Take 1 tablet by mouth daily.       . nitroGLYCERIN (NITROSTAT) 0.4 MG SL tablet Place 0.4 mg under the tongue every 5 (five) minutes as needed. For chest pain      . Omega-3 Fatty Acids (FISH OIL) 1000 MG CPDR Take 1 capsule by mouth daily.       [name] omeprazole (PRILOSEC OTC) 20 MG tablet Take 20 mg by mouth daily as needed (acid reflux).       . phytonadione (MEPHYTON) 5 MG tablet Take 1 tablet (  5 mg total) by mouth once. Today 11/02/13  1 tablet  0  . silodosin (RAPAFLO) 4 MG CAPS capsule Take 4 mg by mouth daily.       [name] VITAMIN D, CHOLECALCIFEROL, PO Take 2,000 Units by mouth daily.       [name] warfarin (COUMADIN) 5 MG tablet Take as directed by anticoagulation clinic  90 tablet  1   No current facility-administered medications for this visit.    Allergies:    Allergies  Allergen Reactions  . Morphine Nausea Only    Social History:  The patient  reports that he quit smoking about 65 years ago. His smoking use included Cigarettes. He smoked 0.00 packs per day for 3 years. He has never used smokeless tobacco. He reports that he does not drink alcohol or use illicit drugs.   ROS:  Please see the history of present illness.   Appetite is stable. No melena. No  neurological complaints. He denies orthopnea. No peripheral edema.   All other systems reviewed and negative.   OBJECTIVE: VS:  BP 119/76  Pulse 90  Ht 5\' 8"  (1.727 m)  Wt 159 lb 1.9 oz (72.176 kg)  BMI 24.20 kg/m2 Well nourished, well developed, in no acute distress, elderly HEENT: normal Neck: JVD flat. Carotid bruit absent  Cardiac:  normal S1, S2; RRR; no murmur Lungs:  clear to auscultation bilaterally, no wheezing, rhonchi or rales Abd: soft, nontender, no hepatomegaly Ext: Edema  Absent . Pulses 2+  Skin: warm and dry Neuro:  CNs 2-12 intact, no focal abnormalities noted  EKG:  Ventricular pacing and probable atrial flutter.       Signed, Illene Labrador III, MD 11/21/2013 11:24 AM

## 2013-11-24 ENCOUNTER — Encounter (HOSPITAL_COMMUNITY): Payer: Self-pay | Admitting: Interventional Cardiology

## 2013-11-28 ENCOUNTER — Ambulatory Visit (INDEPENDENT_AMBULATORY_CARE_PROVIDER_SITE_OTHER): Payer: Medicare Other | Admitting: Pharmacist

## 2013-11-28 ENCOUNTER — Encounter: Payer: Self-pay | Admitting: Interventional Cardiology

## 2013-11-28 ENCOUNTER — Ambulatory Visit (INDEPENDENT_AMBULATORY_CARE_PROVIDER_SITE_OTHER): Payer: Medicare Other | Admitting: Interventional Cardiology

## 2013-11-28 VITALS — BP 112/68 | HR 80 | Ht 68.0 in | Wt 158.0 lb

## 2013-11-28 DIAGNOSIS — Z5181 Encounter for therapeutic drug level monitoring: Secondary | ICD-10-CM

## 2013-11-28 DIAGNOSIS — I25810 Atherosclerosis of coronary artery bypass graft(s) without angina pectoris: Secondary | ICD-10-CM | POA: Diagnosis not present

## 2013-11-28 DIAGNOSIS — I4891 Unspecified atrial fibrillation: Secondary | ICD-10-CM | POA: Diagnosis not present

## 2013-11-28 DIAGNOSIS — Z950 Presence of cardiac pacemaker: Secondary | ICD-10-CM

## 2013-11-28 DIAGNOSIS — Z7901 Long term (current) use of anticoagulants: Secondary | ICD-10-CM | POA: Diagnosis not present

## 2013-11-28 LAB — POCT INR: INR: 2.8

## 2013-11-28 NOTE — Patient Instructions (Signed)
Your physician recommends that you continue on your current medications as directed. Please refer to the Current Medication list given to you today.  Your physician wants you to follow-up in: 6 months. You will receive a reminder letter in the mail two months in advance. If you don't receive a letter, please call our office to schedule the follow-up appointment.  

## 2013-11-28 NOTE — Progress Notes (Signed)
Patient ID: Adam [name], male   DOB: 1925-04-18, 80 y.o.   [name]: [id number]    [address] [place], [place]  [address] Phone: [phone] Fax:  [phone]  Date:  11/28/2013   ID:  [name], DOB [birth year], [name] [id number]  PCP:  Horton Finer, MD   ASSESSMENT:  1. Paroxysmal atrial fibrillation, maintaining sinus rhythm post cardioversion 5 days ago 2. Tikosyn therapy without complications 3. Anticoagulation therapy without bleeding 3 pericardial disease, asymptomatic  PLAN:  1. Continue current therapeutic regimen 2. Followup in 6 months. Call with decreased energy or palpitations suggest recurrent atrial fibrillation 3. I will query Dr. [name] about whether any changes in his regimen are indicated   SUBJECTIVE: Adam [name] is a 80 y.o. male who has paroxysmal A. fib. He redeveloped atrial fibrillation approximately 2 weeks ago and on Thursday underwent electrical cardioversion successfully. 3 discharges were necessary to achieve sinus rhythm that was sustained. On one occasion he converted to sinus rhythm but quickly degenerated back into atrial arrhythmia. He is done well since outpatient procedure. His strength and energy are back to baseline. No apparent complications. No bleeding on his anticoagulation regimen.   Wt Readings from Last 3 Encounters:  11/28/13 158 lb (71.668 kg)  11/21/13 159 lb 1.9 oz (72.176 kg)  11/03/13 165 lb (74.844 kg)     Past Medical History  Diagnosis Date  . S/P CABG (coronary artery bypass graft)   . Hyperlipidemia   . GERD (gastroesophageal reflux disease)   . Presence of permanent cardiac pacemaker   . Chronic anticoagulation   . LBP (low back pain)   . AAA (abdominal aortic aneurysm)   . Diverticulosis   . Arthritis   . 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 PAF (paroxysmal atrial fibrillation)     Cardioversion June 2011 & IN 2012  . CHF (congestive heart failure)      Current Outpatient Prescriptions  Medication Sig Dispense Refill  . acetaminophen (TYLENOL) 650 MG CR tablet Take 650 mg by mouth 2 (two) times daily.       [name] atorvastatin (LIPITOR) 10 MG tablet Take 10 mg by mouth daily.        [name] dofetilide (TIKOSYN) 125 MCG capsule Take 1 capsule (125 mcg total) by mouth 2 (two) times daily.  60 capsule  10  . furosemide (LASIX) 40 MG tablet Take 40 mg by mouth daily.      [name] latanoprost (XALATAN) 0.005 % ophthalmic solution Place 1 drop into both eyes at bedtime.       . metoprolol tartrate (LOPRESSOR) 25 MG tablet Take 1 tablet (25 mg total) by mouth 2 (two) times daily.  180 tablet  1  . Multiple Vitamins-Minerals (RA VISION-VITE PRESERVE PO) Take 1 tablet by mouth daily.       . nitroGLYCERIN (NITROSTAT) 0.4 MG SL tablet Place 0.4 mg under the tongue every 5 (five) minutes as needed. For chest pain      . Omega-3 Fatty Acids (FISH OIL) 1000 MG CPDR Take 1 capsule by mouth daily.       [name] omeprazole (PRILOSEC OTC) 20 MG tablet Take 20 mg by mouth daily as needed (acid reflux).       . phytonadione (MEPHYTON) 5 MG tablet Take 1 tablet (5 mg total) by mouth once. Today 11/02/13  1 tablet  0  . silodosin (RAPAFLO) 4 MG CAPS capsule Take 4 mg  by mouth daily.       [name] VITAMIN D, CHOLECALCIFEROL, PO Take 2,000 Units by mouth daily.       [name] warfarin (COUMADIN) 5 MG tablet Take as directed by anticoagulation clinic  90 tablet  1   No current facility-administered medications for this visit.    Allergies:    Allergies  Allergen Reactions  . Morphine Nausea Only    Social History:  The patient  reports that he quit smoking about 65 years ago. His smoking use included Cigarettes. He smoked 0.00 packs per day for 3 years. He has never used smokeless tobacco. He reports that he does not drink alcohol or use illicit drugs.   ROS:  Please see the history of present illness.   Stable appetite, no neurological symptoms, no syncope, denies edema, and no chest pain    All other systems reviewed and negative.   OBJECTIVE: VS:  BP 112/68  Pulse 80  Ht 5\' 8"  (1.727 m)  Wt 158 lb (71.668 kg)  BMI 24.03 kg/m2 Well nourished, well developed, in no acute distress, elderly HEENT: normal Neck: JVD flat. Carotid bruit absent  Cardiac:  normal S1, S2; RRR; no murmur Lungs:  clear to auscultation bilaterally, no wheezing, rhonchi or rales Abd: soft, nontender, no hepatomegaly Ext: Edema absent. Pulses 2+ and symmetric Skin: warm and dry Neuro:  CNs 2-12 intact, no focal abnormalities noted  EKG:  AV sequential pacing       Signed, Illene Labrador III, MD 11/28/2013 9:19 AM  \

## 2013-12-01 ENCOUNTER — Encounter: Payer: Self-pay | Admitting: Internal Medicine

## 2013-12-06 ENCOUNTER — Other Ambulatory Visit: Payer: Self-pay | Admitting: Internal Medicine

## 2013-12-12 ENCOUNTER — Ambulatory Visit (INDEPENDENT_AMBULATORY_CARE_PROVIDER_SITE_OTHER): Payer: Medicare Other | Admitting: Pharmacist

## 2013-12-12 DIAGNOSIS — Z5181 Encounter for therapeutic drug level monitoring: Secondary | ICD-10-CM

## 2013-12-12 DIAGNOSIS — I4891 Unspecified atrial fibrillation: Secondary | ICD-10-CM

## 2013-12-12 LAB — POCT INR: INR: 2.7

## 2013-12-26 DIAGNOSIS — N400 Enlarged prostate without lower urinary tract symptoms: Secondary | ICD-10-CM | POA: Diagnosis not present

## 2014-01-04 DIAGNOSIS — H35369 Drusen (degenerative) of macula, unspecified eye: Secondary | ICD-10-CM | POA: Diagnosis not present

## 2014-01-04 DIAGNOSIS — H02839 Dermatochalasis of unspecified eye, unspecified eyelid: Secondary | ICD-10-CM | POA: Diagnosis not present

## 2014-01-04 DIAGNOSIS — H2640 Unspecified secondary cataract: Secondary | ICD-10-CM | POA: Diagnosis not present

## 2014-01-04 DIAGNOSIS — H43819 Vitreous degeneration, unspecified eye: Secondary | ICD-10-CM | POA: Diagnosis not present

## 2014-01-04 DIAGNOSIS — Z961 Presence of intraocular lens: Secondary | ICD-10-CM | POA: Diagnosis not present

## 2014-01-04 DIAGNOSIS — H353190 Nonexudative age-related macular degeneration, unspecified eye, stage unspecified: Secondary | ICD-10-CM | POA: Diagnosis not present

## 2014-01-04 DIAGNOSIS — H40059 Ocular hypertension, unspecified eye: Secondary | ICD-10-CM | POA: Diagnosis not present

## 2014-01-10 ENCOUNTER — Ambulatory Visit (INDEPENDENT_AMBULATORY_CARE_PROVIDER_SITE_OTHER): Payer: Medicare Other | Admitting: Pharmacist

## 2014-01-10 ENCOUNTER — Telehealth: Payer: Self-pay | Admitting: Pharmacist

## 2014-01-10 DIAGNOSIS — I4891 Unspecified atrial fibrillation: Secondary | ICD-10-CM

## 2014-01-10 DIAGNOSIS — Z5181 Encounter for therapeutic drug level monitoring: Secondary | ICD-10-CM | POA: Diagnosis not present

## 2014-01-10 DIAGNOSIS — Z23 Encounter for immunization: Secondary | ICD-10-CM | POA: Diagnosis not present

## 2014-01-10 LAB — POCT INR: INR: 2.5

## 2014-01-10 NOTE — Telephone Encounter (Signed)
Patient gets a BMET every 3 months for Tikosyn use.  Scheduled for 02/2014 as last BMET was 11/2013.

## 2014-02-05 ENCOUNTER — Telehealth: Payer: Self-pay | Admitting: Interventional Cardiology

## 2014-02-05 NOTE — Telephone Encounter (Signed)
New message      Pt needs to have dental surgery (remove a tooth and trim part of the root).  Need instructions to stop coumadin. Will [name] prefer an oral surgeon instead of a dentist because of bleeding?

## 2014-02-05 NOTE — Telephone Encounter (Signed)
Faxed clearance to stop Coumadin 5 days prior to procedure

## 2014-02-08 ENCOUNTER — Encounter: Payer: Self-pay | Admitting: Internal Medicine

## 2014-02-08 ENCOUNTER — Ambulatory Visit (INDEPENDENT_AMBULATORY_CARE_PROVIDER_SITE_OTHER): Payer: Medicare Other | Admitting: Internal Medicine

## 2014-02-08 VITALS — BP 122/71 | HR 81 | Ht 68.0 in | Wt 164.0 lb

## 2014-02-08 DIAGNOSIS — I498 Other specified cardiac arrhythmias: Secondary | ICD-10-CM | POA: Diagnosis not present

## 2014-02-08 DIAGNOSIS — I25810 Atherosclerosis of coronary artery bypass graft(s) without angina pectoris: Secondary | ICD-10-CM

## 2014-02-08 DIAGNOSIS — Z950 Presence of cardiac pacemaker: Secondary | ICD-10-CM | POA: Diagnosis not present

## 2014-02-08 DIAGNOSIS — I4891 Unspecified atrial fibrillation: Secondary | ICD-10-CM | POA: Diagnosis not present

## 2014-02-08 DIAGNOSIS — I480 Paroxysmal atrial fibrillation: Secondary | ICD-10-CM

## 2014-02-08 DIAGNOSIS — R001 Bradycardia, unspecified: Secondary | ICD-10-CM

## 2014-02-08 LAB — MDC_IDC_ENUM_SESS_TYPE_INCLINIC
Battery Impedance: 100 Ohm
Battery Remaining Longevity: 112 mo
Battery Voltage: 2.8 V
Brady Statistic AP VP Percent: 71 %
Brady Statistic AS VP Percent: 26 %
Brady Statistic AS VS Percent: 3 %
Date Time Interrogation Session: 20150625122109
Lead Channel Impedance Value: 469 Ohm
Lead Channel Impedance Value: 519 Ohm
Lead Channel Sensing Intrinsic Amplitude: 11.2 mV
Lead Channel Setting Pacing Amplitude: 2 V
Lead Channel Setting Pacing Amplitude: 2.5 V
Lead Channel Setting Pacing Pulse Width: 0.4 ms
MDC IDC MSMT LEADCHNL RA SENSING INTR AMPL: 2.8 mV
MDC IDC MSMT LEADCHNL RV PACING THRESHOLD AMPLITUDE: 0.75 V
MDC IDC MSMT LEADCHNL RV PACING THRESHOLD PULSEWIDTH: 0.4 ms
MDC IDC SET LEADCHNL RV SENSING SENSITIVITY: 4 mV
MDC IDC STAT BRADY AP VS PERCENT: 0 %

## 2014-02-08 MED ORDER — FUROSEMIDE 40 MG PO TABS: ORAL_TABLET | ORAL | Status: DC

## 2014-02-08 NOTE — Patient Instructions (Addendum)
Your physician recommends that you schedule a follow-up appointment as scheduled with [name]  Your physician wants you to follow-up in: 9 months with Dr Gari Crown will receive a reminder letter in the mail two months in advance. If you don't receive a letter, please call our office to schedule the follow-up appointment.  Remote monitoring is used to monitor your Pacemaker or ICD from home. This monitoring reduces the number of office visits required to check your device to one time per year. It allows [place] to keep an eye on the functioning of your device to ensure it is working properly. You are scheduled for a device check from home on 05/10/14. You may send your transmission at any time that day. If you have a wireless device, the transmission will be sent automatically. After your physician reviews your transmission, you will receive a postcard with your next transmission date.  Your physician has recommended you make the following change in your medication:  1) Increase Furosemide to twice daily for 5 days then back to 40mg  daily  Your physician has requested that you have an echocardiogram. Echocardiography is a painless test that uses sound waves to create images of your heart. It provides your doctor with information about the size and shape of your heart and how well your heart's chambers and valves are working. This procedure takes approximately one hour. There are no restrictions for this procedure.

## 2014-02-08 NOTE — Progress Notes (Signed)
Patient Care Team: Horton Finer, MD as PCP - General (Internal Medicine) [name], MD (Cardiology) Sinclair Grooms, MD (Cardiology)   HPI  Adam [name] is a 80 y.o. 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  Recurrent atrial fibrillation in late March and underwent repeat cardioversion.   he unfortunately reverted to atrial fibrillation. This is associated with worsening functional status. He also was noted to have significant peripheral edema.  Last assessment of left ventricular function is 2008 LV function was normal.        Past Medical History  Diagnosis Date  . S/P CABG (coronary artery bypass graft)   . Hyperlipidemia   . GERD (gastroesophageal reflux disease)   . Presence of permanent cardiac pacemaker   . Chronic anticoagulation   . LBP (low back pain)   . AAA (abdominal aortic aneurysm)   . Diverticulosis   . Arthritis   . CAD (coronary artery disease)     [name]  . PAF (paroxysmal atrial fibrillation)     Cardioversion June 2011 & IN 2012  . CHF (congestive heart failure)     Past Surgical History  Procedure Laterality Date  . Coronary artery bypass graft  Feb 1989  . Permanent pacemaker  July 2008  . Tonsillectomy    . Cataract extraction    . Joint replacement  1995 & 1996    Bilateral total knee replacements  . Endovascular stent insertion  07/28/2011    Procedure: ENDOVASCULAR STENT GRAFT INSERTION;  Surgeon: Hinda Lenis, MD;  Location: [facility];  Service: Vascular;  Laterality: N/A;  Pt. on Coumadin/ instructed to hold 5 days prior to procedure  . Cardioversion  06/30/2012    Procedure: CARDIOVERSION;  Surgeon: Sinclair Grooms, MD;  Location: [facility];   Service: Cardiovascular;  Laterality: N/A;  . Cardioversion N/A 11/23/2013    Procedure: CARDIOVERSION;  Surgeon: Sinclair Grooms, MD;  Location: [facility] ENDOSCOPY;  Service: Cardiovascular;  Laterality: N/A;    Current Outpatient Prescriptions  Medication Sig Dispense Refill  . acetaminophen (TYLENOL) 650 MG CR tablet Take 650 mg by mouth 2 (two) times daily.       [name] atorvastatin (LIPITOR) 10 MG tablet Take 10 mg by mouth daily.        [name] dofetilide (TIKOSYN) 125 MCG capsule Take 1 capsule (125 mcg total) by mouth 2 (two) times daily.  60 capsule  10  . furosemide (LASIX) 40 MG tablet Take 40 mg by mouth daily.      [name] latanoprost (XALATAN) 0.005 % ophthalmic solution Place 1 drop into both eyes at bedtime.       . metoprolol tartrate (LOPRESSOR) 25 MG tablet Take 1 tablet (25 mg total) by mouth 2 (two) times daily.  180 tablet  1  . metoprolol tartrate (LOPRESSOR) 25 MG tablet TAKE 1 TABLET BY MOUTH TWICE DAILY WITH FOOD  180 tablet  0  . Multiple Vitamins-Minerals (RA VISION-VITE PRESERVE PO) Take 1 tablet by mouth daily.       . nitroGLYCERIN (NITROSTAT) 0.4 MG SL tablet Place 0.4 mg under the tongue every 5 (five) minutes as needed. For chest pain      .  Omega-3 Fatty Acids (FISH OIL) 1000 MG CPDR Take 1 capsule by mouth daily.       [name] omeprazole (PRILOSEC OTC) 20 MG tablet Take 20 mg by mouth daily as needed (acid reflux).       . phytonadione (MEPHYTON) 5 MG tablet Take 1 tablet (5 mg total) by mouth once. Today 11/02/13  1 tablet  0  . silodosin (RAPAFLO) 4 MG CAPS capsule Take 4 mg by mouth daily.       [name] VITAMIN D, CHOLECALCIFEROL, PO Take 2,000 Units by mouth daily.       [name] warfarin (COUMADIN) 5 MG tablet Take as directed by anticoagulation clinic  90 tablet  1   No current facility-administered medications for this visit.    Allergies  Allergen Reactions  . Morphine Nausea Only    Review of Systems negative except from HPI and PMH  Physical Exam BP 122/71  Pulse 81  Ht 5\' 8"   (1.727 m)  Wt 164 lb (74.39 kg)  BMI 24.94 kg/m2 Well developed and well nourished in no acute distress HENT normal E scleral and icterus clear Neck Supple JVP 8 t; carotids brisk and full Clear to ausculation Regular rate and rhythm, no murmurs gallops or rub Soft with active bowel sounds No clubbing cyanosis 2+ Edema Alert and oriented, grossly normal motor and sensory function Skin Warm and Dry  ECG Afib  V pacing    Assessment and  Plan  Complete heart block  Atrial fibrillation-persistent  HFpEF  Pacemaker-[employer]  With recurrent atrial fibrillation, alternative therapies need to be considered; apparently he was intolerant/allergic to amiodarone. He has not been on any 1C agent.  Thoughts would include use of ranolazine adjunct to his dofetilide. Dr. [name] has been intimately involved in his atrial fibrillation management and so I will defer these discussions to Dr. [name] unless he would like me to communicate     For now we will increase his Lasix from 40 daily--twice a day  for 5 days. Renal function was normal 4/15  It has been many years since his LV function was evaluated; we'll repeat an echocardiogram

## 2014-02-12 ENCOUNTER — Telehealth: Payer: Self-pay | Admitting: Interventional Cardiology

## 2014-02-12 DIAGNOSIS — N320 Bladder-neck obstruction: Secondary | ICD-10-CM | POA: Diagnosis not present

## 2014-02-12 DIAGNOSIS — N400 Enlarged prostate without lower urinary tract symptoms: Secondary | ICD-10-CM | POA: Diagnosis not present

## 2014-02-12 DIAGNOSIS — IMO0002 Reserved for concepts with insufficient information to code with codable children: Secondary | ICD-10-CM | POA: Diagnosis not present

## 2014-02-12 DIAGNOSIS — N398 Other specified disorders of urinary system: Secondary | ICD-10-CM | POA: Insufficient documentation

## 2014-02-12 NOTE — Telephone Encounter (Signed)
returned pt wife call. phone rings out. unable to lmom.

## 2014-02-12 NOTE — Telephone Encounter (Signed)
2nd attempt lmtcb

## 2014-02-12 NOTE — Telephone Encounter (Signed)
New message    Patient was seen by Dr. Caryl Comes last week . Wife is concern about the response on what to do.

## 2014-02-13 NOTE — Telephone Encounter (Signed)
returned pt wife call. she sts that pt is back in afib after being dccv in april. pt wife sts that after pt appt with Dr.Klein on 6/25 for a pacer ck, he indicated he would refer to [facility] on how to proceed.adv pt wife Dr.Smith will be back in the office on 7/9.once Dr.Smith is updated I will call back with his recommendations. Pt sts that pt does has some fatigue otherwise pt is doing ok.Pt wife adv if symptoms develop or worsen to call the office. Pt wife agreeable and verbalized understanding.

## 2014-02-13 NOTE — Telephone Encounter (Signed)
Follow up:      Pt returning this office's call please call him back.

## 2014-02-19 ENCOUNTER — Telehealth: Payer: Self-pay | Admitting: Interventional Cardiology

## 2014-02-19 NOTE — Telephone Encounter (Signed)
New message          Pt is waiting for Adam [name] or [name] to let them know what to do / pt would like a call back

## 2014-02-19 NOTE — Telephone Encounter (Signed)
pt given Dr.Smith recommendations to sch a f/u appt to discuss options.appt made for 7/8 @8 :15 pt agreeable and verbalized understanding.

## 2014-02-21 ENCOUNTER — Other Ambulatory Visit (INDEPENDENT_AMBULATORY_CARE_PROVIDER_SITE_OTHER): Payer: Medicare Other

## 2014-02-21 ENCOUNTER — Telehealth: Payer: Self-pay

## 2014-02-21 ENCOUNTER — Ambulatory Visit (INDEPENDENT_AMBULATORY_CARE_PROVIDER_SITE_OTHER): Payer: Medicare Other | Admitting: Pharmacist

## 2014-02-21 ENCOUNTER — Telehealth: Payer: Self-pay | Admitting: Pharmacist

## 2014-02-21 ENCOUNTER — Ambulatory Visit (INDEPENDENT_AMBULATORY_CARE_PROVIDER_SITE_OTHER): Payer: Medicare Other | Admitting: Interventional Cardiology

## 2014-02-21 ENCOUNTER — Encounter: Payer: Self-pay | Admitting: Interventional Cardiology

## 2014-02-21 VITALS — BP 116/60 | HR 85 | Ht 68.0 in | Wt 161.8 lb

## 2014-02-21 DIAGNOSIS — I5022 Chronic systolic (congestive) heart failure: Secondary | ICD-10-CM | POA: Insufficient documentation

## 2014-02-21 DIAGNOSIS — Z5181 Encounter for therapeutic drug level monitoring: Secondary | ICD-10-CM

## 2014-02-21 DIAGNOSIS — I25810 Atherosclerosis of coronary artery bypass graft(s) without angina pectoris: Secondary | ICD-10-CM | POA: Diagnosis not present

## 2014-02-21 DIAGNOSIS — Z79899 Other long term (current) drug therapy: Secondary | ICD-10-CM

## 2014-02-21 DIAGNOSIS — Z950 Presence of cardiac pacemaker: Secondary | ICD-10-CM

## 2014-02-21 DIAGNOSIS — I482 Chronic atrial fibrillation, unspecified: Secondary | ICD-10-CM

## 2014-02-21 DIAGNOSIS — I480 Paroxysmal atrial fibrillation: Secondary | ICD-10-CM

## 2014-02-21 DIAGNOSIS — I4891 Unspecified atrial fibrillation: Secondary | ICD-10-CM

## 2014-02-21 DIAGNOSIS — Z951 Presence of aortocoronary bypass graft: Secondary | ICD-10-CM | POA: Diagnosis not present

## 2014-02-21 DIAGNOSIS — I498 Other specified cardiac arrhythmias: Secondary | ICD-10-CM | POA: Diagnosis not present

## 2014-02-21 DIAGNOSIS — Z7901 Long term (current) use of anticoagulants: Secondary | ICD-10-CM

## 2014-02-21 DIAGNOSIS — R001 Bradycardia, unspecified: Secondary | ICD-10-CM

## 2014-02-21 DIAGNOSIS — I5033 Acute on chronic diastolic (congestive) heart failure: Secondary | ICD-10-CM

## 2014-02-21 LAB — BASIC METABOLIC PANEL
BUN: 15 mg/dL (ref 6–23)
CHLORIDE: 101 meq/L (ref 96–112)
CO2: 29 mEq/L (ref 19–32)
CREATININE: 1 mg/dL (ref 0.4–1.5)
Calcium: 9.5 mg/dL (ref 8.4–10.5)
GFR: 78.74 mL/min (ref >60.00)
Glucose, Bld: 97 mg/dL (ref 70–99)
POTASSIUM: 4.7 meq/L (ref 3.5–5.1)
Sodium: 137 mEq/L (ref 135–145)

## 2014-02-21 LAB — POCT INR: INR: 3.1

## 2014-02-21 MED ORDER — FUROSEMIDE 40 MG PO TABS: 60.0000 mg | ORAL_TABLET | Freq: Every day | ORAL | Status: DC

## 2014-02-21 NOTE — Telephone Encounter (Signed)
Patient and his wife called to let us know he would be having a tooth extraction by Dr. Vanessa Kick, DDS next Wednesday and wanted to know what needed to be done.  It was previously thought that patient would be having a more extensive procedure done, and therefore may need to be off warfarin for 5 days.  I spoke with Dr. Vanessa Kick who agrees that patient doesn't need to be off anticoagulation for this procedure, but preferably be on lower end of normal.  His INR was 3.1 today, so I will have patient skip warfarin today, resume normal dose and recheck INR with me in 5 days (which is 2 days prior to procedure).  If needed further adjustment could be done.  Dr. Vanessa Kick agreeable to plan.

## 2014-02-21 NOTE — Telephone Encounter (Signed)
Message copied by Lamar Laundry on Wed Feb 21, 2014  3:58 PM ------      Message from: Daneen Schick      Created: Wed Feb 21, 2014  3:41 PM       Needs repeat BMET and magnesium in 1 week. v58.69 ------

## 2014-02-21 NOTE — Progress Notes (Signed)
Patient ID: BARON PARMELEE, male   DOB: 1925-03-02, 80 y.o.   [name]: [id number]    [address], [address], [place]  [address] Phone: [phone] Fax:  [phone]  Date:  02/21/2014   ID:  [name], DOB 16-Oct-1924, [name] [id number]  PCP:  Horton Finer, MD   ASSESSMENT:  1. Recurrent atrial fibrillation, persistent with acute diastolic heart failure persisting despite increase in diuretic regimen from 20-40 mg of furosemide. Atrial fibrillation is recurrent on dofetilide. Last cardioversion was March 2015. He had approximately 2.5 to 3 months of sinus rhythm before reverting. 2. Acute on chronic diastolic heart failure precipitated by persistent atrial fibrillation 3. Coronary artery disease, stable without angina 4. Tikosyn therapy  PLAN:  1. Increase furosemide 60 mg daily. 2. Basic metabolic panel today. This am increasing the diuretic dose today, we will repeat a be met in one week 3. Appointment with Dr. [name] at [facility] electrophysiology to consider alternative treatment measures for atrial fibrillation including ablation 4 pericolic followup with me in 4-6 weeks   SUBJECTIVE: EUTIMIO GHARIBIAN is a 80 y.o. male four to 6 week history of atrial fibrillation with exertional intolerance. Fortunately swelling has been noted. Furosemide 40 mg twice a day significantly improved symptoms on a five-day trial. Furosemide was then decreased to 40 mg per day. Gradual recurrence of dyspnea and lower extremity swelling has occurred. He has some mild orthopnea. No angina has occurred.  Wt Readings from Last 3 Encounters:  02/21/14 161 lb 12.8 oz (73.392 kg)  02/08/14 164 lb (74.39 kg)  11/28/13 158 lb (71.668 kg)     Past Medical History  Diagnosis Date  . S/P CABG (coronary artery bypass graft)   . Hyperlipidemia   . GERD (gastroesophageal reflux disease)   . Presence of permanent cardiac pacemaker   . Chronic anticoagulation   . LBP (low  back pain)   . AAA (abdominal aortic aneurysm)   . Diverticulosis   . Arthritis   . CAD (coronary artery disease)     [name]  . PAF (paroxysmal atrial fibrillation)     Cardioversion June 2011 & IN 2012  . CHF (congestive heart failure)     Current Outpatient Prescriptions  Medication Sig Dispense Refill  . acetaminophen (TYLENOL) 650 MG CR tablet Take 650 mg by mouth 2 (two) times daily.       [name] atorvastatin (LIPITOR) 10 MG tablet Take 10 mg by mouth daily.        [name] dofetilide (TIKOSYN) 125 MCG capsule Take 1 capsule (125 mcg total) by mouth 2 (two) times daily.  60 capsule  10  . furosemide (LASIX) 40 MG tablet Take one daily and as directed  45 tablet  11  . latanoprost (XALATAN) 0.005 % ophthalmic solution Place 1 drop into both eyes at bedtime.       . metoprolol tartrate (LOPRESSOR) 25 MG tablet Take 1 tablet (25 mg total) by mouth 2 (two) times daily.  180 tablet  1  . metoprolol tartrate (LOPRESSOR) 25 MG tablet TAKE 1 TABLET BY MOUTH TWICE DAILY WITH FOOD  180 tablet  0  . Multiple Vitamins-Minerals (RA VISION-VITE PRESERVE PO) Take 1 tablet by mouth daily.       . nitroGLYCERIN (NITROSTAT) 0.4 MG SL tablet Place 0.4 mg under the tongue every 5 (five) minutes as needed. For chest pain      . Omega-3 Fatty Acids (FISH OIL) 1000  MG CPDR Take 1 capsule by mouth daily.       [name] omeprazole (PRILOSEC OTC) 20 MG tablet Take 20 mg by mouth daily as needed (acid reflux).       . phytonadione (MEPHYTON) 5 MG tablet Take 1 tablet (5 mg total) by mouth once. Today 11/02/13  1 tablet  0  . silodosin (RAPAFLO) 8 MG CAPS capsule Take 8 mg by mouth daily with breakfast.      . VITAMIN D, CHOLECALCIFEROL, PO Take 2,000 Units by mouth daily.       [name] warfarin (COUMADIN) 5 MG tablet Take as directed by anticoagulation clinic  90 tablet  1   No current facility-administered medications for this visit.    Allergies:    Allergies  Allergen Reactions  . Amiodarone Other (See Comments)     Ended up in ER unable to breath and red   . Morphine Nausea Only    Social History:  The patient  reports that he quit smoking about 65 years ago. His smoking use included Cigarettes. He smoked 0.00 packs per day for 3 years. He has never used smokeless tobacco. He reports that he does not drink alcohol or use illicit drugs.   ROS:  Please see the history of present illness.   The patient has exertional fatigue. There is trace edema. He seems anxious. Not sleeping well.   All other systems reviewed and negative.   OBJECTIVE: VS:  BP 116/60  Pulse 85  Ht $R'5\' 8"'DQ$  (1.727 m)  Wt 161 lb 12.8 oz (73.392 kg)  BMI 24.61 kg/m2 Well nourished, well developed, in no acute distress, elderly and anxious appearing HEENT: normal Neck: JVD moderately elevated with a V waves up to the angle of the jaw. Carotid bruit absent  Cardiac:  normal S1, S2; RRR; no murmur Lungs:  clear to auscultation bilaterally, no wheezing, rhonchi or rales Abd: soft, nontender, no hepatomegaly Ext: Edema trace bilateral. Pulses diminished Skin: warm and dry Neuro:  CNs 2-12 intact, no focal abnormalities noted  EKG:  Ventricular pacing, rate 85 beats per minute, background atrial fibrillation and/or atrial flutter noted on 12-lead       Signed, Illene Labrador III, MD 02/21/2014 8:44 AM

## 2014-02-21 NOTE — Patient Instructions (Signed)
Your physician has recommended you make the following change in your medication:  1) INCREASE Lasix to 60mg  daily. And Rx has been sent to your pharmacy  Lab Today: [facility]  You have been referred to [facility] @ [facility].   Your physician recommends that you schedule a follow-up appointment in: 6 weeks

## 2014-02-21 NOTE — Telephone Encounter (Signed)
pt given lab results and Dr.Smith instructions Needs repeat BMET and magnesium in 1 week. lab appt sch for 7/15.pt aware and verbalized understanding.

## 2014-02-26 ENCOUNTER — Ambulatory Visit (INDEPENDENT_AMBULATORY_CARE_PROVIDER_SITE_OTHER): Payer: Medicare Other | Admitting: Pharmacist

## 2014-02-26 DIAGNOSIS — I4891 Unspecified atrial fibrillation: Secondary | ICD-10-CM

## 2014-02-26 DIAGNOSIS — Z5181 Encounter for therapeutic drug level monitoring: Secondary | ICD-10-CM

## 2014-02-26 LAB — POCT INR: INR: 2.4

## 2014-02-28 ENCOUNTER — Other Ambulatory Visit (HOSPITAL_COMMUNITY): Payer: Medicare Other

## 2014-02-28 ENCOUNTER — Other Ambulatory Visit: Payer: Medicare Other

## 2014-03-06 ENCOUNTER — Other Ambulatory Visit: Payer: Self-pay | Admitting: Internal Medicine

## 2014-03-07 ENCOUNTER — Other Ambulatory Visit (INDEPENDENT_AMBULATORY_CARE_PROVIDER_SITE_OTHER): Payer: Medicare Other

## 2014-03-07 ENCOUNTER — Ambulatory Visit (HOSPITAL_COMMUNITY): Payer: Medicare Other | Attending: Internal Medicine | Admitting: Cardiology

## 2014-03-07 ENCOUNTER — Other Ambulatory Visit: Payer: Self-pay | Admitting: Internal Medicine

## 2014-03-07 DIAGNOSIS — Z950 Presence of cardiac pacemaker: Secondary | ICD-10-CM

## 2014-03-07 DIAGNOSIS — Z79899 Other long term (current) drug therapy: Secondary | ICD-10-CM

## 2014-03-07 DIAGNOSIS — I2510 Atherosclerotic heart disease of native coronary artery without angina pectoris: Secondary | ICD-10-CM | POA: Diagnosis not present

## 2014-03-07 DIAGNOSIS — I079 Rheumatic tricuspid valve disease, unspecified: Secondary | ICD-10-CM | POA: Insufficient documentation

## 2014-03-07 DIAGNOSIS — I517 Cardiomegaly: Secondary | ICD-10-CM | POA: Diagnosis not present

## 2014-03-07 DIAGNOSIS — E785 Hyperlipidemia, unspecified: Secondary | ICD-10-CM | POA: Insufficient documentation

## 2014-03-07 DIAGNOSIS — I359 Nonrheumatic aortic valve disorder, unspecified: Secondary | ICD-10-CM | POA: Insufficient documentation

## 2014-03-07 DIAGNOSIS — Z87891 Personal history of nicotine dependence: Secondary | ICD-10-CM | POA: Diagnosis not present

## 2014-03-07 DIAGNOSIS — R001 Bradycardia, unspecified: Secondary | ICD-10-CM

## 2014-03-07 DIAGNOSIS — I509 Heart failure, unspecified: Secondary | ICD-10-CM | POA: Insufficient documentation

## 2014-03-07 DIAGNOSIS — I4891 Unspecified atrial fibrillation: Secondary | ICD-10-CM | POA: Insufficient documentation

## 2014-03-07 DIAGNOSIS — I480 Paroxysmal atrial fibrillation: Secondary | ICD-10-CM

## 2014-03-07 LAB — BASIC METABOLIC PANEL
BUN: 19 mg/dL (ref 6–23)
CO2: 30 mEq/L (ref 19–32)
Calcium: 9.1 mg/dL (ref 8.4–10.5)
Chloride: 100 mEq/L (ref 96–112)
Creatinine, Ser: 0.9 mg/dL (ref 0.4–1.5)
GFR: 83.74 mL/min (ref >60.00)
Glucose, Bld: 88 mg/dL (ref 70–99)
Potassium: 3.9 mEq/L (ref 3.5–5.1)
SODIUM: 138 meq/L (ref 135–145)

## 2014-03-07 LAB — MAGNESIUM: MAGNESIUM: 2 mg/dL (ref 1.5–2.5)

## 2014-03-07 NOTE — Progress Notes (Signed)
Echo performed. 

## 2014-03-09 ENCOUNTER — Telehealth: Payer: Self-pay

## 2014-03-09 NOTE — Telephone Encounter (Signed)
Message copied by Lamar Laundry on Fri Mar 09, 2014  1:47 PM ------      Message from: Daneen Schick      Created: Wed Mar 07, 2014  6:25 PM       Labs are good. ------

## 2014-03-09 NOTE — Telephone Encounter (Signed)
lmom.Labs are good.

## 2014-03-14 DIAGNOSIS — Z85828 Personal history of other malignant neoplasm of skin: Secondary | ICD-10-CM | POA: Diagnosis not present

## 2014-03-14 DIAGNOSIS — L570 Actinic keratosis: Secondary | ICD-10-CM | POA: Diagnosis not present

## 2014-03-14 DIAGNOSIS — D1801 Hemangioma of skin and subcutaneous tissue: Secondary | ICD-10-CM | POA: Diagnosis not present

## 2014-03-14 DIAGNOSIS — D239 Other benign neoplasm of skin, unspecified: Secondary | ICD-10-CM | POA: Diagnosis not present

## 2014-03-14 DIAGNOSIS — L819 Disorder of pigmentation, unspecified: Secondary | ICD-10-CM | POA: Diagnosis not present

## 2014-03-14 DIAGNOSIS — L821 Other seborrheic keratosis: Secondary | ICD-10-CM | POA: Diagnosis not present

## 2014-03-19 DIAGNOSIS — R9431 Abnormal electrocardiogram [ECG] [EKG]: Secondary | ICD-10-CM | POA: Diagnosis not present

## 2014-03-19 DIAGNOSIS — Z951 Presence of aortocoronary bypass graft: Secondary | ICD-10-CM | POA: Diagnosis not present

## 2014-03-19 DIAGNOSIS — Z45018 Encounter for adjustment and management of other part of cardiac pacemaker: Secondary | ICD-10-CM | POA: Diagnosis not present

## 2014-03-19 DIAGNOSIS — I4430 Unspecified atrioventricular block: Secondary | ICD-10-CM | POA: Diagnosis not present

## 2014-03-19 DIAGNOSIS — Z950 Presence of cardiac pacemaker: Secondary | ICD-10-CM | POA: Diagnosis not present

## 2014-03-19 DIAGNOSIS — I4892 Unspecified atrial flutter: Secondary | ICD-10-CM | POA: Diagnosis not present

## 2014-03-19 DIAGNOSIS — I2510 Atherosclerotic heart disease of native coronary artery without angina pectoris: Secondary | ICD-10-CM | POA: Diagnosis not present

## 2014-03-19 DIAGNOSIS — I4891 Unspecified atrial fibrillation: Secondary | ICD-10-CM | POA: Diagnosis not present

## 2014-03-19 NOTE — Telephone Encounter (Signed)
Mr. Spangler is being evaluated by Dr. [name] today. An echocardiogram ordered by Dr. Caryl Comes and performed on 03/07/14 demonstrated new decrease in LV systolic function with an EF of 25-30% with regional wall motion abnormality. I was unaware of this finding. After speaking with Dr. [name], we will proceed with coronary angiography to rule out an ischemic etiology for decreased LV function. If significant obstructive disease is not found, he will be referred back to Dr. [name] for consideration of ablation. He would need to be off Coumadin for 3-5 days prior to angiography.

## 2014-03-19 NOTE — Telephone Encounter (Signed)
Follow up    Seen MD today at [place] wife is follow up on procedure that need to be schedule.

## 2014-03-19 NOTE — Telephone Encounter (Signed)
Forwarding to Lisa

## 2014-03-20 ENCOUNTER — Ambulatory Visit (INDEPENDENT_AMBULATORY_CARE_PROVIDER_SITE_OTHER): Payer: Medicare Other | Admitting: Pharmacist

## 2014-03-20 DIAGNOSIS — Z5181 Encounter for therapeutic drug level monitoring: Secondary | ICD-10-CM | POA: Diagnosis not present

## 2014-03-20 DIAGNOSIS — I4891 Unspecified atrial fibrillation: Secondary | ICD-10-CM | POA: Diagnosis not present

## 2014-03-20 LAB — POCT INR: INR: 4

## 2014-03-20 NOTE — Telephone Encounter (Signed)
called and spoke with pt wife. pt wife adv that Dr.Bahnson and Dr.Smith talked and hve decided to proceed with a coronary angio to evaluate decreases LV function. pt wife adv that I am in clinic today. I will have to sch pt cath later in the wk and callback with a date,time, and instructions,pt wife agreeable and verbalized understanding

## 2014-03-21 ENCOUNTER — Telehealth: Payer: Self-pay

## 2014-03-21 DIAGNOSIS — Z7901 Long term (current) use of anticoagulants: Secondary | ICD-10-CM

## 2014-03-21 DIAGNOSIS — Z01812 Encounter for preprocedural laboratory examination: Secondary | ICD-10-CM

## 2014-03-21 NOTE — Telephone Encounter (Signed)
Follow Up ° °Pt returned call//  °

## 2014-03-21 NOTE — Telephone Encounter (Signed)
pt aware cath sch for 8/12 @ 10am.pt to come in for cvrr appt on 8/11 @10 :45 and preprocedure labs.pt wife given verbal preporcedure instructions .adv to hold coumadin 5 days prior to cath. last dose to be taken on 8/6. written instructions mailed to pt.pt verbalized understanding

## 2014-03-21 NOTE — Telephone Encounter (Signed)
called to give pt cath date,time, and instructions, lmtcb

## 2014-03-22 ENCOUNTER — Other Ambulatory Visit: Payer: Self-pay | Admitting: Interventional Cardiology

## 2014-03-22 DIAGNOSIS — I5021 Acute systolic (congestive) heart failure: Secondary | ICD-10-CM

## 2014-03-26 ENCOUNTER — Encounter (HOSPITAL_COMMUNITY): Payer: Self-pay | Admitting: Pharmacy Technician

## 2014-03-27 ENCOUNTER — Other Ambulatory Visit (INDEPENDENT_AMBULATORY_CARE_PROVIDER_SITE_OTHER): Payer: Medicare Other

## 2014-03-27 ENCOUNTER — Ambulatory Visit (INDEPENDENT_AMBULATORY_CARE_PROVIDER_SITE_OTHER): Payer: Medicare Other | Admitting: Pharmacist Clinician (PhC)/ Clinical Pharmacy Specialist

## 2014-03-27 ENCOUNTER — Other Ambulatory Visit: Payer: Self-pay | Admitting: Interventional Cardiology

## 2014-03-27 DIAGNOSIS — I5021 Acute systolic (congestive) heart failure: Secondary | ICD-10-CM

## 2014-03-27 DIAGNOSIS — Z7901 Long term (current) use of anticoagulants: Secondary | ICD-10-CM

## 2014-03-27 DIAGNOSIS — Z01812 Encounter for preprocedural laboratory examination: Secondary | ICD-10-CM | POA: Diagnosis not present

## 2014-03-27 DIAGNOSIS — I4891 Unspecified atrial fibrillation: Secondary | ICD-10-CM | POA: Diagnosis not present

## 2014-03-27 DIAGNOSIS — Z5181 Encounter for therapeutic drug level monitoring: Secondary | ICD-10-CM | POA: Diagnosis not present

## 2014-03-27 LAB — BASIC METABOLIC PANEL
BUN: 18 mg/dL (ref 6–23)
CHLORIDE: 102 meq/L (ref 96–112)
CO2: 26 meq/L (ref 19–32)
Calcium: 9.3 mg/dL (ref 8.4–10.5)
Creatinine, Ser: 0.9 mg/dL (ref 0.4–1.5)
GFR: 80.65 mL/min (ref >60.00)
GLUCOSE: 91 mg/dL (ref 70–99)
POTASSIUM: 4.7 meq/L (ref 3.5–5.1)
SODIUM: 138 meq/L (ref 135–145)

## 2014-03-27 LAB — PROTIME-INR
INR: 1.2 ratio — ABNORMAL HIGH (ref 0.8–1.0)
Prothrombin Time: 13.2 s — ABNORMAL HIGH (ref 9.6–13.1)

## 2014-03-27 LAB — CBC WITH DIFFERENTIAL/PLATELET
BASOS ABS: 0 10*3/uL (ref 0.0–0.1)
Basophils Relative: 0.4 % (ref 0.0–3.0)
Eosinophils Absolute: 0.1 10*3/uL (ref 0.0–0.7)
Eosinophils Relative: 1 % (ref 0.0–5.0)
HEMATOCRIT: 38.3 % — AB (ref 39.0–52.0)
Hemoglobin: 12.9 g/dL — ABNORMAL LOW (ref 13.0–17.0)
LYMPHS ABS: 1.8 10*3/uL (ref 0.7–4.0)
Lymphocytes Relative: 21.1 % (ref 12.0–46.0)
MCHC: 33.7 g/dL (ref 30.0–36.0)
MCV: 100.4 fl — AB (ref 78.0–100.0)
MONO ABS: 0.9 10*3/uL (ref 0.1–1.0)
Monocytes Relative: 10.3 % (ref 3.0–12.0)
Neutro Abs: 5.6 10*3/uL (ref 1.4–7.7)
Neutrophils Relative %: 67.2 % (ref 43.0–77.0)
PLATELETS: 116 10*3/uL — AB (ref 150.0–400.0)
RBC: 3.81 Mil/uL — ABNORMAL LOW (ref 4.22–5.81)
RDW: 14.1 % (ref 11.5–15.5)
WBC: 8.4 10*3/uL (ref 4.0–10.5)

## 2014-03-27 LAB — POCT INR: INR: 1.2

## 2014-03-28 ENCOUNTER — Ambulatory Visit (HOSPITAL_COMMUNITY)
Admission: RE | Admit: 2014-03-28 | Discharge: 2014-03-28 | Disposition: A | Discharge status: Home / Self Care | Payer: Medicare Other | Source: Ambulatory Visit | Attending: Interventional Cardiology | Admitting: Interventional Cardiology

## 2014-03-28 ENCOUNTER — Encounter (HOSPITAL_COMMUNITY)
Admission: RE | Disposition: A | Discharge status: Home / Self Care | Payer: Self-pay | Source: Ambulatory Visit | Attending: Interventional Cardiology

## 2014-03-28 ENCOUNTER — Telehealth: Payer: Self-pay | Admitting: Interventional Cardiology

## 2014-03-28 DIAGNOSIS — I4891 Unspecified atrial fibrillation: Secondary | ICD-10-CM | POA: Insufficient documentation

## 2014-03-28 DIAGNOSIS — Z87891 Personal history of nicotine dependence: Secondary | ICD-10-CM | POA: Diagnosis not present

## 2014-03-28 DIAGNOSIS — Z885 Allergy status to narcotic agent status: Secondary | ICD-10-CM | POA: Insufficient documentation

## 2014-03-28 DIAGNOSIS — I714 Abdominal aortic aneurysm, without rupture, unspecified: Secondary | ICD-10-CM | POA: Insufficient documentation

## 2014-03-28 DIAGNOSIS — I2510 Atherosclerotic heart disease of native coronary artery without angina pectoris: Secondary | ICD-10-CM | POA: Insufficient documentation

## 2014-03-28 DIAGNOSIS — I25810 Atherosclerosis of coronary artery bypass graft(s) without angina pectoris: Secondary | ICD-10-CM | POA: Diagnosis not present

## 2014-03-28 DIAGNOSIS — E785 Hyperlipidemia, unspecified: Secondary | ICD-10-CM | POA: Insufficient documentation

## 2014-03-28 DIAGNOSIS — K5730 Diverticulosis of large intestine without perforation or abscess without bleeding: Secondary | ICD-10-CM | POA: Insufficient documentation

## 2014-03-28 DIAGNOSIS — Z950 Presence of cardiac pacemaker: Secondary | ICD-10-CM | POA: Diagnosis not present

## 2014-03-28 DIAGNOSIS — Z9089 Acquired absence of other organs: Secondary | ICD-10-CM | POA: Diagnosis not present

## 2014-03-28 DIAGNOSIS — Z888 Allergy status to other drugs, medicaments and biological substances status: Secondary | ICD-10-CM | POA: Diagnosis not present

## 2014-03-28 DIAGNOSIS — Z96659 Presence of unspecified artificial knee joint: Secondary | ICD-10-CM | POA: Insufficient documentation

## 2014-03-28 DIAGNOSIS — M545 Low back pain, unspecified: Secondary | ICD-10-CM | POA: Insufficient documentation

## 2014-03-28 DIAGNOSIS — I519 Heart disease, unspecified: Secondary | ICD-10-CM | POA: Diagnosis present

## 2014-03-28 DIAGNOSIS — I2582 Chronic total occlusion of coronary artery: Secondary | ICD-10-CM | POA: Insufficient documentation

## 2014-03-28 DIAGNOSIS — I509 Heart failure, unspecified: Secondary | ICD-10-CM | POA: Diagnosis not present

## 2014-03-28 DIAGNOSIS — Z7901 Long term (current) use of anticoagulants: Secondary | ICD-10-CM | POA: Insufficient documentation

## 2014-03-28 DIAGNOSIS — I5021 Acute systolic (congestive) heart failure: Secondary | ICD-10-CM

## 2014-03-28 DIAGNOSIS — Z9849 Cataract extraction status, unspecified eye: Secondary | ICD-10-CM | POA: Diagnosis not present

## 2014-03-28 DIAGNOSIS — I482 Chronic atrial fibrillation, unspecified: Secondary | ICD-10-CM

## 2014-03-28 DIAGNOSIS — M129 Arthropathy, unspecified: Secondary | ICD-10-CM | POA: Insufficient documentation

## 2014-03-28 DIAGNOSIS — K219 Gastro-esophageal reflux disease without esophagitis: Secondary | ICD-10-CM | POA: Insufficient documentation

## 2014-03-28 HISTORY — PX: LEFT HEART CATHETERIZATION WITH CORONARY ANGIOGRAM: SHX5451

## 2014-03-28 LAB — PROTIME-INR
INR: 1.24 (ref 0.00–1.49)
PROTHROMBIN TIME: 15.6 s — AB (ref 11.6–15.2)

## 2014-03-28 SURGERY — LEFT HEART CATHETERIZATION WITH CORONARY ANGIOGRAM
Anesthesia: LOCAL

## 2014-03-28 MED ORDER — ASPIRIN 81 MG PO CHEW
CHEWABLE_TABLET | ORAL | Status: AC
  Filled 2014-03-28: qty 1

## 2014-03-28 MED ORDER — VERAPAMIL HCL 2.5 MG/ML IV SOLN
INTRAVENOUS | Status: AC
  Filled 2014-03-28: qty 2

## 2014-03-28 MED ORDER — ASPIRIN 81 MG PO CHEW: 81.0000 mg | CHEWABLE_TABLET | ORAL | Status: DC

## 2014-03-28 MED ORDER — SODIUM CHLORIDE 0.9 % IJ SOLN: 3.0000 mL | INTRAMUSCULAR | Status: DC | PRN

## 2014-03-28 MED ORDER — HEPARIN (PORCINE) IN NACL 2-0.9 UNIT/ML-% IJ SOLN
INTRAMUSCULAR | Status: AC
  Filled 2014-03-28: qty 1000

## 2014-03-28 MED ORDER — SODIUM CHLORIDE 0.9 % IV SOLN: 250.0000 mL | INTRAVENOUS | Status: DC | PRN

## 2014-03-28 MED ORDER — ASPIRIN 81 MG PO CHEW
81.0000 mg | CHEWABLE_TABLET | ORAL | Status: AC
  Administered 2014-03-28: 81 mg via ORAL

## 2014-03-28 MED ORDER — FENTANYL CITRATE 0.05 MG/ML IJ SOLN
INTRAMUSCULAR | Status: AC
  Filled 2014-03-28: qty 2

## 2014-03-28 MED ORDER — SODIUM CHLORIDE 0.9 % IV SOLN: INTRAVENOUS | Status: AC

## 2014-03-28 MED ORDER — SODIUM CHLORIDE 0.9 % IJ SOLN: 3.0000 mL | Freq: Two times a day (BID) | INTRAMUSCULAR | Status: DC

## 2014-03-28 MED ORDER — SODIUM CHLORIDE 0.9 % IV SOLN: INTRAVENOUS | Status: DC

## 2014-03-28 MED ORDER — LIDOCAINE HCL (PF) 1 % IJ SOLN
INTRAMUSCULAR | Status: AC
  Filled 2014-03-28: qty 30

## 2014-03-28 MED ORDER — HEPARIN SODIUM (PORCINE) 1000 UNIT/ML IJ SOLN
INTRAMUSCULAR | Status: AC
  Filled 2014-03-28: qty 1

## 2014-03-28 MED ORDER — ONDANSETRON HCL 4 MG/2ML IJ SOLN
4.0000 mg | Freq: Four times a day (QID) | INTRAMUSCULAR | Status: DC | PRN
Start: 2014-03-28 — End: 2014-03-28

## 2014-03-28 MED ORDER — NITROGLYCERIN 1 MG/10 ML FOR IR/CATH LAB
INTRA_ARTERIAL | Status: AC
  Filled 2014-03-28: qty 10

## 2014-03-28 MED ORDER — MIDAZOLAM HCL 2 MG/2ML IJ SOLN
INTRAMUSCULAR | Status: AC
  Filled 2014-03-28: qty 2

## 2014-03-28 MED ORDER — ACETAMINOPHEN 325 MG PO TABS: 650.0000 mg | ORAL_TABLET | ORAL | Status: DC | PRN

## 2014-03-28 MED ORDER — SODIUM CHLORIDE 0.9 % IV SOLN
INTRAVENOUS | Status: DC
  Administered 2014-03-28: 10:00:00 via INTRAVENOUS

## 2014-03-28 NOTE — Discharge Instructions (Signed)
Radial Site Care °Refer to this sheet in the next few weeks. These instructions provide you with information on caring for yourself after your procedure. Your caregiver may also give you more specific instructions. Your treatment has been planned according to current medical practices, but problems sometimes occur. Call your caregiver if you have any problems or questions after your procedure. °HOME CARE INSTRUCTIONS °· You may shower the day after the procedure. Remove the bandage (dressing) and gently wash the site with plain soap and water. Gently pat the site dry. °· Do not apply powder or lotion to the site. °· Do not submerge the affected site in water for 3 to 5 days. °· Inspect the site at least twice daily. °· Do not flex or bend the affected arm for 24 hours. °· No lifting over 5 pounds (2.3 kg) for 5 days after your procedure. °· Do not drive home if you are discharged the same day of the procedure. Have someone else drive you. °· You may drive 24 hours after the procedure unless otherwise instructed by your caregiver. °· Do not operate machinery or power tools for 24 hours. °· A responsible adult should be with you for the first 24 hours after you arrive home. °What to expect: °· Any bruising will usually fade within 1 to 2 weeks. °· Blood that collects in the tissue (hematoma) may be painful to the touch. It should usually decrease in size and tenderness within 1 to 2 weeks. °SEEK IMMEDIATE MEDICAL CARE IF: °· You have unusual pain at the radial site. °· You have redness, warmth, swelling, or pain at the radial site. °· You have drainage (other than a small amount of blood on the dressing). °· You have chills. °· You have a fever or persistent symptoms for more than 72 hours. °· You have a fever and your symptoms suddenly get worse. °· Your arm becomes pale, cool, tingly, or numb. °· You have heavy bleeding from the site. Hold pressure on the site. °Document Released: 09/05/2010 Document Revised:  10/26/2011 Document Reviewed: 09/05/2010 °ExitCare® Patient Information ©2015 [employer]. This information is not intended to replace advice given to you by your health care provider. Make sure you discuss any questions you have with your health care provider. ° °

## 2014-03-28 NOTE — Telephone Encounter (Signed)
Please arrange for the patient to have a sleep study. Please schedule f/u with Dr. [name] at [facility].

## 2014-03-28 NOTE — CV Procedure (Signed)
     Left Heart Catheterization with Coronary and Bypass Angiography  Report  Adam [name]  80 y.o.  male 1924/08/25  Procedure Date: 03/28/2014 Referring Physician: Hadassah Pais, M.D.; Virl Axe M.D. Primary Cardiologist: [facility] Blenda Bridegroom, M.D.  INDICATIONS: Unexplained left ventricular dysfunction. History of atrial arrhythmia but no angina.  PROCEDURE: 1. Left heart catheterization; 2. Coronary angiography; 3. Left ventriculography; 4. Bypass graft angiography; 5. Internal mammary graft angiography  CONSENT:  The risks, benefits, and details of the procedure were explained in detail to the patient. Risks including death, stroke, heart attack, kidney injury, allergy, limb ischemia, bleeding and radiation injury were discussed.  The patient verbalized understanding and wanted to proceed.  Informed written consent was obtained.  PROCEDURE TECHNIQUE:  After Xylocaine anesthesia a 5 French Slender sheath was placed in the left radial artery with an angiocath and the modified Seldinger technique.  Coronary angiography was done using a 5 F JR 4 and JL 4 diagnostic catheters.  Left ventriculography was done using the JR 4 catheter and hand injection.   After review of the digital images the case was terminated.   CONTRAST:  Total of 105 cc.  COMPLICATIONS:  None   HEMODYNAMICS:  Aortic pressure 120/75 mmHg; LV pressure 124/12 mmHg; LVEDP 17 mm mercury  ANGIOGRAPHIC DATA:   The left main coronary artery is diffuse narrowing.  The left anterior descending artery is totally occluded at origin from the left main.  The left circumflex artery is 90% ostial stenosis followed by 2 small obtuse marginal branches. The left atrial recurrent arises proximal to the stenosis. Total myocardium supplied by the circumflex territory appears to be less than 10%.  The right coronary artery is totally occluded distally. There are right to right collaterals.  BYPASS GRAFT ANGIOGRAPHY: Saphenous vein  graft to the obtuse marginal/circumflex is totally occluded (chronic and known from prior catheterization in the 1990s).  Saphenous vein graft to the diagonal: Widely patent. Diffusely calcified. Eccentric 30-40% proximal narrowing.  Saphenous vein graft to the distal RCA: Widely patent  Left internal mammary graft to the LAD: Widely patent  LEFT VENTRICULOGRAM:  Left ventricular angiogram was done in the 30 RAO projection and revealed  Dilated and globally hypo-contractile with an estimated ejection fraction of 25%.   IMPRESSIONS:  1. Severe native vessel coronary disease with total occlusion of the LAD, total occlusion of the distal RCA, and high-grade proximal circumflex (very small territory is supplied). The vessels are heavily calcified.  2. Patent LIMA to LAD, widely patent saphenous vein graft to the first diagonal, and widely patent saphenous vein graft to the RCA. The saphenous vein graft to the small circumflex is totally occluded (chronic).  3. Left ventricular dilatation and hypocontractility with EF 25%. Coronary obstruction would not account for the patient's global reduction in LV function. The left ventricular dysfunction seems unrelated to ischemia/infarction raising the question of tachycardia mediated mechanism versus idiopathic etiology.   RECOMMENDATION:  Continue management of arrhythmia per Dr. [name] and Dr. Caryl Comes.

## 2014-03-28 NOTE — H&P (Signed)
Adam [name] is a 80 y.o. male  Admit Date: 03/28/2014 Referring Physician: Hadassah Pais, M.D. Primary Cardiologist:: HWB Blenda Bridegroom, M.D. Chief complaint / reason for admission: New left ventricular systolic dysfunction  HPI: 80 year old gentleman referred to [facility] electrophysiology service for consideration of ablation therapy because of recurrent atrial fibrillation despite antiarrhythmic therapy. He is on dofetilide. He cannot tolerate amiodarone. An echocardiogram done recently demonstrated an EF of 25-30% which is new compared to prior studies. The patient has class III exertional intolerance. He denies angina. He has not had syncope. He is being studied to rule out the possibility of bypass graft failure/ischemia and infarction as the source of the new reduction in heart function.  Bypass surgery was performed in 1987 when he received a LIMA to the LAD, SVG to the circumflex, SVG to a diagonal, and SVG to distal RCA. At the most recent heart catheterization in the 1990s, SVG to the circumflex is known to be occluded.  PMH:    Past Medical History  Diagnosis Date  . S/P CABG (coronary artery bypass graft)   . Hyperlipidemia   . GERD (gastroesophageal reflux disease)   . Presence of permanent cardiac pacemaker   . Chronic anticoagulation   . LBP (low back pain)   . AAA (abdominal aortic aneurysm)   . Diverticulosis   . Arthritis   . CAD (coronary artery disease)     [name]  . PAF (paroxysmal atrial fibrillation)     Cardioversion June 2011 & IN 2012  . CHF (congestive heart failure)     PSH:    Past Surgical History  Procedure Laterality Date  . Coronary artery bypass graft  Feb 1989  . Permanent pacemaker  July 2008  . Tonsillectomy    . Cataract extraction    . Joint replacement  1995 & 1996    Bilateral total knee replacements  . Endovascular stent insertion  07/28/2011    Procedure: ENDOVASCULAR STENT GRAFT INSERTION;   Surgeon: Hinda Lenis, MD;  Location: [place];  Service: Vascular;  Laterality: N/A;  Pt. on Coumadin/ instructed to hold 5 days prior to procedure  . Cardioversion  06/30/2012    Procedure: CARDIOVERSION;  Surgeon: Sinclair Grooms, MD;  Location: [facility] ENDOSCOPY;  Service: Cardiovascular;  Laterality: N/A;  . Cardioversion N/A 11/23/2013    Procedure: CARDIOVERSION;  Surgeon: Sinclair Grooms, MD;  Location: [facility] ENDOSCOPY;  Service: Cardiovascular;  Laterality: N/A;   ALLERGIES:   Amiodarone and Morphine No current facility-administered medications on file prior to encounter.   Current Outpatient Prescriptions on File Prior to Encounter  Medication Sig Dispense Refill  . acetaminophen (TYLENOL) 650 MG CR tablet Take 650 mg by mouth 2 (two) times daily.       [name] atorvastatin (LIPITOR) 10 MG tablet Take 10 mg by mouth daily.        [name] dofetilide (TIKOSYN) 125 MCG capsule Take 1 capsule (125 mcg total) by mouth 2 (two) times daily.  60 capsule  10  . furosemide (LASIX) 40 MG tablet Take 1.5 tablets (60 mg total) by mouth daily. Take one daily and as directed  45 tablet  11  . latanoprost (XALATAN) 0.005 % ophthalmic solution Place 1 drop into both eyes at bedtime.       . metoprolol tartrate (LOPRESSOR) 25 MG tablet TAKE 1 TABLET BY MOUTH TWICE DAILY WITH FOOD  180 tablet  3  . Multiple Vitamins-Minerals (RA  VISION-VITE PRESERVE PO) Take 1 tablet by mouth daily.       . nitroGLYCERIN (NITROSTAT) 0.4 MG SL tablet Place 0.4 mg under the tongue every 5 (five) minutes as needed. For chest pain      . Omega-3 Fatty Acids (FISH OIL) 1000 MG CPDR Take 1 capsule by mouth daily.       [name] omeprazole (PRILOSEC OTC) 20 MG tablet Take 20 mg by mouth daily as needed (acid reflux).       . silodosin (RAPAFLO) 8 MG CAPS capsule Take 8 mg by mouth daily with breakfast.       Dofetilide 0.125 mg twice a day    Family HX:    Family History  Problem Relation Age of Onset  . Stroke Mother   . Heart failure  Mother   . Heart attack Father   . Heart disease Father   . Cancer Sister     breast  . Other Sister     DJD  . Heart disease Sister   . Heart disease Sister    Social HX:    History   Social History  . Marital Status: Married    Spouse Name: N/A    Number of Children: N/A  . Years of Education: N/A   Occupational History  . Not on file.   Social History Main Topics  . Smoking status: Former Smoker -- 3 years    Types: Cigarettes    Quit date: 08/17/1948  . Smokeless tobacco: Never Used  . Alcohol Use: No  . Drug Use: No  . Sexual Activity: Not on file   Other Topics Concern  . Not on file   Social History Narrative  . No narrative on file     ROS.: He denies angina, syncope, orthopnea, PND, claudication, syncope, and neurological complaints   Physical Exam:   Slender, elderly, but appearing younger than stated age  Chest is clear auscultation and percussion No JVD or carotid bruits Cardiac exam reveals an irregularly irregular rhythm. No obvious gallop is heard. Abdomen soft. No evidence of ascites. No tenderness is noted. Extremities reveal no edema. Neurological exam is grossly intact.  Labs: Lab Results  Component Value Date   WBC 8.4 03/27/2014   HGB 12.9* 03/27/2014   HCT 38.3* 03/27/2014   MCV 100.4* 03/27/2014   PLT 116.0* 03/27/2014    Recent Labs Lab 03/27/14 1113  NA 138  K 4.7  CL 102  CO2 26  BUN 18  CREATININE 0.9  CALCIUM 9.3  GLUCOSE 91   Lab Results  Component Value Date   CKTOTAL 39 05/10/2011   CKMB 2.2 05/10/2011   TROPONINI <0.30 05/10/2011     Radiology: CLINICAL DATA: Shortness of breath for 3 weeks, history of  asthmatic bronchitis  EXAM:  CHEST 2 VIEW  COMPARISON: Chest x-ray of 07/24/2011 and CT angio chest of  05/29/2011  FINDINGS:  No active infiltrate or effusion is seen. Mediastinal contours are  stable. The heart is mildly enlarged and stable. A permanent  pacemaker remains and median sternotomy sutures are  noted from prior  CABG.  IMPRESSION:  Stable cardiomegaly. No active lung disease. Permanent pacemaker  remains.  Electronically Signed  By: Ivar Drape M.D.  On: 11/14/2013 15:09  EKG:  No new data  ASSESSMENT: 1. Coronary artery disease with bypass grafting in 1987. The study is being done to exclude bypass graft failure 2. Persistent atrial fibrillation 3. Acute on chronic systolic heart failure with new LV  systolic dysfunction (EF less than 30%)  Plan:  Coronary angiography is being performed to exclude the possibility of bypass graft failure as the source of the patient's heart failure/LV dysfunction. The procedure and risks including stroke, death, myocardial infarction, limb ischemia, bleeding, allergy, kidney injury, among others were discussed in detail and except above the patient Sinclair Grooms 03/28/2014 8:04 AM

## 2014-03-29 NOTE — Telephone Encounter (Signed)
pt wife aware sa scheduler will call pt to set up sleep study and an appt with Dr.Bahnson @ [facility]..pt wife verbalized understanding.

## 2014-04-03 ENCOUNTER — Telehealth: Payer: Self-pay | Admitting: Interventional Cardiology

## 2014-04-03 ENCOUNTER — Ambulatory Visit (HOSPITAL_BASED_OUTPATIENT_CLINIC_OR_DEPARTMENT_OTHER): Payer: Medicare Other | Attending: Pulmonary Disease

## 2014-04-03 ENCOUNTER — Encounter: Payer: Self-pay | Admitting: *Deleted

## 2014-04-03 VITALS — Ht 68.0 in | Wt 160.0 lb

## 2014-04-03 DIAGNOSIS — G4733 Obstructive sleep apnea (adult) (pediatric): Secondary | ICD-10-CM | POA: Diagnosis not present

## 2014-04-03 DIAGNOSIS — G4710 Hypersomnia, unspecified: Secondary | ICD-10-CM | POA: Diagnosis present

## 2014-04-03 DIAGNOSIS — I4949 Other premature depolarization: Secondary | ICD-10-CM | POA: Insufficient documentation

## 2014-04-03 DIAGNOSIS — Z950 Presence of cardiac pacemaker: Secondary | ICD-10-CM | POA: Insufficient documentation

## 2014-04-03 DIAGNOSIS — G4737 Central sleep apnea in conditions classified elsewhere: Secondary | ICD-10-CM | POA: Insufficient documentation

## 2014-04-03 DIAGNOSIS — I482 Chronic atrial fibrillation, unspecified: Secondary | ICD-10-CM

## 2014-04-03 NOTE — Telephone Encounter (Signed)
CATH REPORT  SENT TO  DR  [name] AT  [facility].   [place]  NUMBER   [phone]

## 2014-04-03 NOTE — Telephone Encounter (Signed)
New message           [facility] wants the results of his cath from last week

## 2014-04-05 DIAGNOSIS — G4730 Sleep apnea, unspecified: Secondary | ICD-10-CM

## 2014-04-05 DIAGNOSIS — G4710 Hypersomnia, unspecified: Secondary | ICD-10-CM | POA: Diagnosis not present

## 2014-04-05 NOTE — Sleep Study (Signed)
   NAME: Adam [name] DATE OF BIRTH:  02/24/1925 MEDICAL RECORD NUMBER [id number]  LOCATION: [facility]  PHYSICIAN: Kathee Delton  DATE OF STUDY: 04/03/2014  SLEEP STUDY TYPE: Nocturnal Polysomnogram               REFERRING PHYSICIAN: [name] III, MD  INDICATION FOR STUDY: Hypersomnia with sleep apnea  EPWORTH SLEEPINESS SCORE:  3 HEIGHT: 5\' 8"  (172.7 cm)  WEIGHT: 160 lb (72.576 kg)    Body mass index is 24.33 kg/(m^2).  NECK SIZE: 14 in.  MEDICATIONS: Reviewed in the sleep record  SLEEP ARCHITECTURE: The patient has a total sleep time of 120 minutes, with no slow-wave sleep and only 2 minutes of REM. Sleep onset latency was normal at 29 minutes, and REM onset was mildly delayed at 129 minutes. Sleep efficiency was poor at 33%.  RESPIRATORY DATA: The patient was found to have 56 central apneas, 5 obstructive apneas, and 68 obstructive hypopneas, giving him an AHI of 65 events per hour. The events occurred primarily in the supine position, and there was moderate snoring noted throughout. The patient did not meet split-night criteria secondary to his very short total sleep time of only 120 minutes.  OXYGEN DATA: There was oxygen desaturation as low as 80% with the patient's obstructive events  CARDIAC DATA: The patient was noted to have a paced cardiac rhythm during the night, as well as a native rhythm without obvious pacer spikes that appeared to be regular.  There are also occasional PVCs  MOVEMENT/PARASOMNIA: The patient had no significant limb movements or other abnormal behaviors noted.  IMPRESSION/ RECOMMENDATION:    1) severe complex sleep apnea with both obstructive and central events, with an AHI of 65 events per hour and oxygen desaturation as low as 80%. Treatment for the patient's complex apnea will require either a bilevel device, or ASV device. A recent study showed an increased risk of death in patients wearing an ASV device who have an  ejection fraction less than 45% and chronic congestive heart failure with dyspnea.  The patient will either need to be scheduled for a bilevel or an ASV titration at the sleep Center with this being kept in mind.  2) paced rhythm, and as well as what appears to be a native rhythm that is regular along with occasional PVC.   Kathee Delton Diplomate, American Board of Sleep Medicine  ELECTRONICALLY SIGNED ON:  04/05/2014, 1:45 PM [place] PH: [phone]   FX: [phone] [place]

## 2014-04-11 ENCOUNTER — Ambulatory Visit (INDEPENDENT_AMBULATORY_CARE_PROVIDER_SITE_OTHER): Payer: Medicare Other | Admitting: Interventional Cardiology

## 2014-04-11 ENCOUNTER — Encounter: Payer: Self-pay | Admitting: Interventional Cardiology

## 2014-04-11 ENCOUNTER — Ambulatory Visit (INDEPENDENT_AMBULATORY_CARE_PROVIDER_SITE_OTHER): Payer: Medicare Other | Admitting: Pharmacist

## 2014-04-11 VITALS — BP 106/60 | HR 76 | Ht 68.0 in | Wt 161.8 lb

## 2014-04-11 DIAGNOSIS — Z7901 Long term (current) use of anticoagulants: Secondary | ICD-10-CM | POA: Diagnosis not present

## 2014-04-11 DIAGNOSIS — G4730 Sleep apnea, unspecified: Secondary | ICD-10-CM

## 2014-04-11 DIAGNOSIS — I4891 Unspecified atrial fibrillation: Secondary | ICD-10-CM | POA: Diagnosis not present

## 2014-04-11 DIAGNOSIS — I25810 Atherosclerosis of coronary artery bypass graft(s) without angina pectoris: Secondary | ICD-10-CM | POA: Diagnosis not present

## 2014-04-11 DIAGNOSIS — I482 Chronic atrial fibrillation, unspecified: Secondary | ICD-10-CM

## 2014-04-11 DIAGNOSIS — Z5181 Encounter for therapeutic drug level monitoring: Secondary | ICD-10-CM

## 2014-04-11 DIAGNOSIS — I5033 Acute on chronic diastolic (congestive) heart failure: Secondary | ICD-10-CM

## 2014-04-11 LAB — POCT INR: INR: 2.3

## 2014-04-11 MED ORDER — LISINOPRIL 2.5 MG PO TABS: 2.5000 mg | ORAL_TABLET | Freq: Every day | ORAL | Status: DC

## 2014-04-11 NOTE — Patient Instructions (Addendum)
Your physician has recommended you make the following change in your medication:  1) START Lisinopril 2.5mg  daily. An Rx has been sent to your pharmacy  Your physician recommends you have a Bipap Titration  Your physician recommends that you return for lab work in: 1 month (Bmet)  Your physician recommends that you schedule a follow-up appointment in: 1 month

## 2014-04-11 NOTE — Progress Notes (Signed)
Patient ID: Adam [name], male   DOB: Apr 28, 1925, 80 y.o.   [name]: [id number]    [address], [address] [place],   [address] Phone: [phone] Fax:  [phone]  Date:  04/11/2014   ID:  Adam [name], DOB 02-05-1925, [name] [id number]  PCP:  Horton Finer, MD   ASSESSMENT:  1. Atrial fibrillation, persistent 2. Sleep apnea, severe and mixed obstructive and central, characterized as severe by report 3. Severe SHF with EF 25% 4. chronic anticoagulation therapy   PLAN:  1. Sleep study with BiPAP titration. With low LVEF there is some risk of sudden death 2. F/u with Dr. [name] 3. Lisinopril 2.5 mg per day 4. Basic metabolic panel in [redacted] weeks along with an office visit   SUBJECTIVE: Adam [name] is a 80 y.o. male who has no complaints. Recent catheterization demonstrated widely patent coronaries. A recent sleep study revealed severe combined obstructive and central sleep apnea with the recommendation of bilevel positive airway pressure as management. He will need to have a titration study performed. He has not yet seen Dr. [name] since the evaluation has been completed. He is reluctant about having any additional studies done to   [facility] Readings from Last 3 Encounters:  04/11/14 161 lb 12.8 oz (73.392 kg)  04/03/14 160 lb (72.576 kg)  03/28/14 160 lb (72.576 kg)     Past Medical History  Diagnosis Date  . S/P CABG (coronary artery bypass graft)   . Hyperlipidemia   . GERD (gastroesophageal reflux disease)   . Presence of permanent cardiac pacemaker   . Chronic anticoagulation   . LBP (low back pain)   . AAA (abdominal aortic aneurysm)   . Diverticulosis   . Arthritis   . CAD (coronary artery disease)     [name] IS CARDIO  . PAF (paroxysmal atrial fibrillation)     Cardioversion June 2011 & IN 2012  . CHF (congestive heart failure)     Current Outpatient Prescriptions  Medication Sig Dispense Refill  . acetaminophen (TYLENOL) 650 MG CR tablet  Take 650 mg by mouth 2 (two) times daily.       [name] atorvastatin (LIPITOR) 10 MG tablet Take 10 mg by mouth daily.        . Cholecalciferol (VITAMIN D) 2000 UNITS CAPS Take 2,000 Units by mouth daily.      [name] dofetilide (TIKOSYN) 125 MCG capsule Take 1 capsule (125 mcg total) by mouth 2 (two) times daily.  60 capsule  10  . furosemide (LASIX) 40 MG tablet Take 1.5 tablets (60 mg total) by mouth daily. Take one daily and as directed  45 tablet  11  . latanoprost (XALATAN) 0.005 % ophthalmic solution Place 1 drop into both eyes at bedtime.       . metoprolol tartrate (LOPRESSOR) 25 MG tablet TAKE 1 TABLET BY MOUTH TWICE DAILY WITH FOOD  180 tablet  3  . Multiple Vitamins-Minerals (RA VISION-VITE PRESERVE PO) Take 1 tablet by mouth daily.       . nitroGLYCERIN (NITROSTAT) 0.4 MG SL tablet Place 0.4 mg under the tongue every 5 (five) minutes as needed. For chest pain      . Omega-3 Fatty Acids (FISH OIL) 1000 MG CPDR Take 1 capsule by mouth daily.       [name] omeprazole (PRILOSEC OTC) 20 MG tablet Take 20 mg by mouth daily as needed (acid reflux).       . silodosin (RAPAFLO) 8 MG CAPS capsule  Take 8 mg by mouth daily with breakfast.      . warfarin (COUMADIN) 5 MG tablet Take 5 mg by mouth daily. Take as directed by anticoagulation clinic       No current facility-administered medications for this visit.    Allergies:    Allergies  Allergen Reactions  . Morphine Nausea Only  . Amiodarone Other (See Comments) and Anxiety    Ended up in ER unable to breath and red  Ended up in ER unable to breath and red     Social History:  The patient  reports that he quit smoking about 65 years ago. His smoking use included Cigarettes. He smoked 0.00 packs per day for 3 years. He has never used smokeless tobacco. He reports that he does not drink alcohol or use illicit drugs.   ROS:  Please see the history of present illness.   Frightened and concerned about having more cardiovascular and sleep apnea related  testing   All other systems reviewed and negative.   OBJECTIVE: VS:  BP 106/60  Pulse 76  Ht 5\' 8"  (1.727 m)  Wt 161 lb 12.8 oz (73.392 kg)  BMI 24.61 kg/m2 Well nourished, well developed, in no acute distress, appears stated age of 39 HEENT: normal Neck: JVD flat. Carotid bruit absent  Cardiac:  normal S1, S2; RRR; no murmur Lungs:  clear to auscultation bilaterally, no wheezing, rhonchi or rales Abd: soft, nontender, no hepatomegaly Ext: Edema  Absent . Pulses 2+  Skin: warm and dry Neuro:  CNs 2-12 intact, no focal abnormalities noted  EKG:  Not repeat a       Signed, Illene Labrador III, MD 04/11/2014 9:30 AM

## 2014-04-30 ENCOUNTER — Encounter (HOSPITAL_BASED_OUTPATIENT_CLINIC_OR_DEPARTMENT_OTHER): Payer: Medicare Other

## 2014-04-30 DIAGNOSIS — I495 Sick sinus syndrome: Secondary | ICD-10-CM | POA: Diagnosis not present

## 2014-04-30 DIAGNOSIS — I25810 Atherosclerosis of coronary artery bypass graft(s) without angina pectoris: Secondary | ICD-10-CM | POA: Diagnosis not present

## 2014-04-30 DIAGNOSIS — Z5181 Encounter for therapeutic drug level monitoring: Secondary | ICD-10-CM | POA: Diagnosis not present

## 2014-04-30 DIAGNOSIS — G4733 Obstructive sleep apnea (adult) (pediatric): Secondary | ICD-10-CM | POA: Diagnosis not present

## 2014-04-30 DIAGNOSIS — Z950 Presence of cardiac pacemaker: Secondary | ICD-10-CM | POA: Diagnosis not present

## 2014-04-30 DIAGNOSIS — Z7901 Long term (current) use of anticoagulants: Secondary | ICD-10-CM | POA: Diagnosis not present

## 2014-04-30 DIAGNOSIS — I4891 Unspecified atrial fibrillation: Secondary | ICD-10-CM | POA: Diagnosis not present

## 2014-04-30 DIAGNOSIS — Z79899 Other long term (current) drug therapy: Secondary | ICD-10-CM | POA: Diagnosis not present

## 2014-05-01 ENCOUNTER — Other Ambulatory Visit: Payer: Self-pay | Admitting: Interventional Cardiology

## 2014-05-01 ENCOUNTER — Other Ambulatory Visit: Payer: Self-pay | Admitting: *Deleted

## 2014-05-02 ENCOUNTER — Ambulatory Visit (INDEPENDENT_AMBULATORY_CARE_PROVIDER_SITE_OTHER): Payer: Medicare Other | Admitting: Pharmacist

## 2014-05-02 DIAGNOSIS — I4891 Unspecified atrial fibrillation: Secondary | ICD-10-CM

## 2014-05-02 DIAGNOSIS — Z5181 Encounter for therapeutic drug level monitoring: Secondary | ICD-10-CM | POA: Diagnosis not present

## 2014-05-02 LAB — POCT INR: INR: 2.7

## 2014-05-03 ENCOUNTER — Telehealth: Payer: Self-pay | Admitting: Interventional Cardiology

## 2014-05-03 NOTE — Telephone Encounter (Signed)
New problem:   Pt's wife called about pt's Sleep Study 9/20 Sunday.   Pt would like to use the nasal tubes to be used in the study as well as the bypap. Per wife, Per the sleep center # [phone]  the DR has to authorize this.   Pt would like a call back when done.

## 2014-05-04 NOTE — Telephone Encounter (Signed)
pt wife aware [facility] sleep center aware ok for pt to have nasal tubes to be used for his sleep study on 9/20.spoke with Terri.pt wife verbalized understanding.

## 2014-05-06 ENCOUNTER — Ambulatory Visit (HOSPITAL_BASED_OUTPATIENT_CLINIC_OR_DEPARTMENT_OTHER): Payer: Medicare Other | Attending: Interventional Cardiology | Admitting: Radiology

## 2014-05-10 ENCOUNTER — Ambulatory Visit (INDEPENDENT_AMBULATORY_CARE_PROVIDER_SITE_OTHER): Payer: Medicare Other | Admitting: *Deleted

## 2014-05-10 ENCOUNTER — Encounter: Payer: Self-pay | Admitting: Internal Medicine

## 2014-05-10 DIAGNOSIS — I482 Chronic atrial fibrillation, unspecified: Secondary | ICD-10-CM

## 2014-05-10 DIAGNOSIS — I4891 Unspecified atrial fibrillation: Secondary | ICD-10-CM

## 2014-05-10 NOTE — Progress Notes (Signed)
Remote pacemaker transmission.   

## 2014-05-14 LAB — MDC_IDC_ENUM_SESS_TYPE_REMOTE
Battery Remaining Longevity: 125 mo
Brady Statistic AP VP Percent: 4 %
Brady Statistic AP VS Percent: 1 %
Brady Statistic AS VS Percent: 47 %
Date Time Interrogation Session: 20150924121833
Lead Channel Impedance Value: 501 Ohm
Lead Channel Pacing Threshold Pulse Width: 0.4 ms
Lead Channel Pacing Threshold Pulse Width: 0.4 ms
Lead Channel Sensing Intrinsic Amplitude: 11.2 mV
Lead Channel Sensing Intrinsic Amplitude: 2.8 mV
Lead Channel Setting Pacing Amplitude: 2 V
Lead Channel Setting Pacing Amplitude: 2.5 V
Lead Channel Setting Pacing Pulse Width: 0.4 ms
Lead Channel Setting Sensing Sensitivity: 4 mV
MDC IDC MSMT BATTERY IMPEDANCE: 100 Ohm
MDC IDC MSMT BATTERY VOLTAGE: 2.79 V
MDC IDC MSMT LEADCHNL RA IMPEDANCE VALUE: 476 Ohm
MDC IDC MSMT LEADCHNL RA PACING THRESHOLD AMPLITUDE: 0.5 V
MDC IDC MSMT LEADCHNL RV PACING THRESHOLD AMPLITUDE: 0.875 V
MDC IDC STAT BRADY AS VP PERCENT: 48 %

## 2014-05-16 ENCOUNTER — Telehealth: Payer: Self-pay | Admitting: Interventional Cardiology

## 2014-05-16 NOTE — Telephone Encounter (Signed)
pt has an appt with Dr.Bahnson @ [facility] on 10/19. pt sts that he did not go through with his sleep study and wanted to discuss that with Dr.Smith at his scheduled appt on 10/20. pt wants to jnow if Dr.Smith still thinks he needs to be seen on 10/20.adv pt I will fwd a message to Dr.Smith and call back with his response. Pt verbalized understanding.

## 2014-05-16 NOTE — Telephone Encounter (Signed)
New message      Pt has an appt at [facility] on 06-04-14.  Will [name] still want to see pt on 06-05-14?

## 2014-05-17 NOTE — Telephone Encounter (Signed)
It is okay for the patient to cancel the October 20 appointment with me. The sleep study is essential. I'm sure Dr. [name] will feel the same way.

## 2014-05-18 DIAGNOSIS — Z23 Encounter for immunization: Secondary | ICD-10-CM | POA: Diagnosis not present

## 2014-05-18 NOTE — Telephone Encounter (Signed)
Follow Up ° °Pt returning call from earlier. Please call. °

## 2014-05-18 NOTE — Telephone Encounter (Signed)
Returned pt call. 2nd attempt to give Dr.Smith recommendations.

## 2014-05-18 NOTE — Telephone Encounter (Signed)
called to give Dr.Smih recommendations.lmtcb

## 2014-05-21 ENCOUNTER — Encounter: Payer: Self-pay | Admitting: Cardiology

## 2014-05-23 ENCOUNTER — Ambulatory Visit (INDEPENDENT_AMBULATORY_CARE_PROVIDER_SITE_OTHER): Payer: Medicare Other | Admitting: Pharmacist

## 2014-05-23 DIAGNOSIS — I4891 Unspecified atrial fibrillation: Secondary | ICD-10-CM

## 2014-05-23 DIAGNOSIS — Z5181 Encounter for therapeutic drug level monitoring: Secondary | ICD-10-CM | POA: Diagnosis not present

## 2014-05-23 DIAGNOSIS — I482 Chronic atrial fibrillation, unspecified: Secondary | ICD-10-CM

## 2014-05-23 LAB — POCT INR: INR: 2.9

## 2014-06-03 ENCOUNTER — Encounter (HOSPITAL_BASED_OUTPATIENT_CLINIC_OR_DEPARTMENT_OTHER): Payer: Medicare Other

## 2014-06-04 DIAGNOSIS — I4891 Unspecified atrial fibrillation: Secondary | ICD-10-CM | POA: Diagnosis not present

## 2014-06-04 DIAGNOSIS — I495 Sick sinus syndrome: Secondary | ICD-10-CM | POA: Diagnosis not present

## 2014-06-04 DIAGNOSIS — Z950 Presence of cardiac pacemaker: Secondary | ICD-10-CM | POA: Diagnosis not present

## 2014-06-04 DIAGNOSIS — I481 Persistent atrial fibrillation: Secondary | ICD-10-CM | POA: Diagnosis not present

## 2014-06-04 DIAGNOSIS — R9431 Abnormal electrocardiogram [ECG] [EKG]: Secondary | ICD-10-CM | POA: Diagnosis not present

## 2014-06-04 DIAGNOSIS — Z45018 Encounter for adjustment and management of other part of cardiac pacemaker: Secondary | ICD-10-CM | POA: Diagnosis not present

## 2014-06-04 DIAGNOSIS — I071 Rheumatic tricuspid insufficiency: Secondary | ICD-10-CM | POA: Diagnosis not present

## 2014-06-05 ENCOUNTER — Ambulatory Visit (INDEPENDENT_AMBULATORY_CARE_PROVIDER_SITE_OTHER): Payer: Medicare Other | Admitting: Interventional Cardiology

## 2014-06-05 ENCOUNTER — Encounter: Payer: Self-pay | Admitting: Interventional Cardiology

## 2014-06-05 ENCOUNTER — Other Ambulatory Visit (INDEPENDENT_AMBULATORY_CARE_PROVIDER_SITE_OTHER): Payer: Medicare Other | Admitting: *Deleted

## 2014-06-05 VITALS — BP 120/58 | HR 49 | Ht 68.0 in | Wt 159.0 lb

## 2014-06-05 DIAGNOSIS — I480 Paroxysmal atrial fibrillation: Secondary | ICD-10-CM | POA: Diagnosis not present

## 2014-06-05 DIAGNOSIS — Z7901 Long term (current) use of anticoagulants: Secondary | ICD-10-CM | POA: Diagnosis not present

## 2014-06-05 DIAGNOSIS — I25810 Atherosclerosis of coronary artery bypass graft(s) without angina pectoris: Secondary | ICD-10-CM

## 2014-06-05 DIAGNOSIS — I5033 Acute on chronic diastolic (congestive) heart failure: Secondary | ICD-10-CM | POA: Diagnosis not present

## 2014-06-05 DIAGNOSIS — I119 Hypertensive heart disease without heart failure: Secondary | ICD-10-CM

## 2014-06-05 DIAGNOSIS — G4730 Sleep apnea, unspecified: Secondary | ICD-10-CM | POA: Insufficient documentation

## 2014-06-05 DIAGNOSIS — I5023 Acute on chronic systolic (congestive) heart failure: Secondary | ICD-10-CM

## 2014-06-05 LAB — BASIC METABOLIC PANEL
BUN: 25 mg/dL — ABNORMAL HIGH (ref 6–23)
CO2: 28 meq/L (ref 19–32)
CREATININE: 1.1 mg/dL (ref 0.4–1.5)
Calcium: 9.4 mg/dL (ref 8.4–10.5)
Chloride: 101 mEq/L (ref 96–112)
GFR: 67.24 mL/min (ref >60.00)
GLUCOSE: 93 mg/dL (ref 70–99)
Potassium: 4.4 mEq/L (ref 3.5–5.1)
Sodium: 139 mEq/L (ref 135–145)

## 2014-06-05 NOTE — Progress Notes (Signed)
Patient ID: Adam [name], male   DOB: [birth year], 80 y.o.   [name]: [id number]    [address], [address] [place], [place]  [address] Phone: [phone] Fax:  [phone]  Date:  06/05/2014   ID:  Adam [name], DOB 07-24-1925, [name] [id number]  PCP:  Horton Finer, MD   ASSESSMENT:  1. Persistent atrial fibrillation, now with rate control strategy 2. Systolic heart failure, acute on chronic, compensated 3. Chronic anticoagulation 4. Coronary atherosclerosis without angina 5. Hypertension, controlled  PLAN:  1. Continue to manipulate pacing parameters per Dr. Marrian Salvage 2. Clinical followup with me in 6 months 3. Questions answered concerning resynchronization therapy 4. Also discussed impact of obstructive sleep apnea on outcomes in patients with A. fib   SUBJECTIVE: Adam [name] is a 80 y.o. male who is doing well. Denies angina syncope. He exertional dyspnea is not limiting. There is no peripheral edema. He's had no bleeding on Coumadin.   Wt Readings from Last 3 Encounters:  06/05/14 159 lb (72.122 kg)  05/06/14 156 lb (70.761 kg)  04/11/14 161 lb 12.8 oz (73.392 kg)     Past Medical History  Diagnosis Date  . S/P CABG (coronary artery bypass graft)   . Hyperlipidemia   . GERD (gastroesophageal reflux disease)   . Presence of permanent cardiac pacemaker   . Chronic anticoagulation   . LBP (low back pain)   . AAA (abdominal aortic aneurysm)   . Diverticulosis   . Arthritis   . CAD (coronary artery disease)     [name] IS CARDIO  . PAF (paroxysmal atrial fibrillation)     Cardioversion June 2011 & IN 2012  . CHF (congestive heart failure)     Current Outpatient Prescriptions  Medication Sig Dispense Refill  . acetaminophen (TYLENOL) 650 MG CR tablet Take 650 mg by mouth 2 (two) times daily.       [name] atorvastatin (LIPITOR) 10 MG tablet Take 10 mg by mouth daily.        . Cholecalciferol (VITAMIN D) 2000 UNITS CAPS Take 2,000 Units by mouth  daily.      . metoprolol tartrate (LOPRESSOR) 25 MG tablet TAKE 1 TABLET BY MOUTH TWICE DAILY WITH FOOD  180 tablet  3  . Multiple Vitamins-Minerals (RA VISION-VITE PRESERVE PO) Take 1 tablet by mouth daily.       . nitroGLYCERIN (NITROSTAT) 0.4 MG SL tablet Place 0.4 mg under the tongue every 5 (five) minutes as needed. For chest pain      . Omega-3 Fatty Acids (FISH OIL) 1000 MG CPDR Take 1 capsule by mouth daily.       [name] omeprazole (PRILOSEC OTC) 20 MG tablet Take 20 mg by mouth daily as needed (acid reflux).       [name] omeprazole (PRILOSEC) 20 MG capsule Take by mouth.      . silodosin (RAPAFLO) 8 MG CAPS capsule Take by mouth.      . warfarin (COUMADIN) 5 MG tablet TAKE AS DIRECTED BY ANTICOAGULATION CLINIC  90 tablet  0   No current facility-administered medications for this visit.    Allergies:    Allergies  Allergen Reactions  . Morphine Nausea Only  . Amiodarone Other (See Comments) and Anxiety    Ended up in ER unable to breath and red  Ended up in ER unable to breath and red  Ended up in ER unable to breath and red  Ended up in ER unable to breath and red  Ended up in ER unable to breath and red     Social History:  The patient  reports that he quit smoking about 65 years ago. His smoking use included Cigarettes. He smoked 0.00 packs per day for 3 years. He has never used smokeless tobacco. He reports that he does not drink alcohol or use illicit drugs.   ROS:  Please see the history of present illness.   No blood in his urine or stool.   All other systems reviewed and negative.   OBJECTIVE: VS:  BP 120/58  Pulse 49  Ht 5\' 8"  (1.727 m)  Wt 159 lb (72.122 kg)  BMI 24.18 kg/m2  SpO2 82% Well nourished, well developed, in no acute distress, elderly and frail HEENT: normal Neck: JVD flat. Carotid bruit absent  Cardiac:  normal S1, S2; RRR; no murmur Lungs:  clear to auscultation bilaterally, no wheezing, rhonchi or rales Abd: soft, nontender, no hepatomegaly Ext: Edema  absent. Pulses 2+ Skin: warm and dry Neuro:  CNs 2-12 intact, no focal abnormalities noted  EKG:  Not performed       Signed, Illene Labrador III, MD 06/05/2014 8:32 AM

## 2014-06-05 NOTE — Patient Instructions (Signed)
Your physician recommends that you continue on your current medications as directed. Please refer to the Current Medication list given to you today.  Your physician wants you to follow-up in: 6 months. You will receive a reminder letter in the mail two months in advance. If you don't receive a letter, please call our office to schedule the follow-up appointment.  

## 2014-06-22 DIAGNOSIS — I495 Sick sinus syndrome: Secondary | ICD-10-CM | POA: Diagnosis not present

## 2014-06-22 DIAGNOSIS — I481 Persistent atrial fibrillation: Secondary | ICD-10-CM | POA: Diagnosis not present

## 2014-06-22 DIAGNOSIS — I4892 Unspecified atrial flutter: Secondary | ICD-10-CM | POA: Diagnosis not present

## 2014-06-22 DIAGNOSIS — Z950 Presence of cardiac pacemaker: Secondary | ICD-10-CM | POA: Diagnosis not present

## 2014-06-22 DIAGNOSIS — I4430 Unspecified atrioventricular block: Secondary | ICD-10-CM | POA: Diagnosis not present

## 2014-06-22 DIAGNOSIS — Z7901 Long term (current) use of anticoagulants: Secondary | ICD-10-CM | POA: Diagnosis not present

## 2014-06-22 DIAGNOSIS — I429 Cardiomyopathy, unspecified: Secondary | ICD-10-CM | POA: Diagnosis not present

## 2014-06-25 ENCOUNTER — Ambulatory Visit (INDEPENDENT_AMBULATORY_CARE_PROVIDER_SITE_OTHER): Payer: Medicare Other

## 2014-06-25 DIAGNOSIS — I4891 Unspecified atrial fibrillation: Secondary | ICD-10-CM | POA: Diagnosis not present

## 2014-06-25 DIAGNOSIS — Z5181 Encounter for therapeutic drug level monitoring: Secondary | ICD-10-CM

## 2014-06-25 DIAGNOSIS — I480 Paroxysmal atrial fibrillation: Secondary | ICD-10-CM

## 2014-06-25 LAB — POCT INR: INR: 2.2

## 2014-07-03 ENCOUNTER — Ambulatory Visit (INDEPENDENT_AMBULATORY_CARE_PROVIDER_SITE_OTHER): Payer: Medicare Other | Admitting: Internal Medicine

## 2014-07-03 ENCOUNTER — Encounter: Payer: Self-pay | Admitting: Internal Medicine

## 2014-07-03 VITALS — BP 138/62 | HR 71 | Ht 68.0 in | Wt 158.0 lb

## 2014-07-03 DIAGNOSIS — I482 Chronic atrial fibrillation, unspecified: Secondary | ICD-10-CM

## 2014-07-03 DIAGNOSIS — I25810 Atherosclerosis of coronary artery bypass graft(s) without angina pectoris: Secondary | ICD-10-CM

## 2014-07-03 DIAGNOSIS — Z45018 Encounter for adjustment and management of other part of cardiac pacemaker: Secondary | ICD-10-CM

## 2014-07-03 DIAGNOSIS — R001 Bradycardia, unspecified: Secondary | ICD-10-CM

## 2014-07-03 DIAGNOSIS — I509 Heart failure, unspecified: Secondary | ICD-10-CM

## 2014-07-03 DIAGNOSIS — I495 Sick sinus syndrome: Secondary | ICD-10-CM | POA: Diagnosis not present

## 2014-07-03 DIAGNOSIS — I5020 Unspecified systolic (congestive) heart failure: Secondary | ICD-10-CM

## 2014-07-03 LAB — MDC_IDC_ENUM_SESS_TYPE_INCLINIC
Brady Statistic RV Percent Paced: 23 %
Date Time Interrogation Session: 20151117145126
Lead Channel Impedance Value: 487 Ohm
Lead Channel Pacing Threshold Amplitude: 0.75 V
Lead Channel Pacing Threshold Pulse Width: 0.4 ms
Lead Channel Sensing Intrinsic Amplitude: 11.2 mV
Lead Channel Setting Pacing Pulse Width: 0.4 ms
MDC IDC MSMT BATTERY IMPEDANCE: 100 Ohm
MDC IDC MSMT BATTERY REMAINING LONGEVITY: 149 mo
MDC IDC MSMT BATTERY VOLTAGE: 2.79 V
MDC IDC MSMT LEADCHNL RA IMPEDANCE VALUE: 67 Ohm
MDC IDC SET LEADCHNL RV PACING AMPLITUDE: 2.5 V
MDC IDC SET LEADCHNL RV SENSING SENSITIVITY: 4 mV

## 2014-07-03 NOTE — Patient Instructions (Signed)
Remote monitoring is used to monitor your Pacemaker of ICD from home. This monitoring reduces the number of office visits required to check your device to one time per year. It allows [place] to keep an eye on the functioning of your device to ensure it is working properly. You are scheduled for a device check from home on 10/03/14. You may send your transmission at any time that day. If you have a wireless device, the transmission will be sent automatically. After your physician reviews your transmission, you will receive a postcard with your next transmission date.  Your physician wants you to follow-up in: 1 year with Dr. Caryl Comes. You will receive a reminder letter in the mail two months in advance. If you don't receive a letter, please call our office to schedule the follow-up appointment.  Your physician recommends that you continue on your current medications as directed. Please refer to the Current Medication list given to you today.

## 2014-07-03 NOTE — Progress Notes (Signed)
Patient Care Team: Horton Finer, MD as PCP - General (Internal Medicine) [name], MD (Cardiology) Sinclair Grooms, MD (Cardiology)   HPI  Adam [name] is a 80 y.o. 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  Recurrent atrial fibrillation in late March and underwent repeat cardioversion. He is been seen by Dr. Marjie Skiff at [facility]. Most recently he was seen October 2015 at which time because of persistent and now permanent atrial fibrillation a discussion was had regarding CRT upgrade.should note that rate response on his pacemaker was inactivated. He felt much better with this.  He has had some peripheral edema.  Echocardiogram [facility] 2015 demonstrated severe LV dysfunction with EF of 25%. This represents a severe interval decrease        Past Medical History  Diagnosis Date  . S/P CABG (coronary artery bypass graft)   . Hyperlipidemia   . GERD (gastroesophageal reflux disease)   . Presence of permanent cardiac pacemaker   . Chronic anticoagulation   . LBP (low back pain)   . AAA (abdominal aortic aneurysm)   . Diverticulosis   . Arthritis   . CAD (coronary artery disease)     [name]  . PAF (paroxysmal atrial fibrillation)     Cardioversion June 2011 & IN 2012  . CHF (congestive heart failure)     Past Surgical History  Procedure Laterality Date  . Coronary artery bypass graft  Feb 1989  . Permanent pacemaker  July 2008  . Tonsillectomy    . Cataract extraction    . Joint replacement  1995 & 1996    Bilateral total knee replacements  . Endovascular stent insertion  07/28/2011    Procedure: ENDOVASCULAR STENT GRAFT INSERTION;  Surgeon: Hinda Lenis, MD;  Location: [place];  Service:  Vascular;  Laterality: N/A;  Pt. on Coumadin/ instructed to hold 5 days prior to procedure  . Cardioversion  06/30/2012    Procedure: CARDIOVERSION;  Surgeon: Sinclair Grooms, MD;  Location: [place];  Service: Cardiovascular;  Laterality: N/A;  . Cardioversion N/A 11/23/2013    Procedure: CARDIOVERSION;  Surgeon: Sinclair Grooms, MD;  Location: [facility] ENDOSCOPY;  Service: Cardiovascular;  Laterality: N/A;    Current Outpatient Prescriptions  Medication Sig Dispense Refill  . acetaminophen (TYLENOL) 650 MG CR tablet Take 650 mg by mouth 2 (two) times daily.     [name] atorvastatin (LIPITOR) 10 MG tablet Take 10 mg by mouth daily.      . Cholecalciferol (VITAMIN D) 2000 UNITS CAPS Take 2,000 Units by mouth daily.    . furosemide (LASIX) 20 MG tablet Take 30 mg by mouth daily.     [name] lisinopril (PRINIVIL,ZESTRIL) 2.5 MG tablet Take 2.5 mg by mouth daily.    . metoprolol tartrate (LOPRESSOR) 25 MG tablet TAKE 1 TABLET BY MOUTH TWICE DAILY WITH FOOD 180 tablet 3  . Multiple Vitamins-Minerals (RA VISION-VITE PRESERVE PO) Take 1 tablet by mouth daily.     . nitroGLYCERIN (NITROSTAT) 0.4 MG SL tablet Place 0.4 mg under the tongue every 5 (five) minutes as needed. For chest pain    . Omega-3 Fatty Acids (FISH OIL) 1000 MG CPDR Take 1 capsule by mouth  daily.     . omeprazole (PRILOSEC OTC) 20 MG tablet Take 20 mg by mouth daily as needed.    [name] omeprazole (PRILOSEC) 20 MG capsule Take by mouth.    . silodosin (RAPAFLO) 8 MG CAPS capsule Take by mouth.    . warfarin (COUMADIN) 5 MG tablet TAKE AS DIRECTED BY ANTICOAGULATION CLINIC 90 tablet 0   No current facility-administered medications for this visit.    Allergies  Allergen Reactions  . Morphine Nausea Only  . Amiodarone Other (See Comments) and Anxiety    Ended up in ER unable to breath and red  Ended up in ER unable to breath and red  Ended up in ER unable to breath and red  Ended up in ER unable to breath and red  Ended up in ER unable to  breath and red     Review of Systems negative except from HPI and PMH  Physical Exam BP 138/62 mmHg  Ht 5\' 8"  (1.727 m)  Wt 158 lb (71.668 kg)  BMI 24.03 kg/m2 Well developed and well nourished in no acute distress HENT normal E scleral and icterus clear Neck Supple JVP 8 t; carotids brisk and full Clear to ausculation Regular rate and rhythm, no murmurs gallops or rub Soft with active bowel sounds No clubbing cyanosis 2+ Edema Alert and oriented, grossly normal motor and sensory function Skin Warm and Dry  ECG Afib  V pacing    Assessment and  Plan  bradycardia  Atrial fibrillation-permanent HFrEF  Pacemaker-[employer]  His atrial fibrillation has been rendered permanent. Heart rate control is moderately rightward and I suggested that he discuss with [facility] at his next visit the addition of digoxin and/or the up titration of his beta blocker. Blood pressure is sufficient to tolerate the latter.  He has some increased peripheral edema. This is relatively stable and the decision has been made to leave him on his current dose of diuretics.  Atrial fibrillation being permanent, rate control will be determining of neck steps including the consideration of AV junction ablation and CRT upgrade. Medication adjustments have been outlined above. We will see at his request Dr. [name] or Lilyan Gilford

## 2014-07-18 DIAGNOSIS — H3531 Nonexudative age-related macular degeneration: Secondary | ICD-10-CM | POA: Diagnosis not present

## 2014-07-18 DIAGNOSIS — H40053 Ocular hypertension, bilateral: Secondary | ICD-10-CM | POA: Diagnosis not present

## 2014-07-18 DIAGNOSIS — H2640 Unspecified secondary cataract: Secondary | ICD-10-CM | POA: Diagnosis not present

## 2014-07-18 DIAGNOSIS — H35363 Drusen (degenerative) of macula, bilateral: Secondary | ICD-10-CM | POA: Diagnosis not present

## 2014-07-18 DIAGNOSIS — H43813 Vitreous degeneration, bilateral: Secondary | ICD-10-CM | POA: Diagnosis not present

## 2014-07-18 DIAGNOSIS — Z961 Presence of intraocular lens: Secondary | ICD-10-CM | POA: Diagnosis not present

## 2014-07-23 DIAGNOSIS — R1030 Lower abdominal pain, unspecified: Secondary | ICD-10-CM | POA: Diagnosis not present

## 2014-07-23 DIAGNOSIS — I4891 Unspecified atrial fibrillation: Secondary | ICD-10-CM | POA: Diagnosis not present

## 2014-07-23 DIAGNOSIS — Z79899 Other long term (current) drug therapy: Secondary | ICD-10-CM | POA: Diagnosis not present

## 2014-07-23 DIAGNOSIS — R0981 Nasal congestion: Secondary | ICD-10-CM | POA: Diagnosis not present

## 2014-07-23 DIAGNOSIS — E785 Hyperlipidemia, unspecified: Secondary | ICD-10-CM | POA: Diagnosis not present

## 2014-07-23 DIAGNOSIS — R413 Other amnesia: Secondary | ICD-10-CM | POA: Diagnosis not present

## 2014-07-26 ENCOUNTER — Encounter (HOSPITAL_COMMUNITY): Payer: Self-pay | Admitting: Internal Medicine

## 2014-08-06 ENCOUNTER — Other Ambulatory Visit: Payer: Self-pay | Admitting: Interventional Cardiology

## 2014-08-06 ENCOUNTER — Ambulatory Visit (INDEPENDENT_AMBULATORY_CARE_PROVIDER_SITE_OTHER): Payer: Medicare Other | Admitting: *Deleted

## 2014-08-06 DIAGNOSIS — I4891 Unspecified atrial fibrillation: Secondary | ICD-10-CM | POA: Diagnosis not present

## 2014-08-06 DIAGNOSIS — Z5181 Encounter for therapeutic drug level monitoring: Secondary | ICD-10-CM

## 2014-08-06 DIAGNOSIS — I482 Chronic atrial fibrillation, unspecified: Secondary | ICD-10-CM

## 2014-08-06 LAB — POCT INR: INR: 3

## 2014-08-13 DIAGNOSIS — N401 Enlarged prostate with lower urinary tract symptoms: Secondary | ICD-10-CM | POA: Diagnosis not present

## 2014-08-13 DIAGNOSIS — N398 Other specified disorders of urinary system: Secondary | ICD-10-CM | POA: Diagnosis not present

## 2014-08-15 ENCOUNTER — Other Ambulatory Visit: Payer: Self-pay | Admitting: *Deleted

## 2014-08-15 MED ORDER — ATORVASTATIN CALCIUM 10 MG PO TABS: 10.0000 mg | ORAL_TABLET | Freq: Every day | ORAL | Status: DC

## 2014-08-21 ENCOUNTER — Telehealth: Payer: Self-pay

## 2014-08-21 NOTE — Telephone Encounter (Signed)
Pt wife called, states pt sent transmission in 08/13/2014, they received a notice he is due again in Feb. Would like to verify we received transmission in December. Please call and advise.

## 2014-08-21 NOTE — Telephone Encounter (Signed)
Spoke with wife and explained how we did receive his transmission but will not process it due to the date not being reset from last office visit.  Patient will keep his February remote date.  Wife is in agreement.

## 2014-08-24 DIAGNOSIS — Z950 Presence of cardiac pacemaker: Secondary | ICD-10-CM | POA: Diagnosis not present

## 2014-08-24 DIAGNOSIS — I481 Persistent atrial fibrillation: Secondary | ICD-10-CM | POA: Diagnosis not present

## 2014-08-24 DIAGNOSIS — I495 Sick sinus syndrome: Secondary | ICD-10-CM | POA: Diagnosis not present

## 2014-08-27 ENCOUNTER — Encounter: Payer: Self-pay | Admitting: Family

## 2014-08-28 ENCOUNTER — Ambulatory Visit (INDEPENDENT_AMBULATORY_CARE_PROVIDER_SITE_OTHER): Payer: Medicare Other | Admitting: Family

## 2014-08-28 ENCOUNTER — Ambulatory Visit (HOSPITAL_COMMUNITY)
Admission: RE | Admit: 2014-08-28 | Discharge: 2014-08-28 | Disposition: A | Discharge status: Home / Self Care | Payer: Medicare Other | Source: Ambulatory Visit | Attending: Family | Admitting: Family

## 2014-08-28 ENCOUNTER — Encounter: Payer: Self-pay | Admitting: Family

## 2014-08-28 VITALS — BP 114/57 | HR 52 | Resp 14 | Ht 68.0 in | Wt 160.0 lb

## 2014-08-28 DIAGNOSIS — Z48812 Encounter for surgical aftercare following surgery on the circulatory system: Secondary | ICD-10-CM | POA: Insufficient documentation

## 2014-08-28 DIAGNOSIS — I714 Abdominal aortic aneurysm, without rupture, unspecified: Secondary | ICD-10-CM

## 2014-08-28 DIAGNOSIS — Z95828 Presence of other vascular implants and grafts: Secondary | ICD-10-CM | POA: Diagnosis not present

## 2014-08-28 NOTE — Patient Instructions (Signed)
Abdominal Aortic Aneurysm An aneurysm is a weakened or damaged part of an artery wall that bulges from the normal force of blood pumping through the body. An abdominal aortic aneurysm is an aneurysm that occurs in the lower part of the aorta, the main artery of the body.  The major concern with an abdominal aortic aneurysm is that it can enlarge and burst (rupture) or blood can flow between the layers of the wall of the aorta through a tear (aorticdissection). Both of these conditions can cause bleeding inside the body and can be life threatening unless diagnosed and treated promptly. CAUSES  The exact cause of an abdominal aortic aneurysm is unknown. Some contributing factors are:   A hardening of the arteries caused by the buildup of fat and other substances in the lining of a blood vessel (arteriosclerosis).  Inflammation of the walls of an artery (arteritis).   Connective tissue diseases, such as Marfan syndrome.   Abdominal trauma.   An infection, such as syphilis or staphylococcus, in the wall of the aorta (infectious aortitis) caused by bacteria. RISK FACTORS  Risk factors that contribute to an abdominal aortic aneurysm may include:  Age older than 60 years.   High blood pressure (hypertension).  Male gender.  Ethnicity (white race).  Obesity.  Family history of aneurysm (first degree relatives only).  Tobacco use. PREVENTION  The following healthy lifestyle habits may help decrease your risk of abdominal aortic aneurysm:  Quitting smoking. Smoking can raise your blood pressure and cause arteriosclerosis.  Limiting or avoiding alcohol.  Keeping your blood pressure, blood sugar level, and cholesterol levels within normal limits.  Decreasing your salt intake. In somepeople, too much salt can raise blood pressure and increase your risk of abdominal aortic aneurysm.  Eating a diet low in saturated fats and cholesterol.  Increasing your fiber intake by including  whole grains, vegetables, and fruits in your diet. Eating these foods may help lower blood pressure.  Maintaining a healthy weight.  Staying physically active and exercising regularly. SYMPTOMS  The symptoms of abdominal aortic aneurysm may vary depending on the size and rate of growth of the aneurysm.Most grow slowly and do not have any symptoms. When symptoms do occur, they may include:  Pain (abdomen, side, lower back, or groin). The pain may vary in intensity. A sudden onset of severe pain may indicate that the aneurysm has ruptured.  Feeling full after eating only small amounts of food.  Nausea or vomiting or both.  Feeling a pulsating lump in the abdomen.  Feeling faint or passing out. DIAGNOSIS  Since most unruptured abdominal aortic aneurysms have no symptoms, they are often discovered during diagnostic exams for other conditions. An aneurysm may be found during the following procedures:  Ultrasonography (A one-time screening for abdominal aortic aneurysm by ultrasonography is also recommended for all men aged 65-75 years who have ever smoked).  X-ray exams.  A computed tomography (CT).  Magnetic resonance imaging (MRI).  Angiography or arteriography. TREATMENT  Treatment of an abdominal aortic aneurysm depends on the size of your aneurysm, your age, and risk factors for rupture. Medication to control blood pressure and pain may be used to manage aneurysms smaller than 6 cm. Regular monitoring for enlargement may be recommended by your caregiver if:  The aneurysm is 3-4 cm in size (an annual ultrasonography may be recommended).  The aneurysm is 4-4.5 cm in size (an ultrasonography every 6 months may be recommended).  The aneurysm is larger than 4.5 cm in   size (your caregiver may ask that you be examined by a vascular surgeon). If your aneurysm is larger than 6 cm, surgical repair may be recommended. There are two main methods for repair of an aneurysm:   Endovascular  repair (a minimally invasive surgery). This is done most often.  Open repair. This method is used if an endovascular repair is not possible. Document Released: 05/13/2005 Document Revised: 11/28/2012 Document Reviewed: 09/02/2012 ExitCare Patient Information 2015 [employer]. This information is not intended to replace advice given to you by your health care provider. Make sure you discuss any questions you have with your health care provider.  

## 2014-08-28 NOTE — Progress Notes (Signed)
[facility] [place]  Established EVAR  History of Present Illness  Adam [name] is a 81 y.o. (03-19-1925) male patient of Dr. Bridgett Larsson who is s/p EVAR (Date: 07/28/11) presents for routine follow up. Most recent EVAR duplex (Date: 02/03/13) demonstrates: no endoleak and decreaing sac size.  He had a new pacemaker inserted at [facility] about October 2015. He takes a daily statin and coumadin. He denies abdominal pain, reports mild chronic back pain which he attributes to osteoarthritis, denies any new back pain.   Pt Diabetic: "borderline" Pt smoker: non-smoker   Past Medical History  Diagnosis Date  . S/P CABG (coronary artery bypass graft)   . Hyperlipidemia   . GERD (gastroesophageal reflux disease)   . Presence of permanent cardiac pacemaker   . Chronic anticoagulation   . LBP (low back pain)   . AAA (abdominal aortic aneurysm)   . Diverticulosis   . Arthritis   . 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 Permanent atrial fibrillation     decision made at [facility]  . CHF (congestive heart failure)    Past Surgical History  Procedure Laterality Date  . Coronary artery bypass graft  Feb 1989  . Permanent pacemaker  July 2008  . Tonsillectomy    . Cataract extraction    . Joint replacement  1995 & 1996    Bilateral total knee replacements  . Endovascular stent insertion  07/28/2011    Procedure: ENDOVASCULAR STENT GRAFT INSERTION;  Surgeon: Hinda Lenis, MD;  Location: [place];  Service: Vascular;  Laterality: N/A;  Pt. on Coumadin/ instructed to hold 5 days prior to procedure  . Cardioversion  06/30/2012    Procedure: CARDIOVERSION;  Surgeon: Sinclair Grooms, MD;  Location: [place];  Service: Cardiovascular;  Laterality: N/A;  . Cardioversion N/A 11/23/2013    Procedure: CARDIOVERSION;  Surgeon: Sinclair Grooms, MD;  Location: [facility] ENDOSCOPY;  Service: Cardiovascular;  Laterality: N/A;  . Permanent pacemaker generator change N/A  11/03/2013    Procedure: PERMANENT PACEMAKER GENERATOR CHANGE;  Surgeon: [name], MD;  Location: [facility] CATH LAB;  Service: Cardiovascular;  Laterality: N/A;  . Left heart catheterization with coronary angiogram N/A 03/28/2014    Procedure: LEFT HEART CATHETERIZATION WITH CORONARY ANGIOGRAM;  Surgeon: Sinclair Grooms, MD;  Location: [facility] CATH LAB;  Service: Cardiovascular;  Laterality: N/A;   Social History History  Substance Use Topics  . Smoking status: Former Smoker -- 3 years    Types: Cigarettes    Quit date: 08/17/1948  . Smokeless tobacco: Never Used  . Alcohol Use: No   Family History Family History  Problem Relation Age of Onset  . Stroke Mother   . Heart failure Mother   . Heart attack Father   . Heart disease Father   . Cancer Sister     breast  . Other Sister     DJD  . Heart disease Sister   . Heart disease Sister    Current Outpatient Prescriptions on File Prior to Visit  Medication Sig Dispense Refill  . acetaminophen (TYLENOL) 650 MG CR tablet Take 650 mg by mouth 2 (two) times daily.     [name] atorvastatin (LIPITOR) 10 MG tablet Take 1 tablet (10 mg total) by mouth daily. 90 tablet 4  . Cholecalciferol (VITAMIN D) 2000 UNITS CAPS Take 2,000 Units by mouth daily.    . furosemide (LASIX) 20 MG tablet Take 30  mg by mouth daily.     [name] lisinopril (PRINIVIL,ZESTRIL) 2.5 MG tablet Take 2.5 mg by mouth daily.    . metoprolol tartrate (LOPRESSOR) 25 MG tablet TAKE 1 TABLET BY MOUTH TWICE DAILY WITH FOOD 180 tablet 3  . Multiple Vitamins-Minerals (RA VISION-VITE PRESERVE PO) Take 1 tablet by mouth daily.     . nitroGLYCERIN (NITROSTAT) 0.4 MG SL tablet Place 0.4 mg under the tongue every 5 (five) minutes as needed. For chest pain    . Omega-3 Fatty Acids (FISH OIL) 1000 MG CPDR Take 1 capsule by mouth daily.     [name] omeprazole (PRILOSEC OTC) 20 MG tablet Take 20 mg by mouth daily as needed.    . silodosin (RAPAFLO) 8 MG CAPS capsule Take by mouth.    . warfarin (COUMADIN) 5  MG tablet TAKE AS DIRECTED BY ANTICOAGULATION CLINIC 90 tablet 1  . omeprazole (PRILOSEC) 20 MG capsule Take by mouth.     No current facility-administered medications on file prior to visit.   Allergies  Allergen Reactions  . Morphine Nausea Only  . Amiodarone Other (See Comments) and Anxiety    Ended up in ER unable to breath and red  Ended up in ER unable to breath and red  Ended up in ER unable to breath and red  Ended up in ER unable to breath and red  Ended up in ER unable to breath and red      ROS: See HPI for pertinent positives and negatives.  Physical Examination  Filed Vitals:   08/28/14 0918  BP: 114/57  Pulse: 52  Resp: 14  Height: 5\' 8"  (1.727 m)  Weight: 160 lb (72.576 kg)  SpO2: 99%   Body mass index is 24.33 kg/(m^2).  General: A&O x 3, WDWN   Eyes: Pupils equal  Pulmonary: Sym exp, good air movt, CTAB, no rales, rhonchi, & wheezing   Cardiac: RRR, Nl S1, S2, no Murmurs appreciated.   Vascular:  Vessel  Right  Left   Radial  1+Palpable  2+Palpable   Carotid  Palpable, without bruit  Palpable, without bruit   Aorta  Not palpable  N/A   Femoral  2+Palpable  2+Palpable   Popliteal  Not palpable  Not palpable   PT  Not Palpable  Not Palpable   DP  Faintly Palpable  Faintly Palpable    Gastrointestinal: soft, NTND, -G/R, - HSM, - palpable masses, - CVAT B   Musculoskeletal: M/S 5/5 throughout , Extremities without ischemic changes Pretibial pitting edema: 1+ right, trace left.   Neurologic: Pain and light touch intact in extremities , Motor exam as listed above. CN 2-12 grossly intact.  Non-Invasive Vascular Imaging  EVAR Duplex (Date: 08/28/2014) ABDOMINAL AORTA DUPLEX EVALUATION - POST ENDOVASCULAR REPAIR    INDICATION: Follow-up endovascular aortic repair     PREVIOUS INTERVENTION(S): Endovascular aortic stent graft placed 07/28/2011    DUPLEX EXAM:      DIAMETER AP (cm) DIAMETER TRANSVERSE (cm)  VELOCITIES (cm/sec)  Aorta 4.54 4.56 130  Right Common Iliac 1.04 1.03 145  Left Common Iliac 1.08 1.06 222    Comparison Study  [place]     Date DIAMETER AP (cm) DIAMETER TRANSVERSE (cm)  02/03/2013 4.39 4.34  ADDITIONAL FINDINGS:     IMPRESSION: 1. Widely patent aortic stent graft without evidence of restenosis or hyperplasia.  2. Residual aneurysmal sac measurement is slightly increased compared to previous exam. Evaluation for endoleak cannot be completed due to bowel gas.  3. Elevated velocities  are observed at the distal graft of the left limb. No significant stenosis is observed; however, the vessel is tortuous in this area.    Compared to the previous exam:  Slight increase in residual aneurysmal sac measurement.       CTA Abd/pelvis (08/25/2013) (review of records)  Stable aortic stent graft repair of the infrarenal abdominal aortic aneurysm.   Stable small type 2 endoleak.   Stable native aneurysm sac diameter.   Medical Decision Making  AMEAR STROJNY is a 81 y.o. male who presents s/p EVAR (Date: 07/28/11).  Pt is asymptomatic with a 0.17 cm increase in sac size in seven months. Widely patent aortic stent graft without evidence of restenosis or hyperplasia.  Residual aneurysmal sac measurement is slightly increased compared to previous exam. Evaluation for endoleak cannot be completed due to bowel gas.  Elevated velocities are observed at the distal graft of the left limb. No significant stenosis is observed; however, the vessel is tortuous in this area.  Face to face time with patient was 25 minutes. Over 50% of this time was spent on counseling and coordination of care.   Based on Dr. Lianne Moris review of this patient's CTA on 08/25/2013, his aneurysm sac was unchanged. The EVAR remained in good infrarenal position without evidence of sac size enlargement or change in morphology. Dr. Bridgett Larsson noted a faint impression of possible posterior Type II endoleak. Both iliac limbs  were widely patent.   9 out 10 Type II endoleaks spontaneously thrombose. Since he is on coumadin, this probably is lower in probability, but I doubt any benefit to trying to treat the endoleak as the sac is completely stable, suggesting minimal pressurization of the sac.   I discussed with the patient the importance of surveillance of the endograft.   Based on today's EVAR Duplex, and after discussing with Dr. Donnetta Hutching, the pt will return in 6 months for CTA  Abd/pelvis for EVAR evaluation and follow up with Dr. Bridgett Larsson.  I emphasized the importance of maximal medical management including strict control of blood pressure, blood glucose, and lipid levels, antiplatelet agents, obtaining regular exercise, and cessation of smoking.   The patient was given information about AAA including signs, symptoms, treatment, and how to minimize the risk of enlargement and rupture of aneurysms.    Thank you for allowing [place] to participate in this patient's care.  Clemon Chambers, RN, MSN, FNP-C [facility] [place] Office: [phone]  Clinic Physician: Early  08/28/2014, 9:15 AM

## 2014-09-11 ENCOUNTER — Telehealth: Payer: Self-pay | Admitting: Internal Medicine

## 2014-09-11 NOTE — Telephone Encounter (Signed)
New Msg         Dr. Teena Dunk, EP from [facility] calling in regards to pt. No details given except pt requested for him to contact Dr. Caryl Comes.   Please return call to [phone].

## 2014-09-13 ENCOUNTER — Telehealth: Payer: Self-pay | Admitting: Internal Medicine

## 2014-09-13 NOTE — Telephone Encounter (Signed)
New Message     Please have Dr. Caryl Comes call Dr. Nancy Fetter he has left messages and needs to speak with Dr about Patient.

## 2014-09-14 NOTE — Telephone Encounter (Signed)
Dr. Francena Hanly called Dr. Nancy Fetter and left message to call him back on personal cell phone.

## 2014-09-17 ENCOUNTER — Ambulatory Visit (INDEPENDENT_AMBULATORY_CARE_PROVIDER_SITE_OTHER): Payer: Medicare Other

## 2014-09-17 DIAGNOSIS — I4891 Unspecified atrial fibrillation: Secondary | ICD-10-CM

## 2014-09-17 DIAGNOSIS — Z5181 Encounter for therapeutic drug level monitoring: Secondary | ICD-10-CM

## 2014-09-17 LAB — POCT INR: INR: 2

## 2014-10-03 ENCOUNTER — Ambulatory Visit (INDEPENDENT_AMBULATORY_CARE_PROVIDER_SITE_OTHER): Payer: Medicare Other | Admitting: *Deleted

## 2014-10-03 ENCOUNTER — Other Ambulatory Visit: Payer: Self-pay | Admitting: Internal Medicine

## 2014-10-03 DIAGNOSIS — I495 Sick sinus syndrome: Secondary | ICD-10-CM

## 2014-10-03 NOTE — Progress Notes (Signed)
Remote pacemaker transmission.   

## 2014-10-04 LAB — MDC_IDC_ENUM_SESS_TYPE_REMOTE
Battery Voltage: 2.79 V
Brady Statistic RV Percent Paced: 8 %
Date Time Interrogation Session: 20160217134212
Lead Channel Impedance Value: 492 Ohm
Lead Channel Impedance Value: 67 Ohm
Lead Channel Setting Pacing Amplitude: 2.5 V
Lead Channel Setting Sensing Sensitivity: 4 mV
MDC IDC MSMT BATTERY IMPEDANCE: 104 Ohm
MDC IDC MSMT BATTERY REMAINING LONGEVITY: 154 mo
MDC IDC MSMT LEADCHNL RV PACING THRESHOLD AMPLITUDE: 1.125 V
MDC IDC MSMT LEADCHNL RV PACING THRESHOLD PULSEWIDTH: 0.4 ms
MDC IDC MSMT LEADCHNL RV SENSING INTR AMPL: 8 mV
MDC IDC SET LEADCHNL RV PACING PULSEWIDTH: 0.4 ms

## 2014-10-24 ENCOUNTER — Encounter: Payer: Self-pay | Admitting: *Deleted

## 2014-10-24 NOTE — Telephone Encounter (Signed)
Dr. Caryl Comes reports yesterday that this has been handled.

## 2014-10-26 ENCOUNTER — Encounter: Payer: Self-pay | Admitting: Internal Medicine

## 2014-10-30 ENCOUNTER — Ambulatory Visit (INDEPENDENT_AMBULATORY_CARE_PROVIDER_SITE_OTHER): Payer: Medicare Other | Admitting: *Deleted

## 2014-10-30 DIAGNOSIS — Z5181 Encounter for therapeutic drug level monitoring: Secondary | ICD-10-CM

## 2014-10-30 DIAGNOSIS — K4090 Unilateral inguinal hernia, without obstruction or gangrene, not specified as recurrent: Secondary | ICD-10-CM | POA: Diagnosis not present

## 2014-10-30 DIAGNOSIS — R1030 Lower abdominal pain, unspecified: Secondary | ICD-10-CM | POA: Diagnosis not present

## 2014-10-30 DIAGNOSIS — I4891 Unspecified atrial fibrillation: Secondary | ICD-10-CM

## 2014-10-30 LAB — POCT INR: INR: 2.3

## 2014-11-02 DIAGNOSIS — M25561 Pain in right knee: Secondary | ICD-10-CM | POA: Diagnosis not present

## 2014-11-02 DIAGNOSIS — M25551 Pain in right hip: Secondary | ICD-10-CM | POA: Diagnosis not present

## 2014-11-02 DIAGNOSIS — M25562 Pain in left knee: Secondary | ICD-10-CM | POA: Diagnosis not present

## 2014-11-06 ENCOUNTER — Encounter: Payer: Medicare Other | Admitting: Internal Medicine

## 2014-11-22 DIAGNOSIS — E785 Hyperlipidemia, unspecified: Secondary | ICD-10-CM | POA: Diagnosis not present

## 2014-11-22 DIAGNOSIS — I10 Essential (primary) hypertension: Secondary | ICD-10-CM | POA: Diagnosis not present

## 2014-11-22 DIAGNOSIS — R1030 Lower abdominal pain, unspecified: Secondary | ICD-10-CM | POA: Diagnosis not present

## 2014-11-22 DIAGNOSIS — Z0001 Encounter for general adult medical examination with abnormal findings: Secondary | ICD-10-CM | POA: Diagnosis not present

## 2014-11-26 DIAGNOSIS — Z0001 Encounter for general adult medical examination with abnormal findings: Secondary | ICD-10-CM | POA: Diagnosis not present

## 2014-11-26 DIAGNOSIS — Z23 Encounter for immunization: Secondary | ICD-10-CM | POA: Diagnosis not present

## 2014-11-26 DIAGNOSIS — E785 Hyperlipidemia, unspecified: Secondary | ICD-10-CM | POA: Diagnosis not present

## 2014-11-26 DIAGNOSIS — I10 Essential (primary) hypertension: Secondary | ICD-10-CM | POA: Diagnosis not present

## 2014-11-28 ENCOUNTER — Telehealth: Payer: Self-pay

## 2014-11-28 DIAGNOSIS — R1030 Lower abdominal pain, unspecified: Secondary | ICD-10-CM

## 2014-11-28 DIAGNOSIS — Z95828 Presence of other vascular implants and grafts: Secondary | ICD-10-CM

## 2014-11-28 DIAGNOSIS — R1909 Other intra-abdominal and pelvic swelling, mass and lump: Secondary | ICD-10-CM

## 2014-11-28 NOTE — Telephone Encounter (Signed)
Discussed symptoms with Dr. Scot Dock.  Recommended to schedule appt. for Abdominal U/S and Duplex of the bilateral groin, and to see Dr. Bridgett Larsson.  Notified wife of plan.  Will call back with appt. inform.

## 2014-11-28 NOTE — Telephone Encounter (Signed)
Spoke with patients wife to schedule, dpm

## 2014-11-28 NOTE — Telephone Encounter (Signed)
Phone call from pt's wife.  Reported approx. 2 mo. hx of swelling bilateral groin, and intermittent pain.  Reported his PCP recommended to start physical therapy for "muscle strain."  Stated Dr. Percell Miller, Orthopedic Spec. Ordered an xray, and voiced concern of either hernia or involvement of the Stent Graft Repair of the AAA.  Reported that pt. has noticed the pain in the groin seems to be provoked with sitting for prolonged intervals. Also stated the left groin discomfort is worse than the right groin.  Denies any fever or chills.  Stated that the pt. is able to exercise at the [facility] if he is careful.  Wife questioned if Dr. Bridgett Larsson would want to evaluate him sooner than his f/u in July, to determine if there is any problem with the previously treated aneurysm.

## 2014-12-03 ENCOUNTER — Encounter: Payer: Self-pay | Admitting: Interventional Cardiology

## 2014-12-03 ENCOUNTER — Ambulatory Visit (INDEPENDENT_AMBULATORY_CARE_PROVIDER_SITE_OTHER): Payer: Medicare Other | Admitting: *Deleted

## 2014-12-03 ENCOUNTER — Ambulatory Visit (INDEPENDENT_AMBULATORY_CARE_PROVIDER_SITE_OTHER): Payer: Medicare Other | Admitting: Interventional Cardiology

## 2014-12-03 VITALS — BP 126/58 | HR 60 | Ht 68.0 in | Wt 162.0 lb

## 2014-12-03 DIAGNOSIS — I4891 Unspecified atrial fibrillation: Secondary | ICD-10-CM

## 2014-12-03 DIAGNOSIS — I5022 Chronic systolic (congestive) heart failure: Secondary | ICD-10-CM

## 2014-12-03 DIAGNOSIS — I25810 Atherosclerosis of coronary artery bypass graft(s) without angina pectoris: Secondary | ICD-10-CM | POA: Diagnosis not present

## 2014-12-03 DIAGNOSIS — I480 Paroxysmal atrial fibrillation: Secondary | ICD-10-CM

## 2014-12-03 DIAGNOSIS — Z7901 Long term (current) use of anticoagulants: Secondary | ICD-10-CM

## 2014-12-03 DIAGNOSIS — Z950 Presence of cardiac pacemaker: Secondary | ICD-10-CM

## 2014-12-03 DIAGNOSIS — Z951 Presence of aortocoronary bypass graft: Secondary | ICD-10-CM | POA: Diagnosis not present

## 2014-12-03 DIAGNOSIS — Z5181 Encounter for therapeutic drug level monitoring: Secondary | ICD-10-CM

## 2014-12-03 DIAGNOSIS — I119 Hypertensive heart disease without heart failure: Secondary | ICD-10-CM | POA: Diagnosis not present

## 2014-12-03 DIAGNOSIS — K4090 Unilateral inguinal hernia, without obstruction or gangrene, not specified as recurrent: Secondary | ICD-10-CM

## 2014-12-03 LAB — POCT INR: INR: 2.6

## 2014-12-03 NOTE — Progress Notes (Signed)
Cardiology Office Note   Date:  12/03/2014   ID:  Adam [name], DOB 1925/01/25, [name] [id number]  PCP:  Adam Battiest, MD  Cardiologist:   Adam Grooms, MD   Chief Complaint  Patient presents with  . Congestive Heart Failure      History of Present Illness: Adam [name] is a 81 y.o. male who presents for coronary artery disease with prior bypass grafting, chronic atrial fibrillation with rate control, chronic anticoagulation, hypertension, and abdominal aortic aneurysm.  The patient denies dyspnea and angina. He denies syncope. There is no edema. He complains now of right inguinal discomfort that is worse when sitting, and standing but goes away with lying. Several weeks ago he was stretching and had a tear in his right inguinal area and since that time the discomfort has been there. He feels a bulge in this area.    Past Medical History  Diagnosis Date  . S/P CABG (coronary artery bypass graft)   . Hyperlipidemia   . GERD (gastroesophageal reflux disease)   . Presence of permanent cardiac pacemaker   . Chronic anticoagulation   . LBP (low back pain)   . AAA (abdominal aortic aneurysm)   . Diverticulosis   . Arthritis   . 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 Permanent atrial fibrillation     decision made at [facility]  . CHF (congestive heart failure)     Past Surgical History  Procedure Laterality Date  . Coronary artery bypass graft  Feb 1989  . Permanent pacemaker  July 2008  . Tonsillectomy    . Cataract extraction    . Joint replacement  1995 & 1996    Bilateral total knee replacements  . Endovascular stent insertion  07/28/2011    Procedure: ENDOVASCULAR STENT GRAFT INSERTION;  Surgeon: Hinda Lenis, MD;  Location: [place];  Service: Vascular;  Laterality: N/A;  Pt. on Coumadin/ instructed to hold 5 days prior to procedure  . Cardioversion  06/30/2012    Procedure: CARDIOVERSION;  Surgeon: Adam Grooms, MD;  Location: [place];  Service: Cardiovascular;  Laterality: N/A;  . Cardioversion N/A 11/23/2013    Procedure: CARDIOVERSION;  Surgeon: Adam Grooms, MD;  Location: [facility];  Service: Cardiovascular;  Laterality: N/A;  . Permanent pacemaker generator change N/A 11/03/2013    Procedure: PERMANENT PACEMAKER GENERATOR CHANGE;  Surgeon: [name], MD;  Location: [facility] CATH LAB;  Service: Cardiovascular;  Laterality: N/A;  . Left heart catheterization with coronary angiogram N/A 03/28/2014    Procedure: LEFT HEART CATHETERIZATION WITH CORONARY ANGIOGRAM;  Surgeon: Adam Grooms, MD;  Location: [facility] CATH LAB;  Service: Cardiovascular;  Laterality: N/A;  . Eye surgery       Current Outpatient Prescriptions  Medication Sig Dispense Refill  . acetaminophen (TYLENOL) 650 MG CR tablet Take 650 mg by mouth every 8 (eight) hours as needed.     [name] atorvastatin (LIPITOR) 10 MG tablet Take 1 tablet (10 mg total) by mouth daily. 90 tablet 4  . Cholecalciferol (VITAMIN D) 2000 UNITS CAPS Take 2,000 Units by mouth daily.    . furosemide (LASIX) 40 MG tablet Take 40 mg by mouth daily.    [name] lisinopril (PRINIVIL,ZESTRIL) 2.5 MG tablet Take 2.5 mg by mouth daily.    . metoprolol tartrate (LOPRESSOR) 25 MG tablet TAKE 1 TABLET BY MOUTH TWICE DAILY WITH FOOD 180 tablet 3  . Multiple Vitamins-Minerals (  RA VISION-VITE PRESERVE PO) Take 1 tablet by mouth daily.     . Multiple Vitamins-Minerals (VISION-VITE PRESERVE PO) Take 2 capsules by mouth daily.    . nitroGLYCERIN (NITROSTAT) 0.4 MG SL tablet Place 0.4 mg under the tongue every 5 (five) minutes as needed. For chest pain    . Omega-3 Fatty Acids (FISH OIL) 1000 MG CPDR Take 1 capsule by mouth daily.     [name] omeprazole (PRILOSEC OTC) 20 MG tablet Take 20 mg by mouth daily as needed.    [name] omeprazole (PRILOSEC) 20 MG capsule Take by mouth.    . silodosin (RAPAFLO) 8 MG CAPS capsule Take by mouth.    . warfarin (COUMADIN) 5 MG tablet TAKE AS DIRECTED BY ANTICOAGULATION  CLINIC 90 tablet 1   No current facility-administered medications for this visit.    Allergies:   Morphine and Amiodarone    Social History:  The patient  reports that he quit smoking about 66 years ago. His smoking use included Cigarettes. He quit after 3 years of use. He has never used smokeless tobacco. He reports that he does not drink alcohol or use illicit drugs.   Family History:  The patient's family history includes Cancer in his sister; Heart attack in his father; Heart disease in his father and sister; Heart failure in his mother; Other in his sister; Stroke in his mother; Varicose Veins in his mother.    ROS:  Please see the history of present illness.   Otherwise, review of systems are positive for constipation, abdominal pain, back discomfort, muscle pain, easy bruising, and fatigue..   All other systems are reviewed and negative.    PHYSICAL EXAM: VS:  BP 126/58 mmHg  Pulse 60  Ht 5\' 8"  (1.727 m)  Wt 162 lb (73.483 kg)  BMI 24.64 kg/m2 , BMI Body mass index is 24.64 kg/(m^2). GEN: Well nourished, well developed, in no acute distress HEENT: normal Neck: no JVD, carotid bruits, or masses Cardiac: RRR; no murmurs, rubs, or gallops,no edema  Respiratory:  clear to auscultation bilaterally, normal work of breathing GI: soft, nontender, nondistended, + BS MS: no deformity or atrophy Skin: warm and dry, no rash Neuro:  Strength and sensation are intact Psych: euthymic mood, full affect   EKG:  EKG is not ordered today.    Recent Labs: 03/07/2014: Magnesium 2.0 03/27/2014: Hemoglobin 12.9*; Platelets 116.0* 06/05/2014: BUN 25*; Creatinine 1.1; Potassium 4.4; Sodium 139    Lipid Panel No results found for: CHOL, TRIG, HDL, CHOLHDL, VLDL, LDLCALC, LDLDIRECT    Wt Readings from Last 3 Encounters:  12/03/14 162 lb (73.483 kg)  08/28/14 160 lb (72.576 kg)  07/03/14 158 lb (71.668 kg)      Other studies Reviewed: Additional studies/ records that were reviewed  today include: None Review of the above records demonstrates:    ASSESSMENT AND PLAN:  S/P CABG (coronary artery bypass graft): Asymptomatic  Chronic systolic heart failure: Exclude the possibility that LV function has improved with rate control  Right Inguinal Hernia: Symptomatic   Long term current use of anticoagulant therapy: No complications  HYPERTENSION, essential.   Chronic atrial fibrillation: Excellent rate control     Current medicines are reviewed at length with the patient today.  The patient has concerns regarding management.  The following changes have been made:  Have asked the patient to avoid isometric activity and stick with isotonic/aerobic physical activities. After evaluating the right inguinal area I feel he needs a general surgery consultation since the  hernia is symptomatic. We will also reassess LV function now that we have good rate control for several months and he is on guideline mandated medical therapy to see if LV function has improved.  Labs/ tests ordered today include:   Orders Placed This Encounter  Procedures  . Ambulatory referral to General Surgery  . 2D Echocardiogram without contrast     Disposition:   FU with Adam [name] in 6 months   Signed, Adam Grooms, MD  12/03/2014 10:32 AM    [facility] [place], [place], [place]  [address] Phone: [phone]; Fax: [phone]

## 2014-12-03 NOTE — Patient Instructions (Addendum)
  Medication Instructions:  Your physician recommends that you continue on your current medications as directed. Please refer to the Current Medication list given to you today.   Labwork: None   Testing/Procedures: Your physician has requested that you have an echocardiogram. Echocardiography is a painless test that uses sound waves to create images of your heart. It provides your doctor with information about the size and shape of your heart and how well your heart's chambers and valves are working. This procedure takes approximately one hour. There are no restrictions for this procedure.    Follow-Up: Your physician wants you to follow-up in: 6-9 months You will receive a reminder letter in the mail two months in advance. If you don't receive a letter, please call our office to schedule the follow-up appointment.   Any Other Special Instructions Will Be Listed Below (If Applicable). You have been referred to a General Surgeon (for right inguinal hernia) Increase activity as tolerated

## 2014-12-10 ENCOUNTER — Ambulatory Visit (INDEPENDENT_AMBULATORY_CARE_PROVIDER_SITE_OTHER)
Admission: RE | Admit: 2014-12-10 | Discharge: 2014-12-10 | Disposition: A | Discharge status: Home / Self Care | Payer: Medicare Other | Source: Ambulatory Visit | Attending: Vascular Surgery | Admitting: Vascular Surgery

## 2014-12-10 ENCOUNTER — Ambulatory Visit (HOSPITAL_COMMUNITY)
Admission: RE | Admit: 2014-12-10 | Discharge: 2014-12-10 | Disposition: A | Discharge status: Home / Self Care | Payer: Medicare Other | Source: Ambulatory Visit | Attending: Vascular Surgery | Admitting: Vascular Surgery

## 2014-12-10 ENCOUNTER — Other Ambulatory Visit: Payer: Self-pay | Admitting: Vascular Surgery

## 2014-12-10 DIAGNOSIS — R1909 Other intra-abdominal and pelvic swelling, mass and lump: Secondary | ICD-10-CM

## 2014-12-10 DIAGNOSIS — I714 Abdominal aortic aneurysm, without rupture, unspecified: Secondary | ICD-10-CM

## 2014-12-10 DIAGNOSIS — R1030 Lower abdominal pain, unspecified: Secondary | ICD-10-CM

## 2014-12-10 DIAGNOSIS — Z95828 Presence of other vascular implants and grafts: Secondary | ICD-10-CM

## 2014-12-10 DIAGNOSIS — R1900 Intra-abdominal and pelvic swelling, mass and lump, unspecified site: Secondary | ICD-10-CM

## 2014-12-10 DIAGNOSIS — R1032 Left lower quadrant pain: Secondary | ICD-10-CM | POA: Diagnosis not present

## 2014-12-10 DIAGNOSIS — R1031 Right lower quadrant pain: Secondary | ICD-10-CM | POA: Insufficient documentation

## 2014-12-12 ENCOUNTER — Other Ambulatory Visit (HOSPITAL_COMMUNITY): Payer: PRIVATE HEALTH INSURANCE

## 2014-12-13 ENCOUNTER — Encounter: Payer: Self-pay | Admitting: Vascular Surgery

## 2014-12-14 ENCOUNTER — Ambulatory Visit (INDEPENDENT_AMBULATORY_CARE_PROVIDER_SITE_OTHER): Payer: Medicare Other | Admitting: Vascular Surgery

## 2014-12-14 ENCOUNTER — Encounter: Payer: Self-pay | Admitting: Vascular Surgery

## 2014-12-14 VITALS — BP 132/73 | HR 46 | Ht 68.0 in | Wt 162.0 lb

## 2014-12-14 DIAGNOSIS — I714 Abdominal aortic aneurysm, without rupture, unspecified: Secondary | ICD-10-CM

## 2014-12-14 DIAGNOSIS — K4090 Unilateral inguinal hernia, without obstruction or gangrene, not specified as recurrent: Secondary | ICD-10-CM | POA: Diagnosis not present

## 2014-12-14 DIAGNOSIS — Z48812 Encounter for surgical aftercare following surgery on the circulatory system: Secondary | ICD-10-CM

## 2014-12-14 DIAGNOSIS — I25810 Atherosclerosis of coronary artery bypass graft(s) without angina pectoris: Secondary | ICD-10-CM

## 2014-12-14 NOTE — Addendum Note (Signed)
Addended by: Mena Goes on: 12/14/2014 03:07 PM   Modules accepted: Orders

## 2014-12-14 NOTE — Progress Notes (Signed)
    Established EVAR  History of Present Illness  Adam [name] is a 81 y.o. ([birth year]) male who presents for routine follow up s/p EVAR (Date: 07/28/11).  Most recent EVAR duplex (Date: 12/10/14) demonstrates: no endoleak and stable sac size.  Most recent CTA (Date: 08/25/13) demonstrates: small Type 2 endoleak and shrinking sac size.  The patient has had back or abdominal pain.  He recent had a R groin strain and now has a R groin bulge intermittently.  He has pain from the R groin which improves with bowel movements  The patient's PMH, PSH, SH, FamHx, Med, and Allergies are unchanged from 08/25/13.  On ROS today: groin strain, RIH  Physical Examination  Filed Vitals:   12/14/14 0828  BP: 132/73  Pulse: 46  Height: 5\' 8"  (1.727 m)  Weight: 162 lb (73.483 kg)  SpO2: 98%   Body mass index is 24.64 kg/(m^2).  General: A&O x 3, WD, elderly and thin  Pulmonary: Sym exp, good air movt, CTAB, no rales, rhonchi, & wheezing  Cardiac: RRR, Nl S1, S2, no Murmurs, rubs or gallops  Vascular: Vessel Right Left  Radial Palpable Palpable  Brachial Palpable Palpable  Carotid Palpable, without bruit Palpable, without bruit  Aorta Not palpable N/A  Femoral Palpable Palpable  Popliteal Not palpable Not palpable  PT Palpable Palpable  DP Palpable Palpable   Gastrointestinal: soft, NTND, -G/R, - HSM, - masses, - CVAT B, impulse palpable over inguinal ring with valsalva manuever  Musculoskeletal: M/S 5/5 throughout , Extremities without ischemic changes   Neurologic: Pain and light touch intact in extremities , Motor exam as listed above  Non-Invasive Vascular Imaging  EVAR Duplex (Date: 12/10/14)  AAA sac size: 4.5 cm x 4.6 cm  Unable to evaluate for endoleak given bowel gas  Medical Decision Making  Adam [name] is a 81 y.o. male who presents s/p EVAR.  Pt is asymptomatic with decreasing sac size.   Regardless of the prior small type 2 endoleak, the aortic sac is shrinking so  no secondary intervention is needed.  I don't feel CTA is necessary to document possible resolution of this endoleak.  I discussed with the patient the importance of surveillance of the endograft.  The next endograft duplex will be scheduled for 12 months.  The next CTA will be scheduled 5 year mark to look for persistent type 2 endoleak  The patient will follow up with [place] in 12 months with these studies.  Thank you for allowing [place] to participate in this patient's care.  Adele Barthel, MD [facility] Office: [phone] Pager: [phone]  12/14/2014, 8:52 AM

## 2014-12-20 DIAGNOSIS — I2510 Atherosclerotic heart disease of native coronary artery without angina pectoris: Secondary | ICD-10-CM | POA: Diagnosis not present

## 2014-12-20 DIAGNOSIS — Z95828 Presence of other vascular implants and grafts: Secondary | ICD-10-CM | POA: Diagnosis not present

## 2014-12-20 DIAGNOSIS — I4891 Unspecified atrial fibrillation: Secondary | ICD-10-CM | POA: Diagnosis not present

## 2014-12-20 DIAGNOSIS — K4020 Bilateral inguinal hernia, without obstruction or gangrene, not specified as recurrent: Secondary | ICD-10-CM | POA: Diagnosis not present

## 2014-12-20 DIAGNOSIS — Z951 Presence of aortocoronary bypass graft: Secondary | ICD-10-CM | POA: Diagnosis not present

## 2014-12-20 DIAGNOSIS — I5022 Chronic systolic (congestive) heart failure: Secondary | ICD-10-CM | POA: Diagnosis not present

## 2014-12-20 DIAGNOSIS — Z5181 Encounter for therapeutic drug level monitoring: Secondary | ICD-10-CM | POA: Diagnosis not present

## 2014-12-20 DIAGNOSIS — Z950 Presence of cardiac pacemaker: Secondary | ICD-10-CM | POA: Diagnosis not present

## 2014-12-20 DIAGNOSIS — Z96653 Presence of artificial knee joint, bilateral: Secondary | ICD-10-CM | POA: Diagnosis not present

## 2014-12-20 DIAGNOSIS — Z7901 Long term (current) use of anticoagulants: Secondary | ICD-10-CM | POA: Diagnosis not present

## 2014-12-21 ENCOUNTER — Telehealth: Payer: Self-pay | Admitting: Interventional Cardiology

## 2014-12-21 ENCOUNTER — Other Ambulatory Visit: Payer: Self-pay | Admitting: General Surgery

## 2014-12-21 NOTE — Telephone Encounter (Signed)
Cardiac clearance rqst routed to Dr.Smith to advise.

## 2014-12-21 NOTE — Telephone Encounter (Signed)
Request for surgical clearance:  What type of surgery is being performed? Inguinal hernia repair    1. When is this surgery scheduled? Pending clearnace  2. Are there any medications that need to be held prior to surgery and how long?Coumadin 5 days pre op    3. Name of physician performing surgery? Dr. Dalbert Batman   4. What is your office phone and fax number? Fax [phone]  Comments:

## 2014-12-24 NOTE — Telephone Encounter (Signed)
The patient is cleared to undergo inguinal herniorrhaphy. It would be okay to stop the patient's Coumadin therapy 3-5 days prior to surgery and resume when safe.

## 2014-12-25 NOTE — Telephone Encounter (Signed)
Cardiac clearance placed in Nurse fax box in MR to fax to [place] office

## 2014-12-26 ENCOUNTER — Other Ambulatory Visit: Payer: Self-pay | Admitting: General Surgery

## 2015-01-02 ENCOUNTER — Ambulatory Visit (INDEPENDENT_AMBULATORY_CARE_PROVIDER_SITE_OTHER): Payer: Medicare Other | Admitting: *Deleted

## 2015-01-02 ENCOUNTER — Telehealth: Payer: Self-pay | Admitting: Cardiology

## 2015-01-02 DIAGNOSIS — I495 Sick sinus syndrome: Secondary | ICD-10-CM

## 2015-01-02 NOTE — Telephone Encounter (Signed)
LMOVM reminding pt to send remote transmission.   

## 2015-01-02 NOTE — Pre-Procedure Instructions (Signed)
Adam [name]  01/02/2015   Your procedure is scheduled on:  Friday, Jan 04, 2015  Report to [facility] Admitting at 5:30 AM.  Call this number if you have problems the morning of surgery: [phone]   Remember: Follow doctors instructions regarding warfarin (COUMADIN)   Do not eat food or drink liquids after midnight.   Take these medicines the morning of surgery with A SIP OF WATER: metoprolol tartrate (LOPRESSOR), if needed: tylenol for pain,  nitroGLYCERIN  for chest pain  Stop taking Aspirin, Coumadin, Vitamins and herbal medications such as Fish Oil. Do not take any NSAIDs ie: Ibuprofen, Advil, Naproxen or any medication containing Aspirin ; stop now.   Do not wear jewelry, make-up or nail polish.  Do not wear lotions, powders, or perfumes. You may not wear deodorant.  Do not shave 48 hours prior to surgery. Men may shave face and neck.  Do not bring valuables to the hospital.  [facility] is not responsible for any belongings or valuables.               Contacts, dentures or bridgework may not be worn into surgery.  Leave suitcase in the car. After surgery it may be brought to your room.  For patients admitted to the hospital, discharge time is determined by your treatment team.               Patients discharged the day of surgery will not be allowed to drive home.  Name and phone number of your driver:   Special Instructions:  Special Instructions:Special Instructions: [facility] - Preparing for Surgery  Before surgery, you can play an important role.  Because skin is not sterile, your skin needs to be as free of germs as possible.  You can reduce the number of germs on you skin by washing with CHG (chlorahexidine gluconate) soap before surgery.  CHG is an antiseptic cleaner which kills germs and bonds with the skin to continue killing germs even after washing.  Please DO NOT use if you have an allergy to CHG or antibacterial soaps.  If your skin becomes  reddened/irritated stop using the CHG and inform your nurse when you arrive at Short Stay.  Do not shave (including legs and underarms) for at least 48 hours prior to the first CHG shower.  You may shave your face.  Please follow these instructions carefully:   1.  Shower with CHG Soap the night before surgery and the morning of Surgery.  2.  If you choose to wash your hair, wash your hair first as usual with your normal shampoo.  3.  After you shampoo, rinse your hair and body thoroughly to remove the Shampoo.  4.  Use CHG as you would any other liquid soap.  You can apply chg directly  to the skin and wash gently with scrungie or a clean washcloth.  5.  Apply the CHG Soap to your body ONLY FROM THE NECK DOWN.  Do not use on open wounds or open sores.  Avoid contact with your eyes, ears, mouth and genitals (private parts).  Wash genitals (private parts) with your normal soap.  6.  Wash thoroughly, paying special attention to the area where your surgery will be performed.  7.  Thoroughly rinse your body with warm water from the neck down.  8.  DO NOT shower/wash with your normal soap after using and rinsing off the CHG Soap.  9.  Pat yourself dry with a  clean towel.            10.  Wear clean pajamas.            11.  Place clean sheets on your bed the night of your first shower and do not sleep with pets.  Day of Surgery  Do not apply any lotions/deodorants the morning of surgery.  Please wear clean clothes to the hospital/surgery center.   Please read over the following fact sheets that you were given: Pain Booklet, Coughing and Deep Breathing and Surgical Site Infection Prevention

## 2015-01-02 NOTE — H&P (Signed)
Adam [name]  Location: [facility] Patient #: [id number] DOB: 1924-08-29 Married / Language: English / Race: White Male        History of Present Illness  .  The patient is a 81 year old male who presents with an inguinal hernia. This is a very pleasant 81 year old gentleman who is here with his wife. He was referred by his cardiologist, Dr. Daneen Schick, because of a painful right inguinal hernia. Dr. [name] at [facility] is his PCP. He has no prior history of inguinal hernia. 3 months ago he felt that he pulled something and he has had right groin pain and a bulge ever since. It is reducible when supine. Bothers him more when he is sitting in a chair. He has smaller left inguinal hernia that he can feel but it does not cause any pain. He denies back pain or hip pain. Denies radiculopathy. Has chronic atrial fibrillation on Coumadin. Pacemaker. Endovascular aortic stent placement 3 or 4 years ago by Dr. Bridgett Larsson for abdominal aortic aneurysm. Seems to be doing well from that. Coronary artery disease, status post CABG. Really doing fairly well from a cardiovascular standpoint. History GERD. Hyperlipidemia. Congestive heart failure listed. Last colonoscopy 7-8 years ago. Bilateral total knee replacements. He has had lots of questions and is interested in hernia repair at some point. I have told him that we could repair the right inguinal hernia alone or go ahead with bilateral inguinal hernias at the same time. I told him we would avoid laparoscopic approach because of the risk of bleeding on Coumadin. I discussed the indications, details, techniques, and numerous risk of the surgery with him and his wife. They're aware of the risk of bleeding, infection, recurrence of the hernia, nerve damage with chronic pain, injury to the bladder or testicle with major reconstructive surgery, rare. Cardiac pulmonary and thromboembolic problems. I told him that  we would need cardiac risk assessment by Dr. [name], but that he was just recently evaluation so this should not be a problem. I told him he would need to be off Coumadin for 5 days. At the end of a very lengthy discussion he is going to go home and think about this for a day or 2 and let me know. Addendum Note(Adam Maker M. Dalbert Batman MD) patient called and wanted the right inguinal hernia repair scheduled, but only wanted the right inguinal hernia repair. He be instructed to hold his Coumadin 5 days preop. await cardiac risk assessment with Dr. [name].    Other Problems Arthritis Atrial Fibrillation Back Pain Bladder Problems Congestive Heart Failure Enlarged Prostate High blood pressure Hypercholesterolemia Inguinal Hernia Sleep Apnea  Past Surgical History Aneurysm Repair Bypass Surgery for Poor Blood Flow to Legs Cataract Surgery Bilateral. Colon Polyp Removal - Colonoscopy Coronary Artery Bypass Graft Knee Surgery Bilateral. Oral Surgery Tonsillectomy Vasectomy  Diagnostic Studies History  Colonoscopy >10 years ago  Allergies  Morphine Sulfate *ANALGESICS - OPIOID* Amiodarone HCl *ANTIARRHYTHMICS*  Medication History  Atorvastatin Calcium (10MG  Tablet, Oral daily) Active. Boostrix (5-2.5-18.5 Suspension, Intramuscular) Active. Furosemide (40MG  Tablet, Oral daily) Active. Warfarin Sodium (5MG  Tablet, Oral) Active. Rapaflo (8MG  Capsule, Oral daily) Active. Lisinopril (2.5MG  Tablet, Oral daily) Active. Latanoprost (0.005% Solution, Ophthalmic) Active. Vitamin D (2000UNIT Capsule, Oral daily) Active. PreserVision AREDS (Oral daily) Active. Metoprolol Tartrate (25MG  Tablet, Oral daily) Active. Fish Oil (1000MG  Capsule, Oral) Active.  Social History Alcohol use Moderate alcohol use. Caffeine use Coffee. No drug use Tobacco use Former smoker.  Family History Alcohol  Abuse Father. Arthritis Mother, Sister, Pandora Leiter. Breast  Cancer Sister. Heart Disease Father. Heart disease in male family member before age 81 Hypertension Father, Mother.  Review of Systems  General Present- Fatigue. Not Present- Appetite Loss, Chills, Fever, Night Sweats, Weight Gain and Weight Loss. Skin Present- Dryness and Non-Healing Wounds. Not Present- Change in Wart/Mole, Hives, Jaundice, New Lesions, Rash and Ulcer. HEENT Present- Hearing Loss, Seasonal Allergies, Visual Disturbances and Wears glasses/contact lenses. Not Present- Earache, Hoarseness, Nose Bleed, Oral Ulcers, Ringing in the Ears, Sinus Pain, Sore Throat and Yellow Eyes. Breast Not Present- Breast Mass, Breast Pain, Nipple Discharge and Skin Changes. Cardiovascular Present- Shortness of Breath and Swelling of Extremities. Not Present- Chest Pain, Difficulty Breathing Lying Down, Leg Cramps, Palpitations and Rapid Heart Rate. Gastrointestinal Present- Abdominal Pain, Bloating, Constipation, Excessive gas, Gets full quickly at meals, Indigestion and Nausea. Not Present- Bloody Stool, Change in Bowel Habits, Chronic diarrhea, Difficulty Swallowing, Hemorrhoids, Rectal Pain and Vomiting. Male Genitourinary Present- Frequency and Urgency. Not Present- Blood in Urine, Change in Urinary Stream, Impotence, Nocturia, Painful Urination and Urine Leakage. Musculoskeletal Present- Back Pain, Joint Pain, Muscle Pain and Muscle Weakness. Not Present- Joint Stiffness and Swelling of Extremities. Neurological Present- Trouble walking and Weakness. Not Present- Decreased Memory, Fainting, Headaches, Numbness, Seizures, Tingling and Tremor. Hematology Present- Easy Bruising and Excessive bleeding. Not Present- Gland problems, HIV and Persistent Infections.   Vitals   Weight: 160 lb Height: 68in Body Surface Area: 1.87 m Body Mass Index: 24.33 kg/m Temp.: 97.40F(Oral)  Pulse: 62 (Regular)  BP: 118/60 (Sitting, Right Arm, Standard)    Physical Exam  General Mental  Status-Alert. General Appearance-Consistent with stated age. Hydration-Well hydrated. Voice-Normal. Note: Thin. Very fit for his age, despite cardiac history. Alert and oriented. Very cooperative.   Head and Neck Head-normocephalic, atraumatic with no lesions or palpable masses. Trachea-midline. Thyroid Gland Characteristics - normal size and consistency.  Eye Eyeball - Bilateral-Extraocular movements intact. Sclera/Conjunctiva - Bilateral-No scleral icterus.  Chest and Lung Exam Chest and lung exam reveals -quiet, even and easy respiratory effort with no use of accessory muscles and on auscultation, normal breath sounds, no adventitious sounds and normal vocal resonance. Inspection Chest Wall - Normal. Back - normal. Note: Well-healed sternotomy scar. Pacemaker right infraclavicular area. Lungs clear.   Cardiovascular Cardiovascular examination reveals -normal pedal pulses bilaterally. Note: Heart is somewhat irregular, controlled rate, faint systolic murmur. Radial and femoral pulses easily palpable.  Abdomen Inspection Inspection of the abdomen reveals - No Hernias. Skin - Scar - no surgical scars. Palpation/Percussion Palpation and Percussion of the abdomen reveal - Soft, Non Tender, No Rebound tenderness, No Rigidity (guarding) and No hepatosplenomegaly. Auscultation Auscultation of the abdomen reveals - Bowel sounds normal.  Male Genitourinary Note: Reducible medium-sized right inguinal hernia. Smaller left inguinal hernia. Both obvious. No scrotal or testicular mass. I don't really see much of a scar in the right groin from his catheterization.   Neurologic Neurologic evaluation reveals -alert and oriented x 3 with no impairment of recent or remote memory. Mental Status-Normal.  Musculoskeletal Normal Exam - Left-Upper Extremity Strength Normal and Lower Extremity Strength Normal. Normal Exam - Right-Upper Extremity Strength Normal  and Lower Extremity Strength Normal. Note: Long scar right leg, saphenous vein harvesting, healed.   Lymphatic Head & Neck  General Head & Neck Lymphatics: Bilateral - Description - Normal. Axillary  General Axillary Region: Bilateral - Description - Normal. Tenderness - Non Tender. Femoral & Inguinal  Generalized Femoral & Inguinal Lymphatics: Bilateral - Description -  Normal. Tenderness - Non Tender.    Assessment & Plan BILATERAL INGUINAL HERNIA WITHOUT OBSTRUCTION OR GANGRENE, RECURRENCE NOT SPECIFIED (550.92  K40.20)  You have a moderate sized, painful, right inguinal hernia. You have a smaller left inguinal hernia that is not causing any pain. These will likely progress over time You have been offered elective repair of your bilateral inguinal hernias with mesh. I do not think that laparoscopic repair is a good idea in a patient on Coumadin due to the risk of bleeding We have discussed the indications, techniques, and risks of this surgery in detail You have stated that you're going to go home and think about the surgery, and call me back when you are ready to schedule If you decide to have the surgery, you will need to be off of your Coumadin for 5 days, and we will need to ask Dr. [name] for a cardiac risk assessment. If you decide to do the surgery, we will perform the surgery at [facility] hospital and observe you in the hospital overnight. ADDENDUM: Patient requests open repair RIH with mesh.  Cardiac clearance completed.   CAD IN NATIVE ARTERY (414.01  I25.10)  HISTORY OF CORONARY ARTERY BYPASS GRAFT (V45.81  Z95.1)  HISTORY OF ENDOVASCULAR STENT GRAFT FOR ABDOMINAL AORTIC ANEURYSM (V43.4  E45.409) Impression: Dr. Bridgett Larsson. Approximately 2012. Seems to be stable from recent ultrasound.  PACEMAKER (V45.01  Z95.0)  ANTICOAGULATED ON COUMADIN (V58.83  Z51.81)  ATRIAL FIBRILLATION, CONTROLLED (427.31  W11.91)  CHRONIC SYSTOLIC CONGESTIVE HEART FAILURE (428.22   I50.22)  HISTORY OF TOTAL KNEE REPLACEMENT, BILATERAL (V43.65  Z96.653)     Edsel Petrin. Dalbert [name], M.D., [facility] General and Minimally invasive Surgery Breast and Colorectal Surgery Office:   [phone] Pager:   [phone]

## 2015-01-03 ENCOUNTER — Encounter (HOSPITAL_COMMUNITY): Payer: Self-pay

## 2015-01-03 ENCOUNTER — Encounter (HOSPITAL_COMMUNITY)
Admission: RE | Admit: 2015-01-03 | Discharge: 2015-01-03 | Disposition: A | Discharge status: Home / Self Care | Payer: Medicare Other | Source: Ambulatory Visit | Attending: General Surgery | Admitting: General Surgery

## 2015-01-03 ENCOUNTER — Other Ambulatory Visit: Payer: Self-pay

## 2015-01-03 DIAGNOSIS — K4090 Unilateral inguinal hernia, without obstruction or gangrene, not specified as recurrent: Secondary | ICD-10-CM | POA: Diagnosis not present

## 2015-01-03 DIAGNOSIS — I4891 Unspecified atrial fibrillation: Secondary | ICD-10-CM | POA: Diagnosis not present

## 2015-01-03 DIAGNOSIS — G4730 Sleep apnea, unspecified: Secondary | ICD-10-CM | POA: Diagnosis not present

## 2015-01-03 DIAGNOSIS — Z87891 Personal history of nicotine dependence: Secondary | ICD-10-CM | POA: Diagnosis not present

## 2015-01-03 DIAGNOSIS — Z950 Presence of cardiac pacemaker: Secondary | ICD-10-CM | POA: Diagnosis not present

## 2015-01-03 DIAGNOSIS — I10 Essential (primary) hypertension: Secondary | ICD-10-CM | POA: Diagnosis not present

## 2015-01-03 DIAGNOSIS — I5022 Chronic systolic (congestive) heart failure: Secondary | ICD-10-CM | POA: Diagnosis not present

## 2015-01-03 DIAGNOSIS — Z79899 Other long term (current) drug therapy: Secondary | ICD-10-CM | POA: Diagnosis not present

## 2015-01-03 DIAGNOSIS — Z96652 Presence of left artificial knee joint: Secondary | ICD-10-CM | POA: Diagnosis not present

## 2015-01-03 DIAGNOSIS — Z7901 Long term (current) use of anticoagulants: Secondary | ICD-10-CM | POA: Diagnosis not present

## 2015-01-03 DIAGNOSIS — I509 Heart failure, unspecified: Secondary | ICD-10-CM | POA: Diagnosis not present

## 2015-01-03 DIAGNOSIS — E780 Pure hypercholesterolemia: Secondary | ICD-10-CM | POA: Diagnosis not present

## 2015-01-03 DIAGNOSIS — M1990 Unspecified osteoarthritis, unspecified site: Secondary | ICD-10-CM | POA: Diagnosis not present

## 2015-01-03 DIAGNOSIS — E785 Hyperlipidemia, unspecified: Secondary | ICD-10-CM | POA: Diagnosis not present

## 2015-01-03 DIAGNOSIS — Z9852 Vasectomy status: Secondary | ICD-10-CM | POA: Diagnosis not present

## 2015-01-03 DIAGNOSIS — I495 Sick sinus syndrome: Secondary | ICD-10-CM | POA: Diagnosis not present

## 2015-01-03 DIAGNOSIS — Z951 Presence of aortocoronary bypass graft: Secondary | ICD-10-CM | POA: Diagnosis not present

## 2015-01-03 DIAGNOSIS — I2510 Atherosclerotic heart disease of native coronary artery without angina pectoris: Secondary | ICD-10-CM | POA: Diagnosis not present

## 2015-01-03 DIAGNOSIS — Z886 Allergy status to analgesic agent status: Secondary | ICD-10-CM | POA: Diagnosis not present

## 2015-01-03 DIAGNOSIS — Z86010 Personal history of colonic polyps: Secondary | ICD-10-CM | POA: Diagnosis not present

## 2015-01-03 DIAGNOSIS — K219 Gastro-esophageal reflux disease without esophagitis: Secondary | ICD-10-CM | POA: Diagnosis not present

## 2015-01-03 DIAGNOSIS — I714 Abdominal aortic aneurysm, without rupture: Secondary | ICD-10-CM | POA: Diagnosis not present

## 2015-01-03 HISTORY — DX: Sleep apnea, unspecified: G47.30

## 2015-01-03 HISTORY — DX: Reserved for inherently not codable concepts without codable children: IMO0001

## 2015-01-03 HISTORY — DX: Essential (primary) hypertension: I10

## 2015-01-03 HISTORY — DX: Angina pectoris, unspecified: I20.9

## 2015-01-03 LAB — COMPREHENSIVE METABOLIC PANEL
ALT: 22 U/L (ref 17–63)
AST: 35 U/L (ref 15–41)
Albumin: 4.1 g/dL (ref 3.5–5.0)
Alkaline Phosphatase: 66 U/L (ref 38–126)
Anion gap: 9 (ref 5–15)
BUN: 22 mg/dL — AB (ref 6–20)
CHLORIDE: 102 mmol/L (ref 101–111)
CO2: 28 mmol/L (ref 22–32)
Calcium: 9.4 mg/dL (ref 8.9–10.3)
Creatinine, Ser: 0.94 mg/dL (ref 0.61–1.24)
GFR calc non Af Amer: >60 mL/min (ref >60)
Glucose, Bld: 139 mg/dL — ABNORMAL HIGH (ref 65–99)
POTASSIUM: 4.9 mmol/L (ref 3.5–5.1)
Sodium: 139 mmol/L (ref 135–145)
Total Bilirubin: 1.3 mg/dL — ABNORMAL HIGH (ref 0.3–1.2)
Total Protein: 7 g/dL (ref 6.5–8.1)

## 2015-01-03 LAB — CBC WITH DIFFERENTIAL/PLATELET
Basophils Absolute: 0 10*3/uL (ref 0.0–0.1)
Basophils Relative: 0 % (ref 0–1)
EOS ABS: 0.1 10*3/uL (ref 0.0–0.7)
Eosinophils Relative: 1 % (ref 0–5)
HCT: 36.1 % — ABNORMAL LOW (ref 39.0–52.0)
Hemoglobin: 12.1 g/dL — ABNORMAL LOW (ref 13.0–17.0)
Lymphocytes Relative: 17 % (ref 12–46)
Lymphs Abs: 1.7 10*3/uL (ref 0.7–4.0)
MCH: 33.2 pg (ref 26.0–34.0)
MCHC: 33.5 g/dL (ref 30.0–36.0)
MCV: 99.2 fL (ref 78.0–100.0)
Monocytes Absolute: 0.8 10*3/uL (ref 0.1–1.0)
Monocytes Relative: 8 % (ref 3–12)
Neutro Abs: 7.3 10*3/uL (ref 1.7–7.7)
Neutrophils Relative %: 74 % (ref 43–77)
Platelets: 122 10*3/uL — ABNORMAL LOW (ref 150–400)
RBC: 3.64 MIL/uL — AB (ref 4.22–5.81)
RDW: 13.8 % (ref 11.5–15.5)
WBC: 10 10*3/uL (ref 4.0–10.5)

## 2015-01-03 LAB — PROTIME-INR
INR: 1.31 (ref 0.00–1.49)
Prothrombin Time: 16.4 seconds — ABNORMAL HIGH (ref 11.6–15.2)

## 2015-01-03 LAB — APTT: aPTT: 32 seconds (ref 24–37)

## 2015-01-03 NOTE — Progress Notes (Signed)
Remote pacemaker transmission.   

## 2015-01-03 NOTE — Progress Notes (Addendum)
Anesthesia consult:  Pt is 81 year old Adam [name] scheduled for open R inguinal hernia repair on 01/04/2015 with Dr. Dalbert Batman.   Called to see pt to evaluate hematoma on R lower leg.  Pt tripped on 12/29/2014, bumping R shin. Was still taking coumadin at the time. Has quarter-sized collection of old blood under skin, some bloody oozing from most proximal aspect. Pt denies fever, denies purulent drainage or any drainage other than blood. Moderately tender to palp. Some mild erythema immediately surrounding hematoma. Mild pitting edema of lower legs is equal bilaterally and not new per pt and wife. No warmth. No evidence of infection.   Cardiologist is Dr. Daneen Schick. EP cardiologist is Dr. Caryl Comes. PCP is Dr. Nehemiah Settle. Also sees cardiology at [facility].   PMH includes: CAD (s/p CABG), permanent atrial fibrillation, tachy brady syndrome, pacemaker, CHF, HTN, AAA (s/p endovascular aortic stent insertion 07/28/11), hyperlipidemia, OSA, GERD. Former smoker.    Medications include: lipitor, lasix, lisinopril, metoprolol, coumadin. Pt stopped taking coumadin 12/29/14.   Preoperative labs reviewed.  Platelets 122. PT 16.4.   EKG 07/03/2014: Atrial fibrillation, V pacing.   Abdominal aorta duplex 12/10/2014: -patent aortic stent graft without evidence of stenosis or hyperplasia -abdominal aortic sac present measuring 4.65cm AP x 4.58cm TRV, unable to rule out endoleak due to bowel gas present -compared to previous exam 08/28/14, essentially stable abdominal aortic sac size.   Cardiac cath 03/28/2014: 1. Severe native vessel coronary disease with total occlusion of the LAD, total occlusion of the distal RCA, and high-grade proximal circumflex (very small territory is supplied). The vessels are heavily calcified. 2. Patent LIMA to LAD, widely patent saphenous vein graft to the first diagonal, and widely patent saphenous vein graft to the RCA. The saphenous vein graft to the small circumflex is totally occluded  (chronic). 3. Left ventricular dilatation and hypocontractility with EF 25%. Coronary obstruction would not account for the patient's global reduction in LV function. The left ventricular dysfunction seems unrelated to ischemia/infarction raising the question of tachycardia mediated mechanism versus idiopathic etiology. RECOMMENDATION: Continue management of arrhythmia per Dr. [name] and Dr. Caryl Comes.   Echo 06/04/2014 see care everywhere: -Severe LV dysfunction, EF 25%.  -normal RV systolic function -Valvular regurgitation: TRIVIAL AR, TRIVIAL MR, TRIVIAL PR, MODERATE TR -no valvular stenosis -Compared with prior Echo study on 03/30/2012: LA size and  MR has decreased  Pt offered a defibrillator per notes in Care Everywhere dated 08/24/2014 given his decreased EF but pt declined.   Pt has cardiac clearance for procedure from Dr. [name] in telephone encounter dated 12/24/2014.  If no changes, I anticipate pt can proceed with surgery as scheduled.   Willeen Cass, FNP-BC [facility]/Anesthesiology Phone: [phone] 01/03/2015 3:06 PM

## 2015-01-03 NOTE — Progress Notes (Signed)
PCP is Dr Harrington Challenger Cardiologist is Dr. [name] Dr. Caryl Comes takes care of his pace maker- devise orders faxed to Dr Olin Pia office. Pt states he last took coumadin Sat.12-29-14 He also states he tripped over a plant and fell Sat.he shows me an open area to his right lower leg with a dressing in place, small amount of bloody drainage noted to dressing. Levada Dy, Np came over to assess the area.

## 2015-01-04 ENCOUNTER — Ambulatory Visit (HOSPITAL_COMMUNITY): Payer: Medicare Other | Admitting: Certified Registered Nurse Anesthetist

## 2015-01-04 ENCOUNTER — Ambulatory Visit (HOSPITAL_COMMUNITY)
Admission: RE | Admit: 2015-01-04 | Discharge: 2015-01-05 | Disposition: A | Discharge status: Home / Self Care | Payer: Medicare Other | Source: Ambulatory Visit | Attending: General Surgery | Admitting: General Surgery

## 2015-01-04 ENCOUNTER — Ambulatory Visit (HOSPITAL_COMMUNITY): Payer: Medicare Other | Admitting: Emergency Medicine

## 2015-01-04 ENCOUNTER — Encounter (HOSPITAL_COMMUNITY): Payer: Self-pay | Admitting: *Deleted

## 2015-01-04 ENCOUNTER — Encounter (HOSPITAL_COMMUNITY)
Admission: RE | Disposition: A | Discharge status: Home / Self Care | Payer: Self-pay | Source: Ambulatory Visit | Attending: General Surgery

## 2015-01-04 DIAGNOSIS — Z9889 Other specified postprocedural states: Secondary | ICD-10-CM

## 2015-01-04 DIAGNOSIS — Z8679 Personal history of other diseases of the circulatory system: Secondary | ICD-10-CM

## 2015-01-04 DIAGNOSIS — E780 Pure hypercholesterolemia: Secondary | ICD-10-CM | POA: Insufficient documentation

## 2015-01-04 DIAGNOSIS — I2510 Atherosclerotic heart disease of native coronary artery without angina pectoris: Secondary | ICD-10-CM | POA: Insufficient documentation

## 2015-01-04 DIAGNOSIS — I509 Heart failure, unspecified: Secondary | ICD-10-CM | POA: Insufficient documentation

## 2015-01-04 DIAGNOSIS — G8918 Other acute postprocedural pain: Secondary | ICD-10-CM | POA: Diagnosis not present

## 2015-01-04 DIAGNOSIS — K219 Gastro-esophageal reflux disease without esophagitis: Secondary | ICD-10-CM | POA: Insufficient documentation

## 2015-01-04 DIAGNOSIS — I4891 Unspecified atrial fibrillation: Secondary | ICD-10-CM | POA: Diagnosis not present

## 2015-01-04 DIAGNOSIS — Z96652 Presence of left artificial knee joint: Secondary | ICD-10-CM | POA: Insufficient documentation

## 2015-01-04 DIAGNOSIS — Z9852 Vasectomy status: Secondary | ICD-10-CM | POA: Insufficient documentation

## 2015-01-04 DIAGNOSIS — Z86010 Personal history of colonic polyps: Secondary | ICD-10-CM | POA: Insufficient documentation

## 2015-01-04 DIAGNOSIS — K4090 Unilateral inguinal hernia, without obstruction or gangrene, not specified as recurrent: Secondary | ICD-10-CM | POA: Insufficient documentation

## 2015-01-04 DIAGNOSIS — Z79899 Other long term (current) drug therapy: Secondary | ICD-10-CM | POA: Insufficient documentation

## 2015-01-04 DIAGNOSIS — E785 Hyperlipidemia, unspecified: Secondary | ICD-10-CM | POA: Insufficient documentation

## 2015-01-04 DIAGNOSIS — Z951 Presence of aortocoronary bypass graft: Secondary | ICD-10-CM | POA: Insufficient documentation

## 2015-01-04 DIAGNOSIS — I5022 Chronic systolic (congestive) heart failure: Secondary | ICD-10-CM | POA: Insufficient documentation

## 2015-01-04 DIAGNOSIS — Z7901 Long term (current) use of anticoagulants: Secondary | ICD-10-CM | POA: Insufficient documentation

## 2015-01-04 DIAGNOSIS — M1990 Unspecified osteoarthritis, unspecified site: Secondary | ICD-10-CM | POA: Insufficient documentation

## 2015-01-04 DIAGNOSIS — I10 Essential (primary) hypertension: Secondary | ICD-10-CM | POA: Insufficient documentation

## 2015-01-04 DIAGNOSIS — G4730 Sleep apnea, unspecified: Secondary | ICD-10-CM | POA: Insufficient documentation

## 2015-01-04 DIAGNOSIS — I714 Abdominal aortic aneurysm, without rupture: Secondary | ICD-10-CM | POA: Insufficient documentation

## 2015-01-04 DIAGNOSIS — Z886 Allergy status to analgesic agent status: Secondary | ICD-10-CM | POA: Insufficient documentation

## 2015-01-04 DIAGNOSIS — I482 Chronic atrial fibrillation, unspecified: Secondary | ICD-10-CM | POA: Diagnosis present

## 2015-01-04 DIAGNOSIS — Z87891 Personal history of nicotine dependence: Secondary | ICD-10-CM | POA: Insufficient documentation

## 2015-01-04 DIAGNOSIS — Z950 Presence of cardiac pacemaker: Secondary | ICD-10-CM | POA: Insufficient documentation

## 2015-01-04 HISTORY — PX: INGUINAL HERNIA REPAIR: SHX194

## 2015-01-04 HISTORY — PX: INGUINAL HERNIA REPAIR: SUR1180

## 2015-01-04 HISTORY — PX: INSERTION OF MESH: SHX5868

## 2015-01-04 LAB — CBC
HCT: 34 % — ABNORMAL LOW (ref 39.0–52.0)
HEMOGLOBIN: 11.6 g/dL — AB (ref 13.0–17.0)
MCH: 33.8 pg (ref 26.0–34.0)
MCHC: 34.1 g/dL (ref 30.0–36.0)
MCV: 99.1 fL (ref 78.0–100.0)
Platelets: 105 10*3/uL — ABNORMAL LOW (ref 150–400)
RBC: 3.43 MIL/uL — AB (ref 4.22–5.81)
RDW: 13.9 % (ref 11.5–15.5)
WBC: 11.4 10*3/uL — AB (ref 4.0–10.5)

## 2015-01-04 LAB — SURGICAL PCR SCREEN
MRSA, PCR: NEGATIVE
Staphylococcus aureus: NEGATIVE

## 2015-01-04 LAB — CREATININE, SERUM
Creatinine, Ser: 0.98 mg/dL (ref 0.61–1.24)
GFR calc Af Amer: >60 mL/min (ref >60)
GFR calc non Af Amer: >60 mL/min (ref >60)

## 2015-01-04 SURGERY — REPAIR, HERNIA, INGUINAL, ADULT
Anesthesia: General | Site: Groin | Laterality: Right

## 2015-01-04 MED ORDER — FENTANYL CITRATE (PF) 100 MCG/2ML IJ SOLN
INTRAMUSCULAR | Status: DC | PRN
  Administered 2015-01-04 (×2): 50 ug via INTRAVENOUS

## 2015-01-04 MED ORDER — CHLORHEXIDINE GLUCONATE 4 % EX LIQD
1.0000 | Freq: Once | CUTANEOUS | Status: DC
Start: 2015-01-05 — End: 2015-01-04
  Filled 2015-01-04: qty 15

## 2015-01-04 MED ORDER — LISINOPRIL 2.5 MG PO TABS
2.5000 mg | ORAL_TABLET | Freq: Every day | ORAL | Status: DC
  Filled 2015-01-04 (×2): qty 1

## 2015-01-04 MED ORDER — PROPOFOL 10 MG/ML IV BOLUS
INTRAVENOUS | Status: DC | PRN
  Administered 2015-01-04: 110 mg via INTRAVENOUS

## 2015-01-04 MED ORDER — ONDANSETRON HCL 4 MG PO TABS: 4.0000 mg | ORAL_TABLET | Freq: Four times a day (QID) | ORAL | Status: DC | PRN

## 2015-01-04 MED ORDER — CHLORHEXIDINE GLUCONATE 4 % EX LIQD: 1.0000 "application " | Freq: Once | CUTANEOUS | Status: DC

## 2015-01-04 MED ORDER — HYDROCODONE-ACETAMINOPHEN 5-325 MG PO TABS
1.0000 | ORAL_TABLET | ORAL | Status: DC | PRN
  Administered 2015-01-04: 1 via ORAL
  Filled 2015-01-04: qty 1

## 2015-01-04 MED ORDER — SUCCINYLCHOLINE CHLORIDE 20 MG/ML IJ SOLN
INTRAMUSCULAR | Status: AC
  Filled 2015-01-04: qty 1

## 2015-01-04 MED ORDER — LACTATED RINGERS IV SOLN
INTRAVENOUS | Status: DC | PRN
  Administered 2015-01-04 (×2): via INTRAVENOUS

## 2015-01-04 MED ORDER — ENOXAPARIN SODIUM 30 MG/0.3ML ~~LOC~~ SOLN: 30.0000 mg | SUBCUTANEOUS | Status: DC

## 2015-01-04 MED ORDER — SODIUM CHLORIDE 0.9 % IV SOLN: INTRAVENOUS | Status: DC

## 2015-01-04 MED ORDER — BUPIVACAINE-EPINEPHRINE (PF) 0.5% -1:200000 IJ SOLN
INTRAMUSCULAR | Status: AC
  Filled 2015-01-04: qty 30

## 2015-01-04 MED ORDER — FENTANYL CITRATE (PF) 250 MCG/5ML IJ SOLN
INTRAMUSCULAR | Status: AC
  Filled 2015-01-04: qty 5

## 2015-01-04 MED ORDER — HYDROMORPHONE HCL 1 MG/ML IJ SOLN: 0.5000 mg | INTRAMUSCULAR | Status: DC | PRN

## 2015-01-04 MED ORDER — DEXAMETHASONE SODIUM PHOSPHATE 4 MG/ML IJ SOLN
INTRAMUSCULAR | Status: DC | PRN
  Administered 2015-01-04: 4 mg via INTRAVENOUS

## 2015-01-04 MED ORDER — ROCURONIUM BROMIDE 100 MG/10ML IV SOLN
INTRAVENOUS | Status: DC | PRN
  Administered 2015-01-04: 40 mg via INTRAVENOUS

## 2015-01-04 MED ORDER — PROPOFOL 10 MG/ML IV BOLUS
INTRAVENOUS | Status: AC
  Filled 2015-01-04: qty 20

## 2015-01-04 MED ORDER — LIDOCAINE HCL (CARDIAC) 20 MG/ML IV SOLN
INTRAVENOUS | Status: DC | PRN
  Administered 2015-01-04: 30 mg via INTRAVENOUS

## 2015-01-04 MED ORDER — VITAMIN D3 25 MCG (1000 UNIT) PO TABS
2000.0000 [IU] | ORAL_TABLET | Freq: Every day | ORAL | Status: DC
  Filled 2015-01-04 (×2): qty 2

## 2015-01-04 MED ORDER — METOPROLOL TARTRATE 25 MG PO TABS: 25.0000 mg | ORAL_TABLET | Freq: Two times a day (BID) | ORAL | Status: DC

## 2015-01-04 MED ORDER — MUPIROCIN 2 % EX OINT
1.0000 "application " | TOPICAL_OINTMENT | Freq: Once | CUTANEOUS | Status: AC
  Administered 2015-01-04: 1 via TOPICAL

## 2015-01-04 MED ORDER — BUPIVACAINE-EPINEPHRINE 0.5% -1:200000 IJ SOLN
INTRAMUSCULAR | Status: DC | PRN
  Administered 2015-01-04: 6 mL

## 2015-01-04 MED ORDER — CEFAZOLIN SODIUM-DEXTROSE 2-3 GM-% IV SOLR: 2.0000 g | INTRAVENOUS | Status: DC

## 2015-01-04 MED ORDER — EPHEDRINE SULFATE 50 MG/ML IJ SOLN
INTRAMUSCULAR | Status: AC
  Filled 2015-01-04: qty 1

## 2015-01-04 MED ORDER — 0.9 % SODIUM CHLORIDE (POUR BTL) OPTIME
TOPICAL | Status: DC | PRN
  Administered 2015-01-04: 1000 mL

## 2015-01-04 MED ORDER — ARTIFICIAL TEARS OP OINT
TOPICAL_OINTMENT | OPHTHALMIC | Status: AC
  Filled 2015-01-04: qty 3.5

## 2015-01-04 MED ORDER — DEXAMETHASONE SODIUM PHOSPHATE 4 MG/ML IJ SOLN
INTRAMUSCULAR | Status: AC
  Filled 2015-01-04: qty 1

## 2015-01-04 MED ORDER — LATANOPROST 0.005 % OP SOLN
1.0000 [drp] | Freq: Every day | OPHTHALMIC | Status: DC
  Administered 2015-01-04: 1 [drp] via OPHTHALMIC
  Filled 2015-01-04 (×2): qty 2.5

## 2015-01-04 MED ORDER — SUGAMMADEX SODIUM 200 MG/2ML IV SOLN
INTRAVENOUS | Status: DC | PRN
  Administered 2015-01-04: 140 mg via INTRAVENOUS

## 2015-01-04 MED ORDER — ROCURONIUM BROMIDE 50 MG/5ML IV SOLN
INTRAVENOUS | Status: AC
  Filled 2015-01-04: qty 1

## 2015-01-04 MED ORDER — MUPIROCIN 2 % EX OINT
TOPICAL_OINTMENT | CUTANEOUS | Status: AC
  Filled 2015-01-04: qty 22

## 2015-01-04 MED ORDER — VITAMIN D 50 MCG (2000 UT) PO CAPS: 2000.0000 [IU] | ORAL_CAPSULE | Freq: Every day | ORAL | Status: DC

## 2015-01-04 MED ORDER — STERILE WATER FOR INJECTION IJ SOLN
INTRAMUSCULAR | Status: AC
  Filled 2015-01-04: qty 10

## 2015-01-04 MED ORDER — ONDANSETRON HCL 4 MG/2ML IJ SOLN: 4.0000 mg | Freq: Four times a day (QID) | INTRAMUSCULAR | Status: DC | PRN

## 2015-01-04 MED ORDER — FENTANYL CITRATE (PF) 100 MCG/2ML IJ SOLN: 25.0000 ug | INTRAMUSCULAR | Status: DC | PRN

## 2015-01-04 MED ORDER — NITROGLYCERIN 0.4 MG SL SUBL: 0.4000 mg | SUBLINGUAL_TABLET | SUBLINGUAL | Status: DC | PRN

## 2015-01-04 MED ORDER — TAMSULOSIN HCL 0.4 MG PO CAPS
0.4000 mg | ORAL_CAPSULE | Freq: Every day | ORAL | Status: DC
  Administered 2015-01-04: 0.4 mg via ORAL
  Filled 2015-01-04: qty 1

## 2015-01-04 MED ORDER — ONDANSETRON HCL 4 MG/2ML IJ SOLN
INTRAMUSCULAR | Status: AC
  Filled 2015-01-04: qty 2

## 2015-01-04 MED ORDER — ONDANSETRON HCL 4 MG/2ML IJ SOLN: 4.0000 mg | Freq: Once | INTRAMUSCULAR | Status: DC | PRN

## 2015-01-04 MED ORDER — ATORVASTATIN CALCIUM 10 MG PO TABS
10.0000 mg | ORAL_TABLET | Freq: Every day | ORAL | Status: DC
  Administered 2015-01-04: 10 mg via ORAL
  Filled 2015-01-04: qty 1

## 2015-01-04 MED ORDER — ONDANSETRON HCL 4 MG/2ML IJ SOLN
INTRAMUSCULAR | Status: DC | PRN
  Administered 2015-01-04: 4 mg via INTRAVENOUS

## 2015-01-04 MED ORDER — EPHEDRINE SULFATE 50 MG/ML IJ SOLN
INTRAMUSCULAR | Status: DC | PRN
  Administered 2015-01-04 (×2): 5 mg via INTRAVENOUS

## 2015-01-04 MED ORDER — SUGAMMADEX SODIUM 200 MG/2ML IV SOLN
INTRAVENOUS | Status: AC
  Filled 2015-01-04: qty 2

## 2015-01-04 MED ORDER — LIDOCAINE HCL (CARDIAC) 20 MG/ML IV SOLN
INTRAVENOUS | Status: AC
  Filled 2015-01-04: qty 10

## 2015-01-04 MED ORDER — ARTIFICIAL TEARS OP OINT
TOPICAL_OINTMENT | OPHTHALMIC | Status: DC | PRN
  Administered 2015-01-04: 1 via OPHTHALMIC

## 2015-01-04 MED ORDER — FUROSEMIDE 40 MG PO TABS: 60.0000 mg | ORAL_TABLET | Freq: Every day | ORAL | Status: DC

## 2015-01-04 MED ORDER — CEFAZOLIN SODIUM-DEXTROSE 2-3 GM-% IV SOLR
2.0000 g | INTRAVENOUS | Status: AC
  Administered 2015-01-04: 2 g via INTRAVENOUS

## 2015-01-04 SURGICAL SUPPLY — 48 items
BLADE SURG 10 STRL SS (BLADE) ×2 IMPLANT
BLADE SURG 15 STRL LF DISP TIS (BLADE) ×1 IMPLANT
BLADE SURG 15 STRL SS (BLADE) ×2
BLADE SURG ROTATE 9660 (MISCELLANEOUS) ×1 IMPLANT
CANISTER SUCTION 2500CC (MISCELLANEOUS) ×2 IMPLANT
CHLORAPREP W/TINT 26ML (MISCELLANEOUS) ×2 IMPLANT
COVER SURGICAL LIGHT HANDLE (MISCELLANEOUS) ×2 IMPLANT
DRAIN PENROSE 1/2X12 LTX STRL (WOUND CARE) ×1 IMPLANT
DRAPE LAPAROTOMY TRNSV 102X78 (DRAPE) ×2 IMPLANT
DRAPE UTILITY XL STRL (DRAPES) ×3 IMPLANT
ELECT CAUTERY BLADE 6.4 (BLADE) ×2 IMPLANT
ELECT REM PT RETURN 9FT ADLT (ELECTROSURGICAL) ×2
ELECTRODE REM PT RTRN 9FT ADLT (ELECTROSURGICAL) ×1 IMPLANT
GLOVE BIO SURGEON STRL SZ7 (GLOVE) ×1 IMPLANT
GLOVE BIOGEL PI IND STRL 7.0 (GLOVE) IMPLANT
GLOVE BIOGEL PI INDICATOR 7.0 (GLOVE) ×1
GLOVE EUDERMIC 7 POWDERFREE (GLOVE) ×2 IMPLANT
GLOVE SURG SS PI 7.5 STRL IVOR (GLOVE) ×2 IMPLANT
GOWN STRL REUS W/ TWL LRG LVL3 (GOWN DISPOSABLE) ×1 IMPLANT
GOWN STRL REUS W/ TWL XL LVL3 (GOWN DISPOSABLE) ×1 IMPLANT
GOWN STRL REUS W/TWL LRG LVL3 (GOWN DISPOSABLE) ×2
GOWN STRL REUS W/TWL XL LVL3 (GOWN DISPOSABLE) ×2
KIT BASIN OR (CUSTOM PROCEDURE TRAY) ×2 IMPLANT
KIT ROOM TURNOVER OR (KITS) ×2 IMPLANT
LIQUID BAND (GAUZE/BANDAGES/DRESSINGS) ×2 IMPLANT
MESH ULTRAPRO 3X6 7.6X15CM (Mesh General) ×1 IMPLANT
NDL HYPO 25GX1X1/2 BEV (NEEDLE) ×1 IMPLANT
NEEDLE HYPO 25GX1X1/2 BEV (NEEDLE) ×2 IMPLANT
NS IRRIG 1000ML POUR BTL (IV SOLUTION) ×2 IMPLANT
PACK SURGICAL SETUP 50X90 (CUSTOM PROCEDURE TRAY) ×2 IMPLANT
PAD ARMBOARD 7.5X6 YLW CONV (MISCELLANEOUS) ×3 IMPLANT
PENCIL BUTTON HOLSTER BLD 10FT (ELECTRODE) ×2 IMPLANT
SPONGE LAP 18X18 X RAY DECT (DISPOSABLE) ×2 IMPLANT
SUT MNCRL AB 4-0 PS2 18 (SUTURE) ×2 IMPLANT
SUT PROLENE 2 0 CT2 30 (SUTURE) ×6 IMPLANT
SUT SILK 2 0 (SUTURE) ×4
SUT SILK 2 0 SH (SUTURE) ×1 IMPLANT
SUT SILK 2-0 18XBRD TIE 12 (SUTURE) ×1 IMPLANT
SUT VIC AB 2-0 CT1 27 (SUTURE) ×4
SUT VIC AB 2-0 CT1 TAPERPNT 27 (SUTURE) ×1 IMPLANT
SUT VIC AB 3-0 SH 27 (SUTURE) ×2
SUT VIC AB 3-0 SH 27XBRD (SUTURE) ×1 IMPLANT
SYR BULB 3OZ (MISCELLANEOUS) ×2 IMPLANT
SYR CONTROL 10ML LL (SYRINGE) ×2 IMPLANT
TOWEL OR 17X24 6PK STRL BLUE (TOWEL DISPOSABLE) ×1 IMPLANT
TOWEL OR 17X26 10 PK STRL BLUE (TOWEL DISPOSABLE) ×2 IMPLANT
TUBE CONNECTING 12X1/4 (SUCTIONS) ×2 IMPLANT
YANKAUER SUCT BULB TIP NO VENT (SUCTIONS) ×2 IMPLANT

## 2015-01-04 NOTE — Interval H&P Note (Signed)
History and Physical Interval Note:  01/04/2015 6:56 AM  Adam [name]  has presented today for surgery, with the diagnosis of right inguinal hernia  The various methods of treatment have been discussed with the patient and family. After consideration of risks, benefits and other options for treatment, the patient has consented to  Procedure(s):  OPEN REPAIR RIGHT INGUNIAL HERNIA (Right) INSERTION OF MESH (Right) as a surgical intervention .  The patient's history has been reviewed, patient examined, no change in status, stable for surgery.  I have reviewed the patient's chart and labs.  Questions were answered to the patient's satisfaction.     [name]

## 2015-01-04 NOTE — Anesthesia Postprocedure Evaluation (Signed)
  Anesthesia Post-op Note  Patient: Adam [name]  Procedure(s) Performed: Procedure(s):  OPEN REPAIR RIGHT INGUNIAL HERNIA (Right) INSERTION OF MESH (Right)  Patient Location: PACU  Anesthesia Type:General and GA combined with regional for post-op pain  Level of Consciousness: awake, alert  and oriented  Airway and Oxygen Therapy: Patient Spontanous Breathing and Patient connected to nasal cannula oxygen  Post-op Pain: none  Post-op Assessment: Post-op Vital signs reviewed, Patient's Cardiovascular Status Stable, Respiratory Function Stable, Patent Airway and Pain level controlled  Post-op Vital Signs: stable  Last Vitals:  Filed Vitals:   01/04/15 0925  BP: 143/58  Pulse: 45  Temp:   Resp: 16    Complications: No apparent anesthesia complications

## 2015-01-04 NOTE — Addendum Note (Signed)
Addendum  created 01/04/15 1045 by Roberts Gaudy, MD   Modules edited: Anesthesia Blocks and Procedures, Clinical Notes   Clinical Notes:  File: [id number]

## 2015-01-04 NOTE — Anesthesia Preprocedure Evaluation (Signed)
Anesthesia Evaluation  Patient identified by [name], date of birth, ID band Patient awake    Reviewed: Allergy & Precautions, NPO status , Patient's Chart, lab work & pertinent test results  Airway Mallampati: II  TM Distance: >3 FB Neck ROM: Full    Dental  (+) Teeth Intact, Dental Advisory Given   Pulmonary former smoker,  breath sounds clear to auscultation        Cardiovascular hypertension, Rhythm:Regular Rate:Normal     Neuro/Psych    GI/Hepatic   Endo/Other    Renal/GU      Musculoskeletal   Abdominal   Peds  Hematology   Anesthesia Other Findings   Reproductive/Obstetrics                             Anesthesia Physical Anesthesia Plan  ASA: III  Anesthesia Plan: General   Post-op Pain Management:    Induction: Intravenous  Airway Management Planned: Oral ETT  Additional Equipment:   Intra-op Plan:   Post-operative Plan:   Informed Consent: I have reviewed the patients History and Physical, chart, labs and discussed the procedure including the risks, benefits and alternatives for the proposed anesthesia with the patient or authorized representative who has indicated his/her understanding and acceptance.   Dental advisory given  Plan Discussed with: CRNA and Anesthesiologist  Anesthesia Plan Comments:         Anesthesia Quick Evaluation

## 2015-01-04 NOTE — Op Note (Signed)
Patient Name:           Adam [name]   Date of Surgery:        01/04/2015  Pre op Diagnosis:      Right inguinal hernia  Post op Diagnosis:    Right inguinal hernia  Procedure:                 Karl Pock repair of right inguinal hernia with mesh  Surgeon:                     Edsel Petrin. Dalbert Batman, M.D., FACS  Assistant:                      OR staff  Operative Indications:   This is a very pleasant 81 year old gentleman . He was referred by his cardiologist, Dr. Daneen Schick, because of a painful right inguinal hernia. Dr. [name] at [facility] is his PCP. He has no prior history of inguinal hernia. 3 months ago he felt that he pulled something and he has had right groin pain and a bulge ever since. It is reducible when supine. Bothers him more when he is sitting in a chair. He has smaller left inguinal hernia that he can feel but it does not cause any pain. He denies back pain or hip pain. Denies radiculopathy. Has chronic atrial fibrillation on Coumadin. Pacemaker. Endovascular aortic stent placement 3 or 4 years ago by Dr. Bridgett Larsson for abdominal aortic aneurysm. Seems to be doing well from that. Coronary artery disease, status post CABG. Really doing fairly well from a cardiovascular standpoint. History GERD. Hyperlipidemia. Congestive heart failure listed. Last colonoscopy 7-8 years ago. Bilateral total knee replacements. After extensive discussion he decided that he wanted the right inguinal hernia repaired, but did not want surgery on the left side. He is brought to the operating room electively. He has been off Coumadin for 5 days.   Operative Findings:       He had a moderately large direct right inguinal hernia. There was a small bulge lateral to the internal ring cord structures, but not a true hernia sac. This was simply reduce and oversewn with a figure-of-eight suture. The hernia was repaired with a 3" x 6" piece of ultra Pro mesh.  Procedure in  Detail:          A Tapp block was placed by the anesthesiologist. The patient was brought to the operating room and underwent general endotracheal anesthesia. Intravenous antibiotics were given.   surgical timeout was performed. The abdomen and groin and genitalia were prepped and draped in a sterile fashion. 0.5% Marcaine with epinephrine was used as a local infiltration anesthetic into the skin subtenon's tissue.     A transverse incision was made in the right groin. Dissection was carried down to the external oblique which was incised in the direction of its fibers opening up the external inguinal ring. The external oblique was dissected away from the underlying structures and self-retaining retractors were placed. The cord structures were mobilized and encircled with a Penrose drain. A sensory nerve was identified associated with cord structures, traced back to its emergence from the muscle laterally, clamped divided and ligated with 2 silk tie. The redundant nerve was excised medially.  Cremasteric muscle fibers were skeletonized. A small bulge was noted laterally and was reduced and oversewn with Vicryl sutures. Medially I found a large direct hernia bulge which was reduced and then oversewn with  imbricating suture of 2-0 Vicryl. This held the direct bulge in place during the repair. The floor of the inguinal canal was repaired and reinforced with a 3" x 6" piece of ultra Pro mesh. The mesh was trimmed the corners to accommodate the anatomy of the wound. The mesh was sutured in place with running sutures of 2-0 Prolene and interrupted mattress sutures of 2-0 Prolene. The mesh was sutured so as to generously overlap the fascia at the pubic tubercle, then along the inguinal ligament inferiorly. Medially, superiorly, and superiolaterally several mattress sutures of 2-0 Prolene were placed. The mesh was incised laterally so as to wraparound the cord structures at the internal ring. Prolene sutures were placed  laterally. This provided very secure repair and coverage both medial and lateral to the internal ring but allowed an adequate fingertip opening for the cord structures. There was irrigated with saline. Hemostasis was excellent. The external oblique was closed with a running suture of 2-0 Vicryl. Scarpa's fascia was closed with 3-0 Vicryl sutures and the skin closed with a running subcuticular 4-0 Monocryl and Dermabond. The patient tolerated the procedure well was taken to PACU in stable condition. EBL 10 mL. Counts correct. Complications none.         Edsel Petrin. Dalbert Batman, M.D., FACS General and Minimally Invasive Surgery Breast and Colorectal Surgery  01/04/2015 8:47 AM

## 2015-01-04 NOTE — Anesthesia Procedure Notes (Addendum)
Procedure Name: Intubation Date/Time: 01/04/2015 7:38 AM Performed by: Maryland Pink Pre-anesthesia Checklist: Patient identified, Emergency Drugs available, Suction available, Patient being monitored and Timeout performed Patient Re-evaluated:Patient Re-evaluated prior to inductionOxygen Delivery Method: Circle system utilized Preoxygenation: Pre-oxygenation with 100% oxygen Intubation Type: IV induction Ventilation: Mask ventilation without difficulty Laryngoscope Size: Mac and 3 Grade View: Grade I Tube type: Oral Tube size: 7.5 mm Number of attempts: 1 Airway Equipment and Method: Stylet and LTA kit utilized Placement Confirmation: ETT inserted through vocal cords under direct vision,  positive ETCO2 and breath sounds checked- equal and bilateral Secured at: 21 cm Tube secured with: Tape Dental Injury: Teeth and Oropharynx as per pre-operative assessment    Anesthesia Regional Block:  TAP block  Pre-Anesthetic Checklist: ,, timeout performed, Correct Patient, Correct Site, Correct Laterality, Correct Procedure, Correct Position, site marked, Risks and benefits discussed,  Surgical consent,  Pre-op evaluation,  At surgeon's request and post-op pain management  Laterality: Right  Prep: chloraprep       Needles:  Injection technique: Single-shot  Needle Type: Echogenic Stimulator Needle     Needle Length: 9cm 9 cm Needle Gauge: 22 and 22 G    Additional Needles:  Procedures: ultrasound guided (picture in chart) TAP block Narrative:  Start time: 01/04/2015 7:15 AM End time: 01/04/2015 7:20 AM Injection made incrementally with aspirations every 5 mL.  Performed by: Personally   Additional Notes: 30 cc 0.5% bupivacaine with 1:200 Epi injected easily

## 2015-01-04 NOTE — Transfer of Care (Signed)
Immediate Anesthesia Transfer of Care Note  Patient: Adam [name]  Procedure(s) Performed: Procedure(s):  OPEN REPAIR RIGHT INGUNIAL HERNIA (Right) INSERTION OF MESH (Right)  Patient Location: PACU  Anesthesia Type:General, regional  Level of Consciousness: awake, alert  and oriented  Airway & Oxygen Therapy: Patient Spontanous Breathing and Patient connected to nasal cannula oxygen  Post-op Assessment: Report given to RN and Post -op Vital signs reviewed and stable  Post vital signs: Reviewed and stable  Last Vitals:  Filed Vitals:   01/04/15 0613  BP: 129/50  Pulse: 50  Temp: 36.6 C  Resp: 20    Complications: No apparent anesthesia complications

## 2015-01-05 DIAGNOSIS — I2510 Atherosclerotic heart disease of native coronary artery without angina pectoris: Secondary | ICD-10-CM | POA: Diagnosis not present

## 2015-01-05 DIAGNOSIS — K4090 Unilateral inguinal hernia, without obstruction or gangrene, not specified as recurrent: Secondary | ICD-10-CM | POA: Diagnosis not present

## 2015-01-05 DIAGNOSIS — E780 Pure hypercholesterolemia: Secondary | ICD-10-CM | POA: Diagnosis not present

## 2015-01-05 DIAGNOSIS — I4891 Unspecified atrial fibrillation: Secondary | ICD-10-CM | POA: Diagnosis not present

## 2015-01-05 DIAGNOSIS — E785 Hyperlipidemia, unspecified: Secondary | ICD-10-CM | POA: Diagnosis not present

## 2015-01-05 DIAGNOSIS — I5022 Chronic systolic (congestive) heart failure: Secondary | ICD-10-CM | POA: Diagnosis not present

## 2015-01-05 LAB — CUP PACEART REMOTE DEVICE CHECK
Battery Impedance: 128 Ohm
Battery Remaining Longevity: 146 mo
Battery Voltage: 2.79 V
Date Time Interrogation Session: 20160519144602
Lead Channel Impedance Value: 555 Ohm
Lead Channel Sensing Intrinsic Amplitude: 8 mV
Lead Channel Setting Pacing Amplitude: 2.5 V
MDC IDC MSMT LEADCHNL RA IMPEDANCE VALUE: 67 Ohm
MDC IDC MSMT LEADCHNL RV PACING THRESHOLD AMPLITUDE: 1.125 V
MDC IDC MSMT LEADCHNL RV PACING THRESHOLD PULSEWIDTH: 0.4 ms
MDC IDC SET LEADCHNL RV PACING PULSEWIDTH: 0.4 ms
MDC IDC SET LEADCHNL RV SENSING SENSITIVITY: 4 mV
MDC IDC STAT BRADY RV PERCENT PACED: 9 %

## 2015-01-05 MED ORDER — HYDROCODONE-ACETAMINOPHEN 5-325 MG PO TABS: 1.0000 | ORAL_TABLET | ORAL | Status: DC | PRN

## 2015-01-05 NOTE — Discharge Instructions (Signed)
[facility] ° °UMBILICAL OR INGUINAL HERNIA REPAIR: POST OP INSTRUCTIONS ° °Always review your discharge instruction sheet given to you by the facility where your surgery was performed. °IF YOU HAVE DISABILITY OR FAMILY LEAVE FORMS, YOU MUST BRING THEM TO THE OFFICE FOR PROCESSING.   °DO NOT GIVE THEM TO YOUR DOCTOR. ° °1. A  prescription for pain medication may be given to you upon discharge.  Take your pain medication as prescribed, if needed.  If narcotic pain medicine is not needed, then you may take acetaminophen (Tylenol), naprosyn (Alleve) or ibuprofen (Advil) as needed. °2. Take your usually prescribed medications unless otherwise directed. °3. If you need a refill on your pain medication, please contact your pharmacy.  They will contact our office to request authorization. Prescriptions will not be filled after 5 pm or on week-ends. °4. You should follow a light diet the first 24 hours after arrival home, such as soup and crackers, etc.  Be sure to include lots of fluids daily.  Resume your normal diet the day after surgery. °5. Most patients will experience some swelling and bruising around the umbilicus or in the groin and scrotum.  Ice packs and reclining will help.  Swelling and bruising can take several days to resolve.  °6. It is common to experience some constipation if taking pain medication after surgery.  Increasing fluid intake and taking a stool softener (such as Colace) will usually help or prevent this problem from occurring.  A mild laxative (Milk of Magnesia or Miralax) should be taken according to package directions if there are no bowel movements after 48 hours. °7. Unless discharge instructions indicate otherwise, you may remove your bandages 48 hours after surgery, and you may shower at that time.  You may have steri-strips (small skin tapes) in place directly over the incision.  These strips should be left on the skin for 7-10 days and will come off on their own.   If your surgeon used skin glue on the incision, you may shower in 24 hours.  The glue will flake off over the next 2-3 weeks.  Any sutures or staples will be removed at the office during your follow-up visit. °8. ACTIVITIES:  You may resume regular (light) daily activities beginning the next day--such as daily self-care, walking, climbing stairs--gradually increasing activities as tolerated.  You may have sexual intercourse when it is comfortable.  Refrain from any heavy lifting or straining until approved by your doctor. °a. You may drive when you are no longer taking prescription pain medication, you can comfortably wear a seatbelt, and you can safely maneuver your car and apply brakes. °b. RETURN TO WORK:  __________________________________________________________ °9. You should see your doctor in the office for a follow-up appointment approximately 2-3 weeks after your surgery.  Make sure that you call for this appointment within a day or two after you arrive home to insure a convenient appointment time. °10. OTHER INSTRUCTIONS:  __________________________________________________________________________________________________________________________________________________________________________________________  °WHEN TO CALL YOUR DOCTOR: °1. Fever over 101.0 °2. Inability to urinate °3. Nausea and/or vomiting °4. Extreme swelling or bruising °5. Continued bleeding from incision. °6. Increased pain, redness, or drainage from the incision ° °The clinic staff is available to answer your questions during regular business hours.  Please don’t hesitate to call and ask to speak to one of the nurses for clinical concerns.  If you have a medical emergency, go to the nearest emergency room or call 911.  A surgeon from [facility]   is always on call at the hospital ° ° °[address], [address], [place], [place]  [address] ? ° [address] [address], [place], [place]   [address] °[phone] ? [phone] ? FAX  [phone] °Web site: [email or web address] ° ° °

## 2015-01-05 NOTE — Progress Notes (Signed)
1 Day Post-Op  Subjective: Voiding, tol diet, pain controlled, has not walked yet  Objective: Vital signs in last 24 hours: Temp:  [97.2 F (36.2 C)-99 F (37.2 C)] 97.8 F (36.6 C) (05/21 0519) Pulse Rate:  [45-75] 58 (05/21 0519) Resp:  [10-18] 16 (05/21 0519) BP: (110-152)/(35-63) 112/35 mmHg (05/21 0519) SpO2:  [95 %-100 %] 96 % (05/21 0519) Last BM Date:  (pta)  Intake/Output from previous day: 05/20 0701 - 05/21 0700 In: 2747.5 [P.O.:720; I.V.:2027.5] Out: 800 [Urine:800] Intake/Output this shift:    Incision/Wound: clean without infection, mild edema  Lab Results:   Recent Labs  01/03/15 1449 01/04/15 1148  WBC 10.0 11.4*  HGB 12.1* 11.6*  HCT 36.1* 34.0*  PLT 122* 105*   BMET  Recent Labs  01/03/15 1449 01/04/15 1148  NA 139  --   K 4.9  --   CL 102  --   CO2 28  --   GLUCOSE 139*  --   BUN 22*  --   CREATININE 0.94 0.98  CALCIUM 9.4  --    PT/INR  Recent Labs  01/03/15 1449  LABPROT 16.4*  INR 1.31   ABG No results for input(s): PHART, HCO3 in the last 72 hours.  Invalid input(s): PCO2, PO2  Studies/Results: No results found.  Anti-infectives: Anti-infectives    Start     Dose/Rate Route Frequency Ordered Stop   01/04/15 0601  ceFAZolin (ANCEF) IVPB 2 g/50 mL premix  Status:  Discontinued     2 g 100 mL/hr over 30 Minutes Intravenous On call to O.R. 01/04/15 0601 01/04/15 0605   01/04/15 0600  ceFAZolin (ANCEF) IVPB 2 g/50 mL premix     2 g 100 mL/hr over 30 Minutes Intravenous On call to O.R. 01/04/15 0600 01/04/15 0741      Assessment/Plan: Pod 1 rih repair  Po pain meds Can dc home if ambulates today  [facility] 01/05/2015

## 2015-01-07 ENCOUNTER — Telehealth: Payer: Self-pay | Admitting: Interventional Cardiology

## 2015-01-07 ENCOUNTER — Encounter (HOSPITAL_COMMUNITY): Payer: Self-pay | Admitting: General Surgery

## 2015-01-07 NOTE — Telephone Encounter (Signed)
Spoke with spouse and she Wellsite geologist states pt can resume coumadin, per Dr Thompson Caul clearance note he was to restart when safe. Spouse states no change in site since surgery date, thus pt will restart 5mg s QD today and he was follow up on 01/16/15.

## 2015-01-07 NOTE — Telephone Encounter (Signed)
New message      Pt had hernia surgery on 01-04-15.  When should he restart his coumadin?  He has been off of it since 12-28-14.  Please advise

## 2015-01-16 ENCOUNTER — Ambulatory Visit (INDEPENDENT_AMBULATORY_CARE_PROVIDER_SITE_OTHER): Payer: Medicare Other | Admitting: *Deleted

## 2015-01-16 DIAGNOSIS — Z5181 Encounter for therapeutic drug level monitoring: Secondary | ICD-10-CM | POA: Diagnosis not present

## 2015-01-16 DIAGNOSIS — I4891 Unspecified atrial fibrillation: Secondary | ICD-10-CM | POA: Diagnosis not present

## 2015-01-16 DIAGNOSIS — I482 Chronic atrial fibrillation, unspecified: Secondary | ICD-10-CM

## 2015-01-16 LAB — POCT INR: INR: 2.5

## 2015-01-23 ENCOUNTER — Encounter: Payer: Self-pay | Admitting: Cardiology

## 2015-01-25 DIAGNOSIS — A6929 Other conditions associated with Lyme disease: Secondary | ICD-10-CM | POA: Diagnosis not present

## 2015-01-30 ENCOUNTER — Encounter: Payer: Self-pay | Admitting: Internal Medicine

## 2015-01-31 DIAGNOSIS — M1990 Unspecified osteoarthritis, unspecified site: Secondary | ICD-10-CM | POA: Diagnosis not present

## 2015-02-03 ENCOUNTER — Other Ambulatory Visit: Payer: Self-pay | Admitting: Interventional Cardiology

## 2015-02-04 ENCOUNTER — Other Ambulatory Visit: Payer: Self-pay

## 2015-02-04 MED ORDER — FUROSEMIDE 40 MG PO TABS: 60.0000 mg | ORAL_TABLET | Freq: Every day | ORAL | Status: DC

## 2015-02-08 ENCOUNTER — Other Ambulatory Visit: Payer: Self-pay | Admitting: Interventional Cardiology

## 2015-02-22 DIAGNOSIS — N398 Other specified disorders of urinary system: Secondary | ICD-10-CM | POA: Diagnosis not present

## 2015-02-22 DIAGNOSIS — R351 Nocturia: Secondary | ICD-10-CM | POA: Diagnosis not present

## 2015-02-22 DIAGNOSIS — N401 Enlarged prostate with lower urinary tract symptoms: Secondary | ICD-10-CM | POA: Diagnosis not present

## 2015-02-27 ENCOUNTER — Encounter: Payer: Self-pay | Admitting: Internal Medicine

## 2015-02-27 ENCOUNTER — Ambulatory Visit (INDEPENDENT_AMBULATORY_CARE_PROVIDER_SITE_OTHER): Payer: Medicare Other | Admitting: *Deleted

## 2015-02-27 DIAGNOSIS — I4891 Unspecified atrial fibrillation: Secondary | ICD-10-CM

## 2015-02-27 DIAGNOSIS — Z5181 Encounter for therapeutic drug level monitoring: Secondary | ICD-10-CM | POA: Diagnosis not present

## 2015-02-27 DIAGNOSIS — I482 Chronic atrial fibrillation, unspecified: Secondary | ICD-10-CM

## 2015-02-27 LAB — POCT INR: INR: 2.4

## 2015-02-28 DIAGNOSIS — D692 Other nonthrombocytopenic purpura: Secondary | ICD-10-CM | POA: Diagnosis not present

## 2015-02-28 DIAGNOSIS — Z85828 Personal history of other malignant neoplasm of skin: Secondary | ICD-10-CM | POA: Diagnosis not present

## 2015-03-01 ENCOUNTER — Ambulatory Visit: Payer: Medicare Other | Admitting: Vascular Surgery

## 2015-03-01 ENCOUNTER — Other Ambulatory Visit: Payer: Medicare Other

## 2015-03-06 ENCOUNTER — Other Ambulatory Visit: Payer: Self-pay | Admitting: Internal Medicine

## 2015-03-16 DIAGNOSIS — H6123 Impacted cerumen, bilateral: Secondary | ICD-10-CM | POA: Diagnosis not present

## 2015-03-16 DIAGNOSIS — H9192 Unspecified hearing loss, left ear: Secondary | ICD-10-CM | POA: Diagnosis not present

## 2015-04-04 ENCOUNTER — Ambulatory Visit (INDEPENDENT_AMBULATORY_CARE_PROVIDER_SITE_OTHER): Payer: Medicare Other | Admitting: Pharmacist

## 2015-04-04 ENCOUNTER — Ambulatory Visit (HOSPITAL_COMMUNITY): Payer: Medicare Other | Attending: Cardiology

## 2015-04-04 ENCOUNTER — Other Ambulatory Visit: Payer: Self-pay

## 2015-04-04 DIAGNOSIS — I071 Rheumatic tricuspid insufficiency: Secondary | ICD-10-CM | POA: Diagnosis not present

## 2015-04-04 DIAGNOSIS — I517 Cardiomegaly: Secondary | ICD-10-CM | POA: Diagnosis not present

## 2015-04-04 DIAGNOSIS — I5022 Chronic systolic (congestive) heart failure: Secondary | ICD-10-CM

## 2015-04-04 DIAGNOSIS — I482 Chronic atrial fibrillation, unspecified: Secondary | ICD-10-CM

## 2015-04-04 DIAGNOSIS — I509 Heart failure, unspecified: Secondary | ICD-10-CM | POA: Diagnosis present

## 2015-04-04 DIAGNOSIS — Z87891 Personal history of nicotine dependence: Secondary | ICD-10-CM | POA: Insufficient documentation

## 2015-04-04 DIAGNOSIS — I340 Nonrheumatic mitral (valve) insufficiency: Secondary | ICD-10-CM | POA: Insufficient documentation

## 2015-04-04 DIAGNOSIS — I351 Nonrheumatic aortic (valve) insufficiency: Secondary | ICD-10-CM | POA: Insufficient documentation

## 2015-04-04 DIAGNOSIS — I4891 Unspecified atrial fibrillation: Secondary | ICD-10-CM | POA: Diagnosis not present

## 2015-04-04 DIAGNOSIS — Z5181 Encounter for therapeutic drug level monitoring: Secondary | ICD-10-CM | POA: Diagnosis not present

## 2015-04-04 DIAGNOSIS — I10 Essential (primary) hypertension: Secondary | ICD-10-CM | POA: Insufficient documentation

## 2015-04-04 LAB — POCT INR: INR: 2.9

## 2015-04-08 ENCOUNTER — Ambulatory Visit (INDEPENDENT_AMBULATORY_CARE_PROVIDER_SITE_OTHER): Payer: Medicare Other | Admitting: *Deleted

## 2015-04-08 ENCOUNTER — Telehealth: Payer: Self-pay

## 2015-04-08 DIAGNOSIS — I495 Sick sinus syndrome: Secondary | ICD-10-CM

## 2015-04-08 NOTE — Telephone Encounter (Signed)
-----   Message from [name], MD sent at 04/05/2015  5:40 PM EDT ----- Compared to the prior study, the left ventricular function has improved (EF increased from 30% to 40%).

## 2015-04-08 NOTE — Telephone Encounter (Signed)
Pt aware of echo results. Compared to the prior study, the left ventricular function has improved (EF increased from 30% to 40%). Pt appreciative for the call and verbalized understanding.

## 2015-04-09 NOTE — Progress Notes (Signed)
Remote pacemaker transmission.   

## 2015-04-12 LAB — CUP PACEART REMOTE DEVICE CHECK
Battery Impedance: 128 Ohm
Battery Voltage: 2.79 V
Brady Statistic RV Percent Paced: 11 %
Date Time Interrogation Session: 20160822131807
Lead Channel Impedance Value: 502 Ohm
Lead Channel Impedance Value: 67 Ohm
Lead Channel Sensing Intrinsic Amplitude: 8 mV
MDC IDC MSMT BATTERY REMAINING LONGEVITY: 146 mo
MDC IDC MSMT LEADCHNL RV PACING THRESHOLD AMPLITUDE: 1.125 V
MDC IDC MSMT LEADCHNL RV PACING THRESHOLD PULSEWIDTH: 0.4 ms
MDC IDC SET LEADCHNL RV PACING AMPLITUDE: 2.5 V
MDC IDC SET LEADCHNL RV PACING PULSEWIDTH: 0.4 ms
MDC IDC SET LEADCHNL RV SENSING SENSITIVITY: 4 mV

## 2015-04-17 DIAGNOSIS — H02839 Dermatochalasis of unspecified eye, unspecified eyelid: Secondary | ICD-10-CM | POA: Diagnosis not present

## 2015-04-17 DIAGNOSIS — H35363 Drusen (degenerative) of macula, bilateral: Secondary | ICD-10-CM | POA: Diagnosis not present

## 2015-04-17 DIAGNOSIS — H524 Presbyopia: Secondary | ICD-10-CM | POA: Diagnosis not present

## 2015-04-17 DIAGNOSIS — H26492 Other secondary cataract, left eye: Secondary | ICD-10-CM | POA: Diagnosis not present

## 2015-04-17 DIAGNOSIS — H52209 Unspecified astigmatism, unspecified eye: Secondary | ICD-10-CM | POA: Diagnosis not present

## 2015-04-17 DIAGNOSIS — H0019 Chalazion unspecified eye, unspecified eyelid: Secondary | ICD-10-CM | POA: Diagnosis not present

## 2015-04-17 DIAGNOSIS — H3530 Unspecified macular degeneration: Secondary | ICD-10-CM | POA: Diagnosis not present

## 2015-04-17 DIAGNOSIS — Z9841 Cataract extraction status, right eye: Secondary | ICD-10-CM | POA: Diagnosis not present

## 2015-04-17 DIAGNOSIS — Z961 Presence of intraocular lens: Secondary | ICD-10-CM | POA: Diagnosis not present

## 2015-04-17 DIAGNOSIS — H40053 Ocular hypertension, bilateral: Secondary | ICD-10-CM | POA: Diagnosis not present

## 2015-04-17 DIAGNOSIS — H3531 Nonexudative age-related macular degeneration: Secondary | ICD-10-CM | POA: Diagnosis not present

## 2015-04-17 DIAGNOSIS — H43813 Vitreous degeneration, bilateral: Secondary | ICD-10-CM | POA: Diagnosis not present

## 2015-04-17 DIAGNOSIS — Z9842 Cataract extraction status, left eye: Secondary | ICD-10-CM | POA: Diagnosis not present

## 2015-04-17 DIAGNOSIS — H5210 Myopia, unspecified eye: Secondary | ICD-10-CM | POA: Diagnosis not present

## 2015-04-26 ENCOUNTER — Encounter: Payer: Self-pay | Admitting: Cardiology

## 2015-05-02 ENCOUNTER — Encounter: Payer: Self-pay | Admitting: Internal Medicine

## 2015-05-06 ENCOUNTER — Other Ambulatory Visit: Payer: Self-pay | Admitting: Interventional Cardiology

## 2015-05-07 ENCOUNTER — Other Ambulatory Visit: Payer: Self-pay

## 2015-05-07 DIAGNOSIS — Z8719 Personal history of other diseases of the digestive system: Secondary | ICD-10-CM | POA: Diagnosis not present

## 2015-05-07 DIAGNOSIS — I5022 Chronic systolic (congestive) heart failure: Secondary | ICD-10-CM | POA: Diagnosis not present

## 2015-05-07 DIAGNOSIS — I4891 Unspecified atrial fibrillation: Secondary | ICD-10-CM | POA: Diagnosis not present

## 2015-05-07 DIAGNOSIS — Z9889 Other specified postprocedural states: Secondary | ICD-10-CM | POA: Diagnosis not present

## 2015-05-07 DIAGNOSIS — Z950 Presence of cardiac pacemaker: Secondary | ICD-10-CM | POA: Diagnosis not present

## 2015-05-07 DIAGNOSIS — Z7901 Long term (current) use of anticoagulants: Secondary | ICD-10-CM | POA: Diagnosis not present

## 2015-05-07 DIAGNOSIS — Z951 Presence of aortocoronary bypass graft: Secondary | ICD-10-CM | POA: Diagnosis not present

## 2015-05-07 DIAGNOSIS — Z95828 Presence of other vascular implants and grafts: Secondary | ICD-10-CM | POA: Diagnosis not present

## 2015-05-07 DIAGNOSIS — Z96653 Presence of artificial knee joint, bilateral: Secondary | ICD-10-CM | POA: Diagnosis not present

## 2015-05-07 DIAGNOSIS — S76211A Strain of adductor muscle, fascia and tendon of right thigh, initial encounter: Secondary | ICD-10-CM | POA: Diagnosis not present

## 2015-05-07 DIAGNOSIS — I2510 Atherosclerotic heart disease of native coronary artery without angina pectoris: Secondary | ICD-10-CM | POA: Diagnosis not present

## 2015-05-07 DIAGNOSIS — Z5181 Encounter for therapeutic drug level monitoring: Secondary | ICD-10-CM | POA: Diagnosis not present

## 2015-05-07 MED ORDER — WARFARIN SODIUM 5 MG PO TABS: ORAL_TABLET | ORAL | Status: DC

## 2015-05-09 DIAGNOSIS — Z23 Encounter for immunization: Secondary | ICD-10-CM | POA: Diagnosis not present

## 2015-05-16 ENCOUNTER — Ambulatory Visit (INDEPENDENT_AMBULATORY_CARE_PROVIDER_SITE_OTHER): Payer: Medicare Other

## 2015-05-16 DIAGNOSIS — Z5181 Encounter for therapeutic drug level monitoring: Secondary | ICD-10-CM

## 2015-05-16 DIAGNOSIS — I482 Chronic atrial fibrillation, unspecified: Secondary | ICD-10-CM

## 2015-05-16 DIAGNOSIS — I4891 Unspecified atrial fibrillation: Secondary | ICD-10-CM

## 2015-05-16 LAB — POCT INR: INR: 2.7

## 2015-05-17 DIAGNOSIS — D692 Other nonthrombocytopenic purpura: Secondary | ICD-10-CM | POA: Diagnosis not present

## 2015-05-17 DIAGNOSIS — D1801 Hemangioma of skin and subcutaneous tissue: Secondary | ICD-10-CM | POA: Diagnosis not present

## 2015-05-17 DIAGNOSIS — H61002 Unspecified perichondritis of left external ear: Secondary | ICD-10-CM | POA: Diagnosis not present

## 2015-05-17 DIAGNOSIS — L821 Other seborrheic keratosis: Secondary | ICD-10-CM | POA: Diagnosis not present

## 2015-05-17 DIAGNOSIS — D485 Neoplasm of uncertain behavior of skin: Secondary | ICD-10-CM | POA: Diagnosis not present

## 2015-05-17 DIAGNOSIS — Z85828 Personal history of other malignant neoplasm of skin: Secondary | ICD-10-CM | POA: Diagnosis not present

## 2015-05-17 DIAGNOSIS — L570 Actinic keratosis: Secondary | ICD-10-CM | POA: Diagnosis not present

## 2015-05-17 DIAGNOSIS — D225 Melanocytic nevi of trunk: Secondary | ICD-10-CM | POA: Diagnosis not present

## 2015-06-05 ENCOUNTER — Encounter: Payer: Self-pay | Admitting: Interventional Cardiology

## 2015-06-05 ENCOUNTER — Ambulatory Visit (INDEPENDENT_AMBULATORY_CARE_PROVIDER_SITE_OTHER): Payer: Medicare Other | Admitting: Interventional Cardiology

## 2015-06-05 VITALS — BP 122/70 | HR 50 | Ht 68.0 in | Wt 161.0 lb

## 2015-06-05 DIAGNOSIS — I5022 Chronic systolic (congestive) heart failure: Secondary | ICD-10-CM

## 2015-06-05 DIAGNOSIS — I482 Chronic atrial fibrillation, unspecified: Secondary | ICD-10-CM

## 2015-06-05 DIAGNOSIS — I25810 Atherosclerosis of coronary artery bypass graft(s) without angina pectoris: Secondary | ICD-10-CM

## 2015-06-05 DIAGNOSIS — Z7901 Long term (current) use of anticoagulants: Secondary | ICD-10-CM

## 2015-06-05 DIAGNOSIS — I714 Abdominal aortic aneurysm, without rupture, unspecified: Secondary | ICD-10-CM

## 2015-06-05 NOTE — Patient Instructions (Signed)
Medication Instructions:  Your physician recommends that you continue on your current medications as directed. Please refer to the Current Medication list given to you today.   Labwork: Bnp Today   Testing/Procedures: None ordered  Follow-Up: Your physician wants you to follow-up in: 1 year with Dr.Smith You will receive a reminder letter in the mail two months in advance. If you don't receive a letter, please call our office to schedule the follow-up appointment.   Any Other Special Instructions Will Be Listed Below (If Applicable).

## 2015-06-05 NOTE — Progress Notes (Signed)
Cardiology Office Note   Date:  06/05/2015   ID:  Adam [name], DOB [birth year], [name] [id number]  PCP:  [name], MD  Cardiologist:  Sinclair Grooms, MD   Chief Complaint  Patient presents with  . Coronary Artery Disease  . Atrial Fibrillation      History of Present Illness: Adam [name] is a 81 y.o. male who presents for permanent atrial fibrillation, chronic systolic heart failure, coronary artery disease with prior bypass grafting, hypertension, permanent pacemaker therapy, and chronic essential hypertension.  Low back discomfort is the major limiting physical ailment preventing exercise. He does have dyspnea on exertion. He denies orthopnea. No episodes of chest pain. Easy bruising and bleeding but no spontaneous urinary, GI, or ENT bleeding. No transient neurological complaints have occurred.  He has not had head trauma or falls from syncope or otherwise.    Past Medical History  Diagnosis Date  . S/P CABG (coronary artery bypass graft)   . Hyperlipidemia   . GERD (gastroesophageal reflux disease)   . Presence of permanent cardiac pacemaker   . Chronic anticoagulation   . LBP (low back pain)   . AAA (abdominal aortic aneurysm) ([place])   . Diverticulosis   . Arthritis   . 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 Permanent atrial fibrillation ([place])     decision made at [facility]  . CHF (congestive heart failure) ([place])   . Anginal pain ([place])   . Hypertension   . Sleep apnea   . Shortness of breath dyspnea     Past Surgical History  Procedure Laterality Date  . Coronary artery bypass graft  Feb 1989    "CABG X6"  . Cataract extraction w/ intraocular lens  implant, bilateral    . Joint replacement  1995 & 1996    Bilateral total knee replacements  . Endovascular stent insertion  07/28/2011    Procedure: ENDOVASCULAR STENT GRAFT INSERTION;  Surgeon: Hinda Lenis, MD;  Location: [place];  Service: Vascular;  Laterality: N/A;  Pt.  on Coumadin/ instructed to hold 5 days prior to procedure  . Cardioversion  06/30/2012    Procedure: CARDIOVERSION;  Surgeon: Sinclair Grooms, MD;  Location: [place];  Service: Cardiovascular;  Laterality: N/A;  . Cardioversion N/A 11/23/2013    Procedure: CARDIOVERSION;  Surgeon: Sinclair Grooms, MD;  Location[facility] ENDOSCOPY;  Service: Cardiovascular;  Laterality: N/A;  . Permanent pacemaker generator change N/A 11/03/2013    Procedure: PERMANENT PACEMAKER GENERATOR CHANGE;  Surgeon: [name], MD;  Location: [facility] CATH LAB;  Service: Cardiovascular;  Laterality: N/A;  . Left heart catheterization with coronary angiogram N/A 03/28/2014    Procedure: LEFT HEART CATHETERIZATION WITH CORONARY ANGIOGRAM;  Surgeon: Sinclair Grooms, MD;  Location: [facility] CATH LAB;  Service: Cardiovascular;  Laterality: N/A;  . Eye surgery    . Inguinal hernia repair Right 01/04/2015  . Insert / replace / remove pacemaker  July 2008  . Tonsillectomy    . Inguinal hernia repair Right 01/04/2015    Procedure:  OPEN REPAIR RIGHT INGUNIAL HERNIA;  Surgeon: Fanny Skates, MD;  Location: [place];  Service: General;  Laterality: Right;  . Insertion of mesh Right 01/04/2015    Procedure: INSERTION OF MESH;  Surgeon: Fanny Skates, MD;  Location: [place];  Service: General;  Laterality: Right;     Current Outpatient Prescriptions  Medication Sig Dispense Refill  . acetaminophen (TYLENOL) 650  MG CR tablet Take 650 mg by mouth every 8 (eight) hours as needed for pain.     [name] atorvastatin (LIPITOR) 10 MG tablet Take 1 tablet (10 mg total) by mouth daily. 90 tablet 4  . Cholecalciferol (VITAMIN D) 2000 UNITS CAPS Take 2,000 Units by mouth daily.    . furosemide (LASIX) 40 MG tablet Take 1.5 tablets (60 mg total) by mouth daily. 45 tablet 6  . latanoprost (XALATAN) 0.005 % ophthalmic solution Place 1 drop into both eyes at bedtime.    [name] lisinopril (PRINIVIL,ZESTRIL) 2.5 MG tablet Take 2.5 mg by mouth daily.    . metoprolol  tartrate (LOPRESSOR) 25 MG tablet TAKE 1 TABLET BY MOUTH TWICE DAILY WITH FOOD 180 tablet 2  . Multiple Vitamins-Minerals (RA VISION-VITE PRESERVE PO) Take 1 tablet by mouth daily.     . nitroGLYCERIN (NITROSTAT) 0.4 MG SL tablet Place 0.4 mg under the tongue every 5 (five) minutes as needed. For chest pain    . Omega-3 Fatty Acids (FISH OIL) 1000 MG CPDR Take 1 capsule by mouth daily.     . silodosin (RAPAFLO) 8 MG CAPS capsule Take 8 mg by mouth daily at 6 PM.     . warfarin (COUMADIN) 5 MG tablet TAKE AS DIRECTED BY ANTICOAGULATION CLINIC 90 tablet 1   No current facility-administered medications for this visit.    Allergies:   Morphine and Amiodarone    Social History:  The patient  reports that he quit smoking about 66 years ago. His smoking use included Cigarettes. He quit after 3 years of use. He has never used smokeless tobacco. He reports that he does not drink alcohol or use illicit drugs.   Family History:  The patient's family history includes Cancer in his sister; Heart attack in his father; Heart disease in his father and sister; Heart failure in his mother; Other in his sister; Stroke in his mother; Varicose Veins in his mother.    ROS:  Please see the history of present illness.   Otherwise, review of systems are positive for back pain, joint discomfort, easy bruising, leg pain, excessive fatigue, difficulty with balance, difficulty urinating, or major complaints. He is status post right inguinal hernia repair and still having some discomfort..   All other systems are reviewed and negative.    PHYSICAL EXAM: VS:  BP 122/70 mmHg  Pulse 50  Ht 5\' 8"  (1.727 m)  Wt 73.029 kg (161 lb)  BMI 24.49 kg/m2  SpO2 98% , BMI Body mass index is 24.49 kg/(m^2). GEN: Well nourished, well developed, in no acute distress HEENT: normal Neck: no JVD, carotid bruits, or masses Cardiac: IIRR.  There is no murmur, rub, or gallop. There is 1-2+ ankle edema bilaterally. Respiratory:  clear to  auscultation bilaterally, normal work of breathing. GI: soft, nontender, nondistended, + BS MS: no deformity or atrophy Skin: warm and dry, no rash Neuro:  Strength and sensation are intact Psych: euthymic mood, full affect   EKG:  EKG is not ordered today.    Recent Labs: 01/03/2015: ALT 22; BUN 22*; Potassium 4.9; Sodium 139 01/04/2015: Creatinine, Ser 0.98; Hemoglobin 11.6*; Platelets 105*    Lipid Panel No results found for: CHOL, TRIG, HDL, CHOLHDL, VLDL, LDLCALC, LDLDIRECT    Wt Readings from Last 3 Encounters:  06/05/15 73.029 kg (161 lb)  01/04/15 72.576 kg (160 lb)  01/03/15 72.757 kg (160 lb 6.4 oz)      Other studies Reviewed: Additional studies/ records that were reviewed today  include: Past medical records within the [facility] system.. The findings include no new findings.    ASSESSMENT AND PLAN:  1. Atherosclerosis of coronary artery bypass graft of native heart without angina pectoris Asymptomatic  2. Chronic systolic heart failure (HCC) Moderate dyspnea on exertion.  3. Chronic atrial fibrillation (HCC) Present with good rate control  4. AAA (abdominal aortic aneurysm) without rupture (HCC) Followed by vascular surgery and status post repair  5. Long term current use of anticoagulant therapy No bleeding complications other than bothersome bruising.    Current medicines are reviewed at length with the patient today.  The patient has the following concerns regarding medicines: None.  The following changes/actions have been instituted:    Advised that he speak with his primary physician about back discomfort and evaluation  BNP will be done today to determine if slight increase in diuretic therapy but be helpful. Other than lower extremity edema that is no evidence of volume overload.  Labs/ tests ordered today include:  No orders of the defined types were placed in this encounter.     Disposition:   FU with HS in 1 year  Signed, Sinclair Grooms, MD  06/05/2015 9:56 AM    [facility] [address], [place], [place]  [address] Phone: [phone]; Fax: [phone]

## 2015-06-06 LAB — BRAIN NATRIURETIC PEPTIDE: BRAIN NATRIURETIC PEPTIDE: 148.4 pg/mL — AB (ref 0.0–100.0)

## 2015-07-03 ENCOUNTER — Ambulatory Visit (INDEPENDENT_AMBULATORY_CARE_PROVIDER_SITE_OTHER): Payer: Medicare Other | Admitting: Internal Medicine

## 2015-07-03 ENCOUNTER — Encounter: Payer: Medicare Other | Admitting: Internal Medicine

## 2015-07-03 ENCOUNTER — Encounter: Payer: Self-pay | Admitting: Internal Medicine

## 2015-07-03 ENCOUNTER — Ambulatory Visit (INDEPENDENT_AMBULATORY_CARE_PROVIDER_SITE_OTHER): Payer: Medicare Other | Admitting: *Deleted

## 2015-07-03 VITALS — BP 98/58 | HR 52 | Ht 69.0 in | Wt 159.8 lb

## 2015-07-03 DIAGNOSIS — I4891 Unspecified atrial fibrillation: Secondary | ICD-10-CM

## 2015-07-03 DIAGNOSIS — I482 Chronic atrial fibrillation, unspecified: Secondary | ICD-10-CM

## 2015-07-03 DIAGNOSIS — I25810 Atherosclerosis of coronary artery bypass graft(s) without angina pectoris: Secondary | ICD-10-CM | POA: Diagnosis not present

## 2015-07-03 DIAGNOSIS — I5022 Chronic systolic (congestive) heart failure: Secondary | ICD-10-CM | POA: Diagnosis not present

## 2015-07-03 DIAGNOSIS — Z951 Presence of aortocoronary bypass graft: Secondary | ICD-10-CM

## 2015-07-03 DIAGNOSIS — Z5181 Encounter for therapeutic drug level monitoring: Secondary | ICD-10-CM

## 2015-07-03 DIAGNOSIS — Z950 Presence of cardiac pacemaker: Secondary | ICD-10-CM | POA: Diagnosis not present

## 2015-07-03 LAB — CUP PACEART INCLINIC DEVICE CHECK
Battery Impedance: 128 Ohm
Battery Remaining Longevity: 145 mo
Battery Voltage: 2.79 V
Brady Statistic RV Percent Paced: 12 %
Date Time Interrogation Session: 20161116140509
Implantable Lead Implant Date: 20080818
Implantable Lead Implant Date: 20080818
Implantable Lead Location: 753859
Implantable Lead Location: 753860
Implantable Lead Model: 4469
Implantable Lead Model: 4470
Implantable Lead Serial Number: 499409
Implantable Lead Serial Number: 602985
Lead Channel Impedance Value: 478 Ohm
Lead Channel Impedance Value: 67 Ohm
Lead Channel Pacing Threshold Amplitude: 1 V
Lead Channel Pacing Threshold Amplitude: 1.125 V
Lead Channel Pacing Threshold Pulse Width: 0.4 ms
Lead Channel Pacing Threshold Pulse Width: 0.4 ms
Lead Channel Sensing Intrinsic Amplitude: 11.2 mV
Lead Channel Setting Pacing Amplitude: 2.5 V
Lead Channel Setting Pacing Pulse Width: 0.4 ms
Lead Channel Setting Sensing Sensitivity: 4 mV

## 2015-07-03 LAB — POCT INR: INR: 2.5

## 2015-07-03 NOTE — Progress Notes (Signed)
Patient Care Team: [name], MD as PCP - General (Obstetrics and Gynecology) [name], MD (Cardiology) [name], MD (Cardiology) [name], MD as Consulting Physician (Cardiology)   HPI  Adam [name] is a 81 y.o. 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  Recurrent atrial fibrillation in late March and underwent repeat cardioversion. He is been seen by Dr. Omelia [name] at [facility]. Most recently he was seen October 2015 at which time because of persistent and now permanent atrial fibrillation a discussion was had regarding CRT upgrade.should note that rate response on his pacemaker was inactivated. He felt much better with this.  He has had some peripheral edema.he has had some exercise intolerance manifested by fatigue in his legs. This is stable.  Echocardiogram [facility] 2015 demonstrated severe LV dysfunction with EF of 25%. This represents a severe interval decrease.    Nodes from [place] 03/16 were reviewed and were discussions were had including CRT upgrade His bundle pacing, or ICD upgrade.he has not been back to [facility] since and has not impression, that he will have his device follow-up here.     Past Medical History  Diagnosis Date  . S/P CABG (coronary artery bypass graft)   . Hyperlipidemia   . GERD (gastroesophageal reflux disease)   . Presence of permanent cardiac pacemaker   . Chronic anticoagulation   . LBP (low back pain)   . AAA (abdominal aortic aneurysm) ([place])   . Diverticulosis   . Arthritis   . CAD (coronary artery disease)     [name]  . Permanent atrial fibrillation ([place])     decision made at [facility]  . CHF (congestive heart failure) ([place])   . Anginal pain ([place])   . Hypertension   .  Sleep apnea   . Shortness of breath dyspnea     Past Surgical History  Procedure Laterality Date  . Coronary artery bypass graft  Feb 1989    "CABG X6"  . Cataract extraction w/ intraocular lens  implant, bilateral    . Joint replacement  1995 & 1996    Bilateral total knee replacements  . Endovascular stent insertion  07/28/2011    Procedure: ENDOVASCULAR STENT GRAFT INSERTION;  Surgeon: Adam Lenis, MD;  Location: [place];  Service: Vascular;  Laterality: N/A;  Pt. on Coumadin/ instructed to hold 5 days prior to procedure  . Cardioversion  06/30/2012    Procedure: CARDIOVERSION;  Surgeon: Adam Grooms, MD;  Location: [place];  Service: Cardiovascular;  Laterality: N/A;  . Cardioversion N/A 11/23/2013    Procedure: CARDIOVERSION;  Surgeon: Adam Grooms, MD;  Location: [facility] ENDOSCOPY;  Service: Cardiovascular;  Laterality: N/A;  . Permanent pacemaker generator change N/A 11/03/2013    Procedure: PERMANENT PACEMAKER GENERATOR CHANGE;  Surgeon: [name], MD;  Location: [facility] CATH LAB;  Service: Cardiovascular;  Laterality: N/A;  . Left heart catheterization with coronary angiogram N/A 03/28/2014    Procedure: LEFT HEART CATHETERIZATION WITH CORONARY ANGIOGRAM;  Surgeon: Adam Grooms, MD;  Location: [facility] CATH LAB;  Service: Cardiovascular;  Laterality: N/A;  . Eye surgery    . Inguinal hernia repair Right 01/04/2015  . Insert / replace /  remove pacemaker  July 2008  . Tonsillectomy    . Inguinal hernia repair Right 01/04/2015    Procedure:  OPEN REPAIR RIGHT INGUNIAL HERNIA;  Surgeon: Adam Skates, MD;  Location: [place];  Service: General;  Laterality: Right;  . Insertion of mesh Right 01/04/2015    Procedure: INSERTION OF MESH;  Surgeon: Adam Skates, MD;  Location: [place];  Service: General;  Laterality: Right;    Current Outpatient Prescriptions  Medication Sig Dispense Refill  . acetaminophen (TYLENOL) 650 MG CR tablet Take 650 mg by mouth every 8 (eight) hours  as needed for pain.     Adam [name] atorvastatin (LIPITOR) 10 MG tablet Take 1 tablet (10 mg total) by mouth daily. 90 tablet 4  . Cholecalciferol (VITAMIN D) 2000 UNITS CAPS Take 2,000 Units by mouth daily.    . furosemide (LASIX) 40 MG tablet Take 1.5 tablets (60 mg total) by mouth daily. 45 tablet 6  . latanoprost (XALATAN) 0.005 % ophthalmic solution Place 1 drop into both eyes at bedtime.    Adam [name] lisinopril (PRINIVIL,ZESTRIL) 2.5 MG tablet Take 2.5 mg by mouth daily.    . metoprolol tartrate (LOPRESSOR) 25 MG tablet TAKE 1 TABLET BY MOUTH TWICE DAILY WITH FOOD 180 tablet 2  . Multiple Vitamins-Minerals (RA VISION-VITE PRESERVE PO) Take 1 tablet by mouth daily.     . nitroGLYCERIN (NITROSTAT) 0.4 MG SL tablet Place 0.4 mg under the tongue every 5 (five) minutes as needed. For chest pain    . Omega-3 Fatty Acids (FISH OIL) 1000 MG CPDR Take 1 capsule by mouth daily.     . silodosin (RAPAFLO) 8 MG CAPS capsule Take 8 mg by mouth daily at 6 PM.     . warfarin (COUMADIN) 5 MG tablet TAKE AS DIRECTED BY ANTICOAGULATION CLINIC 90 tablet 1   No current facility-administered medications for this visit.    Allergies  Allergen Reactions  . Morphine Nausea Only  . Amiodarone Anxiety and Rash    Review of Systems negative except from HPI and PMH  Physical Exam BP 98/58 mmHg  Pulse 52  Ht 5\' 9"  (1.753 m)  Wt 159 lb 12.8 oz (72.485 kg)  BMI 23.59 kg/m2  SpO2 98% Well developed and well nourished in no acute distress HENT normal E scleral and icterus clear Neck Supple JVP 6-7; carotids brisk and full Clear to ausculation Regular rate and rhythm, no murmurs gallops or rub Soft with active bowel sounds No clubbing cyanosis 1+ Edema He has a intradermal hematoma over his left shin. There is no surrounding erythema. There is no oozing Alert and oriented, grossly normal motor and sensory function Skin Warm and Dry  ECG Afib  V pacing    Assessment and  Plan  bradycardia  Atrial  fibrillation-permanent  Ischemic cardiomyopathy  HFrEF  Bruise left shin with intradermal hematoma  Pacemaker-[employer]    Without symptoms of ischemia  We'll continue to follow his pacemaker here. It is programmed appropriately. He is ventricularly pacing 11% of the time. I suspect, that most of this patient is at night hence, I do not think that there would be valuable in CRT upgrade. Further I don't think there is a value and His bundle pacing. There there were previous discussions regarding ICD implantation and upgrade; this octogenarian almost nonagenarian I think the decision to not embark seems reasonable.  He has a little bit of volume overload. I've asked him to increase his diuretic from 60--80 on an as-needed basis.  Heart rate  excursion is adequate; I suspect his exercise intolerance is a manifestation of heart failure. I will ask Dr. Tam[place] [name] whether he thinks digoxin might be of some benefit. There would be a trade-off between this and perhaps increased ventricular pacing

## 2015-07-03 NOTE — Patient Instructions (Signed)
Medication Instructions:  Your physician recommends that you continue on your current medications as directed. Please refer to the Current Medication list given to you today.   Labwork: none  Testing/Procedures: none  Follow-Up: Remote monitoring is used to monitor your Pacemaker of ICD from home. This monitoring reduces the number of office visits required to check your device to one time per year. It allows us to keep an eye on the functioning of your device to ensure it is working properly. You are scheduled for a device check from home on 10/02/15. You may send your transmission at any time that day. If you have a wireless device, the transmission will be sent automatically. After your physician reviews your transmission, you will receive a postcard with your next transmission date.  Your physician wants you to follow-up in: 12 months.  You will receive a reminder letter in the mail two months in advance. If you don't receive a letter, please call our office to schedule the follow-up appointment.   Any Other Special Instructions Will Be Listed Below (If Applicable).     If you need a refill on your cardiac medications before your next appointment, please call your pharmacy.   

## 2015-07-04 DIAGNOSIS — R58 Hemorrhage, not elsewhere classified: Secondary | ICD-10-CM | POA: Diagnosis not present

## 2015-07-04 DIAGNOSIS — L299 Pruritus, unspecified: Secondary | ICD-10-CM | POA: Diagnosis not present

## 2015-07-22 ENCOUNTER — Encounter: Payer: Self-pay | Admitting: Internal Medicine

## 2015-08-02 DIAGNOSIS — R0981 Nasal congestion: Secondary | ICD-10-CM | POA: Diagnosis not present

## 2015-08-02 DIAGNOSIS — J029 Acute pharyngitis, unspecified: Secondary | ICD-10-CM | POA: Diagnosis not present

## 2015-08-02 DIAGNOSIS — J209 Acute bronchitis, unspecified: Secondary | ICD-10-CM | POA: Diagnosis not present

## 2015-08-02 DIAGNOSIS — Z87891 Personal history of nicotine dependence: Secondary | ICD-10-CM | POA: Diagnosis not present

## 2015-08-02 DIAGNOSIS — R05 Cough: Secondary | ICD-10-CM | POA: Diagnosis not present

## 2015-08-06 DIAGNOSIS — J329 Chronic sinusitis, unspecified: Secondary | ICD-10-CM | POA: Diagnosis not present

## 2015-08-12 ENCOUNTER — Other Ambulatory Visit: Payer: Self-pay | Admitting: Interventional Cardiology

## 2015-08-13 ENCOUNTER — Other Ambulatory Visit: Payer: Self-pay | Admitting: Pharmacist

## 2015-08-14 ENCOUNTER — Ambulatory Visit (INDEPENDENT_AMBULATORY_CARE_PROVIDER_SITE_OTHER): Payer: Medicare Other | Admitting: Pharmacist

## 2015-08-14 DIAGNOSIS — Z5181 Encounter for therapeutic drug level monitoring: Secondary | ICD-10-CM

## 2015-08-14 DIAGNOSIS — I482 Chronic atrial fibrillation, unspecified: Secondary | ICD-10-CM

## 2015-08-14 DIAGNOSIS — I4891 Unspecified atrial fibrillation: Secondary | ICD-10-CM

## 2015-08-14 LAB — POCT INR: INR: 3.1

## 2015-08-30 DIAGNOSIS — R05 Cough: Secondary | ICD-10-CM | POA: Diagnosis not present

## 2015-09-11 ENCOUNTER — Ambulatory Visit (INDEPENDENT_AMBULATORY_CARE_PROVIDER_SITE_OTHER): Payer: Medicare Other | Admitting: *Deleted

## 2015-09-11 DIAGNOSIS — I482 Chronic atrial fibrillation, unspecified: Secondary | ICD-10-CM

## 2015-09-11 DIAGNOSIS — Z5181 Encounter for therapeutic drug level monitoring: Secondary | ICD-10-CM | POA: Diagnosis not present

## 2015-09-11 DIAGNOSIS — I4891 Unspecified atrial fibrillation: Secondary | ICD-10-CM | POA: Diagnosis not present

## 2015-09-11 LAB — POCT INR: INR: 2.2

## 2015-09-25 ENCOUNTER — Other Ambulatory Visit: Payer: Self-pay | Admitting: Interventional Cardiology

## 2015-09-27 DIAGNOSIS — N398 Other specified disorders of urinary system: Secondary | ICD-10-CM | POA: Diagnosis not present

## 2015-09-27 DIAGNOSIS — N401 Enlarged prostate with lower urinary tract symptoms: Secondary | ICD-10-CM | POA: Diagnosis not present

## 2015-10-02 ENCOUNTER — Ambulatory Visit (INDEPENDENT_AMBULATORY_CARE_PROVIDER_SITE_OTHER): Payer: Medicare Other | Admitting: *Deleted

## 2015-10-02 DIAGNOSIS — I495 Sick sinus syndrome: Secondary | ICD-10-CM

## 2015-10-02 NOTE — Progress Notes (Signed)
Remote pacemaker transmission.   

## 2015-10-07 LAB — CUP PACEART REMOTE DEVICE CHECK
Battery Remaining Longevity: 140 mo
Battery Voltage: 2.79 V
Date Time Interrogation Session: 20170215152449
Implantable Lead Implant Date: 20080818
Implantable Lead Location: 753860
Implantable Lead Model: 4469
Implantable Lead Model: 4470
Implantable Lead Serial Number: 499409
Implantable Lead Serial Number: 602985
Lead Channel Pacing Threshold Amplitude: 1 V
Lead Channel Sensing Intrinsic Amplitude: 8 mV
Lead Channel Setting Pacing Pulse Width: 0.4 ms
MDC IDC LEAD IMPLANT DT: 20080818
MDC IDC LEAD LOCATION: 753859
MDC IDC MSMT BATTERY IMPEDANCE: 153 Ohm
MDC IDC MSMT LEADCHNL RA IMPEDANCE VALUE: 67 Ohm
MDC IDC MSMT LEADCHNL RV IMPEDANCE VALUE: 490 Ohm
MDC IDC MSMT LEADCHNL RV PACING THRESHOLD PULSEWIDTH: 0.4 ms
MDC IDC SET LEADCHNL RV PACING AMPLITUDE: 2.5 V
MDC IDC SET LEADCHNL RV SENSING SENSITIVITY: 4 mV
MDC IDC STAT BRADY RV PERCENT PACED: 10 %

## 2015-10-09 ENCOUNTER — Ambulatory Visit (INDEPENDENT_AMBULATORY_CARE_PROVIDER_SITE_OTHER): Payer: Medicare Other | Admitting: *Deleted

## 2015-10-09 DIAGNOSIS — Z5181 Encounter for therapeutic drug level monitoring: Secondary | ICD-10-CM | POA: Diagnosis not present

## 2015-10-09 DIAGNOSIS — I482 Chronic atrial fibrillation, unspecified: Secondary | ICD-10-CM

## 2015-10-09 DIAGNOSIS — I4891 Unspecified atrial fibrillation: Secondary | ICD-10-CM | POA: Diagnosis not present

## 2015-10-09 LAB — POCT INR: INR: 2.5

## 2015-10-11 ENCOUNTER — Encounter: Payer: Self-pay | Admitting: Cardiology

## 2015-11-06 DIAGNOSIS — Z961 Presence of intraocular lens: Secondary | ICD-10-CM | POA: Diagnosis not present

## 2015-11-06 DIAGNOSIS — H40003 Preglaucoma, unspecified, bilateral: Secondary | ICD-10-CM | POA: Diagnosis not present

## 2015-11-06 DIAGNOSIS — H0019 Chalazion unspecified eye, unspecified eyelid: Secondary | ICD-10-CM | POA: Diagnosis not present

## 2015-11-06 DIAGNOSIS — Z9842 Cataract extraction status, left eye: Secondary | ICD-10-CM | POA: Diagnosis not present

## 2015-11-06 DIAGNOSIS — H353130 Nonexudative age-related macular degeneration, bilateral, stage unspecified: Secondary | ICD-10-CM | POA: Diagnosis not present

## 2015-11-06 DIAGNOSIS — H43813 Vitreous degeneration, bilateral: Secondary | ICD-10-CM | POA: Diagnosis not present

## 2015-11-06 DIAGNOSIS — H35363 Drusen (degenerative) of macula, bilateral: Secondary | ICD-10-CM | POA: Diagnosis not present

## 2015-11-06 DIAGNOSIS — Z9841 Cataract extraction status, right eye: Secondary | ICD-10-CM | POA: Diagnosis not present

## 2015-11-06 DIAGNOSIS — H3530 Unspecified macular degeneration: Secondary | ICD-10-CM | POA: Diagnosis not present

## 2015-11-06 DIAGNOSIS — Z888 Allergy status to other drugs, medicaments and biological substances status: Secondary | ICD-10-CM | POA: Diagnosis not present

## 2015-11-06 DIAGNOSIS — Z885 Allergy status to narcotic agent status: Secondary | ICD-10-CM | POA: Diagnosis not present

## 2015-11-06 DIAGNOSIS — H40053 Ocular hypertension, bilateral: Secondary | ICD-10-CM | POA: Diagnosis not present

## 2015-11-06 DIAGNOSIS — H02839 Dermatochalasis of unspecified eye, unspecified eyelid: Secondary | ICD-10-CM | POA: Diagnosis not present

## 2015-11-08 ENCOUNTER — Other Ambulatory Visit: Payer: Self-pay | Admitting: *Deleted

## 2015-11-08 MED ORDER — ATORVASTATIN CALCIUM 10 MG PO TABS: 10.0000 mg | ORAL_TABLET | Freq: Every day | ORAL | Status: DC

## 2015-11-15 DIAGNOSIS — D225 Melanocytic nevi of trunk: Secondary | ICD-10-CM | POA: Diagnosis not present

## 2015-11-15 DIAGNOSIS — L821 Other seborrheic keratosis: Secondary | ICD-10-CM | POA: Diagnosis not present

## 2015-11-15 DIAGNOSIS — D692 Other nonthrombocytopenic purpura: Secondary | ICD-10-CM | POA: Diagnosis not present

## 2015-11-15 DIAGNOSIS — L718 Other rosacea: Secondary | ICD-10-CM | POA: Diagnosis not present

## 2015-11-15 DIAGNOSIS — D1801 Hemangioma of skin and subcutaneous tissue: Secondary | ICD-10-CM | POA: Diagnosis not present

## 2015-11-15 DIAGNOSIS — Z85828 Personal history of other malignant neoplasm of skin: Secondary | ICD-10-CM | POA: Diagnosis not present

## 2015-11-15 DIAGNOSIS — L814 Other melanin hyperpigmentation: Secondary | ICD-10-CM | POA: Diagnosis not present

## 2015-11-15 DIAGNOSIS — L570 Actinic keratosis: Secondary | ICD-10-CM | POA: Diagnosis not present

## 2015-11-20 ENCOUNTER — Ambulatory Visit (INDEPENDENT_AMBULATORY_CARE_PROVIDER_SITE_OTHER): Payer: Medicare Other | Admitting: *Deleted

## 2015-11-20 DIAGNOSIS — I4891 Unspecified atrial fibrillation: Secondary | ICD-10-CM | POA: Diagnosis not present

## 2015-11-20 DIAGNOSIS — Z5181 Encounter for therapeutic drug level monitoring: Secondary | ICD-10-CM | POA: Diagnosis not present

## 2015-11-20 DIAGNOSIS — I482 Chronic atrial fibrillation, unspecified: Secondary | ICD-10-CM

## 2015-11-20 LAB — POCT INR: INR: 2.6

## 2015-12-04 ENCOUNTER — Other Ambulatory Visit: Payer: Self-pay | Admitting: Internal Medicine

## 2015-12-05 ENCOUNTER — Other Ambulatory Visit: Payer: Self-pay | Admitting: Internal Medicine

## 2015-12-10 ENCOUNTER — Encounter: Payer: Self-pay | Admitting: Family

## 2015-12-12 DIAGNOSIS — E785 Hyperlipidemia, unspecified: Secondary | ICD-10-CM | POA: Diagnosis not present

## 2015-12-12 DIAGNOSIS — J329 Chronic sinusitis, unspecified: Secondary | ICD-10-CM | POA: Diagnosis not present

## 2015-12-12 DIAGNOSIS — R413 Other amnesia: Secondary | ICD-10-CM | POA: Diagnosis not present

## 2015-12-12 DIAGNOSIS — R5383 Other fatigue: Secondary | ICD-10-CM | POA: Diagnosis not present

## 2015-12-12 DIAGNOSIS — Z7901 Long term (current) use of anticoagulants: Secondary | ICD-10-CM | POA: Diagnosis not present

## 2015-12-12 DIAGNOSIS — K469 Unspecified abdominal hernia without obstruction or gangrene: Secondary | ICD-10-CM | POA: Diagnosis not present

## 2015-12-12 DIAGNOSIS — Z79899 Other long term (current) drug therapy: Secondary | ICD-10-CM | POA: Diagnosis not present

## 2015-12-12 DIAGNOSIS — I10 Essential (primary) hypertension: Secondary | ICD-10-CM | POA: Diagnosis not present

## 2015-12-12 DIAGNOSIS — M549 Dorsalgia, unspecified: Secondary | ICD-10-CM | POA: Diagnosis not present

## 2015-12-12 DIAGNOSIS — Z0000 Encounter for general adult medical examination without abnormal findings: Secondary | ICD-10-CM | POA: Diagnosis not present

## 2015-12-13 ENCOUNTER — Ambulatory Visit (HOSPITAL_COMMUNITY)
Admission: RE | Admit: 2015-12-13 | Discharge: 2015-12-13 | Disposition: A | Discharge status: Home / Self Care | Payer: Medicare Other | Source: Ambulatory Visit | Attending: Family | Admitting: Family

## 2015-12-13 ENCOUNTER — Encounter: Payer: Self-pay | Admitting: Family

## 2015-12-13 ENCOUNTER — Other Ambulatory Visit: Payer: Self-pay | Admitting: Vascular Surgery

## 2015-12-13 ENCOUNTER — Ambulatory Visit (INDEPENDENT_AMBULATORY_CARE_PROVIDER_SITE_OTHER): Payer: Medicare Other | Admitting: Family

## 2015-12-13 VITALS — BP 122/61 | HR 42 | Temp 97.3°F | Resp 12 | Ht 69.0 in | Wt 160.4 lb

## 2015-12-13 DIAGNOSIS — Z9889 Other specified postprocedural states: Secondary | ICD-10-CM

## 2015-12-13 DIAGNOSIS — I714 Abdominal aortic aneurysm, without rupture, unspecified: Secondary | ICD-10-CM

## 2015-12-13 DIAGNOSIS — K219 Gastro-esophageal reflux disease without esophagitis: Secondary | ICD-10-CM | POA: Insufficient documentation

## 2015-12-13 DIAGNOSIS — Z48812 Encounter for surgical aftercare following surgery on the circulatory system: Secondary | ICD-10-CM | POA: Diagnosis not present

## 2015-12-13 DIAGNOSIS — Z8679 Personal history of other diseases of the circulatory system: Secondary | ICD-10-CM

## 2015-12-13 DIAGNOSIS — E785 Hyperlipidemia, unspecified: Secondary | ICD-10-CM | POA: Diagnosis not present

## 2015-12-13 DIAGNOSIS — Z95828 Presence of other vascular implants and grafts: Secondary | ICD-10-CM | POA: Diagnosis not present

## 2015-12-13 DIAGNOSIS — I509 Heart failure, unspecified: Secondary | ICD-10-CM | POA: Insufficient documentation

## 2015-12-13 DIAGNOSIS — I110 Hypertensive heart disease with heart failure: Secondary | ICD-10-CM | POA: Diagnosis not present

## 2015-12-13 DIAGNOSIS — I2510 Atherosclerotic heart disease of native coronary artery without angina pectoris: Secondary | ICD-10-CM | POA: Insufficient documentation

## 2015-12-13 NOTE — Progress Notes (Signed)
VASCULAR [facility] OF [place]    CC: Follow up s/p EVAR  History of Present Illness  Adam [name] is a 82 y.o. (12-26-1924) male patient of Dr. Bridgett Larsson who presents for routine follow up s/p EVAR (Date: 07/28/11). Most recent EVAR duplex (Date: 12/10/14) demonstrates: no endoleak and stable sac size. Most recent CTA (Date: 08/25/13) demonstrates: small Type 2 endoleak and shrinking sac size. The patient has had chronic back pain for "all my life", states he has an appointment next week to have his spine evaluated. He denies abdominal pain. He had a right herniorrhaphy in May 2016.  Pt Diabetic: No Pt smoker: former smoker, quit in 1955, smoked for 3 years  He takes coumadin for atrial fib, also has a pacemaker. He takes a beta blocker and a statin.   Past Medical History  Diagnosis Date  . S/P CABG (coronary artery bypass graft)   . Hyperlipidemia   . GERD (gastroesophageal reflux disease)   . Presence of permanent cardiac pacemaker   . Chronic anticoagulation   . LBP (low back pain)   . AAA (abdominal aortic aneurysm) ([place])   . Diverticulosis   . Arthritis   . 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 Permanent atrial fibrillation ([place])     decision made at [facility]  . CHF (congestive heart failure) ([place])   . Anginal pain ([place])   . Hypertension   . Sleep apnea   . Shortness of breath dyspnea    Past Surgical History  Procedure Laterality Date  . Coronary artery bypass graft  Feb 1989    "CABG X6"  . Cataract extraction w/ intraocular lens  implant, bilateral    . Joint replacement  1995 & 1996    Bilateral total knee replacements  . Endovascular stent insertion  07/28/2011    Procedure: ENDOVASCULAR STENT GRAFT INSERTION;  Surgeon: Hinda Lenis, MD;  Location: [place];  Service: Vascular;  Laterality: N/A;  Pt. on Coumadin/ instructed to hold 5 days prior to procedure  . Cardioversion  06/30/2012    Procedure: CARDIOVERSION;  Surgeon:  Sinclair Grooms, MD;  Location: [place];  Service: Cardiovascular;  Laterality: N/A;  . Cardioversion N/A 11/23/2013    Procedure: CARDIOVERSION;  Surgeon: Sinclair Grooms, MD;  Location: [facility] ENDOSCOPY;  Service: Cardiovascular;  Laterality: N/A;  . Permanent pacemaker generator change N/A 11/03/2013    Procedure: PERMANENT PACEMAKER GENERATOR CHANGE;  Surgeon: [name], MD;  Location: [facility] CATH LAB;  Service: Cardiovascular;  Laterality: N/A;  . Left heart catheterization with coronary angiogram N/A 03/28/2014    Procedure: LEFT HEART CATHETERIZATION WITH CORONARY ANGIOGRAM;  Surgeon: Sinclair Grooms, MD;  Location: [facility] CATH LAB;  Service: Cardiovascular;  Laterality: N/A;  . Eye surgery    . Inguinal hernia repair Right 01/04/2015  . Insert / replace / remove pacemaker  July 2008  . Tonsillectomy    . Inguinal hernia repair Right 01/04/2015    Procedure:  OPEN REPAIR RIGHT INGUNIAL HERNIA;  Surgeon: Fanny Skates, MD;  Location: [place];  Service: General;  Laterality: Right;  . Insertion of mesh Right 01/04/2015    Procedure: INSERTION OF MESH;  Surgeon: Fanny Skates, MD;  Location: [place];  Service: General;  Laterality: Right;   Social History Social History  Substance Use Topics  . Smoking status: Former Smoker -- 3 years    Types: Cigarettes    Quit date:  08/17/1948  . Smokeless tobacco: Never Used  . Alcohol Use: No   Family History Family History  Problem Relation Age of Onset  . Stroke Mother   . Heart failure Mother   . Varicose Veins Mother   . Heart attack Father   . Heart disease Father     Before age 26    Heart Attack age 41 and 85  . Cancer Sister     breast  . Other Sister     DJD  . Heart disease Sister     After age 75   Current Outpatient Prescriptions on File Prior to Visit  Medication Sig Dispense Refill  . acetaminophen (TYLENOL) 650 MG CR tablet Take 650 mg by mouth every 8 (eight) hours as needed for pain.     [name] atorvastatin (LIPITOR) 10 MG  tablet Take 1 tablet (10 mg total) by mouth daily. 90 tablet 2  . Cholecalciferol (VITAMIN D) 2000 UNITS CAPS Take 2,000 Units by mouth daily.    . furosemide (LASIX) 40 MG tablet TAKE 1.5 TABLETS (60 MG TOTAL) BY MOUTH DAILY. 135 tablet 2  . latanoprost (XALATAN) 0.005 % ophthalmic solution Place 1 drop into both eyes at bedtime.    [name] lisinopril (PRINIVIL,ZESTRIL) 2.5 MG tablet Take 2.5 mg by mouth daily.    . metoprolol tartrate (LOPRESSOR) 25 MG tablet TAKE 1 TABLET BY MOUTH TWICE DAILY WITH FOOD 180 tablet 1  . Multiple Vitamins-Minerals (RA VISION-VITE PRESERVE PO) Take 1 tablet by mouth daily.     . nitroGLYCERIN (NITROSTAT) 0.4 MG SL tablet Place 0.4 mg under the tongue every 5 (five) minutes as needed. For chest pain    . Omega-3 Fatty Acids (FISH OIL) 1000 MG CPDR Take 1 capsule by mouth daily.     . silodosin (RAPAFLO) 8 MG CAPS capsule Take 8 mg by mouth daily at 6 PM.     . warfarin (COUMADIN) 5 MG tablet TAKE AS DIRECTED BY ANTICOAGULATION CLINIC 90 tablet 0   No current facility-administered medications on file prior to visit.   Allergies  Allergen Reactions  . Morphine Nausea Only  . Amiodarone Anxiety and Rash     ROS: See HPI for pertinent positives and negatives.  Physical Examination  Filed Vitals:   12/13/15 0849  BP: 122/61  Pulse: 42  Temp: 97.3 F (36.3 C)  TempSrc: Oral  Resp: 12  Height: 5\' 9"  (1.753 m)  Weight: 160 lb 6.4 oz (72.757 kg)  SpO2: 99%   Body mass index is 23.68 kg/(m^2).  General: A&O x 3, WD, elderly and thin  Pulmonary: Sym exp, respirations are non labored, good air movt, CTAB Cardiac: RRR, Nl S1, S2, no detected murmur  Vascular: Vessel Right Left  Radial Palpable Palpable  Brachial Palpable Palpable  Carotid Palpable, without bruit Palpable, without bruit  Aorta Not palpable N/A  Femoral Palpable Palpable  Popliteal Not palpable Not palpable  PT Palpable Palpable  DP Palpable Palpable    Gastrointestinal: soft, NTND, -G/R, - HSM, - palpable masses, - CVAT B, impulse palpable over inguinal ring with valsalva manuever  Musculoskeletal: M/S 5/5 throughout , Extremities without ischemic changes   Neurologic: Pain and light touch intact in extremities , Motor exam as listed above         Non-Invasive Vascular Imaging  EVAR Duplex (Date: 12/13/2015)  AAA sac size: 4.67 cm x 4.63 cm  Previous (12/10/14): 4.65 cm x 4.58 cm  no endoleak detected  CTA Abd/Pelvis Duplex (Date: 08/25/13)  Similar atherosclerotic changes of the suprarenal aorta. Patient is status post aortic stent graft for an infrarenal abdominal aortic aneurysm with a bifurcated stent graft extending into the common iliac arteries bilaterally. Endograft remains patent and stable in position. The graft extends from below the renal arteries into the common iliac arteries. Negative for migration or occlusion. No intraluminal thrombus. Stable type 2 endoleak suspect related to residual patent lumbar arteries. Stable native aneurysm sac diameter measuring 5.0 x 4.7 cm at a similar level. Previously negative aneurysm sac measurement was 4.8 x 5.0 cm. No evidence of retroperitoneal hemorrhage or hematoma.   Medical Decision Making  Adam [name] is a 82 y.o. male who presents s/p EVAR (Date: 07/28/11).  Pt is asymptomatic with stable sac size, no endoleak.  I discussed with the patient the importance of surveillance of the endograft.  I spoke with Dr. Bridgett Larsson. No evidence of endoleak on EVAR duplex today. There was an endoleak on CTA in 2015.  Will obtain CTA abd/pelvis in a year, pt to see me for follow up on a day that Dr. Bridgett Larsson is in the office.   Thank you for allowing [place] to participate in this patient's care.  Clemon Chambers, RN, MSN, FNP-C [facility] [place] Office: [phone]  Clinic Physician: Bridgett Larsson  12/13/2015, 8:59 AM

## 2015-12-18 DIAGNOSIS — M545 Low back pain: Secondary | ICD-10-CM | POA: Diagnosis not present

## 2015-12-20 DIAGNOSIS — M545 Low back pain: Secondary | ICD-10-CM | POA: Diagnosis not present

## 2015-12-25 DIAGNOSIS — M545 Low back pain: Secondary | ICD-10-CM | POA: Diagnosis not present

## 2016-01-01 ENCOUNTER — Telehealth: Payer: Self-pay | Admitting: Cardiology

## 2016-01-01 ENCOUNTER — Ambulatory Visit (INDEPENDENT_AMBULATORY_CARE_PROVIDER_SITE_OTHER): Payer: Medicare Other | Admitting: *Deleted

## 2016-01-01 DIAGNOSIS — M545 Low back pain: Secondary | ICD-10-CM | POA: Diagnosis not present

## 2016-01-01 DIAGNOSIS — I495 Sick sinus syndrome: Secondary | ICD-10-CM

## 2016-01-01 NOTE — Progress Notes (Signed)
Remote pacemaker transmission.   

## 2016-01-01 NOTE — Telephone Encounter (Signed)
Confirmed remote transmission w/ pt wife.   

## 2016-01-06 DIAGNOSIS — M545 Low back pain: Secondary | ICD-10-CM | POA: Diagnosis not present

## 2016-01-08 DIAGNOSIS — M545 Low back pain: Secondary | ICD-10-CM | POA: Diagnosis not present

## 2016-01-15 DIAGNOSIS — M545 Low back pain: Secondary | ICD-10-CM | POA: Diagnosis not present

## 2016-01-17 ENCOUNTER — Ambulatory Visit (INDEPENDENT_AMBULATORY_CARE_PROVIDER_SITE_OTHER): Payer: Medicare Other | Admitting: *Deleted

## 2016-01-17 DIAGNOSIS — I482 Chronic atrial fibrillation, unspecified: Secondary | ICD-10-CM

## 2016-01-17 DIAGNOSIS — I4891 Unspecified atrial fibrillation: Secondary | ICD-10-CM | POA: Diagnosis not present

## 2016-01-17 DIAGNOSIS — Z5181 Encounter for therapeutic drug level monitoring: Secondary | ICD-10-CM

## 2016-01-17 LAB — POCT INR: INR: 2.3

## 2016-01-21 DIAGNOSIS — M545 Low back pain: Secondary | ICD-10-CM | POA: Diagnosis not present

## 2016-01-22 ENCOUNTER — Encounter: Payer: Self-pay | Admitting: Cardiology

## 2016-01-23 LAB — CUP PACEART REMOTE DEVICE CHECK
Battery Remaining Longevity: 134 mo
Battery Voltage: 2.79 V
Brady Statistic RV Percent Paced: 12 %
Date Time Interrogation Session: 20170517141515
Implantable Lead Location: 753859
Implantable Lead Location: 753860
Implantable Lead Model: 4470
Implantable Lead Serial Number: 499409
Implantable Lead Serial Number: 602985
Lead Channel Impedance Value: 472 Ohm
Lead Channel Impedance Value: 67 Ohm
Lead Channel Pacing Threshold Amplitude: 1.125 V
Lead Channel Pacing Threshold Pulse Width: 0.4 ms
Lead Channel Sensing Intrinsic Amplitude: 8 mV
Lead Channel Setting Pacing Amplitude: 2.5 V
Lead Channel Setting Pacing Pulse Width: 0.4 ms
Lead Channel Setting Sensing Sensitivity: 4 mV
MDC IDC LEAD IMPLANT DT: 20080818
MDC IDC LEAD IMPLANT DT: 20080818
MDC IDC MSMT BATTERY IMPEDANCE: 177 Ohm

## 2016-01-29 DIAGNOSIS — M545 Low back pain: Secondary | ICD-10-CM | POA: Diagnosis not present

## 2016-02-19 DIAGNOSIS — M545 Low back pain: Secondary | ICD-10-CM | POA: Diagnosis not present

## 2016-02-28 ENCOUNTER — Ambulatory Visit (INDEPENDENT_AMBULATORY_CARE_PROVIDER_SITE_OTHER): Payer: Medicare Other | Admitting: *Deleted

## 2016-02-28 DIAGNOSIS — Z5181 Encounter for therapeutic drug level monitoring: Secondary | ICD-10-CM

## 2016-02-28 DIAGNOSIS — I482 Chronic atrial fibrillation, unspecified: Secondary | ICD-10-CM

## 2016-02-28 DIAGNOSIS — I4891 Unspecified atrial fibrillation: Secondary | ICD-10-CM

## 2016-02-28 LAB — POCT INR: INR: 2.3

## 2016-03-31 ENCOUNTER — Telehealth: Payer: Self-pay | Admitting: Interventional Cardiology

## 2016-03-31 NOTE — Telephone Encounter (Signed)
New Message  Pts wife states she feels pt needs to see MD [name] due to pts health declining.  Pts wife expressed he's very weak but he does not have any pain.  No SOB & Chest Pain.  Please follow up with pt. Thanks!

## 2016-03-31 NOTE — Telephone Encounter (Signed)
Returned patients wifes call. Mrs. Greim sts that the pt is having increased fatigue, similar to what he had in the past when he had blockage in his heart. Pt denies chest pain, increased sob, swelling, fever, nausea. He was seen 1 months ago by his pcp, no changes were made. Pt would like to have his appt moved up with Dr.Smith. appt scheduled for 8/22 @ 2:30pm with Dr.Smith. Adv that they call back if any cardiac symptoms develop. Pt's wife voiced appreciation for the call back and verbalized understanding.

## 2016-04-01 ENCOUNTER — Ambulatory Visit (INDEPENDENT_AMBULATORY_CARE_PROVIDER_SITE_OTHER): Payer: Medicare Other | Admitting: *Deleted

## 2016-04-01 DIAGNOSIS — I495 Sick sinus syndrome: Secondary | ICD-10-CM

## 2016-04-01 NOTE — Progress Notes (Signed)
Remote pacemaker transmission.   

## 2016-04-02 ENCOUNTER — Encounter: Payer: Self-pay | Admitting: Cardiology

## 2016-04-03 DIAGNOSIS — N398 Other specified disorders of urinary system: Secondary | ICD-10-CM | POA: Diagnosis not present

## 2016-04-03 DIAGNOSIS — N401 Enlarged prostate with lower urinary tract symptoms: Secondary | ICD-10-CM | POA: Diagnosis not present

## 2016-04-07 ENCOUNTER — Encounter: Payer: Self-pay | Admitting: Interventional Cardiology

## 2016-04-07 ENCOUNTER — Ambulatory Visit (INDEPENDENT_AMBULATORY_CARE_PROVIDER_SITE_OTHER): Payer: Medicare Other | Admitting: Interventional Cardiology

## 2016-04-07 VITALS — BP 120/60 | HR 55 | Ht 68.0 in | Wt 160.4 lb

## 2016-04-07 DIAGNOSIS — I25708 Atherosclerosis of coronary artery bypass graft(s), unspecified, with other forms of angina pectoris: Secondary | ICD-10-CM

## 2016-04-07 DIAGNOSIS — I482 Chronic atrial fibrillation, unspecified: Secondary | ICD-10-CM

## 2016-04-07 DIAGNOSIS — R413 Other amnesia: Secondary | ICD-10-CM

## 2016-04-07 DIAGNOSIS — I119 Hypertensive heart disease without heart failure: Secondary | ICD-10-CM | POA: Diagnosis not present

## 2016-04-07 DIAGNOSIS — I5022 Chronic systolic (congestive) heart failure: Secondary | ICD-10-CM | POA: Diagnosis not present

## 2016-04-07 DIAGNOSIS — Z950 Presence of cardiac pacemaker: Secondary | ICD-10-CM

## 2016-04-07 DIAGNOSIS — Z8679 Personal history of other diseases of the circulatory system: Secondary | ICD-10-CM

## 2016-04-07 DIAGNOSIS — Z9889 Other specified postprocedural states: Secondary | ICD-10-CM

## 2016-04-07 LAB — COMPREHENSIVE METABOLIC PANEL
ALK PHOS: 67 U/L (ref 40–115)
ALT: 15 U/L (ref 9–46)
AST: 22 U/L (ref 10–35)
Albumin: 4.2 g/dL (ref 3.6–5.1)
BUN: 25 mg/dL (ref 7–25)
CALCIUM: 9 mg/dL (ref 8.6–10.3)
CHLORIDE: 101 mmol/L (ref 98–110)
CO2: 28 mmol/L (ref 20–31)
Creat: 1.07 mg/dL (ref 0.70–1.11)
GLUCOSE: 111 mg/dL — AB (ref 65–99)
POTASSIUM: 4.5 mmol/L (ref 3.5–5.3)
Sodium: 142 mmol/L (ref 135–146)
Total Bilirubin: 0.7 mg/dL (ref 0.2–1.2)
Total Protein: 6.8 g/dL (ref 6.1–8.1)

## 2016-04-07 NOTE — Patient Instructions (Addendum)
Medication Instructions:  Your physician recommends that you continue on your current medications as directed. Please refer to the Current Medication list given to you today.   Labwork: TODAY;  BNP & CMET   Testing/Procedures: Your physician has requested that you have an echocardiogram. Echocardiography is a painless test that uses sound waves to create images of your heart. It provides your doctor with information about the size and shape of your heart and how well your heart's chambers and valves are working. This procedure takes approximately one hour. There are no restrictions for this procedure.    Follow-Up: Your physician wants you to follow-up in:  SEE DR. [name] AS PLANNED    Any Other Special Instructions Will Be Listed Below (If Applicable).  Echocardiogram An echocardiogram, or echocardiography, uses sound waves (ultrasound) to produce an image of your heart. The echocardiogram is simple, painless, obtained within a short period of time, and offers valuable information to your health care provider. The images from an echocardiogram can provide information such as:  Evidence of coronary artery disease (CAD).  Heart size.  Heart muscle function.  Heart valve function.  Aneurysm detection.  Evidence of a past heart attack.  Fluid buildup around the heart.  Heart muscle thickening.  Assess heart valve function. LET [facility] CARE PROVIDER KNOW ABOUT:  Any allergies you have.  All medicines you are taking, including vitamins, herbs, eye drops, creams, and over-the-counter medicines.  Previous problems you or members of your family have had with the use of anesthetics.  Any blood disorders you have.  Previous surgeries you have had.  Medical conditions you have.  Possibility of pregnancy, if this applies. BEFORE THE PROCEDURE  No special preparation is needed. Eat and drink normally.  PROCEDURE   In order to produce an image of your heart, gel will be  applied to your chest and a wand-like tool (transducer) will be moved over your chest. The gel will help transmit the sound waves from the transducer. The sound waves will harmlessly bounce off your heart to allow the heart images to be captured in real-time motion. These images will then be recorded.  You may need an IV to receive a medicine that improves the quality of the pictures. AFTER THE PROCEDURE You may return to your normal schedule including diet, activities, and medicines, unless your health care provider tells you otherwise.   This information is not intended to replace advice given to you by your health care provider. Make sure you discuss any questions you have with your health care provider.   Document Released: 07/31/2000 Document Revised: 08/24/2014 Document Reviewed: 04/10/2013 Elsevier Interactive Patient Education [employer].   If you need a refill on your cardiac medications before your next appointment, please call your pharmacy.

## 2016-04-07 NOTE — Progress Notes (Signed)
So he has elected needed on his blood were   Cardiology Office Note    Date:  04/07/2016   ID:  Adam [name], DOB 26-Oct-1924, [name] [id number]  His is PCP:  Adam Battiest, MD  Cardiologist: Adam Grooms, MD   Chief Complaint  Patient presents with  . Congestive Heart Failure  . Coronary Artery Disease    History of Present Illness:  Adam [name] is a 82 y.o. male cardiomyopathy, chronic systolic heart failure, chronic atrial fibrillation, CAD with bypass graft failure (saphenous vein graft to the obtuse marginal), hypertension, chronic anticoagulation therapy, hypertension, and hyperlipidemia.   Progressive dyspnea on exertion and some orthopnea. He denies chest discomfort. He has not needed nitroglycerin. No significant lower extremity swelling. Still tries to swim but notices increasing shortness of breath. He has not had syncope.  He feels weak all the time and wonders if his pacemaker can be reprogrammed so that he will have more energy.      Past Medical History:  Diagnosis Date  . AAA (abdominal aortic aneurysm) ([place])   . Anginal pain ([place])   . Arthritis   . CAD (coronary artery disease)    Adam [name] IS CARDIO  . CHF (congestive heart failure) ([place])   . Chronic anticoagulation   . Diverticulosis   . GERD (gastroesophageal reflux disease)   . Hyperlipidemia   . Hypertension   . LBP (low back pain)   . Permanent atrial fibrillation ([place])    decision made at [facility]  . Presence of permanent cardiac pacemaker   . S/P CABG (coronary artery bypass graft)   . Shortness of breath dyspnea   . Sleep apnea     Past Surgical History:  Procedure Laterality Date  . CARDIOVERSION  06/30/2012   Procedure: CARDIOVERSION;  Surgeon: Adam Grooms, MD;  Location: [facility] ENDOSCOPY;  Service: Cardiovascular;  Laterality: N/A;  . CARDIOVERSION N/A 11/23/2013   Procedure: CARDIOVERSION;  Surgeon: Adam Grooms, MD;  Location: [place];  Service: Cardiovascular;   Laterality: N/A;  . CATARACT EXTRACTION W/ INTRAOCULAR LENS  IMPLANT, BILATERAL    . CORONARY ARTERY BYPASS GRAFT  Feb 1989   "CABG X6"  . ENDOVASCULAR STENT INSERTION  07/28/2011   Procedure: ENDOVASCULAR STENT GRAFT INSERTION;  Surgeon: Adam Lenis, MD;  Location: [place];  Service: Vascular;  Laterality: N/A;  Pt. on Coumadin/ instructed to hold 5 days prior to procedure  . EYE SURGERY    . INGUINAL HERNIA REPAIR Right 01/04/2015  . INGUINAL HERNIA REPAIR Right 01/04/2015   Procedure:  OPEN REPAIR RIGHT INGUNIAL HERNIA;  Surgeon: Adam Skates, MD;  Location: [place];  Service: General;  Laterality: Right;  . INSERT / REPLACE / REMOVE PACEMAKER  July 2008  . INSERTION OF MESH Right 01/04/2015   Procedure: INSERTION OF MESH;  Surgeon: Adam Skates, MD;  Location: [place];  Service: General;  Laterality: Right;  . JOINT REPLACEMENT  1995 & 1996   Bilateral total knee replacements  . LEFT HEART CATHETERIZATION WITH CORONARY ANGIOGRAM N/A 03/28/2014   Procedure: LEFT HEART CATHETERIZATION WITH CORONARY ANGIOGRAM;  Surgeon: Adam Grooms, MD;  Location: [facility] CATH LAB;  Service: Cardiovascular;  Laterality: N/A;  . PERMANENT PACEMAKER GENERATOR CHANGE N/A 11/03/2013   Procedure: PERMANENT PACEMAKER GENERATOR CHANGE;  Surgeon: Adam Sprang, MD;  Location: [facility] CATH LAB;  Service: Cardiovascular;  Laterality: N/A;  . TONSILLECTOMY      Current Medications: Outpatient Medications Prior to  Visit  Medication Sig Dispense Refill  . acetaminophen (TYLENOL) 650 MG CR tablet Take 650 mg by mouth every 8 (eight) hours as needed for pain.     [name] atorvastatin (LIPITOR) 10 MG tablet Take 1 tablet (10 mg total) by mouth daily. 90 tablet 2  . Cholecalciferol (VITAMIN D) 2000 UNITS CAPS Take 2,000 Units by mouth daily.    . furosemide (LASIX) 40 MG tablet TAKE 1.5 TABLETS (60 MG TOTAL) BY MOUTH DAILY. 135 tablet 2  . latanoprost (XALATAN) 0.005 % ophthalmic solution Place 1 drop into both eyes at bedtime.     [name] lisinopril (PRINIVIL,ZESTRIL) 2.5 MG tablet Take 2.5 mg by mouth daily.    . metoprolol tartrate (LOPRESSOR) 25 MG tablet TAKE 1 TABLET BY MOUTH TWICE DAILY WITH FOOD 180 tablet 1  . Multiple Vitamins-Minerals (RA VISION-VITE PRESERVE PO) Take 1 tablet by mouth daily.     . nitroGLYCERIN (NITROSTAT) 0.4 MG SL tablet Place 0.4 mg under the tongue every 5 (five) minutes as needed. For chest pain    . Omega-3 Fatty Acids (FISH OIL) 1000 MG CPDR Take 1 capsule by mouth daily.     . silodosin (RAPAFLO) 8 MG CAPS capsule Take 8 mg by mouth daily at 6 PM.     . warfarin (COUMADIN) 5 MG tablet TAKE AS DIRECTED BY ANTICOAGULATION CLINIC 90 tablet 0   No facility-administered medications prior to visit.      Allergies:   Morphine and Amiodarone   Social History   Social History  . Marital status: Married    Spouse name: N/A  . Number of children: N/A  . Years of education: N/A   Social History Main Topics  . Smoking status: Former Smoker    Years: 3.00    Types: Cigarettes    Quit date: 08/17/1948  . Smokeless tobacco: Never Used  . Alcohol use No  . Drug use: No  . Sexual activity: Not Asked   Other Topics Concern  . None   Social History Narrative  . None     Family History:  The patient's family history includes Cancer in his sister; Heart attack in his father; Heart disease in his father and sister; Heart failure in his mother; Other in his sister; Stroke in his mother; Varicose Veins in his mother.   ROS:   Please see the history of present illness.    Dyspnea with walking, excessive fatigue, difficulty sleeping.   All other systems reviewed and are negative.   PHYSICAL EXAM:   VS:  BP 120/60   Pulse (!) 55   Ht 5\' 8"  (1.727 m)   Wt 160 lb 6.4 oz (72.8 kg)   BMI 24.39 kg/m    GEN: Well nourished, well developed, in no acute distress  HEENT: normal  Neck: no JVD, carotid bruits, or masses Cardiac: IIRR; 1/6 systolic murmur; no rubs, or gallops,no edema    Respiratory:  clear to auscultation bilaterally, normal work of breathing GI: soft, nontender, nondistended, + BS MS: no deformity or atrophy  Skin: warm and dry, no rash Neuro:  Alert and Oriented x 3, Strength and sensation are intact Psych: euthymic mood, full affect  Wt Readings from Last 3 Encounters:  04/07/16 160 lb 6.4 oz (72.8 kg)  12/13/15 160 lb 6.4 oz (72.8 kg)  07/03/15 159 lb 12.8 oz (72.5 kg)      Studies/Labs Reviewed:   EKG:  EKG  Atrial fibrillation with slow ventricular response at 55 bpm. Nonspecific T  wave flattening. Otherwise unremarkable.  Recent Labs: 06/05/2015: Brain Natriuretic Peptide 148.4   Lipid Panel No results found for: CHOL, TRIG, HDL, CHOLHDL, VLDL, LDLCALC, LDLDIRECT  Additional studies/ records that were reviewed today include:  View the prior echo where year off was 30-35%. No other recent data including laboratory.     ASSESSMENT:    1. Atherosclerosis of coronary artery bypass graft of native heart with other forms of angina pectoris ([place])   2. HYPERTENSION, HEART CONTROLLED W/O ASSOC CHF   3. Chronic atrial fibrillation ([place])   4. Chronic systolic heart failure ([place])   5. PACEMAKER, PERMANENT-[employer]   6. S/P AAA (abdominal aortic aneurysm) repair   7. Memory loss      PLAN:  In order of problems listed above:  1. Asymptomatic 2. Well controlled 3. Slow ventricular heart rate. May need to consider decreasing. 4. Increased dyspnea may be related to clinically unrecognized volume overload. Reassess LV function by echo. Check BNP and kidney function with CMET.    Medication Adjustments/Labs and Tests Ordered: Current medicines are reviewed at length with the patient today.  Concerns regarding medicines are outlined above.  Medication changes, Labs and Tests ordered today are listed in the Patient Instructions below. Patient Instructions  Medication Instructions:  Your physician recommends that you continue on your  current medications as directed. Please refer to the Current Medication list given to you today.   Labwork: TODAY;  BNP & CMET   Testing/Procedures: Your physician has requested that you have an echocardiogram. Echocardiography is a painless test that uses sound waves to create images of your heart. It provides your doctor with information about the size and shape of your heart and how well your heart's chambers and valves are working. This procedure takes approximately one hour. There are no restrictions for this procedure.    Follow-Up: Your physician wants you to follow-up in:  SEE DR. [name] AS PLANNED    Any Other Special Instructions Will Be Listed Below (If Applicable).  Echocardiogram An echocardiogram, or echocardiography, uses sound waves (ultrasound) to produce an image of your heart. The echocardiogram is simple, painless, obtained within a short period of time, and offers valuable information to your health care provider. The images from an echocardiogram can provide information such as:  Evidence of coronary artery disease (CAD).  Heart size.  Heart muscle function.  Heart valve function.  Aneurysm detection.  Evidence of a past heart attack.  Fluid buildup around the heart.  Heart muscle thickening.  Assess heart valve function. LET [facility] CARE PROVIDER KNOW ABOUT:  Any allergies you have.  All medicines you are taking, including vitamins, herbs, eye drops, creams, and over-the-counter medicines.  Previous problems you or members of your family have had with the use of anesthetics.  Any blood disorders you have.  Previous surgeries you have had.  Medical conditions you have.  Possibility of pregnancy, if this applies. BEFORE THE PROCEDURE  No special preparation is needed. Eat and drink normally.  PROCEDURE   In order to produce an image of your heart, gel will be applied to your chest and a wand-like tool (transducer) will be moved over  your chest. The gel will help transmit the sound waves from the transducer. The sound waves will harmlessly bounce off your heart to allow the heart images to be captured in real-time motion. These images will then be recorded.  You may need an IV to receive a medicine that improves the quality of the  pictures. AFTER THE PROCEDURE You may return to your normal schedule including diet, activities, and medicines, unless your health care provider tells you otherwise.   This information is not intended to replace advice given to you by your health care provider. Make sure you discuss any questions you have with your health care provider.   Document Released: 07/31/2000 Document Revised: 08/24/2014 Document Reviewed: 04/10/2013 Elsevier Interactive Patient Education [employer].   If you need a refill on your cardiac medications before your next appointment, please call your pharmacy.      Signed, Adam Grooms, MD  04/07/2016 5:02 PM    [facility] [place], [place], [place]  [address] Phone: [phone]; Fax: [phone]

## 2016-04-08 LAB — BRAIN NATRIURETIC PEPTIDE: Brain Natriuretic Peptide: 162.8 pg/mL — ABNORMAL HIGH (ref <100)

## 2016-04-09 ENCOUNTER — Other Ambulatory Visit: Payer: Self-pay

## 2016-04-09 ENCOUNTER — Ambulatory Visit (HOSPITAL_COMMUNITY): Payer: Medicare Other | Attending: Cardiology

## 2016-04-09 ENCOUNTER — Ambulatory Visit (INDEPENDENT_AMBULATORY_CARE_PROVIDER_SITE_OTHER): Payer: Medicare Other

## 2016-04-09 DIAGNOSIS — E785 Hyperlipidemia, unspecified: Secondary | ICD-10-CM | POA: Insufficient documentation

## 2016-04-09 DIAGNOSIS — I110 Hypertensive heart disease with heart failure: Secondary | ICD-10-CM | POA: Diagnosis not present

## 2016-04-09 DIAGNOSIS — I482 Chronic atrial fibrillation, unspecified: Secondary | ICD-10-CM

## 2016-04-09 DIAGNOSIS — I509 Heart failure, unspecified: Secondary | ICD-10-CM | POA: Diagnosis present

## 2016-04-09 DIAGNOSIS — Z87891 Personal history of nicotine dependence: Secondary | ICD-10-CM | POA: Insufficient documentation

## 2016-04-09 DIAGNOSIS — I714 Abdominal aortic aneurysm, without rupture: Secondary | ICD-10-CM | POA: Diagnosis not present

## 2016-04-09 DIAGNOSIS — I351 Nonrheumatic aortic (valve) insufficiency: Secondary | ICD-10-CM | POA: Diagnosis not present

## 2016-04-09 DIAGNOSIS — I2510 Atherosclerotic heart disease of native coronary artery without angina pectoris: Secondary | ICD-10-CM | POA: Diagnosis not present

## 2016-04-09 DIAGNOSIS — Z5181 Encounter for therapeutic drug level monitoring: Secondary | ICD-10-CM | POA: Diagnosis not present

## 2016-04-09 DIAGNOSIS — G4733 Obstructive sleep apnea (adult) (pediatric): Secondary | ICD-10-CM | POA: Insufficient documentation

## 2016-04-09 DIAGNOSIS — I4891 Unspecified atrial fibrillation: Secondary | ICD-10-CM | POA: Insufficient documentation

## 2016-04-09 DIAGNOSIS — Z8249 Family history of ischemic heart disease and other diseases of the circulatory system: Secondary | ICD-10-CM | POA: Diagnosis not present

## 2016-04-09 DIAGNOSIS — I071 Rheumatic tricuspid insufficiency: Secondary | ICD-10-CM | POA: Insufficient documentation

## 2016-04-09 DIAGNOSIS — I5022 Chronic systolic (congestive) heart failure: Secondary | ICD-10-CM | POA: Insufficient documentation

## 2016-04-09 DIAGNOSIS — I272 Other secondary pulmonary hypertension: Secondary | ICD-10-CM | POA: Insufficient documentation

## 2016-04-09 LAB — POCT INR: INR: 2.5

## 2016-04-13 ENCOUNTER — Telehealth: Payer: Self-pay

## 2016-04-13 NOTE — Telephone Encounter (Signed)
-----   Message from [name], MD sent at 04/12/2016  7:22 PM EDT ----- Let the patient know that his heart is weak but unchanged from prior study. Therefore, stable heart c/w last year.

## 2016-04-13 NOTE — Telephone Encounter (Signed)
Called to give pt lab and echo results. Spoke with pt. Pt sts that he does not hear well and would prefer I speak with his wife who is not home and wont be home until after office hours. Adv pt that I will attempt to call back another day.

## 2016-04-15 NOTE — Telephone Encounter (Signed)
Spoke with pt wife. Pt wife aware of lab and echo results with verbal understanding.

## 2016-04-15 NOTE — Telephone Encounter (Signed)
F/u Message  Pt wife call requesting to speak with RN about lab and echo results. Please call back to discuss

## 2016-04-21 LAB — CUP PACEART REMOTE DEVICE CHECK
Implantable Lead Implant Date: 20080818
Implantable Lead Location: 753859
Implantable Lead Model: 4470
Implantable Lead Serial Number: 602985
Lead Channel Impedance Value: 519 Ohm
Lead Channel Impedance Value: 67 Ohm
Lead Channel Pacing Threshold Amplitude: 1.125 V
Lead Channel Pacing Threshold Pulse Width: 0.4 ms
Lead Channel Sensing Intrinsic Amplitude: 8 mV
MDC IDC LEAD IMPLANT DT: 20080818
MDC IDC LEAD LOCATION: 753860
MDC IDC LEAD SERIAL: 499409
MDC IDC MSMT BATTERY IMPEDANCE: 202 Ohm
MDC IDC MSMT BATTERY REMAINING LONGEVITY: 129 mo
MDC IDC MSMT BATTERY VOLTAGE: 2.79 V
MDC IDC SESS DTM: 20170816123436
MDC IDC SET LEADCHNL RV PACING AMPLITUDE: 2.5 V
MDC IDC SET LEADCHNL RV PACING PULSEWIDTH: 0.4 ms
MDC IDC SET LEADCHNL RV SENSING SENSITIVITY: 4 mV
MDC IDC STAT BRADY RV PERCENT PACED: 15 %

## 2016-05-01 DIAGNOSIS — M545 Low back pain: Secondary | ICD-10-CM | POA: Diagnosis not present

## 2016-05-01 DIAGNOSIS — R7309 Other abnormal glucose: Secondary | ICD-10-CM | POA: Diagnosis not present

## 2016-05-10 ENCOUNTER — Other Ambulatory Visit: Payer: Self-pay | Admitting: Interventional Cardiology

## 2016-05-12 DIAGNOSIS — Z888 Allergy status to other drugs, medicaments and biological substances status: Secondary | ICD-10-CM | POA: Diagnosis not present

## 2016-05-12 DIAGNOSIS — Z9842 Cataract extraction status, left eye: Secondary | ICD-10-CM | POA: Diagnosis not present

## 2016-05-12 DIAGNOSIS — H02839 Dermatochalasis of unspecified eye, unspecified eyelid: Secondary | ICD-10-CM | POA: Diagnosis not present

## 2016-05-12 DIAGNOSIS — H01009 Unspecified blepharitis unspecified eye, unspecified eyelid: Secondary | ICD-10-CM | POA: Diagnosis not present

## 2016-05-12 DIAGNOSIS — H43813 Vitreous degeneration, bilateral: Secondary | ICD-10-CM | POA: Diagnosis not present

## 2016-05-12 DIAGNOSIS — Z9841 Cataract extraction status, right eye: Secondary | ICD-10-CM | POA: Diagnosis not present

## 2016-05-12 DIAGNOSIS — Z961 Presence of intraocular lens: Secondary | ICD-10-CM | POA: Diagnosis not present

## 2016-05-12 DIAGNOSIS — H02836 Dermatochalasis of left eye, unspecified eyelid: Secondary | ICD-10-CM | POA: Diagnosis not present

## 2016-05-12 DIAGNOSIS — H524 Presbyopia: Secondary | ICD-10-CM | POA: Diagnosis not present

## 2016-05-12 DIAGNOSIS — H40003 Preglaucoma, unspecified, bilateral: Secondary | ICD-10-CM | POA: Diagnosis not present

## 2016-05-12 DIAGNOSIS — H35363 Drusen (degenerative) of macula, bilateral: Secondary | ICD-10-CM | POA: Diagnosis not present

## 2016-05-12 DIAGNOSIS — H5213 Myopia, bilateral: Secondary | ICD-10-CM | POA: Diagnosis not present

## 2016-05-12 DIAGNOSIS — H52209 Unspecified astigmatism, unspecified eye: Secondary | ICD-10-CM | POA: Diagnosis not present

## 2016-05-12 DIAGNOSIS — Z885 Allergy status to narcotic agent status: Secondary | ICD-10-CM | POA: Diagnosis not present

## 2016-05-12 DIAGNOSIS — H5210 Myopia, unspecified eye: Secondary | ICD-10-CM | POA: Diagnosis not present

## 2016-05-12 DIAGNOSIS — H353132 Nonexudative age-related macular degeneration, bilateral, intermediate dry stage: Secondary | ICD-10-CM | POA: Diagnosis not present

## 2016-05-12 DIAGNOSIS — Z79899 Other long term (current) drug therapy: Secondary | ICD-10-CM | POA: Diagnosis not present

## 2016-05-12 DIAGNOSIS — H52203 Unspecified astigmatism, bilateral: Secondary | ICD-10-CM | POA: Diagnosis not present

## 2016-05-12 DIAGNOSIS — H40053 Ocular hypertension, bilateral: Secondary | ICD-10-CM | POA: Diagnosis not present

## 2016-05-12 DIAGNOSIS — H0289 Other specified disorders of eyelid: Secondary | ICD-10-CM | POA: Diagnosis not present

## 2016-05-12 DIAGNOSIS — H02833 Dermatochalasis of right eye, unspecified eyelid: Secondary | ICD-10-CM | POA: Diagnosis not present

## 2016-05-21 ENCOUNTER — Ambulatory Visit (INDEPENDENT_AMBULATORY_CARE_PROVIDER_SITE_OTHER): Payer: Medicare Other | Admitting: *Deleted

## 2016-05-21 DIAGNOSIS — I482 Chronic atrial fibrillation, unspecified: Secondary | ICD-10-CM

## 2016-05-21 DIAGNOSIS — Z5181 Encounter for therapeutic drug level monitoring: Secondary | ICD-10-CM

## 2016-05-21 DIAGNOSIS — I4891 Unspecified atrial fibrillation: Secondary | ICD-10-CM | POA: Diagnosis not present

## 2016-05-21 LAB — POCT INR: INR: 2.3

## 2016-05-30 ENCOUNTER — Other Ambulatory Visit: Payer: Self-pay | Admitting: Internal Medicine

## 2016-06-04 ENCOUNTER — Encounter: Payer: Self-pay | Admitting: Interventional Cardiology

## 2016-06-04 ENCOUNTER — Ambulatory Visit (INDEPENDENT_AMBULATORY_CARE_PROVIDER_SITE_OTHER): Payer: Medicare Other | Admitting: Interventional Cardiology

## 2016-06-04 VITALS — BP 138/62 | HR 62 | Ht 68.0 in | Wt 161.2 lb

## 2016-06-04 DIAGNOSIS — Z950 Presence of cardiac pacemaker: Secondary | ICD-10-CM

## 2016-06-04 DIAGNOSIS — I209 Angina pectoris, unspecified: Secondary | ICD-10-CM

## 2016-06-04 DIAGNOSIS — I119 Hypertensive heart disease without heart failure: Secondary | ICD-10-CM

## 2016-06-04 DIAGNOSIS — I25708 Atherosclerosis of coronary artery bypass graft(s), unspecified, with other forms of angina pectoris: Secondary | ICD-10-CM

## 2016-06-04 DIAGNOSIS — Z7901 Long term (current) use of anticoagulants: Secondary | ICD-10-CM

## 2016-06-04 DIAGNOSIS — Z9889 Other specified postprocedural states: Secondary | ICD-10-CM

## 2016-06-04 DIAGNOSIS — I5022 Chronic systolic (congestive) heart failure: Secondary | ICD-10-CM | POA: Diagnosis not present

## 2016-06-04 DIAGNOSIS — G4739 Other sleep apnea: Secondary | ICD-10-CM

## 2016-06-04 DIAGNOSIS — Z8679 Personal history of other diseases of the circulatory system: Secondary | ICD-10-CM

## 2016-06-04 DIAGNOSIS — I482 Chronic atrial fibrillation, unspecified: Secondary | ICD-10-CM

## 2016-06-04 NOTE — Patient Instructions (Signed)
Medication Instructions:  None  Labwork: BMET on same day that you follow up with PA or NP.  Testing/Procedures: None  Follow-Up: Your physician recommends that you schedule a follow-up appointment in: 6 months with a PA or NP.  (Needs BMET drawn same day as appt)   Your physician wants you to follow-up in: 1 year with Dr. [name]. You will receive a reminder letter in the mail two months in advance. If you don't receive a letter, please call our office to schedule the follow-up appointment.   Any Other Special Instructions Will Be Listed Below (If Applicable).     If you need a refill on your cardiac medications before your next appointment, please call your pharmacy.

## 2016-06-04 NOTE — Progress Notes (Signed)
Cardiology Office Note    Date:  06/04/2016   ID:  Adam [name], DOB April 05, 1925, [name] [id number]  PCP:  Melinda Crutch, MD  Cardiologist: Sinclair Grooms, MD   Chief Complaint  Patient presents with  . Atrial Fibrillation    History of Present Illness:  Adam [name] is a 82 y.o. male cardiomyopathy, chronic systolic heart failure, chronic atrial fibrillation, CAD with bypass graft failure (saphenous vein graft to the obtuse marginal), hypertension, chronic anticoagulation therapy, hypertension, and hyperlipidemia.   Stationary bicycles. No chest Pain or fainting. No change since last seen in August. At that time an echocardiogram documented EF of 35% which was stable compared with historically values. He still goes stationary biking and does not notice any significant change between now and 2 months ago.   Past Medical History:  Diagnosis Date  . AAA (abdominal aortic aneurysm) ([place])   . Anginal pain ([place])   . Arthritis   . CAD (coronary artery disease)    Adam [name] IS CARDIO  . CHF (congestive heart failure) ([place])   . Chronic anticoagulation   . Diverticulosis   . GERD (gastroesophageal reflux disease)   . Hyperlipidemia   . Hypertension   . LBP (low back pain)   . Permanent atrial fibrillation ([place])    decision [facility] at [facility]  . Presence of permanent cardiac pacemaker   . S/P CABG (coronary artery bypass graft)   . Shortness of breath dyspnea   . Sleep apnea     Past Surgical History:  Procedure Laterality Date  . CARDIOVERSION  06/30/2012   Procedure: CARDIOVERSION;  Surgeon: Sinclair Grooms, MD;  [facility] ENDOSCOPY;  Service: Cardiovascular;  Laterality: N/A;  . CARDIOVERSION N/A 11/23/2013   Procedure: CARDIOVERSION;  Surgeon: Sinclair Grooms, MD;  Location: [place];  Service: Cardiovascular;  Laterality: N/A;  . CATARACT EXTRACTION W/ INTRAOCULAR LENS  IMPLANT, BILATERAL    . CORONARY ARTERY BYPASS GRAFT  Feb 1989   "CABG X6"  . ENDOVASCULAR  STENT INSERTION  07/28/2011   Procedure: ENDOVASCULAR STENT GRAFT INSERTION;  Surgeon: Hinda Lenis, MD;  Location: [place];  Service: Vascular;  Laterality: N/A;  Pt. on Coumadin/ instructed to hold 5 days prior to procedure  . EYE SURGERY    . INGUINAL HERNIA REPAIR Right 01/04/2015  . INGUINAL HERNIA REPAIR Right 01/04/2015   Procedure:  OPEN REPAIR RIGHT INGUNIAL HERNIA;  Surgeon: Fanny Skates, MD;  Location: [place];  Service: General;  Laterality: Right;  . INSERT / REPLACE / REMOVE PACEMAKER  July 2008  . INSERTION OF MESH Right 01/04/2015   Procedure: INSERTION OF MESH;  Surgeon: Fanny Skates, MD;  Location: [place];  Service: General;  Laterality: Right;  . JOINT REPLACEMENT  1995 & 1996   Bilateral total knee replacements  . LEFT HEART CATHETERIZATION WITH CORONARY ANGIOGRAM N/A 03/28/2014   Procedure: LEFT HEART CATHETERIZATION WITH CORONARY ANGIOGRAM;  Surgeon: Sinclair Grooms, MD;  Location: [facility] CATH LAB;  Service: Cardiovascular;  Laterality: N/A;  . PERMANENT PACEMAKER GENERATOR CHANGE N/A 11/03/2013   Procedure: PERMANENT PACEMAKER GENERATOR CHANGE;  Surgeon: [name], MD;  Location: [facility] CATH LAB;  Service: Cardiovascular;  Laterality: N/A;  . TONSILLECTOMY      Current Medications: Outpatient Medications Prior to Visit  Medication Sig Dispense Refill  . acetaminophen (TYLENOL) 650 MG CR tablet Take 650 mg by mouth every 8 (eight) hours as needed for pain.     [name]  atorvastatin (LIPITOR) 10 MG tablet Take 1 tablet (10 mg total) by mouth daily. 90 tablet 2  . Cholecalciferol (VITAMIN D) 2000 UNITS CAPS Take 2,000 Units by mouth daily.    . furosemide (LASIX) 40 MG tablet TAKE 1.5 TABLETS (60 MG TOTAL) BY MOUTH DAILY. 135 tablet 2  . latanoprost (XALATAN) 0.005 % ophthalmic solution Place 1 drop into both eyes at bedtime.    [name] lisinopril (PRINIVIL,ZESTRIL) 2.5 MG tablet Take 2.5 mg by mouth daily.    . metoprolol tartrate (LOPRESSOR) 25 MG tablet TAKE ONE TABLET BY MOUTH  TWICE A DAY WITH FOOD 180 tablet 3  . Multiple Vitamins-Minerals (RA VISION-VITE PRESERVE PO) Take 1 tablet by mouth daily.     . nitroGLYCERIN (NITROSTAT) 0.4 MG SL tablet Place 0.4 mg under the tongue every 5 (five) minutes as needed. For chest pain    . Omega-3 Fatty Acids (FISH OIL) 1000 MG CPDR Take 1 capsule by mouth daily.     . silodosin (RAPAFLO) 8 MG CAPS capsule Take 8 mg by mouth daily at 6 PM.     . warfarin (COUMADIN) 5 MG tablet TAKE AS DIRECTED BY ANTICOAGULATION CLINIC 90 tablet 0   No facility-administered medications prior to visit.      Allergies:   Morphine and Amiodarone   Social History   Social History  . Marital status: Married    Spouse name: N/A  . Number of children: N/A  . Years of education: N/A   Social History Main Topics  . Smoking status: Former Smoker    Years: 3.00    Types: Cigarettes    Quit date: 08/17/1948  . Smokeless tobacco: Never Used  . Alcohol use No  . Drug use: No  . Sexual activity: Not Asked   Other Topics Concern  . None   Social History Narrative  . None     Family History:  The patient's family history includes Cancer in his sister; Heart attack in his father; Heart disease in his father and sister; Heart failure in his mother; Other in his sister; Stroke in his mother; Varicose Veins in his mother.   ROS:   Please see the history of present illness.    His back hurts. Decreased vision. Decreased hearing. Occasional cough. Dizziness and easy bruising. Some difficulty with urination. Difficulty with balance.  All other systems reviewed and are negative.   PHYSICAL EXAM:   VS:  BP 138/62   Pulse 62   Ht 5\' 8"  (1.727 m)   Wt 161 lb 3.2 oz (73.1 kg)   SpO2 97%   BMI 24.51 kg/m    GEN: Well nourished, well developed, in no acute distress  HEENT: normal  Neck: no JVD, carotid bruits, or masses Cardiac: RRR; no murmurs, rubs, or gallops,no edema  Respiratory:  clear to auscultation bilaterally, normal work of  breathing GI: soft, nontender, nondistended, + BS MS: no deformity or atrophy  Skin: warm and dry, no rash Neuro:  Alert and Oriented x 3, Strength and sensation are intact Psych: euthymic mood, full affect  Wt Readings from Last 3 Encounters:  06/04/16 161 lb 3.2 oz (73.1 kg)  04/07/16 160 lb 6.4 oz (72.8 kg)  12/13/15 160 lb 6.4 oz (72.8 kg)      Studies/Labs Reviewed:   EKG:  EKG    Recent Labs: 04/07/2016: ALT 15; Brain Natriuretic Peptide 162.8; BUN 25; Creat 1.07; Potassium 4.5; Sodium 142   Lipid Panel No results found for: CHOL, TRIG, HDL,  CHOLHDL, VLDL, LDLCALC, LDLDIRECT  Additional studies/ records that were reviewed today include:  Echocardiogram done 04/09/16: Personally reviewed and discussed with the patient. Study Conclusions  - Left ventricle: The cavity size was normal. Wall thickness was   normal. Indeterminant diastolic function (atrial fibrillation).   The estimated ejection fraction was 35%. Diffuse hypokinesis. - Aortic valve: There was no stenosis. There was trivial   regurgitation. - Mitral valve: Mildly calcified annulus. There was mild   regurgitation. - Left atrium: The atrium was moderately dilated. - Right ventricle: The cavity size was normal. Systolic function   was mildly reduced. - Right atrium: The atrium was moderately dilated. - Tricuspid valve: There was severe regurgitation. There was   systolic flow reversal in the hepatic vein doppler signal. Peak   RV-RA gradient (S): 50 mm Hg. - Pulmonary arteries: PA peak pressure: 65 mm Hg (S). - Systemic veins: IVC measured 2.5 cm with < 50% respirophasic   variation, suggesting RA pressure 15 mmHg.  Impressions:  - The patient was in atrial fibrillation. Normal LV size with   diffuse hypokinesis, EF 35%. The RV was normal in size with   mildly decreased systolic function. Severe tricuspid   regurgitation. Moderate pulmonary hypertension.   ASSESSMENT:    1. Atherosclerosis of  coronary artery bypass graft of native heart with other forms of angina pectoris ([place])   2. HYPERTENSION, HEART CONTROLLED W/O ASSOC CHF   3. Chronic systolic heart failure ([place])   4. Chronic atrial fibrillation (HCC)   5. S/P AAA (abdominal aortic aneurysm) repair   6. PACEMAKER, PERMANENT-[employer]onic   7. Long term current use of anticoagulant therapy   8. Other sleep apnea      PLAN:  In order of problems listed above:  1. Doing well without angina. Use nitroglycerin as required. 2. Blood pressure is well controlled. 3. No evidence of volume overload on current medical regimen. Stable compared to August. 4. Heart rate is under excellent control. 5. Not addressed 6. Normal pacemaker function 7. No bleeding on anticoagulation 8. He is compliant with CP  Six-month follow-up with APP one-year follow-up with me unless acute changes.  Medication Adjustments/Labs and Tests Ordered: Current medicines are reviewed at length with the patient today.  Concerns regarding medicines are outlined above.  Medication changes, Labs and Tests ordered today are listed in the Patient Instructions below. There are no Patient Instructions on file for this visit.   Signed, Sinclair Grooms, MD  06/04/2016 2:37 PM    [facility] [address], [place], [place]  [address] Phone: [phone]; Fax: [phone]

## 2016-06-05 DIAGNOSIS — Z23 Encounter for immunization: Secondary | ICD-10-CM | POA: Diagnosis not present

## 2016-06-18 ENCOUNTER — Other Ambulatory Visit: Payer: Self-pay | Admitting: Interventional Cardiology

## 2016-07-02 ENCOUNTER — Ambulatory Visit (INDEPENDENT_AMBULATORY_CARE_PROVIDER_SITE_OTHER): Payer: Medicare Other | Admitting: Internal Medicine

## 2016-07-02 ENCOUNTER — Encounter: Payer: Self-pay | Admitting: Internal Medicine

## 2016-07-02 ENCOUNTER — Ambulatory Visit (INDEPENDENT_AMBULATORY_CARE_PROVIDER_SITE_OTHER): Payer: Medicare Other

## 2016-07-02 ENCOUNTER — Encounter (INDEPENDENT_AMBULATORY_CARE_PROVIDER_SITE_OTHER): Payer: Self-pay

## 2016-07-02 VITALS — BP 120/60 | HR 71 | Ht 68.0 in | Wt 161.8 lb

## 2016-07-02 DIAGNOSIS — Z950 Presence of cardiac pacemaker: Secondary | ICD-10-CM | POA: Diagnosis not present

## 2016-07-02 DIAGNOSIS — I482 Chronic atrial fibrillation, unspecified: Secondary | ICD-10-CM

## 2016-07-02 DIAGNOSIS — R001 Bradycardia, unspecified: Secondary | ICD-10-CM

## 2016-07-02 DIAGNOSIS — I4891 Unspecified atrial fibrillation: Secondary | ICD-10-CM | POA: Diagnosis not present

## 2016-07-02 DIAGNOSIS — I4821 Permanent atrial fibrillation: Secondary | ICD-10-CM

## 2016-07-02 DIAGNOSIS — Z5181 Encounter for therapeutic drug level monitoring: Secondary | ICD-10-CM

## 2016-07-02 DIAGNOSIS — I255 Ischemic cardiomyopathy: Secondary | ICD-10-CM | POA: Diagnosis not present

## 2016-07-02 DIAGNOSIS — I2589 Other forms of chronic ischemic heart disease: Secondary | ICD-10-CM

## 2016-07-02 LAB — CUP PACEART INCLINIC DEVICE CHECK
Battery Impedance: 202 Ohm
Brady Statistic RV Percent Paced: 15 %
Implantable Lead Location: 753859
Implantable Lead Model: 4470
Implantable Lead Serial Number: 499409
Lead Channel Impedance Value: 474 Ohm
Lead Channel Impedance Value: 67 Ohm
Lead Channel Sensing Intrinsic Amplitude: 11.2 mV
Lead Channel Setting Pacing Pulse Width: 0.4 ms
MDC IDC LEAD IMPLANT DT: 20080818
MDC IDC LEAD IMPLANT DT: 20080818
MDC IDC LEAD LOCATION: 753860
MDC IDC LEAD SERIAL: 602985
MDC IDC MSMT BATTERY REMAINING LONGEVITY: 128 mo
MDC IDC MSMT BATTERY VOLTAGE: 2.79 V
MDC IDC MSMT LEADCHNL RV PACING THRESHOLD AMPLITUDE: 1.25 V
MDC IDC MSMT LEADCHNL RV PACING THRESHOLD PULSEWIDTH: 0.4 ms
MDC IDC PG IMPLANT DT: 20150320
MDC IDC SESS DTM: 20171116163230
MDC IDC SET LEADCHNL RV PACING AMPLITUDE: 2.5 V
MDC IDC SET LEADCHNL RV SENSING SENSITIVITY: 5.6 mV

## 2016-07-02 LAB — POCT INR: INR: 2.4

## 2016-07-02 NOTE — Patient Instructions (Addendum)
Medication Instructions: - Your physician has recommended you make the following change in your medication:  1) Take 100 mg of your lasix every other day alternating with 60 mg by mouth daily for 5 days --Friday 07/03/16 - 100 mg --Saturday 07/04/16 - 60 mg --Sunday 07/05/16 - 100 mg --Monday 07/06/16 - 60 mg --Tuesday 07/07/16 - 100 mg  Labwork: - none ordered  Procedures/Testing: - none ordered  Follow-Up: - Remote monitoring is used to monitor your Pacemaker from home. This monitoring reduces the number of office visits required to check your device to one time per year. It allows [place] to keep an eye on the functioning of your device to ensure it is working properly. You are scheduled for a device check from home on 10/01/16 . You may send your transmission at any time that day. If you have a wireless device, the transmission will be sent automatically. After your physician reviews your transmission, you will receive a postcard with your next transmission date.  - Your physician wants you to follow-up in: March 2019. You will receive a reminder letter in the mail two months in advance. If you don't receive a letter, please call our office to schedule the follow-up appointment.  Any Additional Special Instructions Will Be Listed Below (If Applicable).     If you need a refill on your cardiac medications before your next appointment, please call your pharmacy.

## 2016-07-02 NOTE — Progress Notes (Signed)
Patient Care Team: [name], MD as PCP - General (Family Medicine) [name], MD (Cardiology) [name], MD (Cardiology) [name], MD as Consulting Physician (Cardiology)   HPI  Adam [name] is a 82 y.o. 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  Recurrent atrial fibrillation in late March and underwent repeat cardioversion. He has been seen by Dr. [name] at [facility]. Most recently he was seen October 2015 at which time because of persistent and now permanent atrial fibrillation a discussion was had regarding CRT upgrade.  He has had some peripheral edema.  It is his impression that the edema is worse. It is also associated with some discomfort. Review of the old records however supports his contention that his weight is unchanged.  Echocardiogram [facility] 2015 demonstrated severe LV dysfunction with EF of 25%.  Repeat echo 8/17 EF 35%.  Dr. Thompson Caul evaluation 9/17 suggested functional status was stable.   He notes over recent weeks the onset of night sweats; he has not noticed a change in appetite. There are no new medications apart from something that he is taking for IBS  Past Medical History:  Diagnosis Date  . AAA (abdominal aortic aneurysm) ([place])   . Anginal pain ([place])   . Arthritis   . CAD (coronary artery disease)    [name]  . CHF (congestive heart failure) ([place])   . Chronic anticoagulation   . Diverticulosis   . GERD (gastroesophageal reflux disease)   . Hyperlipidemia   . Hypertension   . LBP (low back pain)   . Permanent atrial fibrillation ([place])    decision made at [facility]  . Presence of permanent cardiac pacemaker   . S/P CABG (coronary artery bypass graft)   . Shortness of breath  dyspnea   . Sleep apnea     Past Surgical History:  Procedure Laterality Date  . CARDIOVERSION  06/30/2012   Procedure: CARDIOVERSION;  Surgeon: Sinclair Grooms, MD;  Location: [facility] ENDOSCOPY;  Service: Cardiovascular;  Laterality: N/A;  . CARDIOVERSION N/A 11/23/2013   Procedure: CARDIOVERSION;  Surgeon: Sinclair Grooms, MD;  Location: [place];  Service: Cardiovascular;  Laterality: N/A;  . CATARACT EXTRACTION W/ INTRAOCULAR LENS  IMPLANT, BILATERAL    . CORONARY ARTERY BYPASS GRAFT  Feb 1989   "CABG X6"  . ENDOVASCULAR STENT INSERTION  07/28/2011   Procedure: ENDOVASCULAR STENT GRAFT INSERTION;  Surgeon: Hinda Lenis, MD;  Location: [place];  Service: Vascular;  Laterality: N/A;  Pt. on Coumadin/ instructed to hold 5 days prior to procedure  . EYE SURGERY    . INGUINAL HERNIA REPAIR Right 01/04/2015  . INGUINAL HERNIA REPAIR Right 01/04/2015   Procedure:  OPEN REPAIR RIGHT INGUNIAL HERNIA;  Surgeon: Fanny Skates, MD;  Location: [place];  Service: General;  Laterality: Right;  . INSERT / REPLACE / REMOVE PACEMAKER  July 2008  . INSERTION OF MESH Right 01/04/2015   Procedure: INSERTION OF MESH;  Surgeon: Fanny Skates, MD;  Location: [place];  Service: General;  Laterality: Right;  . JOINT REPLACEMENT  1995 & 1996   Bilateral total knee replacements  . LEFT HEART CATHETERIZATION WITH CORONARY ANGIOGRAM N/A 03/28/2014   Procedure: LEFT HEART  CATHETERIZATION WITH CORONARY ANGIOGRAM;  Surgeon: Sinclair Grooms, MD;  Location: [facility] CATH LAB;  Service: Cardiovascular;  Laterality: N/A;  . PERMANENT PACEMAKER GENERATOR CHANGE N/A 11/03/2013   Procedure: PERMANENT PACEMAKER GENERATOR CHANGE;  Surgeon: [name], MD;  Location: [facility] CATH LAB;  Service: Cardiovascular;  Laterality: N/A;  . TONSILLECTOMY      Current Outpatient Prescriptions  Medication Sig Dispense Refill  . acetaminophen (TYLENOL) 650 MG CR tablet Take 650 mg by mouth every 8 (eight) hours as needed for pain.     [name]  atorvastatin (LIPITOR) 10 MG tablet Take 1 tablet (10 mg total) by mouth daily. 90 tablet 2  . Cholecalciferol (VITAMIN D) 2000 UNITS CAPS Take 2,000 Units by mouth daily.    . furosemide (LASIX) 40 MG tablet TAKE 1.5 TABLETS (60 MG TOTAL) BY MOUTH DAILY. 135 tablet 3  . latanoprost (XALATAN) 0.005 % ophthalmic solution Place 1 drop into both eyes at bedtime.    [name] lisinopril (PRINIVIL,ZESTRIL) 2.5 MG tablet Take 2.5 mg by mouth daily.    . metoprolol tartrate (LOPRESSOR) 25 MG tablet TAKE ONE TABLET BY MOUTH TWICE A DAY WITH FOOD 180 tablet 3  . Multiple Vitamins-Minerals (RA VISION-VITE PRESERVE PO) Take 1 tablet by mouth daily.     . nitroGLYCERIN (NITROSTAT) 0.4 MG SL tablet Place 0.4 mg under the tongue every 5 (five) minutes as needed. For chest pain    . Omega-3 Fatty Acids (FISH OIL) 1000 MG CPDR Take 1 capsule by mouth daily.     . polyethylene glycol (MIRALAX / GLYCOLAX) packet Take 17 g by mouth daily as needed (ibs).    . silodosin (RAPAFLO) 8 MG CAPS capsule Take 8 mg by mouth daily at 6 PM.     . warfarin (COUMADIN) 5 MG tablet TAKE AS DIRECTED BY ANTICOAGULATION CLINIC 90 tablet 0   No current facility-administered medications for this visit.     Allergies  Allergen Reactions  . Morphine Nausea Only  . Amiodarone Anxiety and Rash    Review of Systems negative except from HPI and PMH  Physical Exam BP 120/60   Pulse 71   Ht 5\' 8"  (1.727 m)   Wt 161 lb 12.8 oz (73.4 kg)   SpO2 93%   BMI 24.60 kg/m  Well developed and well nourished in no acute distress HENT normal E scleral and icterus clear Neck Supple JVP 6-7; carotids brisk and full Clear to ausculation Device pocket well healed; without hematoma or erythema.  There is no tethering  Regular rate and rhythm, no murmurs gallops or rub Soft with active bowel sounds No clubbing cyanosis 2+ Edema He has a intradermal hematoma over his left shin. There is no surrounding erythema. There is no oozing Alert and  oriented, grossly normal motor and sensory function Skin Warm and Dry  ECG Afib  V pacing    Assessment and  Plan  bradycardia  Atrial fibrillation-permanent  Ischemic cardiomyopathy  HFrEF   Pacemaker-Medtronic  Night Sweats     With his increasing discomfort and his edema we will increase his diuretic from 60--100/60 alternating for 5 days.  I have suggested that he follow-up with his PCP if his night sweats do not abate.  On Anticoagulation;  No bleeding issues   Without symptoms of ischemia  His ventricular pacing percentage is only 15%. It is unlikely that it is contributing to his cardiomyopathy. We have been able to minimize ventricular pacing by keeping his lower rate at 40.

## 2016-08-04 ENCOUNTER — Other Ambulatory Visit: Payer: Self-pay | Admitting: Interventional Cardiology

## 2016-08-13 ENCOUNTER — Ambulatory Visit (INDEPENDENT_AMBULATORY_CARE_PROVIDER_SITE_OTHER): Payer: Medicare Other

## 2016-08-13 DIAGNOSIS — I255 Ischemic cardiomyopathy: Secondary | ICD-10-CM | POA: Diagnosis not present

## 2016-08-13 DIAGNOSIS — I482 Chronic atrial fibrillation, unspecified: Secondary | ICD-10-CM

## 2016-08-13 DIAGNOSIS — I4891 Unspecified atrial fibrillation: Secondary | ICD-10-CM

## 2016-08-13 DIAGNOSIS — Z5181 Encounter for therapeutic drug level monitoring: Secondary | ICD-10-CM

## 2016-08-13 LAB — POCT INR: INR: 2

## 2016-09-23 ENCOUNTER — Ambulatory Visit (INDEPENDENT_AMBULATORY_CARE_PROVIDER_SITE_OTHER): Payer: Medicare Other | Admitting: *Deleted

## 2016-09-23 ENCOUNTER — Encounter (INDEPENDENT_AMBULATORY_CARE_PROVIDER_SITE_OTHER): Payer: Self-pay

## 2016-09-23 DIAGNOSIS — Z5181 Encounter for therapeutic drug level monitoring: Secondary | ICD-10-CM | POA: Diagnosis not present

## 2016-09-23 DIAGNOSIS — I4891 Unspecified atrial fibrillation: Secondary | ICD-10-CM

## 2016-09-23 DIAGNOSIS — I482 Chronic atrial fibrillation, unspecified: Secondary | ICD-10-CM

## 2016-09-23 LAB — POCT INR: INR: 2.3

## 2016-10-01 ENCOUNTER — Ambulatory Visit (INDEPENDENT_AMBULATORY_CARE_PROVIDER_SITE_OTHER): Payer: Medicare Other | Admitting: *Deleted

## 2016-10-01 ENCOUNTER — Telehealth: Payer: Self-pay | Admitting: Cardiology

## 2016-10-01 DIAGNOSIS — R001 Bradycardia, unspecified: Secondary | ICD-10-CM | POA: Diagnosis not present

## 2016-10-01 NOTE — Telephone Encounter (Signed)
LMOVM reminding pt to send remote transmission.   

## 2016-10-01 NOTE — Progress Notes (Signed)
Remote pacemaker transmission.   

## 2016-10-02 DIAGNOSIS — I4891 Unspecified atrial fibrillation: Secondary | ICD-10-CM | POA: Diagnosis not present

## 2016-10-02 DIAGNOSIS — N138 Other obstructive and reflux uropathy: Secondary | ICD-10-CM | POA: Diagnosis not present

## 2016-10-02 DIAGNOSIS — N398 Other specified disorders of urinary system: Secondary | ICD-10-CM | POA: Diagnosis not present

## 2016-10-02 DIAGNOSIS — N401 Enlarged prostate with lower urinary tract symptoms: Secondary | ICD-10-CM | POA: Diagnosis not present

## 2016-10-04 LAB — CUP PACEART REMOTE DEVICE CHECK
Battery Remaining Longevity: 122 mo
Battery Voltage: 2.78 V
Brady Statistic RV Percent Paced: 13 %
Implantable Lead Implant Date: 20080818
Implantable Lead Location: 753860
Implantable Lead Model: 4469
Implantable Lead Model: 4470
Implantable Lead Serial Number: 499409
Implantable Pulse Generator Implant Date: 20150320
Lead Channel Impedance Value: 469 Ohm
Lead Channel Pacing Threshold Amplitude: 1.125 V
Lead Channel Pacing Threshold Pulse Width: 0.4 ms
Lead Channel Setting Pacing Pulse Width: 0.4 ms
MDC IDC LEAD IMPLANT DT: 20080818
MDC IDC LEAD LOCATION: 753859
MDC IDC LEAD SERIAL: 602985
MDC IDC MSMT BATTERY IMPEDANCE: 252 Ohm
MDC IDC MSMT LEADCHNL RA IMPEDANCE VALUE: 67 Ohm
MDC IDC SESS DTM: 20180215192937
MDC IDC SET LEADCHNL RV PACING AMPLITUDE: 2.5 V
MDC IDC SET LEADCHNL RV SENSING SENSITIVITY: 4 mV

## 2016-10-06 ENCOUNTER — Encounter: Payer: Self-pay | Admitting: Cardiology

## 2016-11-02 ENCOUNTER — Other Ambulatory Visit: Payer: Self-pay | Admitting: *Deleted

## 2016-11-02 MED ORDER — WARFARIN SODIUM 5 MG PO TABS: ORAL_TABLET | ORAL | 0 refills | Status: DC

## 2016-11-04 ENCOUNTER — Ambulatory Visit (INDEPENDENT_AMBULATORY_CARE_PROVIDER_SITE_OTHER): Payer: Medicare Other | Admitting: *Deleted

## 2016-11-04 DIAGNOSIS — Z5181 Encounter for therapeutic drug level monitoring: Secondary | ICD-10-CM | POA: Diagnosis not present

## 2016-11-04 DIAGNOSIS — I482 Chronic atrial fibrillation, unspecified: Secondary | ICD-10-CM

## 2016-11-04 DIAGNOSIS — I4891 Unspecified atrial fibrillation: Secondary | ICD-10-CM | POA: Diagnosis not present

## 2016-11-04 LAB — POCT INR: INR: 2.4

## 2016-11-12 DIAGNOSIS — Z961 Presence of intraocular lens: Secondary | ICD-10-CM | POA: Diagnosis not present

## 2016-11-12 DIAGNOSIS — H353132 Nonexudative age-related macular degeneration, bilateral, intermediate dry stage: Secondary | ICD-10-CM | POA: Diagnosis not present

## 2016-11-12 DIAGNOSIS — H43813 Vitreous degeneration, bilateral: Secondary | ICD-10-CM | POA: Diagnosis not present

## 2016-11-12 DIAGNOSIS — H40053 Ocular hypertension, bilateral: Secondary | ICD-10-CM | POA: Diagnosis not present

## 2016-11-18 NOTE — Addendum Note (Signed)
Addended by: Lianne Cure A on: 11/18/2016 09:02 AM   Modules accepted: Orders

## 2016-11-19 ENCOUNTER — Telehealth: Payer: Self-pay | Admitting: Vascular Surgery

## 2016-11-19 NOTE — Telephone Encounter (Signed)
I scheduled an appt for the patient for a CTA Abd/Pelvis for 12/15/16 at 10:30am. He needs to arrive at 10am at the [address] [facility]. He is also scheduled to see Dr.Chen on 12/25/16 at 10:45am. I left a VM for the patient regarding these appts and also mailed a letter with the information. awt

## 2016-12-07 ENCOUNTER — Other Ambulatory Visit: Payer: Medicare Other

## 2016-12-07 ENCOUNTER — Ambulatory Visit (INDEPENDENT_AMBULATORY_CARE_PROVIDER_SITE_OTHER): Payer: Medicare Other | Admitting: Nurse Practitioner

## 2016-12-07 ENCOUNTER — Encounter: Payer: Self-pay | Admitting: Nurse Practitioner

## 2016-12-07 VITALS — BP 150/70 | HR 43 | Ht 68.0 in | Wt 161.0 lb

## 2016-12-07 DIAGNOSIS — I255 Ischemic cardiomyopathy: Secondary | ICD-10-CM | POA: Diagnosis not present

## 2016-12-07 DIAGNOSIS — I2589 Other forms of chronic ischemic heart disease: Secondary | ICD-10-CM | POA: Diagnosis not present

## 2016-12-07 DIAGNOSIS — I482 Chronic atrial fibrillation, unspecified: Secondary | ICD-10-CM

## 2016-12-07 DIAGNOSIS — Z950 Presence of cardiac pacemaker: Secondary | ICD-10-CM

## 2016-12-07 LAB — CBC WITH DIFFERENTIAL/PLATELET
Basophils Absolute: 0 10*3/uL (ref 0.0–0.2)
Basos: 0 %
EOS (ABSOLUTE): 0.1 10*3/uL (ref 0.0–0.4)
Eos: 1 %
Hematocrit: 37.9 % (ref 37.5–51.0)
Hemoglobin: 12.5 g/dL — ABNORMAL LOW (ref 13.0–17.7)
Immature Grans (Abs): 0.1 10*3/uL (ref 0.0–0.1)
Immature Granulocytes: 1 %
Lymphocytes Absolute: 1.8 10*3/uL (ref 0.7–3.1)
Lymphs: 19 %
MCH: 31.9 pg (ref 26.6–33.0)
MCHC: 33 g/dL (ref 31.5–35.7)
MCV: 97 fL (ref 79–97)
Monocytes Absolute: 1.1 10*3/uL — ABNORMAL HIGH (ref 0.1–0.9)
Monocytes: 12 %
Neutrophils Absolute: 6.2 10*3/uL (ref 1.4–7.0)
Neutrophils: 67 %
Platelets: 129 10*3/uL — ABNORMAL LOW (ref 150–379)
RBC: 3.92 x10E6/uL — ABNORMAL LOW (ref 4.14–5.80)
RDW: 14.3 % (ref 12.3–15.4)
WBC: 9.3 10*3/uL (ref 3.4–10.8)

## 2016-12-07 LAB — LIPID PANEL
Chol/HDL Ratio: 2.5 ratio (ref 0.0–5.0)
Cholesterol, Total: 141 mg/dL (ref 100–199)
HDL: 56 mg/dL (ref >39)
LDL Calculated: 73 mg/dL (ref 0–99)
Triglycerides: 59 mg/dL (ref 0–149)
VLDL Cholesterol Cal: 12 mg/dL (ref 5–40)

## 2016-12-07 LAB — HEPATIC FUNCTION PANEL
ALT: 15 IU/L (ref 0–44)
AST: 23 IU/L (ref 0–40)
Albumin: 4.5 g/dL (ref 3.5–4.7)
Alkaline Phosphatase: 99 IU/L (ref 39–117)
Bilirubin Total: 0.6 mg/dL (ref 0.0–1.2)
Bilirubin, Direct: 0.15 mg/dL (ref 0.00–0.40)
Total Protein: 7.2 g/dL (ref 6.0–8.5)

## 2016-12-07 LAB — BASIC METABOLIC PANEL
BUN/Creatinine Ratio: 21 (ref 10–24)
BUN: 23 mg/dL (ref 8–27)
CO2: 27 mmol/L (ref 18–29)
Calcium: 9.8 mg/dL (ref 8.6–10.2)
Chloride: 100 mmol/L (ref 96–106)
Creatinine, Ser: 1.1 mg/dL (ref 0.76–1.27)
GFR calc Af Amer: 68 mL/min/{1.73_m2} (ref >59)
GFR calc non Af Amer: 59 mL/min/{1.73_m2} — ABNORMAL LOW (ref >59)
Glucose: 104 mg/dL — ABNORMAL HIGH (ref 65–99)
Potassium: 5.6 mmol/L — ABNORMAL HIGH (ref 3.5–5.2)
Sodium: 143 mmol/L (ref 134–144)

## 2016-12-07 LAB — TSH: TSH: 5.14 u[IU]/mL — ABNORMAL HIGH (ref 0.450–4.500)

## 2016-12-07 NOTE — Patient Instructions (Addendum)
We will be checking the following labs today - BMET, CBC with diff, HPF, Lipids and TSH   Medication Instructions:    Continue with your current medicines.     Testing/Procedures To Be Arranged:  N/A  Follow-Up:   See me in 3 months with repeat EKG    Other Special Instructions:   Call me for any dizzy spells    If you need a refill on your cardiac medications before your next appointment, please call your pharmacy.   Call the [place] office at [phone] if you have any questions, problems or concerns.

## 2016-12-07 NOTE — Progress Notes (Addendum)
CARDIOLOGY OFFICE NOTE  Date:  12/07/2016    Adam [name] Date of Birth: 1925/04/03 Medical Record #[id number]  PCP:  Melinda Crutch, MD  Cardiologist:  [place]    Chief Complaint  Patient presents with  . Atrial Fibrillation  . Coronary Artery Disease  . Cardiomyopathy    6 month check - seen for Dr. [name]    History of Present Illness: Adam [name] is a 83 y.o. male who presents today for a follow up visit. Seen for Dr. Danella Maiers. He is a former patient of Dr. Susa Simmonds as well.   He has a known ischemic cardiomyopathy, chronic systolic heart failure, chronic atrial fibrillation, CAD with bypass graft failure (saphenous vein graft to the obtuse marginal), hypertension, chronic anticoagulation therapy, hypertension, and hyperlipidemia. EF is 35% per echo from 03/2016. He has a pacemaker in place.   Noted that his pacemaker is set at VVI 40.   Last seen by Dr. [name] back in October - noted functional status pretty good. Saw Dr. Caryl Comes in November - very little V pacing noted. Elected to continue with his current regimen.   Comes in today. Here alone. Says he is doing ok. Walking about 10 minutes on the treadmill and 10 minutes on his stationary bike = every day. Not dizzy. No passing out. Says his "energy is not all that bad". [facility] and fell in the shower - hurt his hip but this turned out ok. Coumadin ok. He has had some bruising - pretty significant on the upper thighs. He will have some intermittent shortness of breath. Due to see [facility] later in May with plans for CT scanning on the abdomen. Some swelling in his ankles noted. Overall, he feels like he is doing ok.   Past Medical History:  Diagnosis Date  . AAA (abdominal aortic aneurysm) ([place])   . Anginal pain ([place])   . Arthritis   . CAD (coronary artery disease)    [name]  . CHF (congestive heart failure) ([place])   . Chronic anticoagulation   .  Diverticulosis   . GERD (gastroesophageal reflux disease)   . Hyperlipidemia   . Hypertension   . LBP (low back pain)   . Permanent atrial fibrillation ([place])    decision made at [facility]  . Presence of permanent cardiac pacemaker   . S/P CABG (coronary artery bypass graft)   . Shortness of breath dyspnea   . Sleep apnea     Past Surgical History:  Procedure Laterality Date  . CARDIOVERSION  06/30/2012   Procedure: CARDIOVERSION;  Surgeon: Sinclair Grooms, MD;  [facility]COPY;  Service: Cardiovascular;  Laterality: N/A;  . CARDIOVERSION N/A 11/23/2013   Procedure: CARDIOVERSION;  Surgeon: Sinclair Grooms, MD;  Location: [facility];  Service: Cardiovascular;  Laterality: N/A;  . CATARACT EXTRACTION W/ INTRAOCULAR LENS  IMPLANT, BILATERAL    . CORONARY ARTERY BYPASS GRAFT  Feb 1989   "CABG X6"  . ENDOVASCULAR STENT INSERTION  07/28/2011   Procedure: ENDOVASCULAR STENT GRAFT INSERTION;  Surgeon: Hinda Lenis, MD;  Location: [place];  Service: Vascular;  Laterality: N/A;  Pt. on Coumadin/ instructed to hold 5 days prior to procedure  . EYE SURGERY    . INGUINAL HERNIA REPAIR Right 01/04/2015  . INGUINAL HERNIA REPAIR Right 01/04/2015   Procedure:  OPEN REPAIR RIGHT INGUNIAL HERNIA;  Surgeon:  Fanny Skates, MD;  Location: [place];  Service: General;  Laterality: Right;  . INSERT / REPLACE / REMOVE PACEMAKER  July 2008  . INSERTION OF MESH Right 01/04/2015   Procedure: INSERTION OF MESH;  Surgeon: Fanny Skates, MD;  Location: [place];  Service: General;  Laterality: Right;  . JOINT REPLACEMENT  1995 & 1996   Bilateral total knee replacements  . LEFT HEART CATHETERIZATION WITH CORONARY ANGIOGRAM N/A 03/28/2014   Procedure: LEFT HEART CATHETERIZATION WITH CORONARY ANGIOGRAM;  Surgeon: Sinclair Grooms, MD;  Location: [facility] CATH LAB;  Service: Cardiovascular;  Laterality: N/A;  . PERMANENT PACEMAKER GENERATOR CHANGE N/A 11/03/2013   Procedure: PERMANENT PACEMAKER GENERATOR CHANGE;   Surgeon: [name], MD;  Location: [facility] C[facility] LAB;  Service: Cardiovascular;  Laterality: N/A;  . TONSILLECTOMY       Medications: Current Outpatient Prescriptions  Medication Sig Dispense Refill  . acetaminophen (TYLENOL) 650 MG CR tablet Take 650 mg by mouth every 8 (eight) hours as needed for pain.     [name] atorvastatin (LIPITOR) 10 MG tablet Take 1 tablet (10 mg total) by mouth daily. 90 tablet 2  . Cholecalciferol (VITAMIN D) 2000 UNITS CAPS Take 2,000 Units by mouth daily.    . furosemide (LASIX) 40 MG tablet TAKE 1.5 TABLETS (60 MG TOTAL) BY MOUTH DAILY. 135 tablet 3  . latanoprost (XALATAN) 0.005 % ophthalmic solution Place 1 drop into both eyes at bedtime.    [name] lisinopril (PRINIVIL,ZESTRIL) 2.5 MG tablet Take 2.5 mg by mouth daily.    . metoprolol tartrate (LOPRESSOR) 25 MG tablet TAKE ONE TABLET BY MOUTH TWICE A DAY WITH FOOD 180 tablet 3  . Multiple Vitamins-Minerals (RA VISION-VITE PRESERVE PO) Take 1 tablet by mouth daily.     . nitroGLYCERIN (NITROSTAT) 0.4 MG SL tablet Place 0.4 mg under the tongue every 5 (five) minutes as needed. For chest pain    . Omega-3 Fatty Acids (FISH OIL) 1000 MG CPDR Take 1 capsule by mouth daily.     . polyethylene glycol (MIRALAX / GLYCOLAX) packet Take 17 g by mouth daily as needed (ibs).    . silodosin (RAPAFLO) 8 MG CAPS capsule Take 8 mg by mouth daily at 6 PM.     . warfarin (COUMADIN) 5 MG tablet TAKE AS DIRECTED PER CLINIC INSTRUCTIONS 90 tablet 0   No current facility-administered medications for this visit.     Allergies: Allergies  Allergen Reactions  . Morphine Nausea Only  . Amiodarone Anxiety and Rash    Social History: The patient  reports that he quit smoking about 68 years ago. His smoking use included Cigarettes. He quit after 3.00 years of use. He has never used smokeless tobacco. He reports that he does not drink alcohol or use drugs.   Family History: The patient's family history includes Cancer in his sister; Heart  attack in his father; Heart disease in his father and sister; Heart failure in his mother; Other in his sister; Stroke in his mother; Varicose Veins in his mother.   Review of Systems: Please see the history of present illness.   Otherwise, the review of systems is positive for none.   All other systems are reviewed and negative.   Physical Exam: VS:  BP (!) 150/70   Pulse (!) 43   Ht 5\' 8"  (1.727 m)   Wt 161 lb (73 kg)   BMI 24.48 kg/m  .  BMI Body mass index is 24.48 kg/m.  Wt Readings from  Last 3 Encounters:  12/07/16 161 lb (73 kg)  07/02/16 161 lb 12.8 oz (73.4 kg)  06/04/16 161 lb 3.2 oz (73.1 kg)   BP is 130/80 by me.   General: Pleasant. Elderly male who is alert and in no acute distress.   HEENT: Normal.  Neck: Supple, no JVD, carotid bruits, or masses noted.  Cardiac: Irregular irregular rhythm. Rate is ok but slow. No murmurs, rubs, or gallops. No edema.  Respiratory:  Lungs are clear to auscultation bilaterally with normal work of breathing.  GI: Soft and nontender.  MS: No deformity or atrophy. Gait and ROM intact.  Skin: Warm and dry. Color is normal.  Neuro:  Strength and sensation are intact and no gross focal deficits noted.  Psych: Alert, appropriate and with normal affect.        LABORATORY DATA:  EKG:  EKG is not ordered today. This demonstrates AF with a very slow VR of 43 noted - reviewed with Dr. [name] here in the office.   Lab Results  Component Value Date   WBC 11.4 (H) 01/04/2015   HGB 11.6 (L) 01/04/2015   HCT 34.0 (L) 01/04/2015   PLT 105 (L) 01/04/2015   GLUCOSE 111 (H) 04/07/2016   ALT 15 04/07/2016   AST 22 04/07/2016   NA 142 04/07/2016   K 4.5 04/07/2016   CL 101 04/07/2016   CREATININE 1.07 04/07/2016   BUN 25 04/07/2016   CO2 28 04/07/2016   TSH 2.37 12/26/2010   INR 2.4 11/04/2016   Lab Results  Component Value Date   INR 2.4 11/04/2016   INR 2.3 09/23/2016   INR 2.0 08/13/2016    BNP (last 3 results)  Recent  Labs  04/07/16 1529  BNP 162.8*    ProBNP (last 3 results) No results for input(s): PROBNP in the last 8760 hours.   Other Studies Reviewed Today:  Echocardiogram done 04/09/16:  Study Conclusions  - Left ventricle: The cavity size was normal. Wall thickness was normal. Indeterminant diastolic function (atrial fibrillation). The estimated ejection fraction was 35%. Diffuse hypokinesis. - Aortic valve: There was no stenosis. There was trivial regurgitation. - Mitral valve: Mildly calcified annulus. There was mild regurgitation. - Left atrium: The atrium was moderately dilated. - Right ventricle: The cavity size was normal. Systolic function was mildly reduced. - Right atrium: The atrium was moderately dilated. - Tricuspid valve: There was severe regurgitation. There was systolic flow reversal in the hepatic vein doppler signal. Peak RV-RA gradient (S): 50 mm Hg. - Pulmonary arteries: PA peak pressure: 65 mm Hg (S). - Systemic veins: IVC measured 2.5 cm with < 50% respirophasic variation, suggesting RA pressure 15 mmHg.  Impressions:  - The patient was in atrial fibrillation. Normal LV size with diffuse hypokinesis, EF 35%. The RV was normal in size with mildly decreased systolic function. Severe tricuspid regurgitation. Moderate pulmonary hypertension.  Assessment/Plan:  1. Ischemic CM - symptoms stable - looks compensated. No changes with current regimen.   2. CAD - no active chest pain - would favor continued medical management.   3. Chronic LV systolic failure  4. Chronic AF - managed with pacemaker/rate control and coumadin therapy.   5. Chronic anticoagulation - some bruising noted - lots of varicosities in his legs - check labs today.   6. Profound bradycardia - on metoprolol. PPM apparently set at 40. I suspect this is so he will not VP. He is on metoprolol for his CHF/CAD - would like to try to  continue. Discussed with Dr. [name] -  will continue with current regimen but plan on showing EKG to Dr. Caryl Comes to see if he would like other adjustments made. Fortunately, I do not get the sense that he is symptomatic but a chronic HR in the low 40's is worrisome to me.   Current medicines are reviewed with the patient today.  The patient does not have concerns regarding medicines other than what has been noted above.  The following changes have been made:  See above.  Labs/ tests ordered today include:    Orders Placed This Encounter  Procedures  . Basic metabolic panel  . CBC with Differential/Platelet  . Hepatic function panel  . Lipid panel  . TSH  . EKG 12-Lead     Disposition:   FU with me in 3 months with EKG.   Patient is agreeable to this plan and will call if any problems develop in the interim.   SignedTruitt Merle, NP  12/07/2016 10:38 AM  [facility] [address] [place], [place]  [address] Phone: [phone] Fax: [phone]      Addendum:  Showed EKG from yesterday to Dr. Caryl Comes - he has advised stopping metoprolol - recheck EKG in 3 weeks.   Patient will be alerted.   Burtis Junes, RN, [facility] [address] [place], [place]  [id number] [phone]

## 2016-12-08 ENCOUNTER — Telehealth: Payer: Self-pay | Admitting: Interventional Cardiology

## 2016-12-08 ENCOUNTER — Other Ambulatory Visit: Payer: Self-pay | Admitting: *Deleted

## 2016-12-08 DIAGNOSIS — Z5181 Encounter for therapeutic drug level monitoring: Secondary | ICD-10-CM

## 2016-12-08 DIAGNOSIS — R7989 Other specified abnormal findings of blood chemistry: Secondary | ICD-10-CM

## 2016-12-08 NOTE — Telephone Encounter (Signed)
Spoke with pt and advised him to stop Metoprolol.  Scheduled pt to come in 12/29/16 for EKG nurse visit.  Pt verbalized understanding and was in agreement with this plan.

## 2016-12-08 NOTE — Telephone Encounter (Signed)
Adam Merle, NP had Dr. Caryl Comes review pt's EKG.  Dr. Caryl Comes and Cecille Rubin recommended pt stop Metoprolol and come back in 3 weeks for an EKG.  Attempted to call pt.  Left message to call back.

## 2016-12-09 ENCOUNTER — Telehealth: Payer: Self-pay | Admitting: Interventional Cardiology

## 2016-12-09 NOTE — Telephone Encounter (Signed)
Returned call to wife.  She was not available however spoke to the patient and made sure he understood Jennifer's directions from yesterday.  He has stopped his Metoprolol and will come in on 12/29/16 for EKG.

## 2016-12-09 NOTE — Telephone Encounter (Signed)
New message   Pt wife is calling returning call to [employer].

## 2016-12-11 ENCOUNTER — Telehealth: Payer: Self-pay | Admitting: Nurse Practitioner

## 2016-12-11 NOTE — Telephone Encounter (Signed)
New Message     Pt has questions about the CT he is having on Tuesday, pt has been having extreme body heat and waking at night with night sweats,  He is concerned with having the dye injected and would like to speak with Truitt Merle

## 2016-12-14 NOTE — Telephone Encounter (Signed)
s/w pt is aware needs to call [facility] about CT scan and PCP about night sweats.

## 2016-12-14 NOTE — Telephone Encounter (Signed)
Ok to reschedule this CT - it is ordered by [facility]. I would encourage him to call and talk with them. Would recommend speaking to his PCP about the night sweats and body heat.

## 2016-12-15 ENCOUNTER — Ambulatory Visit
Admission: RE | Admit: 2016-12-15 | Discharge: 2016-12-15 | Disposition: A | Discharge status: Home / Self Care | Payer: Medicare Other | Source: Ambulatory Visit | Attending: Family | Admitting: Family

## 2016-12-15 DIAGNOSIS — Z95828 Presence of other vascular implants and grafts: Secondary | ICD-10-CM

## 2016-12-15 DIAGNOSIS — I714 Abdominal aortic aneurysm, without rupture, unspecified: Secondary | ICD-10-CM

## 2016-12-15 DIAGNOSIS — Z48812 Encounter for surgical aftercare following surgery on the circulatory system: Secondary | ICD-10-CM

## 2016-12-15 MED ORDER — IOPAMIDOL (ISOVUE-370) INJECTION 76%
75.0000 mL | Freq: Once | INTRAVENOUS | Status: AC | PRN
  Administered 2016-12-15: 75 mL via INTRAVENOUS

## 2016-12-16 ENCOUNTER — Ambulatory Visit (INDEPENDENT_AMBULATORY_CARE_PROVIDER_SITE_OTHER): Payer: Medicare Other | Admitting: *Deleted

## 2016-12-16 DIAGNOSIS — I4891 Unspecified atrial fibrillation: Secondary | ICD-10-CM

## 2016-12-16 DIAGNOSIS — Z5181 Encounter for therapeutic drug level monitoring: Secondary | ICD-10-CM

## 2016-12-16 DIAGNOSIS — I482 Chronic atrial fibrillation, unspecified: Secondary | ICD-10-CM

## 2016-12-16 DIAGNOSIS — I255 Ischemic cardiomyopathy: Secondary | ICD-10-CM | POA: Diagnosis not present

## 2016-12-16 LAB — POCT INR: INR: 2.1

## 2016-12-17 ENCOUNTER — Encounter: Payer: Self-pay | Admitting: Vascular Surgery

## 2016-12-18 ENCOUNTER — Ambulatory Visit: Payer: Medicare Other | Admitting: Vascular Surgery

## 2016-12-18 DIAGNOSIS — I10 Essential (primary) hypertension: Secondary | ICD-10-CM | POA: Diagnosis not present

## 2016-12-18 DIAGNOSIS — R413 Other amnesia: Secondary | ICD-10-CM | POA: Diagnosis not present

## 2016-12-18 DIAGNOSIS — M549 Dorsalgia, unspecified: Secondary | ICD-10-CM | POA: Diagnosis not present

## 2016-12-18 DIAGNOSIS — E785 Hyperlipidemia, unspecified: Secondary | ICD-10-CM | POA: Diagnosis not present

## 2016-12-18 DIAGNOSIS — Z0000 Encounter for general adult medical examination without abnormal findings: Secondary | ICD-10-CM | POA: Diagnosis not present

## 2016-12-18 DIAGNOSIS — Z79899 Other long term (current) drug therapy: Secondary | ICD-10-CM | POA: Diagnosis not present

## 2016-12-18 DIAGNOSIS — R58 Hemorrhage, not elsewhere classified: Secondary | ICD-10-CM | POA: Diagnosis not present

## 2016-12-21 NOTE — Telephone Encounter (Signed)
Follow up  Pts wife voiced PCP has prescribed Celebrex for arthritis wants to know if it's okay to take due to taking heart medications.  Please f/u

## 2016-12-22 ENCOUNTER — Other Ambulatory Visit: Payer: Medicare Other | Admitting: *Deleted

## 2016-12-22 ENCOUNTER — Telehealth: Payer: Self-pay | Admitting: *Deleted

## 2016-12-22 DIAGNOSIS — E875 Hyperkalemia: Secondary | ICD-10-CM

## 2016-12-22 DIAGNOSIS — R946 Abnormal results of thyroid function studies: Secondary | ICD-10-CM | POA: Diagnosis not present

## 2016-12-22 DIAGNOSIS — Z5181 Encounter for therapeutic drug level monitoring: Secondary | ICD-10-CM | POA: Diagnosis not present

## 2016-12-22 DIAGNOSIS — R7989 Other specified abnormal findings of blood chemistry: Secondary | ICD-10-CM

## 2016-12-22 LAB — BASIC METABOLIC PANEL
BUN/Creatinine Ratio: 21 (ref 10–24)
BUN: 23 mg/dL (ref 8–27)
CO2: 26 mmol/L (ref 18–29)
Calcium: 9.6 mg/dL (ref 8.6–10.2)
Chloride: 101 mmol/L (ref 96–106)
Creatinine, Ser: 1.09 mg/dL (ref 0.76–1.27)
GFR calc Af Amer: 69 mL/min/{1.73_m2} (ref >59)
GFR calc non Af Amer: 60 mL/min/{1.73_m2} (ref >59)
Glucose: 107 mg/dL — ABNORMAL HIGH (ref 65–99)
Potassium: 5.8 mmol/L — ABNORMAL HIGH (ref 3.5–5.2)
Sodium: 143 mmol/L (ref 134–144)

## 2016-12-22 LAB — TSH: TSH: 6.72 u[IU]/mL — ABNORMAL HIGH (ref 0.450–4.500)

## 2016-12-22 NOTE — Telephone Encounter (Signed)
Spoke with wife (DPR) and advised her of lab results and orders per Truitt Merle, NP. Wife verbalized understanding and was in agreement with this plan. Pt will come by Friday for repeat labs.

## 2016-12-22 NOTE — Telephone Encounter (Signed)
-----   Message from Burtis Junes, NP sent at 12/22/2016  3:41 PM EDT ----- Ok to report. Labs are stable - but potassium is higher. Needs to stop his Lisinopril. BMET on Friday.

## 2016-12-23 DIAGNOSIS — E039 Hypothyroidism, unspecified: Secondary | ICD-10-CM | POA: Diagnosis not present

## 2016-12-23 NOTE — Telephone Encounter (Signed)
I think I would hold this for now too if it has potassium in it.

## 2016-12-23 NOTE — Telephone Encounter (Signed)
I think I would hold on this until we can get his potassium down - see yesterday's lab note.

## 2016-12-23 NOTE — Telephone Encounter (Signed)
s/w pt wife per (dpr) is aware not to take celebrex until pt get's potassium down. Pt's wife stated pt is taking OTC Heather's tummy fiber and pt's wife researched this medication stated it does list potassium is in this medication but does not list the mg. Stated has helped pt a lot.  Stated to stay on unless I call back and Cecille Rubin states otherwise. Will send to Cecille Rubin to advise.

## 2016-12-23 NOTE — Telephone Encounter (Signed)
s/w wife per (DPR) is to hold Heather's Tummy Fiber.

## 2016-12-24 NOTE — Progress Notes (Signed)
Established EVAR   History of Present Illness   Adam [name] is a 83 y.o. (Sep 11, 1924) male who presents for routine follow up s/p EVAR (Date: 07/28/11).  Most recent EVAR duplex (Date: 12/10/14) demonstrates: no endoleak and decreasing sac size, 4.5 cm x 4.6 cm.  The patient has not had back or abdominal pain.  The pt notes no extreme changes in BP.  He does not some increase serum potassium.  The patient's PMH, PSH, SH, and FamHx are unchanged from 12/14/14.  Current Outpatient Prescriptions  Medication Sig Dispense Refill  . acetaminophen (TYLENOL) 650 MG CR tablet Take 650 mg by mouth every 8 (eight) hours as needed for pain.     [name] atorvastatin (LIPITOR) 10 MG tablet Take 1 tablet (10 mg total) by mouth daily. 90 tablet 2  . Cholecalciferol (VITAMIN D) 2000 UNITS CAPS Take 2,000 Units by mouth daily.    . furosemide (LASIX) 40 MG tablet TAKE 1.5 TABLETS (60 MG TOTAL) BY MOUTH DAILY. 135 tablet 3  . latanoprost (XALATAN) 0.005 % ophthalmic solution Place 1 drop into both eyes at bedtime.    . Multiple Vitamins-Minerals (RA VISION-VITE PRESERVE PO) Take 1 tablet by mouth daily.     . nitroGLYCERIN (NITROSTAT) 0.4 MG SL tablet Place 0.4 mg under the tongue every 5 (five) minutes as needed. For chest pain    . Omega-3 Fatty Acids (FISH OIL) 1000 MG CPDR Take 1 capsule by mouth daily.     . polyethylene glycol (MIRALAX / GLYCOLAX) packet Take 17 g by mouth daily as needed (ibs).    . silodosin (RAPAFLO) 8 MG CAPS capsule Take 8 mg by mouth daily at 6 PM.     . warfarin (COUMADIN) 5 MG tablet TAKE AS DIRECTED PER CLINIC INSTRUCTIONS 90 tablet 0   No current facility-administered medications for this visit.     Allergies  Allergen Reactions  . Morphine Nausea Only  . Amiodarone Anxiety and Rash    On ROS today: elevation in serum K, no abdominal or back pain   Physical Examination   Vitals:   12/25/16 1044 12/25/16 1045  BP: (!) 145/61 (!) 142/60  Pulse: (!) 51   Resp: 20     Temp: 97 F (36.1 C)   TempSrc: Oral   SpO2: 100%   Weight: 160 lb (72.6 kg)   Height: 5\' 8"  (1.727 m)     Body mass index is 24.33 kg/m.  General Alert, O x 3, Cachectic, elderly  Pulmonary Sym exp, good B air movt, CTA B  Cardiac RRR, Nl S1, S2, no Murmurs, No rubs, No S3,S4  Vascular Vessel Right Left  Radial Palpable Palpable  Brachial Palpable Palpable  Carotid Palpable, No Bruit Palpable, No Bruit  Aorta Not palpable N/A  Femoral Palpable Palpable  Popliteal Not palpable Not palpable  PT Palpable Palpable  DP Palpable Palpable    Gastrointestinal soft, non-distended, non-tender to palpation, No guarding or rebound, no HSM, no masses, no CVAT B, No palpable prominent aortic pulse,    Musculoskeletal M/S 5/5 throughout  , Extremities without ischemic changes  , No edema present,  Neurologic Pain and light touch intact in extremities  , Motor exam as listed above    Non-Invasive Vascular Imaging    CTA Abd/Pelvis Duplex (12/15/16) 1. Patent infrarenal bifurcated stent graft with no endoleak currently visible, stable 5.3 native aneurysm sac diameter.  NON-VASCULAR  1. New 4.1 cm left adrenal mass suggesting primary or secondary neoplasm. This  is approachable for percutaneous biopsy if indicated.  Based on my review of this patient's CTA, endograft is in good position without any endoleak evident.  There is no enlargement of the aortic sac and no evidence of limb dysfunction.   Medical Decision Making   Adam [name] is a 83 y.o. male who presents s/p EVAR.  Pt is asymptomatic with no endoleak and stable sac size, Large L adrenal mass, R adrenal adenoma   I discussed with the patient the importance of surveillance of the endograft.  The next endograft duplex will be scheduled for 12 months.  The next CTA will be scheduled at 5 years after EVAR due to OVER study increased incidence of Type 2 endoleak.  The patient will follow up with [place] in 6 months with  these studies.  In regards to the left adrenal mass, I will refer him to General Surgery for further evaluation of such.  Thank you for allowing [place] to participate in this patient's care.   Adele Barthel, MD, FACS [facility] Office: [phone] Pager: [phone]

## 2016-12-25 ENCOUNTER — Ambulatory Visit (INDEPENDENT_AMBULATORY_CARE_PROVIDER_SITE_OTHER): Payer: Medicare Other | Admitting: Vascular Surgery

## 2016-12-25 ENCOUNTER — Encounter: Payer: Self-pay | Admitting: Vascular Surgery

## 2016-12-25 ENCOUNTER — Other Ambulatory Visit: Payer: Medicare Other | Admitting: *Deleted

## 2016-12-25 VITALS — BP 142/60 | HR 51 | Temp 97.0°F | Resp 20 | Ht 68.0 in | Wt 160.0 lb

## 2016-12-25 DIAGNOSIS — E875 Hyperkalemia: Secondary | ICD-10-CM

## 2016-12-25 DIAGNOSIS — E279 Disorder of adrenal gland, unspecified: Secondary | ICD-10-CM | POA: Diagnosis not present

## 2016-12-25 DIAGNOSIS — I255 Ischemic cardiomyopathy: Secondary | ICD-10-CM | POA: Diagnosis not present

## 2016-12-25 DIAGNOSIS — I714 Abdominal aortic aneurysm, without rupture, unspecified: Secondary | ICD-10-CM

## 2016-12-25 DIAGNOSIS — E278 Other specified disorders of adrenal gland: Secondary | ICD-10-CM | POA: Insufficient documentation

## 2016-12-25 LAB — BASIC METABOLIC PANEL
BUN/Creatinine Ratio: 20 (ref 10–24)
BUN: 21 mg/dL (ref 8–27)
CO2: 27 mmol/L (ref 18–29)
Calcium: 10 mg/dL (ref 8.6–10.2)
Chloride: 100 mmol/L (ref 96–106)
Creatinine, Ser: 1.05 mg/dL (ref 0.76–1.27)
GFR calc Af Amer: 72 mL/min/{1.73_m2} (ref >59)
GFR calc non Af Amer: 63 mL/min/{1.73_m2} (ref >59)
Glucose: 100 mg/dL — ABNORMAL HIGH (ref 65–99)
Potassium: 5.2 mmol/L (ref 3.5–5.2)
Sodium: 141 mmol/L (ref 134–144)

## 2016-12-28 NOTE — Addendum Note (Signed)
Addended by: Lianne Cure A on: 12/28/2016 02:59 PM   Modules accepted: Orders

## 2016-12-29 ENCOUNTER — Telehealth: Payer: Self-pay | Admitting: *Deleted

## 2016-12-29 ENCOUNTER — Ambulatory Visit (INDEPENDENT_AMBULATORY_CARE_PROVIDER_SITE_OTHER): Payer: Medicare Other | Admitting: *Deleted

## 2016-12-29 VITALS — HR 73

## 2016-12-29 DIAGNOSIS — R001 Bradycardia, unspecified: Secondary | ICD-10-CM

## 2016-12-29 NOTE — Progress Notes (Signed)
PT  HERE  FOR  EKG TO  F/U ON  BRADYCARDIA SINCE  STOPPING  METOPROLOL .Adonis Housekeeper

## 2016-12-29 NOTE — Telephone Encounter (Signed)
PT  CAME IN  FOR  EKG TODAY   TO  F/U ON   STOPPING METOPROLOL  WHILE HERE  PT ASKED  IF  COULD  TAKE  CELEBREX  WILL FORWARD TO  [name]  FOR  REVIEW .Adonis Housekeeper

## 2016-12-29 NOTE — Telephone Encounter (Signed)
No

## 2016-12-31 ENCOUNTER — Ambulatory Visit (INDEPENDENT_AMBULATORY_CARE_PROVIDER_SITE_OTHER): Payer: Medicare Other | Admitting: *Deleted

## 2016-12-31 DIAGNOSIS — I495 Sick sinus syndrome: Secondary | ICD-10-CM

## 2016-12-31 LAB — CUP PACEART REMOTE DEVICE CHECK
Battery Remaining Longevity: 118 mo
Brady Statistic RV Percent Paced: 14 %
Implantable Lead Model: 4470
Implantable Lead Serial Number: 499409
Lead Channel Impedance Value: 486 Ohm
Lead Channel Pacing Threshold Amplitude: 1.125 V
Lead Channel Setting Pacing Amplitude: 2.5 V
Lead Channel Setting Pacing Pulse Width: 0.4 ms
MDC IDC LEAD IMPLANT DT: 20080818
MDC IDC LEAD IMPLANT DT: 20080818
MDC IDC LEAD LOCATION: 753859
MDC IDC LEAD LOCATION: 753860
MDC IDC LEAD SERIAL: 602985
MDC IDC MSMT BATTERY IMPEDANCE: 277 Ohm
MDC IDC MSMT BATTERY VOLTAGE: 2.78 V
MDC IDC MSMT LEADCHNL RA IMPEDANCE VALUE: 67 Ohm
MDC IDC MSMT LEADCHNL RV PACING THRESHOLD PULSEWIDTH: 0.4 ms
MDC IDC PG IMPLANT DT: 20150320
MDC IDC SESS DTM: 20180517142313
MDC IDC SET LEADCHNL RV SENSING SENSITIVITY: 5.6 mV

## 2016-12-31 NOTE — Progress Notes (Signed)
Remote pacemaker transmission.   

## 2017-01-01 ENCOUNTER — Encounter: Payer: Self-pay | Admitting: Cardiology

## 2017-01-07 DIAGNOSIS — L821 Other seborrheic keratosis: Secondary | ICD-10-CM | POA: Diagnosis not present

## 2017-01-07 DIAGNOSIS — L570 Actinic keratosis: Secondary | ICD-10-CM | POA: Diagnosis not present

## 2017-01-07 DIAGNOSIS — B351 Tinea unguium: Secondary | ICD-10-CM | POA: Diagnosis not present

## 2017-01-07 DIAGNOSIS — D1801 Hemangioma of skin and subcutaneous tissue: Secondary | ICD-10-CM | POA: Diagnosis not present

## 2017-01-07 DIAGNOSIS — D692 Other nonthrombocytopenic purpura: Secondary | ICD-10-CM | POA: Diagnosis not present

## 2017-01-07 DIAGNOSIS — Z85828 Personal history of other malignant neoplasm of skin: Secondary | ICD-10-CM | POA: Diagnosis not present

## 2017-01-07 DIAGNOSIS — D225 Melanocytic nevi of trunk: Secondary | ICD-10-CM | POA: Diagnosis not present

## 2017-01-17 ENCOUNTER — Encounter (HOSPITAL_COMMUNITY): Payer: Self-pay | Admitting: *Deleted

## 2017-01-17 ENCOUNTER — Emergency Department (HOSPITAL_COMMUNITY)
Admission: EM | Admit: 2017-01-17 | Discharge: 2017-01-17 | Disposition: A | Discharge status: Home / Self Care | Payer: Medicare Other | Attending: Emergency Medicine | Admitting: Emergency Medicine

## 2017-01-17 DIAGNOSIS — Z79899 Other long term (current) drug therapy: Secondary | ICD-10-CM | POA: Diagnosis not present

## 2017-01-17 DIAGNOSIS — I110 Hypertensive heart disease with heart failure: Secondary | ICD-10-CM | POA: Insufficient documentation

## 2017-01-17 DIAGNOSIS — R030 Elevated blood-pressure reading, without diagnosis of hypertension: Secondary | ICD-10-CM | POA: Diagnosis not present

## 2017-01-17 DIAGNOSIS — Z87891 Personal history of nicotine dependence: Secondary | ICD-10-CM | POA: Insufficient documentation

## 2017-01-17 DIAGNOSIS — Z955 Presence of coronary angioplasty implant and graft: Secondary | ICD-10-CM | POA: Diagnosis not present

## 2017-01-17 DIAGNOSIS — M545 Low back pain, unspecified: Secondary | ICD-10-CM

## 2017-01-17 DIAGNOSIS — Z8679 Personal history of other diseases of the circulatory system: Secondary | ICD-10-CM | POA: Diagnosis not present

## 2017-01-17 DIAGNOSIS — Z96653 Presence of artificial knee joint, bilateral: Secondary | ICD-10-CM | POA: Insufficient documentation

## 2017-01-17 DIAGNOSIS — I5022 Chronic systolic (congestive) heart failure: Secondary | ICD-10-CM | POA: Diagnosis not present

## 2017-01-17 DIAGNOSIS — R509 Fever, unspecified: Secondary | ICD-10-CM | POA: Diagnosis not present

## 2017-01-17 DIAGNOSIS — Z7901 Long term (current) use of anticoagulants: Secondary | ICD-10-CM | POA: Diagnosis not present

## 2017-01-17 DIAGNOSIS — Z950 Presence of cardiac pacemaker: Secondary | ICD-10-CM | POA: Insufficient documentation

## 2017-01-17 DIAGNOSIS — Z951 Presence of aortocoronary bypass graft: Secondary | ICD-10-CM | POA: Diagnosis not present

## 2017-01-17 DIAGNOSIS — I2510 Atherosclerotic heart disease of native coronary artery without angina pectoris: Secondary | ICD-10-CM | POA: Insufficient documentation

## 2017-01-17 DIAGNOSIS — R531 Weakness: Secondary | ICD-10-CM

## 2017-01-17 LAB — CBC WITH DIFFERENTIAL/PLATELET
Basophils Absolute: 0 10*3/uL (ref 0.0–0.1)
Basophils Relative: 0 %
EOS ABS: 0 10*3/uL (ref 0.0–0.7)
Eosinophils Relative: 0 %
HCT: 35.1 % — ABNORMAL LOW (ref 39.0–52.0)
Hemoglobin: 12.1 g/dL — ABNORMAL LOW (ref 13.0–17.0)
LYMPHS ABS: 0.6 10*3/uL — AB (ref 0.7–4.0)
Lymphocytes Relative: 7 %
MCH: 33.1 pg (ref 26.0–34.0)
MCHC: 34.5 g/dL (ref 30.0–36.0)
MCV: 95.9 fL (ref 78.0–100.0)
Monocytes Absolute: 0.9 10*3/uL (ref 0.1–1.0)
Monocytes Relative: 11 %
Neutro Abs: 6.6 10*3/uL (ref 1.7–7.7)
Neutrophils Relative %: 81 %
PLATELETS: 87 10*3/uL — AB (ref 150–400)
RBC: 3.66 MIL/uL — AB (ref 4.22–5.81)
RDW: 14.2 % (ref 11.5–15.5)
WBC: 8.1 10*3/uL (ref 4.0–10.5)

## 2017-01-17 LAB — URINALYSIS, ROUTINE W REFLEX MICROSCOPIC
Bilirubin Urine: NEGATIVE
GLUCOSE, UA: NEGATIVE mg/dL
HGB URINE DIPSTICK: NEGATIVE
KETONES UR: 5 mg/dL — AB
LEUKOCYTES UA: NEGATIVE
Nitrite: NEGATIVE
PROTEIN: NEGATIVE mg/dL
Specific Gravity, Urine: 1.014 (ref 1.005–1.030)
pH: 5 (ref 5.0–8.0)

## 2017-01-17 LAB — PROTIME-INR
INR: 2.45
PROTHROMBIN TIME: 27 s — AB (ref 11.4–15.2)

## 2017-01-17 LAB — I-STAT CHEM 8, ED
BUN: 19 mg/dL (ref 6–20)
CREATININE: 0.8 mg/dL (ref 0.61–1.24)
Calcium, Ion: 1.04 mmol/L — ABNORMAL LOW (ref 1.15–1.40)
Chloride: 104 mmol/L (ref 101–111)
GLUCOSE: 107 mg/dL — AB (ref 65–99)
HEMATOCRIT: 32 % — AB (ref 39.0–52.0)
HEMOGLOBIN: 10.9 g/dL — AB (ref 13.0–17.0)
Potassium: 4.2 mmol/L (ref 3.5–5.1)
Sodium: 139 mmol/L (ref 135–145)
TCO2: 26 mmol/L (ref 0–100)

## 2017-01-17 LAB — I-STAT TROPONIN, ED: Troponin i, poc: 0.02 ng/mL (ref 0.00–0.08)

## 2017-01-17 LAB — I-STAT CG4 LACTIC ACID, ED: Lactic Acid, Venous: 1.46 mmol/L (ref 0.5–1.9)

## 2017-01-17 MED ORDER — SODIUM CHLORIDE 0.9 % IV BOLUS (SEPSIS)
500.0000 mL | Freq: Once | INTRAVENOUS | Status: AC
  Administered 2017-01-17: 500 mL via INTRAVENOUS

## 2017-01-17 NOTE — ED Triage Notes (Signed)
Per EMS, pt reports fever and chills. Pt's wife states she called EMS to get seen faster. Pt is not immunocompromised. Pt denies cough, pain or other complaints.   EMS VITALS BP 146/94 HR 62 Temp: 99.7

## 2017-01-17 NOTE — Discharge Instructions (Signed)
All the results in the ER are normal, labs and imaging. We are not sure what is causing your symptoms. The workup in the ER is not complete, and is limited to screening for life threatening and emergent conditions only, so please see a primary care doctor for further evaluation.  RETURN TO THE ER IMMEDIATELY IF YOU HAVE INCREASED PAIN IN THE ABDOMEN, BACK OR YOU HAVE DIZZINESS, SWEATING, NEAR FAINTING, OR FAINTING.

## 2017-01-17 NOTE — ED Notes (Signed)
Bed: WA08 Expected date:  Expected time:  Means of arrival:  Comments: 83 yo fever

## 2017-01-18 NOTE — Telephone Encounter (Signed)
PT  [place]./CY 

## 2017-01-20 DIAGNOSIS — R509 Fever, unspecified: Secondary | ICD-10-CM | POA: Diagnosis not present

## 2017-01-20 DIAGNOSIS — R05 Cough: Secondary | ICD-10-CM | POA: Diagnosis not present

## 2017-01-20 DIAGNOSIS — R39198 Other difficulties with micturition: Secondary | ICD-10-CM | POA: Diagnosis not present

## 2017-01-23 NOTE — ED Provider Notes (Signed)
[facility] DEPT Provider Note   CSN: [id number] Arrival date & time: 01/17/17  1841     History   Chief Complaint Chief Complaint  Patient presents with  . Fever    HPI ISSAIH KAUS is a 83 y.o. male.  HPI PT comes in with cc of weakness. Pt reports that the last 2 days he has been feeling weak. PT has had some dizziness and low back pain and suprapubic pain. Pt has hx of AAA, CAD, CHF, AF on anticoagulation. PT has been having subjective fevers. Pt denies nausea, emesis, chest pains, shortness of breath, headaches, abdominal pain, uti like symptoms.   Past Medical History:  Diagnosis Date  . AAA (abdominal aortic aneurysm) ([place])   . Anginal pain ([place])   . Arthritis   . CAD (coronary artery disease)    [name]  . CHF (congestive heart failure) ([place])   . Chronic anticoagulation   . Diverticulosis   . GERD (gastroesophageal reflux disease)   . Hyperlipidemia   . Hypertension   . LBP (low back pain)   . Permanent atrial fibrillation ([place])    decision made at [facility]  . Presence of permanent cardiac pacemaker   . S/P CABG (coronary artery bypass graft)   . Shortness of breath dyspnea   . Sleep apnea     Patient Active Problem List   Diagnosis Date Noted  . Mass of both adrenal glands ([place]) 12/25/2016  . Right inguinal hernia 12/03/2014  . Sleep apnea 06/05/2014  . Chronic systolic heart failure ([place]) 02/21/2014  . Long term current use of anticoagulant therapy 11/21/2013  . Encounter for therapeutic drug monitoring 10/06/2013  . S/P AAA (abdominal aortic aneurysm) repair 12/11/2011  . HYPERTENSION, HEART CONTROLLED W/O ASSOC CHF 10/08/2010  . CORONARY ATHEROSCLEROSIS, ARTERY BYPASS GRAFT 10/08/2010  . Chronic atrial fibrillation ([place]) 10/08/2010  . PACEMAKER, PERMANENT-[employer] 10/08/2010    Past Surgical History:  Procedure Laterality Date  . CARDIOVERSION  06/30/2012   Procedure: CARDIOVERSION;  Surgeon: Sinclair Grooms, MD;   Location: [facility] ENDOSCOPY;  Service: Cardiovascular;  Laterality: N/A;  . CARDIOVERSION N/A 11/23/2013   Procedure: CARDIOVERSION;  Surgeon: Sinclair Grooms, MD;  Location: [place];  Service: Cardiovascular;  Laterality: N/A;  . CATARACT EXTRACTION W/ INTRAOCULAR LENS  IMPLANT, BILATERAL    . CORONARY ARTERY BYPASS GRAFT  Feb 1989   "CABG X6"  . ENDOVASCULAR STENT INSERTION  07/28/2011   Procedure: ENDOVASCULAR STENT GRAFT INSERTION;  Surgeon: Hinda Lenis, MD;  Location: [place];  Service: Vascular;  Laterality: N/A;  Pt. on Coumadin/ instructed to hold 5 days prior to procedure  . EYE SURGERY    . INGUINAL HERNIA REPAIR Right 01/04/2015  . INGUINAL HERNIA REPAIR Right 01/04/2015   Procedure:  OPEN REPAIR RIGHT INGUNIAL HERNIA;  Surgeon: Fanny Skates, MD;  Location: [place];  Service: General;  Laterality: Right;  . INSERT / REPLACE / REMOVE PACEMAKER  July 2008  . INSERTION OF MESH Right 01/04/2015   Procedure: INSERTION OF MESH;  Surgeon: Fanny Skates, MD;  Location: [place];  Service: General;  Laterality: Right;  . JOINT REPLACEMENT  1995 & 1996   Bilateral total knee replacements  . LEFT HEART CATHETERIZATION WITH CORONARY ANGIOGRAM N/A 03/28/2014   Procedure: LEFT HEART CATHETERIZATION WITH CORONARY ANGIOGRAM;  Surgeon: Sinclair Grooms, MD;  Location: [facility] CATH LAB;  Service: Cardiovascular;  Laterality: N/A;  . PERMANENT PACEMAKER GENERATOR CHANGE N/A 11/03/2013  Procedure: PERMANENT PACEMAKER GENERATOR CHANGE;  Surgeon: [name], MD;  Location: [facility] CATH LAB;  Service: Cardiovascular;  Laterality: N/A;  . TONSILLECTOMY         Home Medications    Prior to Admission medications   Medication Sig Start Date End Date Taking? Authorizing Provider  atorvastatin (LIPITOR) 10 MG tablet Take 1 tablet (10 mg total) by mouth daily. Patient taking differently: Take 10 mg by mouth every evening.  11/08/15  Yes [name], MD  celecoxib (CELEBREX) 100 MG capsule Take 100 mg by  mouth 2 (two) times daily. 12/18/16  Yes [provider]  furosemide (LASIX) 40 MG tablet TAKE 1.5 TABLETS (60 MG TOTAL) BY MOUTH DAILY. 06/18/16  Yes [name], MD  latanoprost (XALATAN) 0.005 % ophthalmic solution Place 1 drop into both eyes at bedtime.   Yes [provider]  levothyroxine (SYNTHROID, LEVOTHROID) 25 MCG tablet Take 25 mcg by mouth daily. 12/23/16  Yes [provider]  Multiple Vitamins-Minerals (RA VISION-VITE PRESERVE PO) Take 1 tablet by mouth daily.    Yes [provider]  nitroGLYCERIN (NITROSTAT) 0.4 MG SL tablet Place 0.4 mg under the tongue every 5 (five) minutes as needed. For chest pain   Yes [provider]  Probiotic Product (PROBIOTIC DAILY) CAPS Take 1 capsule by mouth daily as needed (for ibs).   Yes [provider]  silodosin (RAPAFLO) 8 MG CAPS capsule Take 8 mg by mouth daily at 6 PM.  04/17/14  Yes [provider]  warfarin (COUMADIN) 5 MG tablet TAKE AS DIRECTED PER CLINIC INSTRUCTIONS Patient taking differently: Take 5 mg by mouth daily at 6 PM. TAKE AS DIRECTED PER CLINIC INSTRUCTIONS 11/02/16  Yes [name], MD  Cholecalciferol (VITAMIN D) 2000 UNITS CAPS Take 2,000 Units by mouth daily.    [provider]  Omega-3 Fatty Acids (FISH OIL) 1000 MG CPDR Take 1 capsule by mouth daily.     [provider]    Family History Family History  Problem Relation Age of Onset  . Stroke Mother   . Heart failure Mother   . Varicose Veins Mother   . Heart attack Father   . Heart disease Father        Before age 18    Heart Attack age 24 and 60  . Cancer Sister        breast  . Heart disease Sister        After age 37  . Other Sister        DJD    Social History Social History  Substance Use Topics  . Smoking status: Former Smoker    Years: 3.00    Types: Cigarettes    Quit date: 08/17/1948  . Smokeless tobacco: Never Used  . Alcohol use No     Allergies   Lisinopril;  Morphine; and Amiodarone   Review of Systems Review of Systems  All other systems reviewed and are negative.    Physical Exam Updated Vital Signs BP (!) 120/96   Pulse 78   Temp 99.5 F (37.5 C) (Oral)   Resp 20   SpO2 95%   Physical Exam  Constitutional: He is oriented to person, place, and time. He appears well-developed.  HENT:  Head: Normocephalic and atraumatic.  Eyes: Conjunctivae and EOM are normal. Pupils are equal, round, and reactive to light.  Neck: Normal range of motion. Neck supple.  Cardiovascular: Normal rate.   Murmur heard. Pulmonary/Chest: Effort  normal and breath sounds normal.  Abdominal: Soft. Bowel sounds are normal. He exhibits no distension and no mass. There is no tenderness. There is no rebound and no guarding.  Musculoskeletal: He exhibits no deformity.  Neurological: He is alert and oriented to person, place, and time.  Skin: Skin is warm.  Nursing note and vitals reviewed.    ED Treatments / Results  Labs (all labs ordered are listed, but only abnormal results are displayed) Labs Reviewed  CBC WITH DIFFERENTIAL/PLATELET - Abnormal; Notable for the following:       Result Value   RBC 3.66 (*)    Hemoglobin 12.1 (*)    HCT 35.1 (*)    Platelets 87 (*)    Lymphs Abs 0.6 (*)    All other components within normal limits  PROTIME-INR - Abnormal; Notable for the following:    Prothrombin Time 27.0 (*)    All other components within normal limits  URINALYSIS, ROUTINE W REFLEX MICROSCOPIC - Abnormal; Notable for the following:    Ketones, ur 5 (*)    All other components within normal limits  I-STAT CHEM 8, ED - Abnormal; Notable for the following:    Glucose, Bld 107 (*)    Calcium, Ion 1.04 (*)    Hemoglobin 10.9 (*)    HCT 32.0 (*)    All other components within normal limits  I-STAT TROPOININ, ED  I-STAT CG4 LACTIC ACID, ED  I-STAT CHEM 8, ED  I-STAT CG4 LACTIC ACID, ED    EKG  EKG Interpretation  Date/Time:  Sunday January 17 2017 18:54:28 EDT Ventricular Rate:  75 PR Interval:    QRS Duration: 94 QT Interval:  394 QTC Calculation: 441 R Axis:   -29 Text Interpretation:  Atrial fibrillation Borderline left axis deviation Abnormal R-wave progression, early transition No acute changes Confirmed by Varney Biles [phone]) on 01/17/2017 8:18:36 PM Also confirmed by Varney Biles [phone]), editor Verna Czech [phone])  on 01/18/2017 7:27:50 AM       Radiology No results found.  Procedures Procedures (including critical care time)  Medications Ordered in ED Medications  sodium chloride 0.9 % bolus 500 mL (0 mLs Intravenous Stopped 01/17/17 2046)     Initial Impression / Assessment and Plan / ED Course  I have reviewed the triage vital signs and the nursing notes.  Pertinent labs & imaging results that were available during my care of the patient were reviewed by me and considered in my medical decision making (see chart for details).     PT comes in with generalized weakness. He has hx of AAA and multiple other medical comorbidities. Pt is having some abd pain and back pain. He thinks the pain is from OA. UA ordered, as UTI can cause vague abd pain / back pain and weakness. PT has AAA hx, but the CBC is normal and the exam is not peritoneal. Still leaky AAA is in the ddx. We will reassess once all the lab results about the next step.  LATE ENTRY: Results from the ER workup discussed with the patient face to face and all questions answered to the best of my ability. We discussed the concerns of leaky AAA for the weakness, dizziness, abd pain and back pain. Pt doesn't think it is his AAA. He doesn't want to stay in the ER and get CT angio.  Patient wants to leave against medical advice. Patient understands that his/her actions will lead to inadequate medical workup, and that he/she is at risk  of complications of missed diagnosis, which includes morbidity and mortality.  Alternative options discussed -  observing a bit longer in the ER. Opportunity to change mind given. Discussion witnessed by patient's family Patient is demonstrating good capacity to make decision. Patient understands that he needs to return to the ER immediately if his symptoms get worse.    Final Clinical Impressions(s) / ED Diagnoses   Final diagnoses:  Generalized weakness  Midline low back pain without sciatica, unspecified chronicity  History of abdominal aortic aneurysm (AAA)    New Prescriptions Discharge Medication List as of 01/17/2017 10:36 PM       Varney Biles, MD 01/23/17 [phone]

## 2017-01-25 ENCOUNTER — Emergency Department (HOSPITAL_COMMUNITY)
Admission: EM | Admit: 2017-01-25 | Discharge: 2017-01-25 | Disposition: A | Discharge status: Home / Self Care | Payer: Medicare Other | Attending: Emergency Medicine | Admitting: Emergency Medicine

## 2017-01-25 ENCOUNTER — Emergency Department (HOSPITAL_COMMUNITY): Payer: Medicare Other

## 2017-01-25 ENCOUNTER — Encounter (HOSPITAL_COMMUNITY): Payer: Self-pay

## 2017-01-25 DIAGNOSIS — I110 Hypertensive heart disease with heart failure: Secondary | ICD-10-CM | POA: Insufficient documentation

## 2017-01-25 DIAGNOSIS — S0990XA Unspecified injury of head, initial encounter: Secondary | ICD-10-CM | POA: Diagnosis not present

## 2017-01-25 DIAGNOSIS — Z79899 Other long term (current) drug therapy: Secondary | ICD-10-CM | POA: Diagnosis not present

## 2017-01-25 DIAGNOSIS — R61 Generalized hyperhidrosis: Secondary | ICD-10-CM | POA: Insufficient documentation

## 2017-01-25 DIAGNOSIS — Z7901 Long term (current) use of anticoagulants: Secondary | ICD-10-CM | POA: Diagnosis not present

## 2017-01-25 DIAGNOSIS — I5022 Chronic systolic (congestive) heart failure: Secondary | ICD-10-CM | POA: Insufficient documentation

## 2017-01-25 DIAGNOSIS — G4730 Sleep apnea, unspecified: Secondary | ICD-10-CM | POA: Diagnosis not present

## 2017-01-25 DIAGNOSIS — Z951 Presence of aortocoronary bypass graft: Secondary | ICD-10-CM | POA: Insufficient documentation

## 2017-01-25 DIAGNOSIS — Z950 Presence of cardiac pacemaker: Secondary | ICD-10-CM | POA: Insufficient documentation

## 2017-01-25 DIAGNOSIS — Z87891 Personal history of nicotine dependence: Secondary | ICD-10-CM | POA: Diagnosis not present

## 2017-01-25 DIAGNOSIS — J189 Pneumonia, unspecified organism: Secondary | ICD-10-CM | POA: Diagnosis not present

## 2017-01-25 DIAGNOSIS — I25810 Atherosclerosis of coronary artery bypass graft(s) without angina pectoris: Secondary | ICD-10-CM | POA: Insufficient documentation

## 2017-01-25 DIAGNOSIS — Z96653 Presence of artificial knee joint, bilateral: Secondary | ICD-10-CM | POA: Diagnosis not present

## 2017-01-25 LAB — COMPREHENSIVE METABOLIC PANEL
ALK PHOS: 107 U/L (ref 38–126)
ALT: 93 U/L — AB (ref 17–63)
AST: 131 U/L — AB (ref 15–41)
Albumin: 3.9 g/dL (ref 3.5–5.0)
Anion gap: 12 (ref 5–15)
BUN: 12 mg/dL (ref 6–20)
CALCIUM: 9.3 mg/dL (ref 8.9–10.3)
CHLORIDE: 98 mmol/L — AB (ref 101–111)
CO2: 31 mmol/L (ref 22–32)
Creatinine, Ser: 0.94 mg/dL (ref 0.61–1.24)
GFR calc non Af Amer: >60 mL/min (ref >60)
GLUCOSE: 118 mg/dL — AB (ref 65–99)
Potassium: 3.9 mmol/L (ref 3.5–5.1)
SODIUM: 141 mmol/L (ref 135–145)
Total Bilirubin: 0.8 mg/dL (ref 0.3–1.2)
Total Protein: 7.1 g/dL (ref 6.5–8.1)

## 2017-01-25 LAB — CBC
HCT: 37.3 % — ABNORMAL LOW (ref 39.0–52.0)
Hemoglobin: 12.7 g/dL — ABNORMAL LOW (ref 13.0–17.0)
MCH: 32.6 pg (ref 26.0–34.0)
MCHC: 34 g/dL (ref 30.0–36.0)
MCV: 95.6 fL (ref 78.0–100.0)
PLATELETS: 162 10*3/uL (ref 150–400)
RBC: 3.9 MIL/uL — AB (ref 4.22–5.81)
RDW: 13.9 % (ref 11.5–15.5)
WBC: 7.2 10*3/uL (ref 4.0–10.5)

## 2017-01-25 LAB — LIPASE, BLOOD: LIPASE: 38 U/L (ref 11–51)

## 2017-01-25 LAB — URINALYSIS, ROUTINE W REFLEX MICROSCOPIC
BILIRUBIN URINE: NEGATIVE
GLUCOSE, UA: NEGATIVE mg/dL
Hgb urine dipstick: NEGATIVE
KETONES UR: NEGATIVE mg/dL
Leukocytes, UA: NEGATIVE
Nitrite: NEGATIVE
Protein, ur: NEGATIVE mg/dL
Specific Gravity, Urine: 1.006 (ref 1.005–1.030)
pH: 5 (ref 5.0–8.0)

## 2017-01-25 LAB — TSH: TSH: 2.469 u[IU]/mL (ref 0.350–4.500)

## 2017-01-25 LAB — PROTIME-INR
INR: 3.92
PROTHROMBIN TIME: 39.4 s — AB (ref 11.4–15.2)

## 2017-01-25 LAB — T4, FREE: FREE T4: 1.35 ng/dL — AB (ref 0.61–1.12)

## 2017-01-25 NOTE — ED Triage Notes (Signed)
Pt has had persistent abdominal pain and is scheduled to see GI doc this week. He reports he was also being treated by his PCP for pneumonia with abx. His main complaint today is that he has had drenching night sweats X1 week. Pt alert and oriented, no acute distress noted.

## 2017-01-25 NOTE — ED Provider Notes (Signed)
[facility] DEPT Provider Note   CSN: [id number] Arrival date & time: 01/25/17  1403     History   Chief Complaint Chief Complaint  Patient presents with  . Night Sweats    HPI Adam [name] is a 83 y.o. male.  HPI  Patient is a 83 year old male past medical history significant for CAD, AAA, heart failure, A. fib, pacemaker, but visits the emergency department with a two-week history of night sweats associated with generalized fatigue and weakness. Recently had a CT abdomen and pelvis to evaluate for stability of his AAA, incidentally found an adrenal mass that was concerning for neoplasm. Patient has follow-up appointment with general surgery in 2 weeks. Denies fever, cough, congestion, abdominal pain, nausea, vomiting, dysuria, headache. No altered mental status. No falls or head trauma. Recently started on Synthroid, called his primary care physician and was told to hold this medication since it might be the etiology of his night sweats.    Past Medical History:  Diagnosis Date  . AAA (abdominal aortic aneurysm) ([place])   . Anginal pain ([place])   . Arthritis   . CAD (coronary artery disease)    [name]  . CHF (congestive heart failure) ([place])   . Chronic anticoagulation   . Diverticulosis   . GERD (gastroesophageal reflux disease)   . Hyperlipidemia   . Hypertension   . LBP (low back pain)   . Permanent atrial fibrillation ([place])    decision made at [facility]  . Presence of permanent cardiac pacemaker   . S/P CABG (coronary artery bypass graft)   . Shortness of breath dyspnea   . Sleep apnea     Patient Active Problem List   Diagnosis Date Noted  . Mass of both adrenal glands ([place]) 12/25/2016  . Right inguinal hernia 12/03/2014  . Sleep apnea 06/05/2014  . Chronic systolic heart failure ([place]) 02/21/2014  . Long term current use of anticoagulant therapy 11/21/2013  . Encounter for therapeutic drug monitoring 10/06/2013  . S/P AAA (abdominal aortic  aneurysm) repair 12/11/2011  . HYPERTENSION, HEART CONTROLLED W/O ASSOC CHF 10/08/2010  . CORONARY ATHEROSCLEROSIS, ARTERY BYPASS GRAFT 10/08/2010  . Chronic atrial fibrillation ([place]) 10/08/2010  . PACEMAKER, PERMANENT-[employer] 10/08/2010    Past Surgical History:  Procedure Laterality Date  . CARDIOVERSION  06/30/2012   Procedure: CARDIOVERSION;  Surgeon: Sinclair Grooms, MD;  Location: [facility] ENDOSCOPY;  Service: Cardiovascular;  Laterality: N/A;  . CARDIOVERSION N/A 11/23/2013   Procedure: CARDIOVERSION;  Surgeon: Sinclair Grooms, MD;  Location: [place];  Service: Cardiovascular;  Laterality: N/A;  . CATARACT EXTRACTION W/ INTRAOCULAR LENS  IMPLANT, BILATERAL    . CORONARY ARTERY BYPASS GRAFT  Feb 1989   "CABG X6"  . ENDOVASCULAR STENT INSERTION  07/28/2011   Procedure: ENDOVASCULAR STENT GRAFT INSERTION;  Surgeon: Hinda Lenis, MD;  Location: [facility];  Service: Vascular;  Laterality: N/A;  Pt. on Coumadin/ instructed to hold 5 days prior to procedure  . EYE SURGERY    . INGUINAL HERNIA REPAIR Right 01/04/2015  . INGUINAL HERNIA REPAIR Right 01/04/2015   Procedure:  OPEN REPAIR RIGHT INGUNIAL HERNIA;  Surgeon: Fanny Skates, MD;  Location: [place];  Service: General;  Laterality: Right;  . INSERT / REPLACE / REMOVE PACEMAKER  July 2008  . INSERTION OF MESH Right 01/04/2015   Procedure: INSERTION OF MESH;  Surgeon: Fanny Skates, MD;  Location: [place];  Service: General;  Laterality: Right;  . [place]  1996   Bilateral total knee replacements  . LEFT HEART CATHETERIZATION WITH CORONARY ANGIOGRAM N/A 03/28/2014   Procedure: LEFT HEART CATHETERIZATION WITH CORONARY ANGIOGRAM;  Surgeon: Sinclair Grooms, MD;  Location: [facility] CATH LAB;  Service: Cardiovascular;  Laterality: N/A;  . PERMANENT PACEMAKER GENERATOR CHANGE N/A 11/03/2013   Procedure: PERMANENT PACEMAKER GENERATOR CHANGE;  Surgeon: [name], MD;  Location: [facility] CATH LAB;  Service: Cardiovascular;   Laterality: N/A;  . TONSILLECTOMY         Home Medications    Prior to Admission medications   Medication Sig Start Date End Date Taking? Authorizing Provider  amoxicillin (AMOXIL) 500 MG capsule Take 1,000 mg by mouth daily. 01/20/17  Yes [provider]  atorvastatin (LIPITOR) 10 MG tablet Take 1 tablet (10 mg total) by mouth daily. Patient taking differently: Take 10 mg by mouth every evening.  11/08/15  Yes [name], MD  Cholecalciferol (VITAMIN D) 2000 UNITS CAPS Take 2,000 Units by mouth daily.   Yes [provider]  furosemide (LASIX) 40 MG tablet TAKE 1.5 TABLETS (60 MG TOTAL) BY MOUTH DAILY. Patient taking differently: Take 60 mg by mouth daily. TAKE 1.5 TABLETS (60 MG TOTAL) BY MOUTH DAILY. 06/18/16  Yes [name], MD  latanoprost (XALATAN) 0.005 % ophthalmic solution Place 1 drop into both eyes at bedtime.   Yes [provider]  Multiple Vitamins-Minerals (RA VISION-VITE PRESERVE PO) Take 1 tablet by mouth daily.    Yes [provider]  nitroGLYCERIN (NITROSTAT) 0.4 MG SL tablet Place 0.4 mg under the tongue every 5 (five) minutes as needed. For chest pain   Yes [provider]  Omega-3 Fatty Acids (FISH OIL) 1000 MG CPDR Take 1 capsule by mouth daily.    Yes [provider]  silodosin (RAPAFLO) 8 MG CAPS capsule Take 8 mg by mouth at bedtime.  04/17/14  Yes [provider]  warfarin (COUMADIN) 5 MG tablet TAKE AS DIRECTED PER CLINIC INSTRUCTIONS Patient taking differently: Take 5 mg by mouth daily at 6 PM. TAKE AS DIRECTED PER CLINIC INSTRUCTIONS 11/02/16  Yes [name], MD    Family History Family History  Problem Relation Age of Onset  . Stroke Mother   . Heart failure Mother   . Varicose Veins Mother   . Heart attack Father   . Heart disease Father        Before age 19    Heart Attack age 76 and 50  . Cancer Sister        breast  . Heart disease Sister        After age 64  . Other Sister         DJD    Social History Social History  Substance Use Topics  . Smoking status: Former Smoker    Years: 3.00    Types: Cigarettes    Quit date: 08/17/1948  . Smokeless tobacco: Never Used  . Alcohol use No     Allergies   Lisinopril; Morphine; and Amiodarone   Review of Systems Review of Systems  Constitutional: Positive for diaphoresis and fatigue. Negative for appetite change and fever.  HENT: Negative for congestion and trouble swallowing.   Eyes: Negative for visual disturbance.  Respiratory: Negative for cough, chest tightness and shortness of breath.   Cardiovascular: Negative for chest pain, palpitations and leg swelling.  Gastrointestinal: Negative for abdominal pain, blood in stool, diarrhea, nausea and vomiting.  Genitourinary: Negative for decreased urine volume,  dysuria and flank pain.  Musculoskeletal: Negative for back pain and gait problem.  Skin: Negative for rash.  Neurological: Positive for weakness ( Generalized). Negative for dizziness, seizures, speech difficulty, light-headedness and headaches.  Psychiatric/Behavioral: Negative for confusion.     Physical Exam Updated Vital Signs BP (!) 142/52   Pulse (!) 47   Temp 98 F (36.7 C) (Oral)   Resp 18   Ht 5\' 8"  (1.727 m)   Wt 72.6 kg (160 lb)   SpO2 96%   BMI 24.33 kg/m   Physical Exam  Constitutional: He is oriented to person, place, and time. He appears well-developed and well-nourished. No distress.  HENT:  Head: Atraumatic.  Mouth/Throat: Oropharynx is clear and moist.  Eyes: Conjunctivae and EOM are normal. Pupils are equal, round, and reactive to light.  Neck: Normal range of motion. No JVD present.  Cardiovascular: Regular rhythm, normal heart sounds and intact distal pulses.   Bradycardia  Pulmonary/Chest: Effort normal and breath sounds normal. No respiratory distress. He has no wheezes. He has no rales.  Pacemaker palpated to the left chest wall. No surrounding erythema. No  tenderness.  Abdominal: Soft. He exhibits no distension and no mass. There is no tenderness. There is no guarding.  Musculoskeletal: Normal range of motion. He exhibits no edema.  Neurological: He is alert and oriented to person, place, and time. He has normal strength and normal reflexes. No cranial nerve deficit or sensory deficit. Gait normal. GCS eye subscore is 4. GCS verbal subscore is 5. GCS motor subscore is 6.  Skin: Skin is warm.  Psychiatric: He has a normal mood and affect.     ED Treatments / Results  Labs (all labs ordered are listed, but only abnormal results are displayed) Labs Reviewed  COMPREHENSIVE METABOLIC PANEL - Abnormal; Notable for the following:       Result Value   Chloride 98 (*)    Glucose, Bld 118 (*)    AST 131 (*)    ALT 93 (*)    All other components within normal limits  CBC - Abnormal; Notable for the following:    RBC 3.90 (*)    Hemoglobin 12.7 (*)    HCT 37.3 (*)    All other components within normal limits  PROTIME-INR - Abnormal; Notable for the following:    Prothrombin Time 39.4 (*)    All other components within normal limits  T4, FREE - Abnormal; Notable for the following:    Free T4 1.35 (*)    All other components within normal limits  LIPASE, BLOOD  URINALYSIS, ROUTINE W REFLEX MICROSCOPIC  TSH    EKG  EKG Interpretation  Date/Time:  Monday January 25 2017 19:13:53 EDT Ventricular Rate:  58 PR Interval:    QRS Duration: 105 QT Interval:  477 QTC Calculation: 469 R Axis:   41 Text Interpretation:  Atrial fibrillation Nonspecific T abnormalities, anterior leads Baseline wander in lead(s) I III aVL Confirmed by Hazle Coca [phone]) on 01/25/2017 7:18:42 PM       Radiology Dg Chest 2 View  Result Date: 01/25/2017 CLINICAL DATA:  Pneumonia. EXAM: CHEST  2 VIEW COMPARISON:  11/14/2013. FINDINGS: AP and lateral views of the chest show no focal airspace consolidation. No pulmonary edema or pleural effusion. The cardio pericardial  silhouette is enlarged. Patient is status post CABG. Right-sided permanent pacemaker again noted. Telemetry leads overlie the chest. IMPRESSION: Stable.  No acute cardiopulmonary findings. Electronically Signed   By: Verda Cumins.D.  On: 01/25/2017 19:12   Ct Head Wo Contrast  Result Date: 01/25/2017 CLINICAL DATA:  Recent fall, anti coagulation. Night sweats for 1 week. EXAM: CT HEAD WITHOUT CONTRAST TECHNIQUE: Contiguous axial images were obtained from the base of the skull through the vertex without intravenous contrast. COMPARISON:  None. FINDINGS: Brain: The brainstem, cerebellum, cerebral peduncles, thalami, basal ganglia, basilar cisterns, and ventricular system appear within normal limits. Periventricular white matter and corona radiata hypodensities favor chronic ischemic microvascular white matter disease. No intracranial hemorrhage, mass lesion, or acute CVA. Vascular: There is atherosclerotic calcification of the cavernous carotid arteries bilaterally. Skull: Unremarkable Sinuses/Orbits: Minimal chronic ethmoid sinusitis. Other: No supplemental non-categorized findings. IMPRESSION: 1. No acute intracranial abnormality is observed. 2. Periventricular white matter and corona radiata hypodensities favor chronic ischemic microvascular white matter disease. 3. Minimal chronic ethmoid sinusitis. Electronically Signed   By: Van Clines M.D.   On: 01/25/2017 17:52    Procedures Procedures (including critical care time)  Medications Ordered in ED Medications - No data to display   Initial Impression / Assessment and Plan / ED Course  I have reviewed the triage vital signs and the nursing notes.  Pertinent labs & imaging results that were available during my care of the patient were reviewed by me and considered in my medical decision making (see chart for details).     Patient is a 83 year old male with past medical history significant for A. fib, pacemaker in place, who presents  to the emergency department with a 2 week history of progressively worsening fatigue/generalized weakness and night sweats. No fever. On arrival bradycardic, rate 40-50s, normotensive. Nonfocal neurologic exam. In the waiting in the room without difficulties.  Lab work showed no significant electrolyte abnormalities. Creatinine at baseline. No leukocytosis. UA shows no signs of infectious process. Chest x-ray showed no signs of pneumonia. Hemoglobin 12.7. INR 3.92. TSH 2.4, free T4 1.35.   Patient without signs or symptoms of obvious infectious process. No signs of anemia. Patient's pacemaker was interrogated, showed no events, working properly. Patient's pacemaker is set to pace at 3. Patient without symptoms of ACS, no chest pain. Concerned of the patient's night sweats or secondary to an undiagnosed malignancy, most likely adrenal neoplasm.  Do not feel that the patient needs admission at this time. Patient has scheduled outpatient follow-up for further workup of cancer. Patient has a follow-up appointment with his cardiologist in 2 days, has a follow-up appointment with his primary care physician. Instructed her precautions to the emergency department. Patient expressed understanding, no questions or concerns at time of discharge.  Final Clinical Impressions(s) / ED Diagnoses   Final diagnoses:  Night sweats    New Prescriptions Discharge Medication List as of 01/25/2017  8:11 PM       Nathaniel Man, MD 01/26/17 0105    Quintella Reichert, MD 01/31/17 [phone]

## 2017-01-25 NOTE — ED Notes (Signed)
Pt ambulated to RR with steady gait, pt refusing assistance.

## 2017-01-25 NOTE — Discharge Instructions (Signed)
Hold your nighttime dose of Coumadin. Call your primary care physician in the morning to discuss when to restart your Coumadin. Your INR was 3.95.

## 2017-01-25 NOTE — ED Notes (Signed)
Pt back from CT

## 2017-01-25 NOTE — ED Notes (Signed)
[employer] spoke with MD about pace maker interrogation. Paced at 40. Pt made aware.

## 2017-01-25 NOTE — ED Notes (Signed)
Pt verbalized understanding discharge instructions and denies any further needs or questions at this time. VS stable, ambulatory and steady gait.   

## 2017-01-25 NOTE — ED Notes (Signed)
EKG given to MD Ralene Bathe

## 2017-01-25 NOTE — ED Notes (Signed)
Pt taken to CT.

## 2017-01-25 NOTE — ED Notes (Signed)
Pt ambulated to restroom independently.

## 2017-01-25 NOTE — ED Notes (Signed)
Pt taken to xray 

## 2017-01-26 ENCOUNTER — Telehealth: Payer: Self-pay | Admitting: Interventional Cardiology

## 2017-01-26 NOTE — Telephone Encounter (Signed)
New message      Pt went to ER yesterday with night sweats and fever.  His coumadin was elevated (3.9) and his medications were adjusted.  Pt was advised to call Dr Thompson Caul office for further instructions

## 2017-01-26 NOTE — Telephone Encounter (Signed)
Spoke with wife and moved Coumadin appt back a day to match appt with [name]. She states pt was told by ED to skip his Coumadin last night since INR was 3.92. Agree with this, advised pt to resume his usual dose today. Recheck INR on 6/14.

## 2017-01-26 NOTE — Telephone Encounter (Signed)
Spoke with pt wife, in the ER they were concerned about his medication changes and about the function of his pacemaker. They would like a sooner appointment with [name]. Follow up scheduled Thursday this week. Will send the device clinic a note to make sure the patient is coming in for that appt so he can be checked while he is here. They want to know if he can wait until Thursday for his coumadin check, right now his appt is Wednesday. His INR was elevated in the ER. Will forward to the [facility] clinic for them to contact the patient regarding warfarin.

## 2017-01-27 NOTE — Progress Notes (Signed)
Cardiology Office Note    Date:  01/28/2017   ID:  Adam [name], DOB 1925/08/11, [name] [id number]  PCP:  [name], MD  Cardiologist: Sinclair Grooms, MD   Chief Complaint  Patient presents with  . Atrial Fibrillation  . Hospitalization Follow-up  . Fever    Associated with night sweats    History of Present Illness:  Adam [name] is a 83 y.o. male with cardiomyopathy, chronic systolic heart failure, chronic atrial fibrillation, CAD with bypass graft failure (saphenous vein graft to the obtuse marginal), hypertension, chronic anticoagulation therapy, hypertension, and hyperlipidemia. Pacemaker set at rate of 40 bpm to minimize right ventricular pacing in the setting of LV dysfunction.  Recently seen by Truitt Merle, NP-C and noted to be bradycardic into the 40s. TSH was mildly elevated and Levoxyl was started. Since starting Levoxyl he noted night sweats and chills that would awaken him. He went to the emergency room with one episode and had a temperature of 100.5. This has occurred intermittently over nearly 2 weeks until the last 2 days. They stopped using Levoxyl 4 days ago. They feel Levoxyl could've had something to do with the sweats and fever.  He has been seen in the device clinic. Telemetry data has been reviewed retrograde for multiple months. There is essentially no sustained pacing rates at 40 bpm. The slow heart rate during the office visit must have been one of very few brief occurrences of bradycardia and right ventricular pacing.  He is concerned about bruising from warfarin therapy. He is concerned about being taken off of lisinopril and beta blocker therapy. Beta blocker therapy was discontinued because of concern for bradycardia and pacing. Lisinopril was discontinued because of kidney impairment and hypokalemic.  He denies angina. There is no orthopnea or lower extremity swelling.    Past Medical History:  Diagnosis Date  . AAA (abdominal aortic  aneurysm) ([place])   . Anginal pain ([place])   . Arthritis   . CAD (coronary artery disease)    [name] IS CARDIO  . CHF (congestive heart failure) ([place])   . Chronic anticoagulation   . Diverticulosis   . GERD (gastroesophageal reflux disease)   . Hyperlipidemia   . Hypertension   . LBP (low back pain)   . Permanent atrial fibrillation ([place])    decision made at [facility]  . Presence of permanent cardiac pacemaker   . S/P CABG (coronary artery bypass graft)   . Shortness of breath dyspnea   . Sleep apnea     Past Surgical History:  Procedure Laterality Date  . CARDIOVERSION  06/30/2012   Procedure: CARDIOVERSION;  Surgeon: Sinclair Grooms, MD;  Location: [facility] END[facility];  Service: Cardiovascular;  Laterality: N/A;  . CARDIOVERSION N/A 11/23/2013   Procedure: CARDIOVERSION;  Surgeon: Sinclair Grooms, MD;  Location: [place];  Service: Cardiovascular;  Laterality: N/A;  . CATARACT EXTRACTION W/ INTRAOCULAR LENS  IMPLANT, BILATERAL    . CORONARY ARTERY BYPASS GRAFT  Feb 1989   "CABG X6"  . ENDOVASCULAR STENT INSERTION  07/28/2011   Procedure: ENDOVASCULAR STENT GRAFT INSERTION;  Surgeon: Hinda Lenis, MD;  Location: [place];  Service: Vascular;  Laterality: N/A;  Pt. on Coumadin/ instructed to hold 5 days prior to procedure  . EYE SURGERY    . INGUINAL HERNIA REPAIR Right 01/04/2015  . INGUINAL HERNIA REPAIR Right 01/04/2015   Procedure:  OPEN REPAIR RIGHT INGUNIAL HERNIA;  Surgeon: Fanny Skates, MD;  Location: [facility] OR;  Service: General;  Laterality: Right;  . INSERT / REPLACE / REMOVE PACEMAKER  July 2008  . INSERTION OF MESH Right 01/04/2015   Procedure: INSERTION OF MESH;  Surgeon: Fanny Skates, MD;  Location: [place];  Service: General;  Laterality: Right;  . JOINT REPLACEMENT  1995 & 1996   Bilateral total knee replacements  . LEFT HEART CATHETERIZATION WITH CORONARY ANGIOGRAM N/A 03/28/2014   Procedure: LEFT HEART CATHETERIZATION WITH CORONARY ANGIOGRAM;  Surgeon: Sinclair Grooms, MD;  Location[facility] CATH LAB;  Service: Cardiovascular;  Laterality: N/A;  . PERMANENT PACEMAKER GENERATOR CHANGE N/A 11/03/2013   Procedure: PERMANENT PACEMAKER GENERATOR CHANGE;  Surgeon: [name], MD;  Location: [facility] CATH LAB;  Service: Cardiovascular;  Laterality: N/A;  . TONSILLECTOMY      Current Medications: Outpatient Medications Prior to Visit  Medication Sig Dispense Refill  . atorvastatin (LIPITOR) 10 MG tablet Take 1 tablet (10 mg total) by mouth daily. (Patient taking differently: Take 10 mg by mouth every evening. ) 90 tablet 2  . Cholecalciferol (VITAMIN D) 2000 UNITS CAPS Take 2,000 Units by mouth daily.    . furosemide (LASIX) 40 MG tablet TAKE 1.5 TABLETS (60 MG TOTAL) BY MOUTH DAILY. (Patient taking differently: Take 60 mg by mouth daily. TAKE 1.5 TABLETS (60 MG TOTAL) BY MOUTH DAILY.) 135 tablet 3  . latanoprost (XALATAN) 0.005 % ophthalmic solution Place 1 drop into both eyes at bedtime.    . Multiple Vitamins-Minerals (RA VISION-VITE PRESERVE PO) Take 1 tablet by mouth daily.     . nitroGLYCERIN (NITROSTAT) 0.4 MG SL tablet Place 0.4 mg under the tongue every 5 (five) minutes as needed. For chest pain    . Omega-3 Fatty Acids (FISH OIL) 1000 MG CPDR Take 1 capsule by mouth daily.     . silodosin (RAPAFLO) 8 MG CAPS capsule Take 8 mg by mouth at bedtime.     [name] warfarin (COUMADIN) 5 MG tablet TAKE AS DIRECTED PER CLINIC INSTRUCTIONS (Patient taking differently: Take 5 mg by mouth daily at 6 PM. TAKE AS DIRECTED PER CLINIC INSTRUCTIONS) 90 tablet 0  . amoxicillin (AMOXIL) 500 MG capsule Take 1,000 mg by mouth daily.     No facility-administered medications prior to visit.      Allergies:   Lisinopril; Morphine; and Amiodarone   Social History   Social History  . Marital status: Married    Spouse name: N/A  . Number of children: N/A  . Years of education: N/A   Social History Main Topics  . Smoking status: Former Smoker    Years: 3.00    Types:  Cigarettes    Quit date: 08/17/1948  . Smokeless tobacco: Never Used  . Alcohol use No  . Drug use: No  . Sexual activity: Not Asked   Other Topics Concern  . None   Social History Narrative  . None     Family History:  The patient's family history includes Cancer in his sister; Heart attack in his father; Heart disease in his father and sister; Heart failure in his mother; Other in his sister; Stroke in his mother; Varicose Veins in his mother.   ROS:   Please see the history of present illness.    Progressive weakness, easy bruising, decreased hearing, difficulty with walking and balance. Recently turned 83 years of age but missed his birthday celebration in the emergency room being evaluated for chills and fever.  An abdominal CT scan performed by Dr.  Chen evaluating aortic stent graft demonstrated a new adrenal mass that developed since the last CT scan. He will be seeing Dr. Delana Meyer for evaluation of this potential endocrine tumor.  All other systems reviewed and are negative.   PHYSICAL EXAM:   VS:  BP (!) 110/54 (BP Location: Left Arm)   Pulse 62   Ht 5\' 8"  (1.727 m)   Wt 156 lb 12.8 oz (71.1 kg)   BMI 23.84 kg/m    GEN: Well nourished, well developed, in no acute distress . Multiple large ecchymoses on his extremities. HEENT: normal  Neck: no JVD, carotid bruits, or masses Cardiac: IRR; no murmurs, rubs, or gallops. Trace bilateral lower extremity edema. Respiratory:  clear to auscultation bilaterally, normal work of breathing GI: soft, nontender, nondistended, + BS MS: no deformity or atrophy  Skin: warm and dry, no rash Neuro:  Alert and Oriented x 3, Strength and sensation are intact Psych: euthymic mood, full affect  Wt Readings from Last 3 Encounters:  01/28/17 156 lb 12.8 oz (71.1 kg)  01/25/17 160 lb (72.6 kg)  12/25/16 160 lb (72.6 kg)      Studies/Labs Reviewed:   EKG:  EKG  Not repeated and the most recent tracing of 01/25/2017 was personally reviewed  and revealed atrial fibrillation with rate of 68 bpm, nonspecific T wave abnormality, tall R-wave in V1, and no significant change from prior 16/10/2016. On the 12/07/2016 tracing, the heart rate was in the low 40s with occasional ventricular pacing.  Recent Labs: 04/07/2016: Brain Natriuretic Peptide 162.8 01/25/2017: ALT 93; BUN 12; Creatinine, Ser 0.94; Hemoglobin 12.7; Platelets 162; Potassium 3.9; Sodium 141; TSH 2.469   Lipid Panel    Component Value Date/Time   CHOL 141 12/07/2016 1113   TRIG 59 12/07/2016 1113   HDL 56 12/07/2016 1113   CHOLHDL 2.5 12/07/2016 1113   LDLCALC 73 12/07/2016 1113    Additional studies/ records that were reviewed today include:  TSH 12/22/2016 was 6.72, leading to therapy with Levoxyl.  Free T4 was 1.35 and TSH 2.47 on 01/25/17.    ASSESSMENT:    1. Chronic atrial fibrillation ([place])   2. Fever and chills   3. Chronic systolic heart failure ([facility])   4. Elevated TSH   5. Atherosclerosis of coronary artery bypass graft of native heart with other forms of angina pectoris ([place])   6. Long term current use of anticoagulant therapy   7. PACEMAKER, PERMANENT-[employer]   8. Encounter for therapeutic drug monitoring      PLAN:  In order of problems listed above:  1. Chronic atrial fibrillation with controlled ventricular response and previous documentation of minimal pacing at the basal rate of 40 bpm after extensive review of pacemaker telemetry data. Beta blocker therapy has been discontinued. Will not reinstitute. This does compromise his heart failure therapy regimen. 2. 2 sets of blood cultures will be obtained to rule out the possibility of low-grade bacteremia related he is a to his pacemaker device with smoldering infection or possibly stent graft infection. Etiology is uncertain. The finding of a new adrenal mass could represent an underlying mitotic process. He will be seeing Dr. Armandina Gemma for evaluation. 3. Ischemic cardiomyopathy now  currently only on diuretic therapy. Beta blocker and ace therapy of been discontinued recently. Mostly having to do with low blood pressure. 4. No symptoms to suggest angina 5. Extensive ecchymoses with Coumadin. We discussed NOAC therapy but after understanding that bruising was still be present, he chose against this. 6.  The device clinic [facility] Long/Dr. Caryl Comes reviewed previous telemetry and can document no significant/prolonged pacing at 40 bpm which is a basal rate set on the device. Decreased energy is not related to bradycardia. 7. Coumadin clinic follow-up as scheduled.  Overall plan for the time being is to stay off of Levoxyl (I don't believe this had anything to do with his night sweats), meet with Dr. Harlow Asa concerning the adrenal mass, blood cultures to rule out smoldering bacteremia, and close clinical follow-up. Will meet with Truitt Merle again in one month.  Prolonged office visit, with greater than 50% of the time spent in face-to-face counseling and diagnostic planning discussions with the patient and his wife.   Medication Adjustments/Labs and Tests Ordered: Current medicines are reviewed at length with the patient today.  Concerns regarding medicines are outlined above.  Medication changes, Labs and Tests ordered today are listed in the Patient Instructions below. Patient Instructions  Medication Instructions:  None  Labwork: Blood cultures today  Testing/Procedures: None  Follow-Up: Keep appointment with Truitt Merle, NP in July.  Your physician wants you to follow-up in: 6 months with Dr. [name].  You will receive a reminder letter in the mail two months in advance. If you don't receive a letter, please call our office to schedule the follow-up appointment.   Any Other Special Instructions Will Be Listed Below (If Applicable).     If you need a refill on your cardiac medications before your next appointment, please call your pharmacy.      Signed, Sinclair Grooms, MD  01/28/2017 12:03 PM    [address], [place], [place]  [address] Phone: [phone]; Fax: [phone]

## 2017-01-28 ENCOUNTER — Ambulatory Visit (INDEPENDENT_AMBULATORY_CARE_PROVIDER_SITE_OTHER): Payer: Medicare Other | Admitting: Interventional Cardiology

## 2017-01-28 ENCOUNTER — Other Ambulatory Visit: Payer: Self-pay | Admitting: Interventional Cardiology

## 2017-01-28 ENCOUNTER — Ambulatory Visit (INDEPENDENT_AMBULATORY_CARE_PROVIDER_SITE_OTHER): Payer: Medicare Other | Admitting: *Deleted

## 2017-01-28 ENCOUNTER — Encounter: Payer: Self-pay | Admitting: Interventional Cardiology

## 2017-01-28 VITALS — BP 110/54 | HR 62 | Ht 68.0 in | Wt 156.8 lb

## 2017-01-28 DIAGNOSIS — Z950 Presence of cardiac pacemaker: Secondary | ICD-10-CM

## 2017-01-28 DIAGNOSIS — Z7901 Long term (current) use of anticoagulants: Secondary | ICD-10-CM | POA: Diagnosis not present

## 2017-01-28 DIAGNOSIS — I25708 Atherosclerosis of coronary artery bypass graft(s), unspecified, with other forms of angina pectoris: Secondary | ICD-10-CM | POA: Diagnosis not present

## 2017-01-28 DIAGNOSIS — I482 Chronic atrial fibrillation, unspecified: Secondary | ICD-10-CM

## 2017-01-28 DIAGNOSIS — Z5181 Encounter for therapeutic drug level monitoring: Secondary | ICD-10-CM

## 2017-01-28 DIAGNOSIS — I255 Ischemic cardiomyopathy: Secondary | ICD-10-CM | POA: Diagnosis not present

## 2017-01-28 DIAGNOSIS — I5022 Chronic systolic (congestive) heart failure: Secondary | ICD-10-CM | POA: Diagnosis not present

## 2017-01-28 DIAGNOSIS — I4891 Unspecified atrial fibrillation: Secondary | ICD-10-CM | POA: Diagnosis not present

## 2017-01-28 DIAGNOSIS — R509 Fever, unspecified: Secondary | ICD-10-CM | POA: Diagnosis not present

## 2017-01-28 DIAGNOSIS — E279 Disorder of adrenal gland, unspecified: Secondary | ICD-10-CM | POA: Diagnosis not present

## 2017-01-28 DIAGNOSIS — R946 Abnormal results of thyroid function studies: Secondary | ICD-10-CM

## 2017-01-28 DIAGNOSIS — R7989 Other specified abnormal findings of blood chemistry: Secondary | ICD-10-CM

## 2017-01-28 DIAGNOSIS — I209 Angina pectoris, unspecified: Secondary | ICD-10-CM

## 2017-01-28 LAB — POCT INR: INR: 4.8

## 2017-01-28 NOTE — Patient Instructions (Signed)
Medication Instructions:  None  Labwork: Blood cultures today  Testing/Procedures: None  Follow-Up: Keep appointment with Truitt Merle, NP in July.  Your physician wants you to follow-up in: 6 months with Dr. [name].  You will receive a reminder letter in the mail two months in advance. If you don't receive a letter, please call our office to schedule the follow-up appointment.   Any Other Special Instructions Will Be Listed Below (If Applicable).     If you need a refill on your cardiac medications before your next appointment, please call your pharmacy.

## 2017-02-02 ENCOUNTER — Telehealth: Payer: Self-pay | Admitting: Interventional Cardiology

## 2017-02-02 NOTE — Telephone Encounter (Signed)
Spoke with wife, DPR on file. She wanted to see about getting a referral from Dr. [name] for pt to go to Endocrinology.  Pt has an appt in August with Endo from a referral that was sent by PCP.  Advised I would have to send message to Dr. [name] for referral but he is on vacation this week so may be a little bit before I hear back.  Also made her aware a lot of insurances require that referrals come from PCP and won't accept them from other specialists.  Gave her the name of Dr. Jeanann Lewandowsky for Endo and the phone number to their office.  Advised her to contact them and see when the earliest was pt could get in there and if they require a referral.  Also advised if they do, ask Dr. Harrington Challenger' office if they would send the referral.  Wife verbalized understanding and was appreciative for assistance.

## 2017-02-02 NOTE — Telephone Encounter (Signed)
New message  Pt wife call requesting to speak with RN about getting a recommendation for an Endocrinologist. Pt wife states pt has an appt made, but feels its an urgent matter and he needs a sooner appt. Please call back to discuss

## 2017-02-04 ENCOUNTER — Other Ambulatory Visit: Payer: Self-pay | Admitting: Interventional Cardiology

## 2017-02-04 LAB — CULTURE, BLOOD (SINGLE)

## 2017-02-08 ENCOUNTER — Ambulatory Visit (INDEPENDENT_AMBULATORY_CARE_PROVIDER_SITE_OTHER): Payer: Medicare Other | Admitting: *Deleted

## 2017-02-08 DIAGNOSIS — I482 Chronic atrial fibrillation, unspecified: Secondary | ICD-10-CM

## 2017-02-08 DIAGNOSIS — I4891 Unspecified atrial fibrillation: Secondary | ICD-10-CM

## 2017-02-08 DIAGNOSIS — Z5181 Encounter for therapeutic drug level monitoring: Secondary | ICD-10-CM | POA: Diagnosis not present

## 2017-02-08 DIAGNOSIS — I255 Ischemic cardiomyopathy: Secondary | ICD-10-CM

## 2017-02-08 LAB — POCT INR: INR: 1.5

## 2017-02-09 DIAGNOSIS — D3500 Benign neoplasm of unspecified adrenal gland: Secondary | ICD-10-CM | POA: Diagnosis not present

## 2017-02-10 ENCOUNTER — Ambulatory Visit: Payer: Medicare Other | Admitting: Interventional Cardiology

## 2017-02-10 DIAGNOSIS — D3500 Benign neoplasm of unspecified adrenal gland: Secondary | ICD-10-CM | POA: Diagnosis not present

## 2017-02-11 DIAGNOSIS — D3500 Benign neoplasm of unspecified adrenal gland: Secondary | ICD-10-CM | POA: Diagnosis not present

## 2017-02-16 ENCOUNTER — Ambulatory Visit (INDEPENDENT_AMBULATORY_CARE_PROVIDER_SITE_OTHER): Payer: Medicare Other | Admitting: *Deleted

## 2017-02-16 DIAGNOSIS — Z5181 Encounter for therapeutic drug level monitoring: Secondary | ICD-10-CM | POA: Diagnosis not present

## 2017-02-16 DIAGNOSIS — I482 Chronic atrial fibrillation, unspecified: Secondary | ICD-10-CM

## 2017-02-16 DIAGNOSIS — I255 Ischemic cardiomyopathy: Secondary | ICD-10-CM

## 2017-02-16 DIAGNOSIS — I4891 Unspecified atrial fibrillation: Secondary | ICD-10-CM | POA: Diagnosis not present

## 2017-02-16 LAB — POCT INR: INR: 1.9

## 2017-02-23 DIAGNOSIS — D3500 Benign neoplasm of unspecified adrenal gland: Secondary | ICD-10-CM | POA: Diagnosis not present

## 2017-03-02 ENCOUNTER — Ambulatory Visit (INDEPENDENT_AMBULATORY_CARE_PROVIDER_SITE_OTHER): Payer: Medicare Other | Admitting: Nurse Practitioner

## 2017-03-02 ENCOUNTER — Encounter (INDEPENDENT_AMBULATORY_CARE_PROVIDER_SITE_OTHER): Payer: Self-pay

## 2017-03-02 ENCOUNTER — Encounter: Payer: Self-pay | Admitting: Nurse Practitioner

## 2017-03-02 ENCOUNTER — Ambulatory Visit (INDEPENDENT_AMBULATORY_CARE_PROVIDER_SITE_OTHER): Payer: Medicare Other | Admitting: *Deleted

## 2017-03-02 ENCOUNTER — Telehealth: Payer: Self-pay | Admitting: Nurse Practitioner

## 2017-03-02 ENCOUNTER — Other Ambulatory Visit: Payer: Self-pay | Admitting: Internal Medicine

## 2017-03-02 VITALS — BP 128/68 | HR 54 | Ht 68.0 in | Wt 158.8 lb

## 2017-03-02 DIAGNOSIS — Z950 Presence of cardiac pacemaker: Secondary | ICD-10-CM | POA: Diagnosis not present

## 2017-03-02 DIAGNOSIS — I482 Chronic atrial fibrillation, unspecified: Secondary | ICD-10-CM

## 2017-03-02 DIAGNOSIS — I255 Ischemic cardiomyopathy: Secondary | ICD-10-CM

## 2017-03-02 DIAGNOSIS — I4891 Unspecified atrial fibrillation: Secondary | ICD-10-CM

## 2017-03-02 DIAGNOSIS — I2589 Other forms of chronic ischemic heart disease: Secondary | ICD-10-CM

## 2017-03-02 DIAGNOSIS — E278 Other specified disorders of adrenal gland: Secondary | ICD-10-CM

## 2017-03-02 DIAGNOSIS — I5022 Chronic systolic (congestive) heart failure: Secondary | ICD-10-CM | POA: Diagnosis not present

## 2017-03-02 DIAGNOSIS — Z5181 Encounter for therapeutic drug level monitoring: Secondary | ICD-10-CM | POA: Diagnosis not present

## 2017-03-02 LAB — POCT INR: INR: 2.3

## 2017-03-02 NOTE — Patient Instructions (Addendum)
We will be checking the following labs today - NONE   Medication Instructions:    Continue with your current medicines.     Testing/Procedures To Be Arranged:  N/A  Follow-Up:   See me or Dr. [name] in 3 to 4 months.    Other Special Instructions:   Let the coumadin staff know the date of your biopsy     If you need a refill on your cardiac medications before your next appointment, please call your pharmacy.   Call the [place] office at [phone] if you have any questions, problems or concerns.

## 2017-03-02 NOTE — Telephone Encounter (Signed)
° °  [facility] Pre-operative Risk Assessment    Request for surgical clearance:  1. What type of surgery is being performed? biopsy  2. When is this surgery scheduled? Not scheduled yet   3. Are there any medications that need to be held prior to surgery and how long? Would like to know if patient can hold warfarin for 4 days prior?  4. Name of physician performing surgery? Endocrinologist   5. What is your office phone and fax number? [phone] (Radiology at [facility])  Marjean Donna 03/02/2017, 11:25 AM  _________________________________________________________________   (provider comments below)

## 2017-03-02 NOTE — Telephone Encounter (Signed)
Reviewed chart:  Patient has CHADS2 score of 3 (age, hypertension, heart failure).  Per office protocol okay to hold warfarin for 4 days prior to biopsy.    Patient spouse notified that patient okay to hold x 4 days.  Will fax this to Radiology at [facility]

## 2017-03-02 NOTE — Progress Notes (Signed)
CARDIOLOGY OFFICE NOTE  Date:  03/02/2017    Adam [name] Date of Birth: [birth year] Medical Record #[id number]  PCP:  [name], MD  Cardiologist:  Jennings Books   Chief Complaint  Patient presents with  . Congestive Heart Failure    Follow up visit - seen for Dr. [name]    History of Present Illness: Adam [name] is a 83 y.o. male who presents today for a one month check. Seen for Dr. [name].   He has a history of ischemic cardiomyopathy, chronic systolic heart failure, chronic atrial fibrillation, CAD with bypass graft failure (saphenous vein graft to the obtuse marginal), hypertension, chronic anticoagulation therapy, hypertension, and hyperlipidemia. EF is 35% per echo from 03/2016. He has a pacemaker in place.   Noted that his pacemaker is set at VVI 40 - to minimize RV pacing in the setting of LV dysfunction.   I saw him back in April - he was quite bradycardic with rates in the 40s. TSH elevated and replacement therapy was started - then noted night sweats and chills - to the point he actually went to the ER with one episode and had fever of 100.5. Levoxyl was stopped. Beta blocker has been stopped. No longer on ACE due to kidney impairment and elevated potassium level.   He has been seen in the device clinic. Telemetry data has been reviewed retrograde for multiple months. There was essentially no sustained pacing rates at 40 bpm. The slow heart rate during the office visit must have been one of very few brief occurrences of bradycardia and right ventricular pacing.   He saw Dr. [name] last month - felt to be doing ok but noted 4.1 cm left adrenal mass - work up in progress. A single blood culture was negative. He remained off his thyroid replacement. Cardiac meds not optimal given his cardiomyopathy but now limited by the bradycardia, hyperkalemia and soft BP's. LFTs noted to be elevated back in June as well.   Comes in today. Here alone. No more  fever/chills/nightsweats. No chest pain. Breathing is ok. Swelling is chronic - no worse - his weight is down some. He will be having a biopsy by IR for this adrenal mass - has not been scheduled. Will need to come off his coumadin. No history of stroke. Overall, seems to be holding his own.   Past Medical History:  Diagnosis Date  . AAA (abdominal aortic aneurysm) ([place])   . Anginal pain ([place])   . Arthritis   . CAD (coronary artery disease)    Adam [name] IS CARDIO  . CHF (congestive heart failure) ([place])   . Chronic anticoagulation   . Diverticulosis   . GERD (gastroesophageal reflux disease)   . Hyperlipidemia   . Hypertension   . LBP (low back pain)   . Permanent atrial fibrillation ([place])    decision made at [facility]  . Presence of permanent cardiac pacemaker   . S/P CABG (coronary artery bypass graft)   . Shortness of breath dyspnea   . Sleep apnea     Past Surgical History:  Procedure Laterality Date  . CARDIOVERSION  06/30/2012   Procedure: CARDIOVERSION;  Surgeon: Sinclair Grooms, MD;  Location[facility] ENDOSCOPY;  Service: Cardiovascular;  Laterality: N/A;  . CARDIOVERSION N/A 11/23/2013   Procedure: CARDIOVERSION;  Surgeon: Sinclair Grooms, MD;  Location: [place];  Service: Cardiovascular;  Laterality: N/A;  . CATARACT EXTRACTION W/ INTRAOCULAR LENS  IMPLANT,  BILATERAL    . CORONARY ARTERY BYPASS GRAFT  Feb 1989   "CABG X6"  . ENDOVASCULAR STENT INSERTION  07/28/2011   Procedure: ENDOVASCULAR STENT GRAFT INSERTION;  Surgeon: Hinda Lenis, MD;  Location: [place];  Service: Vascular;  Laterality: N/A;  Pt. on Coumadin/ instructed to hold 5 days prior to procedure  . EYE SURGERY    . INGUINAL HERNIA REPAIR Right 01/04/2015  . INGUINAL HERNIA REPAIR Right 01/04/2015   Procedure:  OPEN REPAIR RIGHT INGUNIAL HERNIA;  Surgeon: Fanny Skates, MD;  Location: [place];  Service: General;  Laterality: Right;  . INSERT / REPLACE / REMOVE PACEMAKER  July 2008  . INSERTION OF  MESH Right 01/04/2015   Procedure: INSERTION OF MESH;  Surgeon: Fanny Skates, MD;  Location: [place];  Service: General;  Laterality: Right;  . JOINT REPLACEMENT  1995 & 1996   Bilateral total knee replacements  . LEFT HEART CATHETERIZATION WITH CORONARY ANGIOGRAM N/A 03/28/2014   Procedure: LEFT HEART CATHETERIZATION WITH CORONARY ANGIOGRAM;  Surgeon: Sinclair Grooms, MD;  Location: [facility] CATH LAB;  Service: Cardiovascular;  Laterality: N/A;  . PERMANENT PACEMAKER GENERATOR CHANGE N/A 11/03/2013   Procedure: PERMANENT PACEMAKER GENERATOR CHANGE;  Surgeon: [name], MD;  Location: [facility] CATH LAB;  Service: Cardiovascular;  Laterality: N/A;  . TONSILLECTOMY       Medications: Current Meds  Medication Sig  . atorvastatin (LIPITOR) 10 MG tablet Take 1 tablet (10 mg total) by mouth daily. (Patient taking differently: Take 10 mg by mouth every evening. )  . Cholecalciferol (VITAMIN D) 2000 UNITS CAPS Take 2,000 Units by mouth daily.  . furosemide (LASIX) 40 MG tablet TAKE 1.5 TABLETS (60 MG TOTAL) BY MOUTH DAILY. (Patient taking differently: Take 60 mg by mouth daily. TAKE 1.5 TABLETS (60 MG TOTAL) BY MOUTH DAILY.)  . latanoprost (XALATAN) 0.005 % ophthalmic solution Place 1 drop into both eyes at bedtime.  . Multiple Vitamins-Minerals (RA VISION-VITE PRESERVE PO) Take 1 tablet by mouth daily.   . nitroGLYCERIN (NITROSTAT) 0.4 MG SL tablet Place 0.4 mg under the tongue every 5 (five) minutes as needed. For chest pain  . Omega-3 Fatty Acids (FISH OIL) 1000 MG CPDR Take 1 capsule by mouth daily.   . silodosin (RAPAFLO) 8 MG CAPS capsule Take 8 mg by mouth at bedtime.   [name] warfarin (COUMADIN) 5 MG tablet TAKE AS DIRECTED PER CLINIC INSTRUCTIONS     Allergies: Allergies  Allergen Reactions  . Lisinopril     Elevated potassium  . Morphine Nausea Only  . Amiodarone Anxiety and Rash    Social History: The patient  reports that he quit smoking about 68 years ago. His smoking use included  Cigarettes. He quit after 3.00 years of use. He has never used smokeless tobacco. He reports that he does not drink alcohol or use drugs.   Family History: The patient's family history includes Cancer in his sister; Heart attack in his father; Heart disease in his father and sister; Heart failure in his mother; Other in his sister; Stroke in his mother; Varicose Veins in his mother.   Review of Systems: Please see the history of present illness.   Otherwise, the review of systems is positive for briusing.   All other systems are reviewed and negative.   Physical Exam: VS:  BP 128/68 (BP Location: Left Arm, Patient Position: Sitting, Cuff Size: Normal)   Pulse (!) 54   Ht 5\' 8"  (1.727 m)   Wt 158  lb 12.8 oz (72 kg)   BMI 24.15 kg/m  .  BMI Body mass index is 24.15 kg/m.  Wt Readings from Last 3 Encounters:  03/02/17 158 lb 12.8 oz (72 kg)  01/28/17 156 lb 12.8 oz (71.1 kg)  01/25/17 160 lb (72.6 kg)    General: Pleasant. Elderly male who is alert and in no acute distress.  Weight is down 4 pounds over past 2 months.  HEENT: Normal.  Neck: Supple, no JVD, carotid bruits, or masses noted.  Cardiac: Irregular irregular rhythm. Chronic swelling in the ankles.  Respiratory:  Lungs are clear to auscultation bilaterally with normal work of breathing.  GI: Soft and nontender.  MS: No deformity or atrophy. Gait and ROM intact.  Skin: Warm and dry. Color is normal. Lots of bruising in his legs.  Neuro:  Strength and sensation are intact and no gross focal deficits noted.  Psych: Alert, appropriate and with normal affect.   LABORATORY DATA:  EKG:  EKG is ordered today. This demonstrates AF with a rate of 54 - one V paced beat noted.  Lab Results  Component Value Date   WBC 7.2 01/25/2017   HGB 12.7 (L) 01/25/2017   HCT 37.3 (L) 01/25/2017   PLT 162 01/25/2017   GLUCOSE 118 (H) 01/25/2017   CHOL 141 12/07/2016   TRIG 59 12/07/2016   HDL 56 12/07/2016   LDLCALC 73 12/07/2016   ALT  93 (H) 01/25/2017   AST 131 (H) 01/25/2017   NA 141 01/25/2017   K 3.9 01/25/2017   CL 98 (L) 01/25/2017   CREATININE 0.94 01/25/2017   BUN 12 01/25/2017   CO2 31 01/25/2017   TSH 2.469 01/25/2017   INR 2.3 03/02/2017     BNP (last 3 results)  Recent Labs  04/07/16 1529  BNP 162.8*    ProBNP (last 3 results) No results for input(s): PROBNP in the last 8760 hours.   Other Studies Reviewed Today:  Echocardiogram done 04/09/16:  Study Conclusions  - Left ventricle: The cavity size was normal. Wall thickness was normal. Indeterminant diastolic function (atrial fibrillation). The estimated ejection fraction was 35%. Diffuse hypokinesis. - Aortic valve: There was no stenosis. There was trivial regurgitation. - Mitral valve: Mildly calcified annulus. There was mild regurgitation. - Left atrium: The atrium was moderately dilated. - Right ventricle: The cavity size was normal. Systolic function was mildly reduced. - Right atrium: The atrium was moderately dilated. - Tricuspid valve: There was severe regurgitation. There was systolic flow reversal in the hepatic vein doppler signal. Peak RV-RA gradient (S): 50 mm Hg. - Pulmonary arteries: PA peak pressure: 65 mm Hg (S). - Systemic veins: IVC measured 2.5 cm with < 50% respirophasic variation, suggesting RA pressure 15 mmHg.  Impressions:  - The patient was in atrial fibrillation. Normal LV size with diffuse hypokinesis, EF 35%. The RV was normal in size with mildly decreased systolic function. Severe tricuspid regurgitation. Moderate pulmonary hypertension.    Assessment/Plan:  1. Chronic atrial fibrillation with controlled ventricular response and previous documentation of minimal pacing at the basal rate of 40 bpm after extensive review of pacemaker telemetry data. Beta blocker therapy has been discontinued. Will not reinstitute. This does compromise his heart failure therapy regimen. He is  currently doing well. HR is stable by EKG today. No changes made.  2. Adrenal mass - work up in progress - waiting on biopsy. No more fever/nightsweats 3. Chronic coumadin - checking INR today. He is to let us know  date of biopsy when scheduled.  4. Ischemic CM - unfortunately, not able to be on guide line therapy due to low BP, hyperkalemia and bradycardia. Only on diuretic. Fortunately, he looks to be doing ok. No changes made today.  5. Advanced age 61. Prior elevated TSH - last check ok - off replacement therapy - would follow going forward.    Current medicines are reviewed with the patient today.  The patient does not have concerns regarding medicines other than what has been noted above.  The following changes have been made:  See above.  Labs/ tests ordered today include:    Orders Placed This Encounter  Procedures  . EKG 12-Lead     Disposition:   FU with me or Dr. [name] in 3 to 4 months.    Patient is agreeable to this plan and will call if any problems develop in the interim.   SignedTruitt Merle, NP  03/02/2017 10:40 AM  [facility] [address] [place], [place]  [address] Phone: [phone] Fax: [phone]

## 2017-03-03 ENCOUNTER — Other Ambulatory Visit: Payer: Self-pay | Admitting: Internal Medicine

## 2017-03-03 DIAGNOSIS — D3500 Benign neoplasm of unspecified adrenal gland: Secondary | ICD-10-CM

## 2017-03-12 ENCOUNTER — Ambulatory Visit (HOSPITAL_COMMUNITY)
Admission: RE | Admit: 2017-03-12 | Discharge: 2017-03-12 | Disposition: A | Discharge status: Home / Self Care | Payer: Medicare Other | Source: Ambulatory Visit | Attending: Internal Medicine | Admitting: Internal Medicine

## 2017-03-12 DIAGNOSIS — D3500 Benign neoplasm of unspecified adrenal gland: Secondary | ICD-10-CM

## 2017-03-12 DIAGNOSIS — E279 Disorder of adrenal gland, unspecified: Secondary | ICD-10-CM | POA: Insufficient documentation

## 2017-03-12 DIAGNOSIS — D3501 Benign neoplasm of right adrenal gland: Secondary | ICD-10-CM | POA: Insufficient documentation

## 2017-03-12 MED ORDER — IOPAMIDOL (ISOVUE-300) INJECTION 61%
INTRAVENOUS | Status: AC
Start: 2017-03-12 — End: 2017-03-12
  Administered 2017-03-12: 100 mL
  Filled 2017-03-12: qty 100

## 2017-03-31 ENCOUNTER — Ambulatory Visit (INDEPENDENT_AMBULATORY_CARE_PROVIDER_SITE_OTHER): Payer: Medicare Other | Admitting: *Deleted

## 2017-03-31 DIAGNOSIS — Z5181 Encounter for therapeutic drug level monitoring: Secondary | ICD-10-CM | POA: Diagnosis not present

## 2017-03-31 DIAGNOSIS — I482 Chronic atrial fibrillation, unspecified: Secondary | ICD-10-CM

## 2017-03-31 DIAGNOSIS — I4891 Unspecified atrial fibrillation: Secondary | ICD-10-CM | POA: Diagnosis not present

## 2017-03-31 DIAGNOSIS — I255 Ischemic cardiomyopathy: Secondary | ICD-10-CM

## 2017-03-31 LAB — POCT INR: INR: 1.8

## 2017-04-01 ENCOUNTER — Ambulatory Visit (INDEPENDENT_AMBULATORY_CARE_PROVIDER_SITE_OTHER): Payer: Medicare Other | Admitting: *Deleted

## 2017-04-01 DIAGNOSIS — I495 Sick sinus syndrome: Secondary | ICD-10-CM | POA: Diagnosis not present

## 2017-04-01 DIAGNOSIS — I482 Chronic atrial fibrillation, unspecified: Secondary | ICD-10-CM

## 2017-04-02 NOTE — Progress Notes (Signed)
Remote pacemaker check. 

## 2017-04-08 LAB — CUP PACEART REMOTE DEVICE CHECK
Battery Remaining Longevity: 117 mo
Battery Voltage: 2.78 V
Implantable Lead Location: 753860
Implantable Lead Model: 4469
Implantable Lead Model: 4470
Implantable Lead Serial Number: 499409
Implantable Lead Serial Number: 602985
Implantable Pulse Generator Implant Date: 20150320
Lead Channel Pacing Threshold Amplitude: 1.25 V
Lead Channel Pacing Threshold Pulse Width: 0.4 ms
MDC IDC LEAD IMPLANT DT: 20080818
MDC IDC LEAD IMPLANT DT: 20080818
MDC IDC LEAD LOCATION: 753859
MDC IDC MSMT BATTERY IMPEDANCE: 277 Ohm
MDC IDC MSMT LEADCHNL RA IMPEDANCE VALUE: 67 Ohm
MDC IDC MSMT LEADCHNL RV IMPEDANCE VALUE: 457 Ohm
MDC IDC SESS DTM: 20180816203532
MDC IDC SET LEADCHNL RV PACING AMPLITUDE: 2.5 V
MDC IDC SET LEADCHNL RV PACING PULSEWIDTH: 0.4 ms
MDC IDC SET LEADCHNL RV SENSING SENSITIVITY: 5.6 mV
MDC IDC STAT BRADY RV PERCENT PACED: 14 %

## 2017-04-12 ENCOUNTER — Other Ambulatory Visit: Payer: Self-pay | Admitting: Nurse Practitioner

## 2017-04-12 MED ORDER — NITROGLYCERIN 0.4 MG SL SUBL: 0.4000 mg | SUBLINGUAL_TABLET | SUBLINGUAL | 5 refills | Status: DC | PRN

## 2017-04-15 DIAGNOSIS — M545 Low back pain: Secondary | ICD-10-CM | POA: Diagnosis not present

## 2017-04-16 ENCOUNTER — Encounter: Payer: Self-pay | Admitting: Cardiology

## 2017-04-21 ENCOUNTER — Ambulatory Visit (INDEPENDENT_AMBULATORY_CARE_PROVIDER_SITE_OTHER): Payer: Medicare Other | Admitting: *Deleted

## 2017-04-21 DIAGNOSIS — I482 Chronic atrial fibrillation, unspecified: Secondary | ICD-10-CM

## 2017-04-21 DIAGNOSIS — I4891 Unspecified atrial fibrillation: Secondary | ICD-10-CM

## 2017-04-21 DIAGNOSIS — Z5181 Encounter for therapeutic drug level monitoring: Secondary | ICD-10-CM

## 2017-04-21 LAB — POCT INR: INR: 3.2

## 2017-04-26 ENCOUNTER — Other Ambulatory Visit: Payer: Self-pay | Admitting: Interventional Cardiology

## 2017-04-26 DIAGNOSIS — M545 Low back pain: Secondary | ICD-10-CM | POA: Diagnosis not present

## 2017-05-12 ENCOUNTER — Ambulatory Visit (INDEPENDENT_AMBULATORY_CARE_PROVIDER_SITE_OTHER): Payer: Medicare Other | Admitting: *Deleted

## 2017-05-12 DIAGNOSIS — I4891 Unspecified atrial fibrillation: Secondary | ICD-10-CM | POA: Diagnosis not present

## 2017-05-12 DIAGNOSIS — Z5181 Encounter for therapeutic drug level monitoring: Secondary | ICD-10-CM | POA: Diagnosis not present

## 2017-05-12 DIAGNOSIS — I482 Chronic atrial fibrillation, unspecified: Secondary | ICD-10-CM

## 2017-05-12 LAB — POCT INR: INR: 3

## 2017-05-13 DIAGNOSIS — M545 Low back pain: Secondary | ICD-10-CM | POA: Diagnosis not present

## 2017-05-20 DIAGNOSIS — M545 Low back pain: Secondary | ICD-10-CM | POA: Diagnosis not present

## 2017-05-24 DIAGNOSIS — Z23 Encounter for immunization: Secondary | ICD-10-CM | POA: Diagnosis not present

## 2017-05-26 ENCOUNTER — Ambulatory Visit (INDEPENDENT_AMBULATORY_CARE_PROVIDER_SITE_OTHER): Payer: Medicare Other | Admitting: *Deleted

## 2017-05-26 DIAGNOSIS — I482 Chronic atrial fibrillation, unspecified: Secondary | ICD-10-CM

## 2017-05-26 DIAGNOSIS — Z5181 Encounter for therapeutic drug level monitoring: Secondary | ICD-10-CM | POA: Diagnosis not present

## 2017-05-26 DIAGNOSIS — I4891 Unspecified atrial fibrillation: Secondary | ICD-10-CM | POA: Diagnosis not present

## 2017-05-26 LAB — POCT INR: INR: 3.3

## 2017-05-27 DIAGNOSIS — H43813 Vitreous degeneration, bilateral: Secondary | ICD-10-CM | POA: Insufficient documentation

## 2017-05-27 DIAGNOSIS — Z961 Presence of intraocular lens: Secondary | ICD-10-CM | POA: Diagnosis not present

## 2017-05-27 DIAGNOSIS — H353132 Nonexudative age-related macular degeneration, bilateral, intermediate dry stage: Secondary | ICD-10-CM | POA: Diagnosis not present

## 2017-05-27 DIAGNOSIS — H40053 Ocular hypertension, bilateral: Secondary | ICD-10-CM | POA: Diagnosis not present

## 2017-05-28 DIAGNOSIS — M545 Low back pain: Secondary | ICD-10-CM | POA: Diagnosis not present

## 2017-06-04 DIAGNOSIS — M545 Low back pain: Secondary | ICD-10-CM | POA: Diagnosis not present

## 2017-06-09 ENCOUNTER — Ambulatory Visit (INDEPENDENT_AMBULATORY_CARE_PROVIDER_SITE_OTHER): Payer: Medicare Other | Admitting: *Deleted

## 2017-06-09 DIAGNOSIS — I4891 Unspecified atrial fibrillation: Secondary | ICD-10-CM | POA: Diagnosis not present

## 2017-06-09 DIAGNOSIS — I482 Chronic atrial fibrillation, unspecified: Secondary | ICD-10-CM

## 2017-06-09 DIAGNOSIS — Z5181 Encounter for therapeutic drug level monitoring: Secondary | ICD-10-CM | POA: Diagnosis not present

## 2017-06-09 LAB — POCT INR: INR: 2.2

## 2017-06-14 DIAGNOSIS — M545 Low back pain: Secondary | ICD-10-CM | POA: Diagnosis not present

## 2017-06-21 DIAGNOSIS — M545 Low back pain: Secondary | ICD-10-CM | POA: Diagnosis not present

## 2017-06-22 ENCOUNTER — Other Ambulatory Visit: Payer: Self-pay | Admitting: Interventional Cardiology

## 2017-06-28 ENCOUNTER — Ambulatory Visit (INDEPENDENT_AMBULATORY_CARE_PROVIDER_SITE_OTHER): Payer: Medicare Other | Admitting: Pharmacist

## 2017-06-28 DIAGNOSIS — Z5181 Encounter for therapeutic drug level monitoring: Secondary | ICD-10-CM

## 2017-06-28 DIAGNOSIS — I4891 Unspecified atrial fibrillation: Secondary | ICD-10-CM

## 2017-06-28 DIAGNOSIS — I482 Chronic atrial fibrillation, unspecified: Secondary | ICD-10-CM

## 2017-06-28 LAB — POCT INR: INR: 1.9

## 2017-06-29 DIAGNOSIS — M545 Low back pain: Secondary | ICD-10-CM | POA: Diagnosis not present

## 2017-07-01 ENCOUNTER — Ambulatory Visit (INDEPENDENT_AMBULATORY_CARE_PROVIDER_SITE_OTHER): Payer: Medicare Other | Admitting: *Deleted

## 2017-07-01 DIAGNOSIS — I495 Sick sinus syndrome: Secondary | ICD-10-CM

## 2017-07-01 NOTE — Progress Notes (Signed)
Remote pacemaker transmission.   

## 2017-07-04 NOTE — Progress Notes (Signed)
Cardiology Office Note    Date:  07/05/2017   ID:  Adam [name], DOB Aug 20, 1924, [name] [id number]  PCP:  [name], MD  Cardiologist: Sinclair Grooms, MD   Chief Complaint  Patient presents with  . Coronary Artery Disease    History of Present Illness:  Adam [name] is a 83 y.o. male of ischemic cardiomyopathy, chronic systolic heart failure, chronic atrial fibrillation, CAD with bypass graft failure (saphenous vein graft to the obtuse marginal), hypertension, chronic anticoagulation therapy, hypertension, and hyperlipidemia. EF is 35% per echo from 03/2016. He has a pacemaker in place.   Doing well.  He has trace lower extremity edema.  He denies orthopnea PND.  No significant episodes of chest pain.  He has not had syncope.  Appetite is been stable.  No blood in the urine or stool.  No falls.   Past Medical History:  Diagnosis Date  . AAA (abdominal aortic aneurysm) ([place])   . Anginal pain ([place])   . Arthritis   . CAD (coronary artery disease)    Adam [name] IS CARDIO  . CHF (congestive heart failure) ([place])   . Chronic anticoagulation   . Diverticulosis   . GERD (gastroesophageal reflux disease)   . Hyperlipidemia   . Hypertension   . LBP (low back pain)   . Permanent atrial fibrillation ([place])    decision made at [facility]  . Presence of permanent cardiac pacemaker   . S/P CABG (coronary artery bypass graft)   . Shortness of breath dyspnea   . Sleep apnea     Past Surgical History:  Procedure Laterality Date  . CARDIOVERSION N/A 11/23/2013   Performed by [name], MD at [place]  . CARDIOVERSION N/A 06/30/2012   Performed by Sinclair Grooms, MD at [place]  . CATARACT EXTRACTION W/ INTRAOCULAR LENS  IMPLANT, BILATERAL    . CORONARY ARTERY BYPASS GRAFT  Feb 1989   "CABG X6"  . ENDOVASCULAR STENT GRAFT INSERTION N/A 07/28/2011   Performed by Conrad Euless, MD at [place]  . EYE SURGERY    . INGUINAL HERNIA REPAIR Right 01/04/2015  .  INSERT / REPLACE / REMOVE PACEMAKER  July 2008  . INSERTION OF MESH Right 01/04/2015   Performed by Fanny Skates, MD at [place]   Bilateral total knee replacements  . LEFT HEART CATHETERIZATION WITH CORONARY ANGIOGRAM N/A 03/28/2014   Performed by [name], MD at [facility] CATH LAB  . OPEN REPAIR RIGHT INGUNIAL HERNIA Right 01/04/2015   Performed by Fanny Skates, MD at [place]  . PERMANENT PACEMAKER GENERATOR CHANGE N/A 11/03/2013   Performed by [name], MD at [facility] CATH LAB  . TONSILLECTOMY      Current Medications: Outpatient Medications Prior to Visit  Medication Sig Dispense Refill  . atorvastatin (LIPITOR) 10 MG tablet Take 1 tablet (10 mg total) by mouth daily. (Patient taking differently: Take 10 mg by mouth every evening. ) 90 tablet 2  . Cholecalciferol (VITAMIN D) 2000 UNITS CAPS Take 2,000 Units by mouth daily.    . furosemide (LASIX) 40 MG tablet TAKE ONE AND ONE-HALF TABLETS (60 MG TOTAL) BY MOUTH DAILY. 135 tablet 1  . latanoprost (XALATAN) 0.005 % ophthalmic solution Place 1 drop into both eyes at bedtime.    . nitroGLYCERIN (NITROSTAT) 0.4 MG SL tablet Place 1 tablet (0.4 mg total) under the tongue every 5 (  five) minutes as needed. For chest pain 25 tablet 5  . Omega-3 Fatty Acids (FISH OIL) 1000 MG CPDR Take 1 capsule by mouth daily.     . silodosin (RAPAFLO) 8 MG CAPS capsule Take 8 mg by mouth at bedtime.     [name] warfarin (COUMADIN) 5 MG tablet TAKE AS DIRECTED PER CLINIC INSTRUCTIONS (3 MONTH SUPPLY) 100 tablet 1  . Multiple Vitamins-Minerals (RA VISION-VITE PRESERVE PO) Take 1 tablet by mouth daily.      No facility-administered medications prior to visit.      Allergies:   Lisinopril; Morphine; and Amiodarone   Social History   Socioeconomic History  . Marital status: Married    Spouse name: None  . Number of children: None  . Years of education: None  . Highest education level: None  Social Needs  . Financial resource  strain: None  . Food insecurity - worry: None  . Food insecurity - inability: None  . Transportation needs - medical: None  . Transportation needs - non-medical: None  Occupational History  . None  Tobacco Use  . Smoking status: Former Smoker    Years: 3.00    Types: Cigarettes    Last attempt to quit: 08/17/1948    Years since quitting: 68.9  . Smokeless tobacco: Never Used  Substance and Sexual Activity  . Alcohol use: No  . Drug use: No  . Sexual activity: None  Other Topics Concern  . None  Social History Narrative  . None     Family History:  The patient's family history includes Cancer in his sister; Heart attack in his father; Heart disease in his father and sister; Heart failure in his mother; Other in his sister; Stroke in his mother; Varicose Veins in his mother.   ROS:   Please see the history of present illness.    Overall doing well with the exception of arthritis all over his body.  He inquires about medical marijuana and whether or not this could bring any symptom relief.  In addition to arthritis in his back, arms, and legs he complains of decreased hearing, leg pain, and difficulty urinating.  Easy bruising related to warfarin therapy. All other systems reviewed and are negative.   PHYSICAL EXAM:   VS:  BP 130/64   Pulse 65   Ht 5\' 8"  (1.727 m)   Wt 159 lb 6.4 oz (72.3 kg)   BMI 24.24 kg/m    GEN: Well nourished, well developed, in no acute distress  HEENT: normal  Neck: no JVD, carotid bruits, or masses Cardiac: RRR; no murmurs, rubs, or gallops.  There is trace bilateral ankle edema. Respiratory:  clear to auscultation bilaterally, normal work of breathing GI: soft, nontender, nondistended, + BS MS: no deformity or atrophy  Skin: warm and dry, no rash Neuro:  Alert and Oriented x 3, Strength and sensation are intact Psych: euthymic mood, full affect  Wt Readings from Last 3 Encounters:  07/05/17 159 lb 6.4 oz (72.3 kg)  03/02/17 158 lb 12.8 oz (72  kg)  01/28/17 156 lb 12.8 oz (71.1 kg)      Studies/Labs Reviewed:   EKG:  EKG not performed today.  The most recent tracing of July 2018 demonstrates VVI demand pacing, atrial fibrillation, but otherwise no significant abnormality.  Recent Labs: 01/25/2017: ALT 93; BUN 12; Creatinine, Ser 0.94; Hemoglobin 12.7; Platelets 162; Potassium 3.9; Sodium 141; TSH 2.469   Lipid Panel    Component Value Date/Time   CHOL 141  12/07/2016 1113   TRIG 59 12/07/2016 1113   HDL 56 12/07/2016 1113   CHOLHDL 2.5 12/07/2016 1113   LDLCALC 73 12/07/2016 1113    Additional studies/ records that were reviewed today include:  Echocardiography August 2017: Study Conclusions   - Left ventricle: The cavity size was normal. Wall thickness was   normal. Indeterminant diastolic function (atrial fibrillation).   The estimated ejection fraction was 35%. Diffuse hypokinesis. - Aortic valve: There was no stenosis. There was trivial   regurgitation. - Mitral valve: Mildly calcified annulus. There was mild   regurgitation. - Left atrium: The atrium was moderately dilated. - Right ventricle: The cavity size was normal. Systolic function   was mildly reduced. - Right atrium: The atrium was moderately dilated. - Tricuspid valve: There was severe regurgitation. There was   systolic flow reversal in the hepatic vein doppler signal. Peak   RV-RA gradient (S): 50 mm Hg. - Pulmonary arteries: PA peak pressure: 65 mm Hg (S). - Systemic veins: IVC measured 2.5 cm with < 50% respirophasic   variation, suggesting RA pressure 15 mmHg.   Impressions:   - The patient was in atrial fibrillation. Normal LV size with   diffuse hypokinesis, EF 35%. The RV was normal in size with   mildly decreased systolic function. Severe tricuspid   regurgitation. Moderate pulmonary hypertension.   ------------------------------------------------------------------- Labs, prior tests, procedures, and surgery: Permanent pacemaker  system implantation.   Coronary artery bypass grafting.    ASSESSMENT:    1. Chronic atrial fibrillation ([place])   2. Chronic systolic heart failure (HCC)   3. Atherosclerosis of coronary artery bypass graft of native heart with other forms of angina pectoris ([place])   4. HYPERTENSION, HEART CONTROLLED W/O ASSOC CHF   5. Long term current use of anticoagulant therapy      PLAN:  In order of problems listed above:  1. Stable without tachycardia. 2. No evidence of volume overload. 3. Denies angina pectoris.  Cautioned to use nitroglycerin if he has recurrent pain. 4. The blood pressure is adequately controlled.  Target blood pressure 140/90 mmHg or less but keep systolic and diastolic pressures above 063/01 mmHg given his age. 5. Continue long-term anticoagulation with Coumadin  Clinical follow-up in 1 year.  50-month follow-up with team member, Truitt Merle, [place]. Medical marijuana would be a reasonable consideration for the patient that referencing control of back pain.    Medication Adjustments/Labs and Tests Ordered: Current medicines are reviewed at length with the patient today.  Concerns regarding medicines are outlined above.  Medication changes, Labs and Tests ordered today are listed in the Patient Instructions below. There are no Patient Instructions on file for this visit.   Signed, Sinclair Grooms, MD  07/05/2017 10:17 AM    [facility] [place], [place], [place]  [address] Phone: [phone]; Fax: [phone]

## 2017-07-05 ENCOUNTER — Encounter: Payer: Self-pay | Admitting: Interventional Cardiology

## 2017-07-05 ENCOUNTER — Ambulatory Visit (INDEPENDENT_AMBULATORY_CARE_PROVIDER_SITE_OTHER): Payer: Medicare Other | Admitting: Interventional Cardiology

## 2017-07-05 VITALS — BP 130/64 | HR 65 | Ht 68.0 in | Wt 159.4 lb

## 2017-07-05 DIAGNOSIS — I5022 Chronic systolic (congestive) heart failure: Secondary | ICD-10-CM | POA: Diagnosis not present

## 2017-07-05 DIAGNOSIS — I255 Ischemic cardiomyopathy: Secondary | ICD-10-CM

## 2017-07-05 DIAGNOSIS — I482 Chronic atrial fibrillation, unspecified: Secondary | ICD-10-CM

## 2017-07-05 DIAGNOSIS — Z7901 Long term (current) use of anticoagulants: Secondary | ICD-10-CM | POA: Diagnosis not present

## 2017-07-05 DIAGNOSIS — I209 Angina pectoris, unspecified: Secondary | ICD-10-CM | POA: Diagnosis not present

## 2017-07-05 DIAGNOSIS — I119 Hypertensive heart disease without heart failure: Secondary | ICD-10-CM

## 2017-07-05 DIAGNOSIS — I25708 Atherosclerosis of coronary artery bypass graft(s), unspecified, with other forms of angina pectoris: Secondary | ICD-10-CM

## 2017-07-05 NOTE — Patient Instructions (Signed)
Medication Instructions:  Your physician recommends that you continue on your current medications as directed. Please refer to the Current Medication list given to you today.  Labwork: None  Testing/Procedures: None  Follow-Up: Your physician recommends that you schedule a follow-up appointment in: 6 months with Lori Gerhardt, NP.    Your physician wants you to follow-up in: 1 year with Dr. Smith.  You will receive a reminder letter in the mail two months in advance. If you don't receive a letter, please call our office to schedule the follow-up appointment.  Any Other Special Instructions Will Be Listed Below (If Applicable).     If you need a refill on your cardiac medications before your next appointment, please call your pharmacy.   

## 2017-07-06 DIAGNOSIS — M545 Low back pain: Secondary | ICD-10-CM | POA: Diagnosis not present

## 2017-07-07 ENCOUNTER — Encounter: Payer: Self-pay | Admitting: Cardiology

## 2017-07-12 ENCOUNTER — Ambulatory Visit (INDEPENDENT_AMBULATORY_CARE_PROVIDER_SITE_OTHER): Payer: Medicare Other | Admitting: Pharmacist

## 2017-07-12 DIAGNOSIS — I482 Chronic atrial fibrillation, unspecified: Secondary | ICD-10-CM

## 2017-07-12 DIAGNOSIS — Z5181 Encounter for therapeutic drug level monitoring: Secondary | ICD-10-CM | POA: Diagnosis not present

## 2017-07-12 DIAGNOSIS — I4891 Unspecified atrial fibrillation: Secondary | ICD-10-CM | POA: Diagnosis not present

## 2017-07-12 LAB — POCT INR: INR: 2.3

## 2017-07-12 NOTE — Patient Instructions (Signed)
Continue taking 1 tablet every day. Repeat INR in 3 weeks. Call us with any medication changes or any concerns # Main [phone] Coumadin Clinic [phone].

## 2017-07-14 LAB — CUP PACEART REMOTE DEVICE CHECK
Battery Remaining Longevity: 114 mo
Battery Voltage: 2.78 V
Brady Statistic RV Percent Paced: 14 %
Date Time Interrogation Session: 20181115152350
Implantable Lead Implant Date: 20080818
Implantable Lead Location: 753860
Implantable Lead Model: 4469
Implantable Lead Model: 4470
Implantable Lead Serial Number: 499409
Implantable Pulse Generator Implant Date: 20150320
Lead Channel Pacing Threshold Amplitude: 1.125 V
Lead Channel Pacing Threshold Pulse Width: 0.4 ms
Lead Channel Setting Pacing Amplitude: 2.5 V
Lead Channel Setting Pacing Pulse Width: 0.4 ms
MDC IDC LEAD IMPLANT DT: 20080818
MDC IDC LEAD LOCATION: 753859
MDC IDC LEAD SERIAL: 602985
MDC IDC MSMT BATTERY IMPEDANCE: 302 Ohm
MDC IDC MSMT LEADCHNL RA IMPEDANCE VALUE: 67 Ohm
MDC IDC MSMT LEADCHNL RV IMPEDANCE VALUE: 450 Ohm
MDC IDC SET LEADCHNL RV SENSING SENSITIVITY: 5.6 mV

## 2017-07-19 DIAGNOSIS — M545 Low back pain: Secondary | ICD-10-CM | POA: Diagnosis not present

## 2017-07-22 DIAGNOSIS — M545 Low back pain: Secondary | ICD-10-CM | POA: Diagnosis not present

## 2017-07-24 DIAGNOSIS — S01511A Laceration without foreign body of lip, initial encounter: Secondary | ICD-10-CM | POA: Diagnosis not present

## 2017-07-24 DIAGNOSIS — Z7901 Long term (current) use of anticoagulants: Secondary | ICD-10-CM | POA: Diagnosis not present

## 2017-07-27 DIAGNOSIS — T798XXA Other early complications of trauma, initial encounter: Secondary | ICD-10-CM | POA: Diagnosis not present

## 2017-08-02 ENCOUNTER — Ambulatory Visit (INDEPENDENT_AMBULATORY_CARE_PROVIDER_SITE_OTHER): Payer: Medicare Other | Admitting: *Deleted

## 2017-08-02 DIAGNOSIS — Z5181 Encounter for therapeutic drug level monitoring: Secondary | ICD-10-CM | POA: Diagnosis not present

## 2017-08-02 DIAGNOSIS — I482 Chronic atrial fibrillation, unspecified: Secondary | ICD-10-CM

## 2017-08-02 LAB — POCT INR: INR: 2

## 2017-08-02 NOTE — Patient Instructions (Signed)
Description   Continue taking 1 tablet every day. Repeat INR in 4 weeks. Call us with any medication changes or any concerns # Main [phone] Coumadin Clinic [phone].

## 2017-08-03 DIAGNOSIS — Z23 Encounter for immunization: Secondary | ICD-10-CM | POA: Diagnosis not present

## 2017-08-03 DIAGNOSIS — T798XXA Other early complications of trauma, initial encounter: Secondary | ICD-10-CM | POA: Diagnosis not present

## 2017-08-30 ENCOUNTER — Ambulatory Visit (INDEPENDENT_AMBULATORY_CARE_PROVIDER_SITE_OTHER): Payer: Medicare Other | Admitting: *Deleted

## 2017-08-30 DIAGNOSIS — Z5181 Encounter for therapeutic drug level monitoring: Secondary | ICD-10-CM

## 2017-08-30 DIAGNOSIS — I482 Chronic atrial fibrillation, unspecified: Secondary | ICD-10-CM

## 2017-08-30 LAB — POCT INR: INR: 2.2

## 2017-08-30 NOTE — Patient Instructions (Signed)
Description   Continue taking 1 tablet every day. Repeat INR in 4 weeks. Call us with any medication changes or any concerns # Main [phone] Coumadin Clinic [phone].

## 2017-09-20 DIAGNOSIS — N401 Enlarged prostate with lower urinary tract symptoms: Secondary | ICD-10-CM | POA: Diagnosis not present

## 2017-09-20 DIAGNOSIS — R3912 Poor urinary stream: Secondary | ICD-10-CM | POA: Diagnosis not present

## 2017-09-20 DIAGNOSIS — N398 Other specified disorders of urinary system: Secondary | ICD-10-CM | POA: Diagnosis not present

## 2017-09-20 DIAGNOSIS — R351 Nocturia: Secondary | ICD-10-CM | POA: Diagnosis not present

## 2017-09-27 ENCOUNTER — Ambulatory Visit (INDEPENDENT_AMBULATORY_CARE_PROVIDER_SITE_OTHER): Payer: Medicare Other | Admitting: *Deleted

## 2017-09-27 DIAGNOSIS — I482 Chronic atrial fibrillation, unspecified: Secondary | ICD-10-CM

## 2017-09-27 DIAGNOSIS — Z5181 Encounter for therapeutic drug level monitoring: Secondary | ICD-10-CM | POA: Diagnosis not present

## 2017-09-27 LAB — POCT INR: INR: 2.2

## 2017-09-27 NOTE — Patient Instructions (Signed)
Description   Continue taking 1 tablet every day. Repeat INR in 5 weeks. Call us with any medication changes or any concerns # Main [phone] Coumadin Clinic [phone].

## 2017-09-30 ENCOUNTER — Ambulatory Visit (INDEPENDENT_AMBULATORY_CARE_PROVIDER_SITE_OTHER): Payer: Medicare Other | Admitting: *Deleted

## 2017-09-30 DIAGNOSIS — I495 Sick sinus syndrome: Secondary | ICD-10-CM

## 2017-09-30 DIAGNOSIS — I8311 Varicose veins of right lower extremity with inflammation: Secondary | ICD-10-CM | POA: Diagnosis not present

## 2017-09-30 DIAGNOSIS — I8312 Varicose veins of left lower extremity with inflammation: Secondary | ICD-10-CM | POA: Diagnosis not present

## 2017-09-30 DIAGNOSIS — I872 Venous insufficiency (chronic) (peripheral): Secondary | ICD-10-CM | POA: Diagnosis not present

## 2017-09-30 DIAGNOSIS — D225 Melanocytic nevi of trunk: Secondary | ICD-10-CM | POA: Diagnosis not present

## 2017-09-30 DIAGNOSIS — L821 Other seborrheic keratosis: Secondary | ICD-10-CM | POA: Diagnosis not present

## 2017-09-30 DIAGNOSIS — Z85828 Personal history of other malignant neoplasm of skin: Secondary | ICD-10-CM | POA: Diagnosis not present

## 2017-09-30 NOTE — Progress Notes (Signed)
Remote pacemaker transmission.   

## 2017-10-06 ENCOUNTER — Encounter: Payer: Self-pay | Admitting: Cardiology

## 2017-10-15 LAB — CUP PACEART REMOTE DEVICE CHECK
Brady Statistic RV Percent Paced: 13 %
Date Time Interrogation Session: 20190214144041
Implantable Lead Implant Date: 20080818
Implantable Lead Location: 753859
Implantable Lead Model: 4470
Implantable Lead Serial Number: 499409
Lead Channel Impedance Value: 473 Ohm
Lead Channel Impedance Value: 67 Ohm
Lead Channel Pacing Threshold Amplitude: 1.125 V
Lead Channel Setting Pacing Pulse Width: 0.4 ms
MDC IDC LEAD IMPLANT DT: 20080818
MDC IDC LEAD LOCATION: 753860
MDC IDC LEAD SERIAL: 602985
MDC IDC MSMT BATTERY IMPEDANCE: 327 Ohm
MDC IDC MSMT BATTERY REMAINING LONGEVITY: 112 mo
MDC IDC MSMT BATTERY VOLTAGE: 2.78 V
MDC IDC MSMT LEADCHNL RV PACING THRESHOLD PULSEWIDTH: 0.4 ms
MDC IDC PG IMPLANT DT: 20150320
MDC IDC SET LEADCHNL RV PACING AMPLITUDE: 2.5 V
MDC IDC SET LEADCHNL RV SENSING SENSITIVITY: 5.6 mV

## 2017-10-18 ENCOUNTER — Other Ambulatory Visit: Payer: Self-pay | Admitting: Interventional Cardiology

## 2017-10-18 MED ORDER — ATORVASTATIN CALCIUM 10 MG PO TABS: 10.0000 mg | ORAL_TABLET | Freq: Every evening | ORAL | 2 refills | Status: DC

## 2017-10-18 MED ORDER — FUROSEMIDE 40 MG PO TABS: ORAL_TABLET | ORAL | 2 refills | Status: DC

## 2017-10-20 ENCOUNTER — Telehealth: Payer: Self-pay | Admitting: Interventional Cardiology

## 2017-10-20 MED ORDER — WARFARIN SODIUM 5 MG PO TABS: ORAL_TABLET | ORAL | 2 refills | Status: DC

## 2017-10-20 NOTE — Telephone Encounter (Signed)
New Message    *STAT* If patient is at the pharmacy, call can be transferred to refill team.   1. Which medications need to be refilled? (please list name of each medication and dose if known) warfarin (COUMADIN) 5 MG tablet    2. Which pharmacy/location (including street and city if local pharmacy) is medication to be sent to? [facility]   3. Do they need a 30 day or 90 day supply? 90 day

## 2017-11-05 ENCOUNTER — Ambulatory Visit (INDEPENDENT_AMBULATORY_CARE_PROVIDER_SITE_OTHER): Payer: Medicare Other | Admitting: *Deleted

## 2017-11-05 ENCOUNTER — Encounter: Payer: Self-pay | Admitting: Internal Medicine

## 2017-11-05 ENCOUNTER — Ambulatory Visit (INDEPENDENT_AMBULATORY_CARE_PROVIDER_SITE_OTHER): Payer: Medicare Other | Admitting: Internal Medicine

## 2017-11-05 VITALS — BP 140/60 | HR 60 | Ht 68.0 in | Wt 157.0 lb

## 2017-11-05 DIAGNOSIS — I495 Sick sinus syndrome: Secondary | ICD-10-CM

## 2017-11-05 DIAGNOSIS — I482 Chronic atrial fibrillation, unspecified: Secondary | ICD-10-CM

## 2017-11-05 DIAGNOSIS — Z5181 Encounter for therapeutic drug level monitoring: Secondary | ICD-10-CM | POA: Diagnosis not present

## 2017-11-05 DIAGNOSIS — Z950 Presence of cardiac pacemaker: Secondary | ICD-10-CM | POA: Diagnosis not present

## 2017-11-05 LAB — POCT INR: INR: 1.6

## 2017-11-05 NOTE — Patient Instructions (Addendum)
Description   Today take 1.5 tablets then continue taking 1 tablet every day. Repeat INR in 2 weeks. Call us with any medication changes or any concerns # Main [phone] Coumadin Clinic [phone].

## 2017-11-05 NOTE — Patient Instructions (Addendum)
Medication Instructions:  Your physician recommends that you continue on your current medications as directed. Please refer to the Current Medication list given to you today.  Call your pharmacy or insurance company to check prices on the following anticoagulants:  1. Eliquis 2. Pradaxa 3. Xarelto  Labwork: CBC and BMP today  Testing/Procedures: None ordered.  Follow-Up: Your physician recommends that you schedule a follow-up appointment in:   One Year with Tommye Standard, PA  Remote monitoring is used to monitor your Pacemaker from home. This monitoring reduces the number of office visits required to check your device to one time per year. It allows [place] to keep an eye on the functioning of your device to ensure it is working properly. You are scheduled for a device check from home on 02/07/2018. You may send your transmission at any time that day. If you have a wireless device, the transmission will be sent automatically. After your physician reviews your transmission, you will receive a postcard with your next transmission date.    Any Other Special Instructions Will Be Listed Below (If Applicable).     If you need a refill on your cardiac medications before your next appointment, please call your pharmacy.   Apixaban (Eliquis) oral tablets What is this medicine? APIXABAN (a PIX a ban) is an anticoagulant (blood thinner). It is used to lower the chance of stroke in people with a medical condition called atrial fibrillation. It is also used to treat or prevent blood clots in the lungs or in the veins. This medicine may be used for other purposes; ask your health care provider or pharmacist if you have questions. COMMON BRAND NAME(S): Eliquis What should I tell my health care provider before I take this medicine? They need to know if you have any of these conditions: -bleeding disorders -bleeding in the brain -blood in your stools (black or tarry stools) or if you have blood in  your vomit -history of stomach bleeding -kidney disease -liver disease -mechanical heart valve -an unusual or allergic reaction to apixaban, other medicines, foods, dyes, or preservatives -pregnant or trying to get pregnant -breast-feeding How should I use this medicine? Take this medicine by mouth with a glass of water. Follow the directions on the prescription label. You can take it with or without food. If it upsets your stomach, take it with food. Take your medicine at regular intervals. Do not take it more often than directed. Do not stop taking except on your doctor's advice. Stopping this medicine may increase your risk of a blot clot. Be sure to refill your prescription before you run out of medicine. Talk to your pediatrician regarding the use of this medicine in children. Special care may be needed. Overdosage: If you think you have taken too much of this medicine contact a poison control center or emergency room at once. NOTE: This medicine is only for you. Do not share this medicine with others. What if I miss a dose? If you miss a dose, take it as soon as you can. If it is almost time for your next dose, take only that dose. Do not take double or extra doses. What may interact with this medicine? This medicine may interact with the following: -aspirin and aspirin-like medicines -certain medicines for fungal infections like ketoconazole and itraconazole -certain medicines for seizures like carbamazepine and phenytoin -certain medicines that treat or prevent blood clots like warfarin, enoxaparin, and dalteparin -clarithromycin -NSAIDs, medicines for pain and inflammation, like ibuprofen or naproxen -rifampin -  ritonavir -St. John's wort This list may not describe all possible interactions. Give your health care provider a list of all the medicines, herbs, non-prescription drugs, or dietary supplements you use. Also tell them if you smoke, drink alcohol, or use illegal drugs. Some  items may interact with your medicine. What should I watch for while using this medicine? Visit your doctor or health care professional for regular checks on your progress. Notify your doctor or health care professional and seek emergency treatment if you develop breathing problems; changes in vision; chest pain; severe, sudden headache; pain, swelling, warmth in the leg; trouble speaking; sudden numbness or weakness of the face, arm or leg. These can be signs that your condition has gotten worse. If you are going to have surgery or other procedure, tell your doctor that you are taking this medicine. What side effects may I notice from receiving this medicine? Side effects that you should report to your doctor or health care professional as soon as possible: -allergic reactions like skin rash, itching or hives, swelling of the face, lips, or tongue -signs and symptoms of bleeding such as bloody or black, tarry stools; red or dark-brown urine; spitting up blood or brown material that looks like coffee grounds; red spots on the skin; unusual bruising or bleeding from the eye, gums, or nose This list may not describe all possible side effects. Call your doctor for medical advice about side effects. You may report side effects to FDA at 1-800-FDA-1088. Where should I keep my medicine? Keep out of the reach of children. Store at room temperature between 20 and 25 degrees C (68 and 77 degrees F). Throw away any unused medicine after the expiration date. NOTE: This sheet is a summary. It may not cover all possible information. If you have questions about this medicine, talk to your doctor, pharmacist, or health care provider.  2018 Elsevier/Gold Standard (2016-02-24 11:54:23)   Dabigatran (Pradaxa) oral capsules What is this medicine? DABIGATRAN (DA bi GAT ran) is an anticoagulant (blood thinner). It is used to lower the chance of stroke in people with a medical condition called atrial fibrillation. It  is also used to treat or prevent blood clots in the lungs or in the veins. This medicine may be used for other purposes; ask your health care provider or pharmacist if you have questions. COMMON BRAND NAME(S): Pradaxa What should I tell my health care provider before I take this medicine? They need to know if you have any of these conditions: -bleeding disorders -history of stomach bleeding -mechanical heart valve -kidney disease -recent or planned spinal or epidural procedure -an allergic reaction to dabigatran, other medicines, foods, dyes, or preservatives -pregnant or trying to get pregnant -breast-feeding How should I use this medicine? Take this medicine by mouth with a full glass of water. Follow the directions on the prescription label. Do not break, chew, or empty the contents from the capsule. Opening the capsule can increase your dose, which increases your risk of bleeding. You can take it with or without food. If it upsets your stomach, take it with food. Take your medicine at regular intervals. Do not take it more often than directed. Do not stop taking except on your doctor's advice. Stopping this medicine may increase your risk of a blot clot. Be sure to refill your prescription before you run out of medicine. A special MedGuide will be given to you by the pharmacist with each prescription and refill. Be sure to read this information carefully  each time. Talk to your pediatrician regarding the use of this medicine in children. Special care may be needed. Overdosage: If you think you have taken too much of this medicine contact a poison control center or emergency room at once. NOTE: This medicine is only for you. Do not share this medicine with others. What if I miss a dose? If you miss a dose, take it as soon as you can. If your next dose is less than 6 hours away, skip the missed dose. Do not take double or extra doses. What may interact with this medicine? Do not take this  medicine with any of the following medications: -certain medicines that treat or prevent blood clots like warfarin, enoxaparin, and dalteparin -mifepristone, RU-486 This medicine may also interact with the following: -carbamazepine -certain medicines for depression -dronedarone -ketoconazole -NSAIDs, medicines for pain and inflammation, like ibuprofen or naproxen -quinidine -rifampin -tipranavir This list may not describe all possible interactions. Give your health care provider a list of all the medicines, herbs, non-prescription drugs, or dietary supplements you use. Also tell them if you smoke, drink alcohol, or use illegal drugs. Some items may interact with your medicine. What should I watch for while using this medicine? Visit your doctor or health care professional for regular checks on your progress. Notify your doctor or health care professional and seek emergency treatment if you develop breathing problems; changes in vision; chest pain; severe, sudden headache; pain, swelling, warmth in the leg; trouble speaking; sudden numbness or weakness of the face, arm or leg. These can be signs that your condition has gotten worse. If you are going to have surgery or other procedure, tell your doctor that you are taking this medicine. What side effects may I notice from receiving this medicine? Side effects that you should report to your doctor or health care professional as soon as possible: -allergic reactions like skin rash, itching or hives, swelling of the face, lips, or tongue -feeling faint or lightheaded, falls -signs and symptoms of bleeding such as bloody or black, tarry stools; red or dark-brown urine; spitting up blood or brown material that looks like coffee grounds; red spots on the skin; unusual bruising or bleeding from the eye, gums, or nose Side effects that usually do not require medical attention (report to your doctor or health care professional if they continue or are  bothersome): -stomach pain -upset stomach This list may not describe all possible side effects. Call your doctor for medical advice about side effects. You may report side effects to FDA at 1-800-FDA-1088. Where should I keep my medicine? Keep out of the reach of children. Store between 20 and 25 degrees C (68 and 77 degrees F). Keep this medicine in the original container. Throw away any unused medicine after 4 months. NOTE: This sheet is a summary. It may not cover all possible information. If you have questions about this medicine, talk to your doctor, pharmacist, or health care provider.  2018 Elsevier/Gold Standard (2016-07-31 10:55:47)  Rivaroxaban (Xarelto) oral tablets What is this medicine? RIVAROXABAN (ri va ROX a ban) is an anticoagulant (blood thinner). It is used to treat blood clots in the lungs or in the veins. It is also used after knee or hip surgeries to prevent blood clots. It is also used to lower the chance of stroke in people with a medical condition called atrial fibrillation. This medicine may be used for other purposes; ask your health care provider or pharmacist if you have questions. COMMON BRAND  NAME(S): Xarelto, Xarelto Starter Pack What should I tell my health care provider before I take this medicine? They need to know if you have any of these conditions: -bleeding disorders -bleeding in the brain -blood in your stools (black or tarry stools) or if you have blood in your vomit -history of stomach bleeding -kidney disease -liver disease -low blood counts, like low white cell, platelet, or red cell counts -recent or planned spinal or epidural procedure -take medicines that treat or prevent blood clots -an unusual or allergic reaction to rivaroxaban, other medicines, foods, dyes, or preservatives -pregnant or trying to get pregnant -breast-feeding How should I use this medicine? Take this medicine by mouth with a glass of water. Follow the directions on the  prescription label. Take your medicine at regular intervals. Do not take it more often than directed. Do not stop taking except on your doctor's advice. Stopping this medicine may increase your risk of a blood clot. Be sure to refill your prescription before you run out of medicine. If you are taking this medicine after hip or knee replacement surgery, take it with or without food. If you are taking this medicine for atrial fibrillation, take it with your evening meal. If you are taking this medicine to treat blood clots, take it with food at the same time each day. If you are unable to swallow your tablet, you may crush the tablet and mix it in applesauce. Then, immediately eat the applesauce. You should eat more food right after you eat the applesauce containing the crushed tablet. Talk to your pediatrician regarding the use of this medicine in children. Special care may be needed. Overdosage: If you think you have taken too much of this medicine contact a poison control center or emergency room at once. NOTE: This medicine is only for you. Do not share this medicine with others. What if I miss a dose? If you take your medicine once a day and miss a dose, take the missed dose as soon as you remember. If you take your medicine twice a day and miss a dose, take the missed dose immediately. In this instance, 2 tablets may be taken at the same time. The next day you should take 1 tablet twice a day as directed. What may interact with this medicine? Do not take this medicine with any of the following medications: -defibrotide This medicine may also interact with the following medications: -aspirin and aspirin-like medicines -certain antibiotics like erythromycin, azithromycin, and clarithromycin -certain medicines for fungal infections like ketoconazole and itraconazole -certain medicines for irregular heart beat like amiodarone, quinidine, dronedarone -certain medicines for seizures like carbamazepine,  phenytoin -certain medicines that treat or prevent blood clots like warfarin, enoxaparin, and dalteparin -conivaptan -diltiazem -felodipine -indinavir -lopinavir; ritonavir -NSAIDS, medicines for pain and inflammation, like ibuprofen or naproxen -ranolazine -rifampin -ritonavir -SNRIs, medicines for depression, like desvenlafaxine, duloxetine, levomilnacipran, venlafaxine -SSRIs, medicines for depression, like citalopram, escitalopram, fluoxetine, fluvoxamine, paroxetine, sertraline -St. John's wort -verapamil This list may not describe all possible interactions. Give your health care provider a list of all the medicines, herbs, non-prescription drugs, or dietary supplements you use. Also tell them if you smoke, drink alcohol, or use illegal drugs. Some items may interact with your medicine. What should I watch for while using this medicine? Visit your doctor or health care professional for regular checks on your progress. Notify your doctor or health care professional and seek emergency treatment if you develop breathing problems; changes in vision; chest pain; severe,  sudden headache; pain, swelling, warmth in the leg; trouble speaking; sudden numbness or weakness of the face, arm or leg. These can be signs that your condition has gotten worse. If you are going to have surgery or other procedure, tell your doctor that you are taking this medicine. What side effects may I notice from receiving this medicine? Side effects that you should report to your doctor or health care professional as soon as possible: -allergic reactions like skin rash, itching or hives, swelling of the face, lips, or tongue -back pain -redness, blistering, peeling or loosening of the skin, including inside the mouth -signs and symptoms of bleeding such as bloody or black, tarry stools; red or dark-brown urine; spitting up blood or brown material that looks like coffee grounds; red spots on the skin; unusual bruising  or bleeding from the eye, gums, or nose Side effects that usually do not require medical attention (report to your doctor or health care professional if they continue or are bothersome): -dizziness -muscle pain This list may not describe all possible side effects. Call your doctor for medical advice about side effects. You may report side effects to FDA at 1-800-FDA-1088. Where should I keep my medicine? Keep out of the reach of children. Store at room temperature between 15 and 30 degrees C (59 and 86 degrees F). Throw away any unused medicine after the expiration date. NOTE: This sheet is a summary. It may not cover all possible information. If you have questions about this medicine, talk to your doctor, pharmacist, or health care provider.  2018 Elsevier/Gold Standard (2016-04-22 16:29:33)

## 2017-11-05 NOTE — Progress Notes (Signed)
Patient Care Team: [name], MD as PCP - General (Family Medicine) [name], MD (Cardiology) [name], MD (Cardiology) [name], MD as Consulting Physician (Cardiology)   HPI  Adam [name] is a 84 y.o. 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 . He has been seen by Dr. [name] at [facility]. Most recently he was seen October 2015 at which time because of persistent and now permanent atrial fibrillation    He has coronary disease and is status post CABG   DATE TEST EF   2015 Echo  25%   8/15 LHC EF 25%  LIMA/SVG-D1,RCA -patent SVG-OM-T  8/17 Echo   35 %         Date Cr K Hgb  6/18 0.94 3.9 12.7            Past Medical History:  Diagnosis Date  . AAA (abdominal aortic aneurysm) ([place])   . Anginal pain ([place])   . Arthritis   . CAD (coronary artery disease)    Adam [name] IS CARDIO  . CHF (congestive heart failure) ([place])   . Chronic anticoagulation   . Diverticulosis   . GERD (gastroesophageal reflux disease)   . Hyperlipidemia   . Hypertension   . LBP (low back pain)   . Permanent atrial fibrillation ([place])    decision made at [facility]  . Presence of permanent cardiac pacemaker   . S/P CABG (coronary artery bypass graft)   . Shortness of breath dyspnea   . Sleep apnea     Past Surgical History:  Procedure Laterality Date  . CARDIOVERSION  06/30/2012   Procedure: CARDIOVERSION;  Surgeon: Sinclair Grooms, MD;  Location: [facility] ENDOSCOPY;  Service: Cardiovascular;  Laterality: N/A;  . CARDIOVERSION N/A 11/23/2013   Procedure: CARDIOVERSION;  Surgeon: Sinclair Grooms, MD;  Location: [place];  Service: Cardiovascular;  Laterality: N/A;  . CATARACT EXTRACTION W/ INTRAOCULAR LENS  IMPLANT, BILATERAL    . CORONARY ARTERY  BYPASS GRAFT  Feb 1989   "CABG X6"  . ENDOVASCULAR STENT INSERTION  07/28/2011   Procedure: ENDOVASCULAR STENT GRAFT INSERTION;  Surgeon: Hinda Lenis, MD;  Location: [place];  Service: Vascular;  Laterality: N/A;  Pt. on Coumadin/ instructed to hold 5 days prior to procedure  . EYE SURGERY    . INGUINAL HERNIA REPAIR Right 01/04/2015  . INGUINAL HERNIA REPAIR Right 01/04/2015   Procedure:  OPEN REPAIR RIGHT INGUNIAL HERNIA;  Surgeon: Fanny Skates, MD;  Location: [place];  Service: General;  Laterality: Right;  . INSERT / REPLACE / REMOVE PACEMAKER  July 2008  . INSERTION OF MESH Right 01/04/2015   Procedure: INSERTION OF MESH;  Surgeon: Fanny Skates, MD;  Location: [place];  Service: General;  Laterality: Right;  . JOINT REPLACEMENT  1995 & 1996   Bilateral total knee replacements  . LEFT HEART CATHETERIZATION WITH CORONARY ANGIOGRAM N/A 03/28/2014   Procedure: LEFT HEART CATHETERIZATION WITH CORONARY ANGIOGRAM;  Surgeon: Sinclair Grooms, MD;  Location: [facility] CATH LAB;  Service: Cardiovascular;  Laterality: N/A;  . PERMANENT PACEMAKER GENERATOR CHANGE N/A 11/03/2013   Procedure: PERMANENT PACEMAKER GENERATOR CHANGE;  Surgeon: [name], MD;  Location: [facility] CATH LAB;  Service: Cardiovascular;  Laterality: N/A;  .  TONSILLECTOMY      Current Outpatient Medications  Medication Sig Dispense Refill  . atorvastatin (LIPITOR) 10 MG tablet Take 1 tablet (10 mg total) by mouth every evening. 90 tablet 2  . Cholecalciferol (VITAMIN D) 2000 UNITS CAPS Take 2,000 Units by mouth daily.    . furosemide (LASIX) 40 MG tablet TAKE ONE AND ONE-HALF TABLETS (60 MG TOTAL) BY MOUTH DAILY. 135 tablet 2  . latanoprost (XALATAN) 0.005 % ophthalmic solution Place 1 drop into both eyes at bedtime.    . nitroGLYCERIN (NITROSTAT) 0.4 MG SL tablet Place 1 tablet (0.4 mg total) under the tongue every 5 (five) minutes as needed. For chest pain 25 tablet 5  . Omega-3 Fatty Acids (FISH OIL) 1000 MG CPDR Take 1 capsule  by mouth daily.     . silodosin (RAPAFLO) 8 MG CAPS capsule Take 8 mg by mouth at bedtime.     [name] warfarin (COUMADIN) 5 MG tablet TAKE AS DIRECTED PER CLINIC INSTRUCTIONS (3 MONTH SUPPLY) 100 tablet 2   No current facility-administered medications for this visit.     Allergies  Allergen Reactions  . Lisinopril     Elevated potassium  . Morphine Nausea Only  . Amiodarone Anxiety and Rash    Review of Systems negative except from HPI and PMH  Physical Exam BP 140/60   Pulse 60   Ht 5\' 8"  (1.727 m)   Wt 157 lb (71.2 kg)   SpO2 96%   BMI 23.87 kg/m  Well developed and nourished in no acute distress HENT normal Neck supple with JVP-flat Clear Irregular iregular rate and rhythm, no murmurs or gallops Abd-soft with active BS No Clubbing cyanosis 1+edema Skin-warm and dry A & Oriented  Grossly normal sensory and motor function  ECG atrial fibrillation 63 Intervals-/0 9/40   Assessment and  Plan  Bradycardia  Atrial fibrillation-permanent  Ischemic cardiomyopathy  HFrEF   Pacemaker-[employer]   Arthritis      . We discussed the use of the NOACs compared to Coumadin. We briefly reviewed the data of at least comparability in stroke prevention, bleeding and outcome. We discussed some of the new once wherein somewhat associated with decreased ischemic stroke risk, one to be taken daily, and has been shown to be comparable and bleeding risk to aspirin.  We also discussed bleeding associated with warfarin as well as NOACs and a wall bleeding as a complication of all these drugs intracranial bleeding is more frequently associated with warfarin then the NOACs and a GI bleeding is more commonly associated with the latter  He will look into the cost of the NOACs and let us know which is his preference  He has severe limiting arthritis.  I did a quick up-to-date search.  I will asked Dr. [name] to weigh in on the role of Naprosyn.  There are 2 issues that need to be balanced  against the benefit.  The first is increased cardiovascular risk which is apparently quite minimal with Naprosyn compared to any of the other NSAIDs.  The other is GI bleeding risk which might be able to be largely attenuated by concomitant PPI therapy.  His back pain is so limiting, I think the incremental value of a relatively safe NSAID is worth exploring.  He would like Dr. Thompson Caul opinion.  Without symptoms of ischemia  On Anticoagulation;  No bleeding issues we will check on his hemoglobin given his chronic anticoagulation.  This would certainly inform the issue of NSAID use  With his  LV dysfunction, is he a candidate for carvedilol-- not tolerating ACE 2/2 hyperkalemia   More than 50% of 40  min was spent in counseling related to the above

## 2017-11-06 LAB — CUP PACEART INCLINIC DEVICE CHECK
Battery Impedance: 327 Ohm
Battery Remaining Longevity: 112 mo
Battery Voltage: 2.78 V
Brady Statistic RV Percent Paced: 12 %
Date Time Interrogation Session: 20190322180936
Implantable Lead Implant Date: 20080818
Implantable Lead Location: 753860
Implantable Lead Model: 4469
Implantable Lead Serial Number: 499409
Lead Channel Setting Pacing Amplitude: 2.5 V
Lead Channel Setting Pacing Pulse Width: 0.4 ms
Lead Channel Setting Sensing Sensitivity: 5.6 mV
MDC IDC LEAD IMPLANT DT: 20080818
MDC IDC LEAD LOCATION: 753859
MDC IDC LEAD SERIAL: 602985
MDC IDC MSMT LEADCHNL RA IMPEDANCE VALUE: 67 Ohm
MDC IDC MSMT LEADCHNL RV IMPEDANCE VALUE: 476 Ohm
MDC IDC MSMT LEADCHNL RV PACING THRESHOLD AMPLITUDE: 1.25 V
MDC IDC MSMT LEADCHNL RV PACING THRESHOLD PULSEWIDTH: 0.4 ms
MDC IDC MSMT LEADCHNL RV SENSING INTR AMPL: 15.67 mV
MDC IDC PG IMPLANT DT: 20150320

## 2017-11-06 LAB — CBC WITH DIFFERENTIAL/PLATELET
BASOS ABS: 0 10*3/uL (ref 0.0–0.2)
BASOS: 0 %
EOS (ABSOLUTE): 0.2 10*3/uL (ref 0.0–0.4)
Eos: 2 %
HEMOGLOBIN: 13.1 g/dL (ref 13.0–17.7)
Hematocrit: 40 % (ref 37.5–51.0)
Immature Grans (Abs): 0 10*3/uL (ref 0.0–0.1)
Immature Granulocytes: 0 %
LYMPHS ABS: 2.3 10*3/uL (ref 0.7–3.1)
Lymphs: 25 %
MCH: 33.1 pg — AB (ref 26.6–33.0)
MCHC: 32.8 g/dL (ref 31.5–35.7)
MCV: 101 fL — AB (ref 79–97)
MONOCYTES: 14 %
Monocytes Absolute: 1.3 10*3/uL — ABNORMAL HIGH (ref 0.1–0.9)
NEUTROS ABS: 5.5 10*3/uL (ref 1.4–7.0)
Neutrophils: 59 %
Platelets: 130 10*3/uL — ABNORMAL LOW (ref 150–379)
RBC: 3.96 x10E6/uL — ABNORMAL LOW (ref 4.14–5.80)
RDW: 14.2 % (ref 12.3–15.4)
WBC: 9.4 10*3/uL (ref 3.4–10.8)

## 2017-11-06 LAB — BASIC METABOLIC PANEL
BUN / CREAT RATIO: 18 (ref 10–24)
BUN: 21 mg/dL (ref 10–36)
CO2: 21 mmol/L (ref 20–29)
Calcium: 9.3 mg/dL (ref 8.6–10.2)
Chloride: 99 mmol/L (ref 96–106)
Creatinine, Ser: 1.16 mg/dL (ref 0.76–1.27)
GFR calc Af Amer: 64 mL/min/{1.73_m2} (ref >59)
GFR, EST NON AFRICAN AMERICAN: 55 mL/min/{1.73_m2} — AB (ref >59)
Glucose: 108 mg/dL — ABNORMAL HIGH (ref 65–99)
POTASSIUM: 4.4 mmol/L (ref 3.5–5.2)
Sodium: 142 mmol/L (ref 134–144)

## 2017-11-08 ENCOUNTER — Telehealth: Payer: Self-pay | Admitting: Internal Medicine

## 2017-11-08 NOTE — Telephone Encounter (Signed)
Spoke with Adam [name] wife. I advised her that Dr Caryl Comes was ok with any NOAC, but preferred Eliquis. She stated that Eliquis was affordable for them and would like to transition. I advised her that the coumadin clinic would help them transition, so during his next visit he will want to discuss this with them as there is already a note from Dr Caryl Comes stating he is okay with the transition. She verbalized understanding and had no additional questions.

## 2017-11-08 NOTE — Telephone Encounter (Signed)
LM with patient to call back.

## 2017-11-08 NOTE — Telephone Encounter (Signed)
New Message   Patient is calling in reference to anticoagulant prescriptions. They are wanting to know what Dr. Caryl Comes recommends. Please call to discuss.

## 2017-11-11 DIAGNOSIS — L821 Other seborrheic keratosis: Secondary | ICD-10-CM | POA: Diagnosis not present

## 2017-11-11 DIAGNOSIS — I8311 Varicose veins of right lower extremity with inflammation: Secondary | ICD-10-CM | POA: Diagnosis not present

## 2017-11-11 DIAGNOSIS — B353 Tinea pedis: Secondary | ICD-10-CM | POA: Diagnosis not present

## 2017-11-11 DIAGNOSIS — Z85828 Personal history of other malignant neoplasm of skin: Secondary | ICD-10-CM | POA: Diagnosis not present

## 2017-11-11 DIAGNOSIS — I8312 Varicose veins of left lower extremity with inflammation: Secondary | ICD-10-CM | POA: Diagnosis not present

## 2017-11-11 DIAGNOSIS — I872 Venous insufficiency (chronic) (peripheral): Secondary | ICD-10-CM | POA: Diagnosis not present

## 2017-11-22 ENCOUNTER — Ambulatory Visit (INDEPENDENT_AMBULATORY_CARE_PROVIDER_SITE_OTHER): Payer: Medicare Other | Admitting: Pharmacist

## 2017-11-22 DIAGNOSIS — I482 Chronic atrial fibrillation, unspecified: Secondary | ICD-10-CM

## 2017-11-22 DIAGNOSIS — Z5181 Encounter for therapeutic drug level monitoring: Secondary | ICD-10-CM

## 2017-11-22 LAB — POCT INR: INR: 2

## 2017-11-22 NOTE — Patient Instructions (Signed)
Continue taking 1 tablet every day. Repeat INR in 4 weeks. Call us with any medication changes or any concerns # Main [phone] Cou[facility]in [facility] [phone].

## 2017-11-24 DIAGNOSIS — M5136 Other intervertebral disc degeneration, lumbar region: Secondary | ICD-10-CM | POA: Diagnosis not present

## 2017-11-25 ENCOUNTER — Telehealth: Payer: Self-pay | Admitting: Interventional Cardiology

## 2017-11-25 NOTE — Telephone Encounter (Signed)
Left message to call back  

## 2017-11-25 NOTE — Telephone Encounter (Signed)
New Message   Pt c/o medication issue:  1. Name of Medication: Prednisolone  2. How are you currently taking this medication (dosage and times per day)? 10mg    3. Are you having a reaction (difficulty breathing--STAT)?  4. What is your medication issue? Wife is calling on behalf of spouse. She wants to make sure that its okay that her husband take the prednisone that he was prescribed for arthritis in his back. Please call to discuss.

## 2017-11-25 NOTE — Telephone Encounter (Signed)
Attempted to call wife back.  No answer and no VM.  Will try again later.

## 2017-11-26 NOTE — Telephone Encounter (Signed)
Spoke with pt and he states he was given a prednisone taper back for his arthritis.  Advised this is ok but can effect his INR so I am going to send the message to the [facility] team and have them follow up with pt to see if sooner CVRR appt needed.  Pt states he will finish Prednisone on Monday.

## 2017-11-29 ENCOUNTER — Ambulatory Visit (INDEPENDENT_AMBULATORY_CARE_PROVIDER_SITE_OTHER): Payer: Medicare Other | Admitting: *Deleted

## 2017-11-29 DIAGNOSIS — I482 Chronic atrial fibrillation, unspecified: Secondary | ICD-10-CM

## 2017-11-29 DIAGNOSIS — Z5181 Encounter for therapeutic drug level monitoring: Secondary | ICD-10-CM | POA: Diagnosis not present

## 2017-11-29 LAB — POCT INR: INR: 2.5

## 2017-11-29 NOTE — Patient Instructions (Signed)
Description   Continue taking 1 tablet every day. Repeat INR in 3 weeks. Call us with any medication changes or any concerns # Main [phone] Coumadin Clinic [phone]. Pt states he may change to Eliquis on this visit

## 2017-11-29 NOTE — Telephone Encounter (Signed)
Spoke with spouse is currently on a 6 day prednisone tapering that he completes today. Thus, appt scheduled for CVRR today to have INR done.

## 2017-12-13 DIAGNOSIS — W19XXXA Unspecified fall, initial encounter: Secondary | ICD-10-CM | POA: Diagnosis not present

## 2017-12-13 DIAGNOSIS — S300XXA Contusion of lower back and pelvis, initial encounter: Secondary | ICD-10-CM | POA: Diagnosis not present

## 2017-12-15 DIAGNOSIS — L84 Corns and callosities: Secondary | ICD-10-CM | POA: Diagnosis not present

## 2017-12-15 DIAGNOSIS — M79671 Pain in right foot: Secondary | ICD-10-CM | POA: Diagnosis not present

## 2017-12-20 ENCOUNTER — Ambulatory Visit (INDEPENDENT_AMBULATORY_CARE_PROVIDER_SITE_OTHER): Payer: Medicare Other | Admitting: *Deleted

## 2017-12-20 DIAGNOSIS — I482 Chronic atrial fibrillation, unspecified: Secondary | ICD-10-CM

## 2017-12-20 DIAGNOSIS — Z5181 Encounter for therapeutic drug level monitoring: Secondary | ICD-10-CM

## 2017-12-20 LAB — POCT INR: INR: 1.9

## 2017-12-20 NOTE — Patient Instructions (Signed)
Description   Today take 1.5 tablets, then Continue taking 1 tablet every day. Repeat INR in 2 weeks. Call us with any medication changes or any concerns # Main [phone],  Coumadin Clinic [phone]. [place] START YOUR ELIQUIS.- [phone].

## 2017-12-22 NOTE — Progress Notes (Signed)
Established EVAR   History of Present Illness   Adam [name] is a 84 y.o. ([birth year]) male who presents for routine follow up s/p EVAR (Date: 07/28/11).  Most recent EVAR duplex (Date: 12/29/2017) demonstrates: no endoleak and stable sac size.  Most recent CTA (Date: 12/10/14) demonstrates: no endoleak and decreasing sac size, 4.5 cm x 4.6 cm.  The patient has had chronic back pain that will require injection.  Pt denies any abdominal pain.  The patient's PMH, PSH, SH, and FamHx were reviewed on 15 MAY 19 are unchanged from 12/25/16.  Current Outpatient Medications  Medication Sig Dispense Refill  . atorvastatin (LIPITOR) 10 MG tablet Take 1 tablet (10 mg total) by mouth every evening. 90 tablet 2  . Cholecalciferol (VITAMIN D) 2000 UNITS CAPS Take 2,000 Units by mouth daily.    . furosemide (LASIX) 40 MG tablet TAKE ONE AND ONE-HALF TABLETS (60 MG TOTAL) BY MOUTH DAILY. 135 tablet 2  . latanoprost (XALATAN) 0.005 % ophthalmic solution Place 1 drop into both eyes at bedtime.    . nitroGLYCERIN (NITROSTAT) 0.4 MG SL tablet Place 1 tablet (0.4 mg total) under the tongue every 5 (five) minutes as needed. For chest pain 25 tablet 5  . Omega-3 Fatty Acids (FISH OIL) 1000 MG CPDR Take 1 capsule by mouth daily.     . silodosin (RAPAFLO) 8 MG CAPS capsule Take 8 mg by mouth at bedtime.     [name] warfarin (COUMADIN) 5 MG tablet TAKE AS DIRECTED PER CLINIC INSTRUCTIONS (3 MONTH SUPPLY) 100 tablet 2   No current facility-administered medications for this visit.     On ROS today: +arthritic pain, DDD requiring back injection   Physical Examination   Vitals:   12/29/17 0924 12/29/17 0927  BP: (!) 151/67 (!) 150/71  Pulse: 72 72  Resp: 18   Temp: (!) 96.6 F (35.9 C)   TempSrc: Axillary   SpO2: 100%   Weight: 157 lb (71.2 kg)   Height: 5\' 8"  (1.727 m)    Body mass index is 23.87 kg/m.  General Alert, O x 3, WD, NAD  Pulmonary Sym exp, good B air movt, CTA B  Cardiac RRR, Nl S1, S2,  no Murmurs, No rubs, No S3,S4  Vascular Vessel Right Left  Radial Palpable Palpable  Brachial Palpable Palpable  Carotid Palpable, No Bruit Palpable, No Bruit  Aorta Not palpable N/A  Femoral Palpable Palpable  Popliteal Not palpable Not palpable  PT Faintly palpable Faintly palpable  DP Palpable Palpable    Gastro- intestinal soft, non-distended, non-tender to palpation, No guarding or rebound, no HSM, no masses, no CVAT B, No palpable prominent aortic pulse,    Musculo- skeletal M/S 5/5 throughout  , Extremities without ischemic changes  , No edema present, No visible varicosities , No Lipodermatosclerosis present  Neurologic Pain and light touch intact in extremities , Motor exam as listed above    Non-Invasive Vascular Imaging   EVAR Duplex (12/29/2017)  AAA sac size: 4.6 cm x 4.7 cm (stable size, 4.7 cm 12/13/15)  No endoleak detected   Medical Decision Making   Adam [name] is a 84 y.o. male who presents s/p EVAR.  Pt is asymptomatic with stable sac size.  Large L adrenal mass, R adrenal aneoma   Pt notes he has been referred for evaluation of his adrenal masses.  No operative management reportedly after work-up.  I discussed with the patient the importance of surveillance of the endograft.  The next  endograft duplex will be scheduled for 12 months.  The patient will follow up with [place] in 12 months with these studies.  Thank you for allowing [place] to participate in this patient's care.   Adele Barthel, MD, FACS [facility] Office: [phone] Pager: [phone]

## 2017-12-28 ENCOUNTER — Telehealth: Payer: Self-pay

## 2017-12-28 DIAGNOSIS — M545 Low back pain, unspecified: Secondary | ICD-10-CM | POA: Insufficient documentation

## 2017-12-28 DIAGNOSIS — M48061 Spinal stenosis, lumbar region without neurogenic claudication: Secondary | ICD-10-CM | POA: Diagnosis not present

## 2017-12-28 NOTE — Telephone Encounter (Signed)
Pt's wife called transferred to Coumadin Clinic states pt has discussed switching from Warfarin to Eliquis with Dr Caryl Comes.  Pt needs new rx faxed to [facility] clinic fax # is [phone].  Will send message to Dr Fortino Sic to provide this rx.  Coumadin clinic does not write new Eliquis rx for pt's we only refill.

## 2017-12-28 NOTE — Telephone Encounter (Signed)
Spoke with Adam [name]. He is coming into the coumadin clinic and has an OV next Monday 5/20. At that time, coumadin clinic will be bridging him from Warfarin to Eliquis. Pt states he has a 90 day supply coming in the mail from the [place] but is not sure he will have it in by his OV. I told him I would leave samples of Eliquis for him to pick up during his OV with [facility] so he may start his bridging next week. Pt stated he would like for me to call back at a later time to speak with his wife regarding this as well. I will be in contact with her on Thur 5/16 when I am back in the office.

## 2017-12-29 ENCOUNTER — Ambulatory Visit (HOSPITAL_COMMUNITY)
Admission: RE | Admit: 2017-12-29 | Discharge: 2017-12-29 | Disposition: A | Discharge status: Home / Self Care | Payer: Medicare Other | Source: Ambulatory Visit | Attending: Vascular Surgery | Admitting: Vascular Surgery

## 2017-12-29 ENCOUNTER — Other Ambulatory Visit: Payer: Self-pay

## 2017-12-29 ENCOUNTER — Ambulatory Visit (INDEPENDENT_AMBULATORY_CARE_PROVIDER_SITE_OTHER): Payer: Medicare Other | Admitting: Vascular Surgery

## 2017-12-29 ENCOUNTER — Encounter: Payer: Self-pay | Admitting: Vascular Surgery

## 2017-12-29 VITALS — BP 150/71 | HR 72 | Temp 96.6°F | Resp 18 | Ht 68.0 in | Wt 157.0 lb

## 2017-12-29 DIAGNOSIS — I714 Abdominal aortic aneurysm, without rupture, unspecified: Secondary | ICD-10-CM

## 2017-12-29 DIAGNOSIS — Z9889 Other specified postprocedural states: Secondary | ICD-10-CM | POA: Diagnosis not present

## 2017-12-29 DIAGNOSIS — Z8679 Personal history of other diseases of the circulatory system: Secondary | ICD-10-CM

## 2017-12-30 ENCOUNTER — Telehealth: Payer: Self-pay | Admitting: Cardiology

## 2017-12-30 ENCOUNTER — Ambulatory Visit (INDEPENDENT_AMBULATORY_CARE_PROVIDER_SITE_OTHER): Payer: Medicare Other | Admitting: *Deleted

## 2017-12-30 DIAGNOSIS — R001 Bradycardia, unspecified: Secondary | ICD-10-CM | POA: Diagnosis not present

## 2017-12-30 NOTE — Progress Notes (Signed)
Remote pacemaker transmission.   

## 2017-12-30 NOTE — Telephone Encounter (Signed)
Spoke with pt's wife regarding his new start Eliquis. He has a prescription coming to him in the mail this week, however I have advised her to wait to start Eliquis until his Coumadin clinic appt on Monday 5/20 when the pharmacist will give him bridging instructions. She understands he is to remain on his Coumadin regimen until he gets instructions from pharmacy on Monday. I also offered to give him samples of Eliquis if needed on Monday.   Pt's wife verbalized understanding and will call if they have any additional questions.

## 2017-12-30 NOTE — Telephone Encounter (Signed)
Spoke with pt and reminded pt of remote transmission that is due today. Pt verbalized understanding.   

## 2017-12-31 ENCOUNTER — Other Ambulatory Visit (HOSPITAL_COMMUNITY): Payer: Medicare Other

## 2017-12-31 ENCOUNTER — Encounter: Payer: Self-pay | Admitting: Cardiology

## 2017-12-31 ENCOUNTER — Ambulatory Visit: Payer: Medicare Other | Admitting: Vascular Surgery

## 2017-12-31 LAB — CUP PACEART REMOTE DEVICE CHECK
Battery Voltage: 2.78 V
Brady Statistic RV Percent Paced: 11 %
Implantable Lead Implant Date: 20080818
Implantable Lead Location: 753859
Implantable Lead Serial Number: 602985
Lead Channel Impedance Value: 67 Ohm
Lead Channel Setting Pacing Amplitude: 2.5 V
Lead Channel Setting Sensing Sensitivity: 5.6 mV
MDC IDC LEAD IMPLANT DT: 20080818
MDC IDC LEAD LOCATION: 753860
MDC IDC LEAD SERIAL: 499409
MDC IDC MSMT BATTERY IMPEDANCE: 404 Ohm
MDC IDC MSMT BATTERY REMAINING LONGEVITY: 105 mo
MDC IDC MSMT LEADCHNL RV IMPEDANCE VALUE: 506 Ohm
MDC IDC MSMT LEADCHNL RV PACING THRESHOLD AMPLITUDE: 1.125 V
MDC IDC MSMT LEADCHNL RV PACING THRESHOLD PULSEWIDTH: 0.4 ms
MDC IDC MSMT LEADCHNL RV SENSING INTR AMPL: 11.2 mV
MDC IDC PG IMPLANT DT: 20150320
MDC IDC SESS DTM: 20190516152515
MDC IDC SET LEADCHNL RV PACING PULSEWIDTH: 0.4 ms

## 2018-01-03 ENCOUNTER — Ambulatory Visit (INDEPENDENT_AMBULATORY_CARE_PROVIDER_SITE_OTHER): Payer: Medicare Other | Admitting: Nurse Practitioner

## 2018-01-03 ENCOUNTER — Encounter: Payer: Self-pay | Admitting: Nurse Practitioner

## 2018-01-03 ENCOUNTER — Ambulatory Visit (INDEPENDENT_AMBULATORY_CARE_PROVIDER_SITE_OTHER): Payer: Medicare Other | Admitting: *Deleted

## 2018-01-03 VITALS — BP 130/62 | HR 73 | Ht 68.0 in | Wt 159.1 lb

## 2018-01-03 DIAGNOSIS — Z5181 Encounter for therapeutic drug level monitoring: Secondary | ICD-10-CM

## 2018-01-03 DIAGNOSIS — E7849 Other hyperlipidemia: Secondary | ICD-10-CM | POA: Diagnosis not present

## 2018-01-03 DIAGNOSIS — I482 Chronic atrial fibrillation, unspecified: Secondary | ICD-10-CM

## 2018-01-03 DIAGNOSIS — I255 Ischemic cardiomyopathy: Secondary | ICD-10-CM | POA: Diagnosis not present

## 2018-01-03 LAB — CBC
Hematocrit: 37.7 % (ref 37.5–51.0)
Hemoglobin: 12.8 g/dL — ABNORMAL LOW (ref 13.0–17.7)
MCH: 33.2 pg — ABNORMAL HIGH (ref 26.6–33.0)
MCHC: 34 g/dL (ref 31.5–35.7)
MCV: 98 fL — ABNORMAL HIGH (ref 79–97)
Platelets: 144 10*3/uL — ABNORMAL LOW (ref 150–450)
RBC: 3.85 x10E6/uL — ABNORMAL LOW (ref 4.14–5.80)
RDW: 14.4 % (ref 12.3–15.4)
WBC: 9.6 10*3/uL (ref 3.4–10.8)

## 2018-01-03 LAB — BASIC METABOLIC PANEL
BUN/Creatinine Ratio: 18 (ref 10–24)
BUN: 18 mg/dL (ref 10–36)
CO2: 25 mmol/L (ref 20–29)
Calcium: 9.2 mg/dL (ref 8.6–10.2)
Chloride: 99 mmol/L (ref 96–106)
Creatinine, Ser: 0.98 mg/dL (ref 0.76–1.27)
GFR calc Af Amer: 78 mL/min/{1.73_m2} (ref >59)
GFR calc non Af Amer: 68 mL/min/{1.73_m2} (ref >59)
Glucose: 96 mg/dL (ref 65–99)
Potassium: 4.5 mmol/L (ref 3.5–5.2)
Sodium: 139 mmol/L (ref 134–144)

## 2018-01-03 LAB — POCT INR: INR: 1.9 — AB (ref 2.0–3.0)

## 2018-01-03 LAB — HEPATIC FUNCTION PANEL
ALT: 13 IU/L (ref 0–44)
AST: 24 IU/L (ref 0–40)
Albumin: 4.4 g/dL (ref 3.2–4.6)
Alkaline Phosphatase: 67 IU/L (ref 39–117)
Bilirubin Total: 0.5 mg/dL (ref 0.0–1.2)
Bilirubin, Direct: 0.16 mg/dL (ref 0.00–0.40)
Total Protein: 7 g/dL (ref 6.0–8.5)

## 2018-01-03 LAB — LIPID PANEL
Chol/HDL Ratio: 2.6 ratio (ref 0.0–5.0)
Cholesterol, Total: 138 mg/dL (ref 100–199)
HDL: 53 mg/dL (ref >39)
LDL Calculated: 68 mg/dL (ref 0–99)
Triglycerides: 86 mg/dL (ref 0–149)
VLDL Cholesterol Cal: 17 mg/dL (ref 5–40)

## 2018-01-03 NOTE — Patient Instructions (Signed)
We will be checking the following labs today - BMET, CBC, HPF and lipids   Medication Instructions:    Continue with your current medicines.     Testing/Procedures To Be Arranged:  N/A  Follow-Up:   See Dr. [name] or me in 6 months.     Other Special Instructions:   N/A    If you need a refill on your cardiac medications before your next appointment, please call your pharmacy.   Call the [place] office at [phone] if you have any questions, problems or concerns.

## 2018-01-03 NOTE — Progress Notes (Signed)
CARDIOLOGY OFFICE NOTE  Date:  01/03/2018    Adam [name] Date of Birth: 11/17/1924 Medical Record #[id number]  PCP:  [name], MD  Cardiologist:  [facility]    Chief Complaint  Patient presents with  . Cardiomyopathy    Follow up visit - seen for Dr. [name]    History of Present Illness: Adam [name] is a 84 y.o. male who presents today for a follow up visit. Seen for Dr. [name].   He has a history of ischemic cardiomyopathy, chronic systolic heart failure, chronic atrial fibrillation, CAD with bypass graft failure (saphenous vein graft to the obtuse marginal), hypertension, chronic anticoagulation therapy, hypertension, and hyperlipidemia. EF is 35% per echo from 03/2016. He has a pacemaker in place - followed by Dr. Caryl Comes. He has been followed in the coumadin clinic.   He has mostly been limited by back pain.   Saw Dr. Caryl Comes in March of 2019. Discussed switching over to NOAC.   Last saw Dr. [name] in November of 2018 and was felt to be doing well.   Comes in today. Here alone. Soon to turn 89. He seems to be doing ok. Remains limited by his arthritis and back pain. He is seeing Dr. Nelva Bush. Sounds like he will be having shots in his back. He also seeing someone for some foot pain. He will be transitioning over to Eliquis. No chest pain. Breathing is ok. He is still able to go and try to work out some 3 to 4 days per week. He is considering swimming.  Seeing PCP next month for labs. He is hoping that switching to NOAC will help minimize some bruising that he has. No falls.   Past Medical History:  Diagnosis Date  . AAA (abdominal aortic aneurysm) ([place])   . Anginal pain ([place])   . Arthritis   . CAD (coronary artery disease)    [name]  . CHF (congestive heart failure) ([place])   . Chronic anticoagulation   . Diverticulosis   . GERD (gastroesophageal reflux disease)   . Hyperlipidemia   . Hypertension   . LBP (low back pain)   .  Permanent atrial fibrillation ([place])    decision made at [facility]  . Presence of permanent cardiac pacemaker   . S/P CABG (coronary artery bypass graft)   . Shortness of breath dyspnea   . Sleep apnea     Past Surgical History:  Procedure Laterality Date  . CARDIOVERSION  06/30/2012   Procedure: CARDIOVERSION;  Surgeon: Sinclair Grooms, MD;  Location: [facility] ENDOSCOPY;  Service: Cardiovascular;  Laterality: N/A;  . CARDIOVERSION N/A 11/23/2013   Procedure: CARDIOVERSION;  Surgeon: Sinclair Grooms, MD;  Location: [place];  Service: Cardiovascular;  Laterality: N/A;  . CATARACT EXTRACTION W/ INTRAOCULAR LENS  IMPLANT, BILATERAL    . CORONARY ARTERY BYPASS GRAFT  Feb 1989   "CABG X6"  . ENDOVASCULAR STENT INSERTION  07/28/2011   Procedure: ENDOVASCULAR STENT GRAFT INSERTION;  Surgeon: Hinda Lenis, MD;  Location: [place];  Service: Vascular;  Laterality: N/A;  Pt. on Coumadin/ instructed to hold 5 days prior to procedure  . EYE SURGERY    . INGUINAL HERNIA REPAIR Right 01/04/2015  . INGUINAL HERNIA REPAIR Right 01/04/2015   Procedure:  OPEN REPAIR RIGHT INGUNIAL HERNIA;  Surgeon: Fanny Skates, MD;  Location: [place];  Service: General;  Laterality: Right;  . INSERT / REPLACE / REMOVE PACEMAKER  July  2008  . INSERTION OF MESH Right 01/04/2015   Procedure: INSERTION OF MESH;  Surgeon: Fanny Skates, MD;  Location: [place];  Service: General;  Laterality: Right;  . JOINT REPLACEMENT  1995 & 1996   Bilateral total knee replacements  . LEFT HEART CATHETERIZATION WITH CORONARY ANGIOGRAM N/A 03/28/2014   Procedure: LEFT HEART CATHETERIZATION WITH CORONARY ANGIOGRAM;  Surgeon: Sinclair Grooms, MD;  Location: [facility] CATH LAB;  Service: Cardiovascular;  Laterality: N/A;  . PERMANENT PACEMAKER GENERATOR CHANGE N/A 11/03/2013   Procedure: PERMANENT PACEMAKER GENERATOR CHANGE;  Surgeon: [name], MD;  Location: [facility] CATH LAB;  Service: Cardiovascular;  Laterality: N/A;  . TONSILLECTOMY        Medications: Current Meds  Medication Sig  . Ascorbic Acid (VITAMIN C) 100 MG tablet Take 100 mg by mouth 2 (two) times daily.  [name] atorvastatin (LIPITOR) 10 MG tablet Take 1 tablet (10 mg total) by mouth every evening.  . Cholecalciferol (VITAMIN D) 2000 UNITS CAPS Take 2,000 Units by mouth daily.  . furosemide (LASIX) 40 MG tablet TAKE ONE AND ONE-HALF TABLETS (60 MG TOTAL) BY MOUTH DAILY.  [name] latanoprost (XALATAN) 0.005 % ophthalmic solution Place 1 drop into both eyes at bedtime.  . Multiple Vitamins-Minerals (PRESERVISION AREDS 2+MULTI VIT PO) PreserVision AREDS  . nitroGLYCERIN (NITROSTAT) 0.4 MG SL tablet Place 1 tablet (0.4 mg total) under the tongue every 5 (five) minutes as needed. For chest pain  . Omega-3 Fatty Acids (FISH OIL) 1000 MG CPDR Take 1 capsule by mouth daily.   . silodosin (RAPAFLO) 8 MG CAPS capsule Take 8 mg by mouth at bedtime.   [name] warfarin (COUMADIN) 5 MG tablet TAKE AS DIRECTED PER CLINIC INSTRUCTIONS (3 MONTH SUPPLY)     Allergies: Allergies  Allergen Reactions  . Amlodipine Other (See Comments)  . Lisinopril     Elevated potassium  . Morphine Nausea Only  . Amiodarone Anxiety and Rash    Social History: The patient  reports that he quit smoking about 69 years ago. His smoking use included cigarettes. He quit after 3.00 years of use. He has never used smokeless tobacco. He reports that he does not drink alcohol or use drugs.   Family History: The patient's family history includes Cancer in his sister; Heart attack in his father; Heart disease in his father and sister; Heart failure in his mother; Other in his sister; Stroke in his mother; Varicose Veins in his mother.   Review of Systems: Please see the history of present illness.   Otherwise, the review of systems is positive for none.   All other systems are reviewed and negative.   Physical Exam: VS:  BP 130/62 (BP Location: Left Arm, Patient Position: Sitting, Cuff Size: Normal)   Pulse 73    Ht 5\' 8"  (1.727 m)   Wt 159 lb 1.9 oz (72.2 kg)   SpO2 97% Comment: at rest  BMI 24.19 kg/m  .  BMI Body mass index is 24.19 kg/m.  Wt Readings from Last 3 Encounters:  01/03/18 159 lb 1.9 oz (72.2 kg)  12/29/17 157 lb (71.2 kg)  11/05/17 157 lb (71.2 kg)    General: Pleasant. Well developed, well nourished and in no acute distress.   HEENT: Normal.  Neck: Supple, no JVD, carotid bruits, or masses noted.  Cardiac: Irregular irregular rhythm. Rate is ok. Heart tones are distant. Trace edema.  Respiratory:  Lungs are clear to auscultation bilaterally with normal work of breathing.  GI: Soft and  nontender.  MS: No deformity or atrophy. Gait and ROM intact.  Skin: Warm and dry. Color is normal. Lots of bruising noted on his extremities.  Neuro:  Strength and sensation are intact and no gross focal deficits noted.  Psych: Alert, appropriate and with normal affect.   LABORATORY DATA:  EKG:  EKG is not ordered today.  Lab Results  Component Value Date   WBC 9.4 11/05/2017   HGB 13.1 11/05/2017   HCT 40.0 11/05/2017   PLT 130 (L) 11/05/2017   GLUCOSE 108 (H) 11/05/2017   CHOL 141 12/07/2016   TRIG 59 12/07/2016   HDL 56 12/07/2016   LDLCALC 73 12/07/2016   ALT 93 (H) 01/25/2017   AST 131 (H) 01/25/2017   NA 142 11/05/2017   K 4.4 11/05/2017   CL 99 11/05/2017   CREATININE 1.16 11/05/2017   BUN 21 11/05/2017   CO2 21 11/05/2017   TSH 2.469 01/25/2017   INR 1.9 12/20/2017     BNP (last 3 results) No results for input(s): BNP in the last 8760 hours.  ProBNP (last 3 results) No results for input(s): PROBNP in the last 8760 hours.   Other Studies Reviewed Today:  Echocardiography August 2017: Study Conclusions  - Left ventricle: The cavity size was normal. Wall thickness was normal. Indeterminant diastolic function (atrial fibrillation). The estimated ejection fraction was 35%. Diffuse hypokinesis. - Aortic valve: There was no stenosis. There was  trivial regurgitation. - Mitral valve: Mildly calcified annulus. There was mild regurgitation. - Left atrium: The atrium was moderately dilated. - Right ventricle: The cavity size was normal. Systolic function was mildly reduced. - Right atrium: The atrium was moderately dilated. - Tricuspid valve: There was severe regurgitation. There was systolic flow reversal in the hepatic vein doppler signal. Peak RV-RA gradient (S): 50 mm Hg. - Pulmonary arteries: PA peak pressure: 65 mm Hg (S). - Systemic veins: IVC measured 2.5 cm with < 50% respirophasic variation, suggesting RA pressure 15 mmHg.  Impressions:  - The patient was in atrial fibrillation. Normal LV size with diffuse hypokinesis, EF 35%. The RV was normal in size with mildly decreased systolic function. Severe tricuspid regurgitation. Moderate pulmonary hypertension.   Assessment & Plan:  1. Chronic AF - managed with PPM, rate control and anticoagulation.   2. Chronic anticoagulation - switching to NOAC   3. Elevated LFTs - needs recheck.  4. Extensive bruising - low platelets and elevated LFTs noted - rechecking today.   5. Ischemic CM - stable - no symptoms - still able to exercise some. Cardiac meds not optimal but has been limited by hyperkalemia and soft BP's. He has done surprisingly well.   6. OA - most limiting factor. Seeing Dr. Nelva Bush.   7. HTN - BP ok on current regimen.    Current medicines are reviewed with the patient today.  The patient does not have concerns regarding medicines other than what has been noted above.  The following changes have been made:  See above.  Labs/ tests ordered today include:    Orders Placed This Encounter  Procedures  . Basic metabolic panel  . CBC  . Hepatic function panel  . Lipid panel     Disposition:   FU with me or Dr. [name] in 6 months; to see EP in March of 2020.    Patient is agreeable to this plan and will call if any problems  develop in the interim.   SignedTruitt Merle, NP  01/03/2018 11:21 AM  [facility] [address] [place], [place]  [address] Phone: [phone] Fax: [phone]

## 2018-01-03 NOTE — Patient Instructions (Addendum)
Description   Start Eliquis 5mg  starting tonight at 8p and then continue taking twice a day at 8am and 8pm.  # Main [phone], Coumadin Clinic [phone], Recheck labs in 1 month for Eliquis follow-up.

## 2018-01-13 DIAGNOSIS — M5136 Other intervertebral disc degeneration, lumbar region: Secondary | ICD-10-CM | POA: Diagnosis not present

## 2018-01-17 DIAGNOSIS — M7741 Metatarsalgia, right foot: Secondary | ICD-10-CM | POA: Diagnosis not present

## 2018-01-17 DIAGNOSIS — L851 Acquired keratosis [keratoderma] palmaris et plantaris: Secondary | ICD-10-CM | POA: Diagnosis not present

## 2018-01-18 DIAGNOSIS — I10 Essential (primary) hypertension: Secondary | ICD-10-CM | POA: Diagnosis not present

## 2018-01-18 DIAGNOSIS — E279 Disorder of adrenal gland, unspecified: Secondary | ICD-10-CM | POA: Diagnosis not present

## 2018-01-18 DIAGNOSIS — E785 Hyperlipidemia, unspecified: Secondary | ICD-10-CM | POA: Diagnosis not present

## 2018-01-18 DIAGNOSIS — Z0000 Encounter for general adult medical examination without abnormal findings: Secondary | ICD-10-CM | POA: Diagnosis not present

## 2018-01-18 DIAGNOSIS — E039 Hypothyroidism, unspecified: Secondary | ICD-10-CM | POA: Diagnosis not present

## 2018-01-18 DIAGNOSIS — I4891 Unspecified atrial fibrillation: Secondary | ICD-10-CM | POA: Diagnosis not present

## 2018-01-27 DIAGNOSIS — H43813 Vitreous degeneration, bilateral: Secondary | ICD-10-CM | POA: Diagnosis not present

## 2018-01-27 DIAGNOSIS — H40053 Ocular hypertension, bilateral: Secondary | ICD-10-CM | POA: Diagnosis not present

## 2018-01-27 DIAGNOSIS — H353132 Nonexudative age-related macular degeneration, bilateral, intermediate dry stage: Secondary | ICD-10-CM | POA: Diagnosis not present

## 2018-01-27 DIAGNOSIS — Z961 Presence of intraocular lens: Secondary | ICD-10-CM | POA: Diagnosis not present

## 2018-01-28 DIAGNOSIS — M48061 Spinal stenosis, lumbar region without neurogenic claudication: Secondary | ICD-10-CM | POA: Diagnosis not present

## 2018-01-28 DIAGNOSIS — M545 Low back pain: Secondary | ICD-10-CM | POA: Diagnosis not present

## 2018-01-28 DIAGNOSIS — M5136 Other intervertebral disc degeneration, lumbar region: Secondary | ICD-10-CM | POA: Diagnosis not present

## 2018-01-31 ENCOUNTER — Ambulatory Visit: Payer: Medicare Other | Admitting: Pharmacist

## 2018-01-31 DIAGNOSIS — I482 Chronic atrial fibrillation, unspecified: Secondary | ICD-10-CM

## 2018-01-31 DIAGNOSIS — Z5181 Encounter for therapeutic drug level monitoring: Secondary | ICD-10-CM | POA: Diagnosis not present

## 2018-01-31 LAB — BASIC METABOLIC PANEL
BUN / CREAT RATIO: 22 (ref 10–24)
BUN: 21 mg/dL (ref 10–36)
CO2: 24 mmol/L (ref 20–29)
Calcium: 9.4 mg/dL (ref 8.6–10.2)
Chloride: 103 mmol/L (ref 96–106)
Creatinine, Ser: 0.96 mg/dL (ref 0.76–1.27)
GFR calc non Af Amer: 69 mL/min/{1.73_m2} (ref >59)
GFR, EST AFRICAN AMERICAN: 80 mL/min/{1.73_m2} (ref >59)
Glucose: 90 mg/dL (ref 65–99)
POTASSIUM: 4.5 mmol/L (ref 3.5–5.2)
Sodium: 142 mmol/L (ref 134–144)

## 2018-01-31 LAB — CBC
HEMATOCRIT: 37 % — AB (ref 37.5–51.0)
HEMOGLOBIN: 12.4 g/dL — AB (ref 13.0–17.7)
MCH: 33.2 pg — AB (ref 26.6–33.0)
MCHC: 33.5 g/dL (ref 31.5–35.7)
MCV: 99 fL — ABNORMAL HIGH (ref 79–97)
PLATELETS: 128 10*3/uL — AB (ref 150–450)
RBC: 3.73 x10E6/uL — AB (ref 4.14–5.80)
RDW: 13.9 % (ref 12.3–15.4)
WBC: 9.7 10*3/uL (ref 3.4–10.8)

## 2018-01-31 NOTE — Progress Notes (Deleted)
Pt was started on Eliquis for atrial fibrillation on 01/03/18 for 30 day supply.  Reviewed patients medication list.  Pt is not currently on any combined P-gp and strong CYP3A4 inhibitors/inducers (ketoconazole, traconazole, ritonavir, carbamazepine, phenytoin, rifampin, St. John's wort).  Reviewed labs.  SCr 0.96, Weight 72.6 kg (159.8 lbs), Age 84yo  Dose appropriate based on age, weight, and SCr.  Hgb and HCT Within Normal Limits  A full discussion of the nature of anticoagulants has been carried out.  A benefit/risk analysis has been presented to the patient, so that they understand the justification for choosing anticoagulation with Eliquis at this time.  The need for compliance is stressed.  Pt is aware to take the medication twice daily.  Side effects of potential bleeding are discussed, including unusual colored urine or stools, coughing up blood or coffee ground emesis, nose bleeds or serious fall or head trauma.  Discussed signs and symptoms of stroke. The patient should avoid any OTC items containing aspirin or ibuprofen.  Avoid alcohol consumption.   Call if any signs of abnormal bleeding.  Discussed financial obligations and resolved any difficulty in obtaining medication.  Next lab test in 6 months.   90 day Eliquis rx faxed to [place] [phone] per patient request. Pt made aware of lab results.

## 2018-01-31 NOTE — Progress Notes (Signed)
Pt was started on Eliquis for atrial fibrillation on 01/03/18 for 30 day supply.  Reviewed patients medication list.  Pt is not currently on any combined P-gp and strong CYP3A4 inhibitors/inducers (ketoconazole, traconazole, ritonavir, carbamazepine, phenytoin, rifampin, St. John's wort).  Reviewed labs.  SCr 0.96, Weight 72.6 kg (159.8 lbs), Age 84yo.  Dose appropriate based on age, weight, and SCr.  Hgb and HCT Within Normal Limits  A full discussion of the nature of anticoagulants has been carried out.  A benefit/risk analysis has been presented to the patient, so that they understand the justification for choosing anticoagulation with Eliquis at this time.  The need for compliance is stressed.  Pt is aware to take the medication twice daily.  Side effects of potential bleeding are discussed, including unusual colored urine or stools, coughing up blood or coffee ground emesis, nose bleeds or serious fall or head trauma.  Discussed signs and symptoms of stroke. The patient should avoid any OTC items containing aspirin or ibuprofen.  Avoid alcohol consumption.   Call if any signs of abnormal bleeding.  Discussed financial obligations and resolved any difficulty in obtaining medication.  Next lab test in 6 months.   90 day Eliquis rx faxed to [place] [phone] per patient request. Pt is aware of lab results.

## 2018-02-15 DIAGNOSIS — M5136 Other intervertebral disc degeneration, lumbar region: Secondary | ICD-10-CM | POA: Diagnosis not present

## 2018-02-16 ENCOUNTER — Ambulatory Visit: Payer: Self-pay | Admitting: Podiatry

## 2018-03-01 ENCOUNTER — Ambulatory Visit: Payer: Self-pay | Admitting: Podiatry

## 2018-03-02 DIAGNOSIS — L570 Actinic keratosis: Secondary | ICD-10-CM | POA: Diagnosis not present

## 2018-03-02 DIAGNOSIS — D225 Melanocytic nevi of trunk: Secondary | ICD-10-CM | POA: Diagnosis not present

## 2018-03-02 DIAGNOSIS — M5136 Other intervertebral disc degeneration, lumbar region: Secondary | ICD-10-CM | POA: Diagnosis not present

## 2018-03-02 DIAGNOSIS — I872 Venous insufficiency (chronic) (peripheral): Secondary | ICD-10-CM | POA: Diagnosis not present

## 2018-03-02 DIAGNOSIS — D1801 Hemangioma of skin and subcutaneous tissue: Secondary | ICD-10-CM | POA: Diagnosis not present

## 2018-03-02 DIAGNOSIS — I8312 Varicose veins of left lower extremity with inflammation: Secondary | ICD-10-CM | POA: Diagnosis not present

## 2018-03-02 DIAGNOSIS — D485 Neoplasm of uncertain behavior of skin: Secondary | ICD-10-CM | POA: Diagnosis not present

## 2018-03-02 DIAGNOSIS — L080 Pyoderma: Secondary | ICD-10-CM | POA: Diagnosis not present

## 2018-03-02 DIAGNOSIS — I8311 Varicose veins of right lower extremity with inflammation: Secondary | ICD-10-CM | POA: Diagnosis not present

## 2018-03-02 DIAGNOSIS — M48061 Spinal stenosis, lumbar region without neurogenic claudication: Secondary | ICD-10-CM | POA: Diagnosis not present

## 2018-03-02 DIAGNOSIS — L821 Other seborrheic keratosis: Secondary | ICD-10-CM | POA: Diagnosis not present

## 2018-03-02 DIAGNOSIS — Z85828 Personal history of other malignant neoplasm of skin: Secondary | ICD-10-CM | POA: Diagnosis not present

## 2018-03-02 DIAGNOSIS — R233 Spontaneous ecchymoses: Secondary | ICD-10-CM | POA: Diagnosis not present

## 2018-03-21 ENCOUNTER — Other Ambulatory Visit: Payer: Self-pay | Admitting: Interventional Cardiology

## 2018-03-21 MED ORDER — FUROSEMIDE 40 MG PO TABS: ORAL_TABLET | ORAL | 3 refills | Status: DC

## 2018-03-31 ENCOUNTER — Ambulatory Visit (INDEPENDENT_AMBULATORY_CARE_PROVIDER_SITE_OTHER): Payer: Medicare Other | Admitting: *Deleted

## 2018-03-31 DIAGNOSIS — I4892 Unspecified atrial flutter: Secondary | ICD-10-CM

## 2018-03-31 DIAGNOSIS — I495 Sick sinus syndrome: Secondary | ICD-10-CM

## 2018-03-31 NOTE — Progress Notes (Signed)
Remote pacemaker transmission.   

## 2018-04-07 ENCOUNTER — Telehealth: Payer: Self-pay | Admitting: Internal Medicine

## 2018-04-07 ENCOUNTER — Other Ambulatory Visit: Payer: Self-pay | Admitting: Internal Medicine

## 2018-04-07 ENCOUNTER — Other Ambulatory Visit: Payer: Medicare Other

## 2018-04-07 DIAGNOSIS — K921 Melena: Secondary | ICD-10-CM

## 2018-04-07 NOTE — Telephone Encounter (Signed)
New message   Pt c/o medication issue:  1. Name of Medication: apixaban (ELIQUIS) 5 MG TABS tablet  2. How are you currently taking this medication (dosage and times per day)?50 mg twice daily  3. Are you having a reaction (difficulty breathing--STAT)?no   4. What is your medication issue? Patient's wife states that he was bleeding profusely from bumping into something. Her concern is that the bleeding may not stop. She also states that the patient is having black stool.

## 2018-04-07 NOTE — Telephone Encounter (Signed)
Pts wife calling today. She is concerned bc pt has had some bruising sites and skin tares that have had prolonged bleeding. Pt has also had black stool recently. Pt is unable to see his PCP until 8/23, so I have advised pt to have CBC, BMP, and stool sample submitted to [facility] today so we may evaluate sooner. Pt's wife understands he may need to seek help from the ED if his labs look abnormal. Pt's wife verbalized understanding and agreed with plan.

## 2018-04-08 ENCOUNTER — Telehealth: Payer: Self-pay | Admitting: Internal Medicine

## 2018-04-08 DIAGNOSIS — R58 Hemorrhage, not elsewhere classified: Secondary | ICD-10-CM | POA: Diagnosis not present

## 2018-04-08 DIAGNOSIS — R198 Other specified symptoms and signs involving the digestive system and abdomen: Secondary | ICD-10-CM | POA: Diagnosis not present

## 2018-04-08 LAB — CBC WITH DIFFERENTIAL/PLATELET
Basophils Absolute: 0 10*3/uL (ref 0.0–0.2)
Basos: 0 %
EOS (ABSOLUTE): 0.2 10*3/uL (ref 0.0–0.4)
EOS: 2 %
HEMOGLOBIN: 13 g/dL (ref 13.0–17.7)
Hematocrit: 38.4 % (ref 37.5–51.0)
Immature Grans (Abs): 0.1 10*3/uL (ref 0.0–0.1)
Immature Granulocytes: 1 %
LYMPHS ABS: 2.6 10*3/uL (ref 0.7–3.1)
Lymphs: 24 %
MCH: 33.1 pg — AB (ref 26.6–33.0)
MCHC: 33.9 g/dL (ref 31.5–35.7)
MCV: 98 fL — AB (ref 79–97)
MONOCYTES: 12 %
Monocytes Absolute: 1.3 10*3/uL — ABNORMAL HIGH (ref 0.1–0.9)
NEUTROS ABS: 6.7 10*3/uL (ref 1.4–7.0)
Neutrophils: 61 %
Platelets: 136 10*3/uL — ABNORMAL LOW (ref 150–450)
RBC: 3.93 x10E6/uL — ABNORMAL LOW (ref 4.14–5.80)
RDW: 14.2 % (ref 12.3–15.4)
WBC: 10.8 10*3/uL (ref 3.4–10.8)

## 2018-04-08 LAB — BASIC METABOLIC PANEL
BUN / CREAT RATIO: 21 (ref 10–24)
BUN: 20 mg/dL (ref 10–36)
CHLORIDE: 99 mmol/L (ref 96–106)
CO2: 24 mmol/L (ref 20–29)
CREATININE: 0.97 mg/dL (ref 0.76–1.27)
Calcium: 9.6 mg/dL (ref 8.6–10.2)
GFR calc non Af Amer: 68 mL/min/{1.73_m2} (ref >59)
GFR, EST AFRICAN AMERICAN: 79 mL/min/{1.73_m2} (ref >59)
Glucose: 95 mg/dL (ref 65–99)
Potassium: 4.8 mmol/L (ref 3.5–5.2)
Sodium: 143 mmol/L (ref 134–144)

## 2018-04-08 NOTE — Telephone Encounter (Signed)
New Message    Jan from Dr. Myriam Jacobson office is calling to advise that they did a hemoccult on patient and it was negative.

## 2018-04-08 NOTE — Telephone Encounter (Signed)
Will let Dr Caryl Comes know .Adonis Housekeeper

## 2018-04-12 ENCOUNTER — Telehealth: Payer: Self-pay | Admitting: Internal Medicine

## 2018-04-12 NOTE — Telephone Encounter (Signed)
New Message   Pt c/o medication issue:  1. Name of Medication: apixaban (ELIQUIS) 5 MG TABS tablet  2. How are you currently taking this medication (dosage and times per day)? Take 5 mg by mouth 2 (two) times daily  3. Are you having a reaction (difficulty breathing--STAT)? yes  4. What is your medication issue? Pt's wife is calling, states the pt is having a lot of bleeding and does not know what to do. He is due to have an arthritis shot on Thursday and this is the 3rd shot and he is hesitant about do it. Please call

## 2018-04-12 NOTE — Telephone Encounter (Signed)
Spoke with pt's wife today who is concerned about pt's prolonged bleeding. She states he had a lip bleed recently that took a long time to stop. I have advised her to hold pressure to any site that has bleeding. She may use neosporin or petroleum jelly over the site to keep the bandage from sticking to his skin (which she states is "thin.") Pt is concerned about bleeding from his injection site after he has his cortizone shot on [place]. I have advised pt's wife to call his orthopedic doctor if there are concerns, however if he has been on anticoagulation during his last 2 injections, he should be okay.   We reviewed pt's lab work and reassured pt's wife that his labs looked reassuring as they are normal. Pt's wife has verbalized understanding and had no additional questions.

## 2018-04-13 LAB — FECAL OCCULT BLOOD, IMMUNOCHEMICAL: FECAL OCCULT BLD: NEGATIVE

## 2018-04-14 DIAGNOSIS — M5136 Other intervertebral disc degeneration, lumbar region: Secondary | ICD-10-CM | POA: Diagnosis not present

## 2018-04-28 DIAGNOSIS — M5136 Other intervertebral disc degeneration, lumbar region: Secondary | ICD-10-CM | POA: Diagnosis not present

## 2018-04-28 DIAGNOSIS — M4726 Other spondylosis with radiculopathy, lumbar region: Secondary | ICD-10-CM | POA: Diagnosis not present

## 2018-05-02 ENCOUNTER — Other Ambulatory Visit: Payer: Self-pay | Admitting: Chiropractic Medicine

## 2018-05-02 DIAGNOSIS — M4726 Other spondylosis with radiculopathy, lumbar region: Secondary | ICD-10-CM

## 2018-05-05 ENCOUNTER — Telehealth: Payer: Self-pay | Admitting: Internal Medicine

## 2018-05-05 ENCOUNTER — Ambulatory Visit
Admission: RE | Admit: 2018-05-05 | Discharge: 2018-05-05 | Disposition: A | Discharge status: Home / Self Care | Payer: Medicare Other | Source: Ambulatory Visit | Attending: Chiropractic Medicine | Admitting: Chiropractic Medicine

## 2018-05-05 DIAGNOSIS — M545 Low back pain: Secondary | ICD-10-CM | POA: Diagnosis not present

## 2018-05-05 DIAGNOSIS — M4726 Other spondylosis with radiculopathy, lumbar region: Secondary | ICD-10-CM

## 2018-05-05 NOTE — Telephone Encounter (Signed)
New Message         Patient wife is calling today because patient is bleeding on and off. Patient can not get the support hose over the bandage. Patient would like some advise. Pls call and advise.

## 2018-05-05 NOTE — Telephone Encounter (Signed)
LVM for return call. 

## 2018-05-06 NOTE — Telephone Encounter (Signed)
Pts wife is calling today to decide if her husband should keep his appt for Monday with Dr Caryl Comes. Pts wife states her husband is concerned with bleeding on Eliquis. He would like to speak with Dr Caryl Comes regarding his anticoagulation. I advised pt's wife that I felt it was appropriate for him to speak with Dr Caryl Comes regarding this if it will help with clarification.  Pt's wife had no additional questions.

## 2018-05-09 ENCOUNTER — Ambulatory Visit (INDEPENDENT_AMBULATORY_CARE_PROVIDER_SITE_OTHER): Payer: Medicare Other | Admitting: Internal Medicine

## 2018-05-09 ENCOUNTER — Encounter: Payer: Self-pay | Admitting: Internal Medicine

## 2018-05-09 VITALS — BP 136/64 | HR 52 | Ht 68.0 in | Wt 158.4 lb

## 2018-05-09 DIAGNOSIS — I495 Sick sinus syndrome: Secondary | ICD-10-CM | POA: Diagnosis not present

## 2018-05-09 DIAGNOSIS — Z950 Presence of cardiac pacemaker: Secondary | ICD-10-CM

## 2018-05-09 DIAGNOSIS — I255 Ischemic cardiomyopathy: Secondary | ICD-10-CM

## 2018-05-09 DIAGNOSIS — I482 Chronic atrial fibrillation, unspecified: Secondary | ICD-10-CM

## 2018-05-09 LAB — CUP PACEART INCLINIC DEVICE CHECK
Battery Remaining Longevity: 99 mo
Battery Voltage: 2.78 V
Brady Statistic RV Percent Paced: 13 %
Date Time Interrogation Session: 20190923190148
Implantable Lead Implant Date: 20080818
Implantable Lead Implant Date: 20080818
Implantable Lead Location: 753859
Implantable Lead Location: 753860
Implantable Lead Model: 4469
Implantable Lead Serial Number: 602985
Implantable Pulse Generator Implant Date: 20150320
Lead Channel Impedance Value: 67 Ohm
Lead Channel Pacing Threshold Amplitude: 1.25 V
Lead Channel Pacing Threshold Pulse Width: 0.4 ms
Lead Channel Setting Pacing Amplitude: 2.5 V
Lead Channel Setting Sensing Sensitivity: 5.6 mV
MDC IDC LEAD SERIAL: 499409
MDC IDC MSMT BATTERY IMPEDANCE: 454 Ohm
MDC IDC MSMT LEADCHNL RV IMPEDANCE VALUE: 473 Ohm
MDC IDC MSMT LEADCHNL RV SENSING INTR AMPL: 15.67 mV
MDC IDC SET LEADCHNL RV PACING PULSEWIDTH: 0.4 ms

## 2018-05-09 LAB — CUP PACEART REMOTE DEVICE CHECK
Battery Impedance: 454 Ohm
Battery Remaining Longevity: 106 mo
Implantable Lead Implant Date: 20080818
Implantable Lead Implant Date: 20080818
Implantable Lead Location: 753859
Implantable Lead Model: 4469
Implantable Lead Model: 4470
Implantable Lead Serial Number: 499409
Lead Channel Impedance Value: 466 Ohm
Lead Channel Impedance Value: 67 Ohm
Lead Channel Pacing Threshold Pulse Width: 0.4 ms
Lead Channel Setting Pacing Amplitude: 2.75 V
Lead Channel Setting Pacing Pulse Width: 0.4 ms
MDC IDC LEAD LOCATION: 753860
MDC IDC LEAD SERIAL: 602985
MDC IDC MSMT BATTERY VOLTAGE: 2.78 V
MDC IDC MSMT LEADCHNL RV PACING THRESHOLD AMPLITUDE: 1.375 V
MDC IDC PG IMPLANT DT: 20150320
MDC IDC SESS DTM: 20190815161344
MDC IDC SET LEADCHNL RV SENSING SENSITIVITY: 5.6 mV
MDC IDC STAT BRADY RV PERCENT PACED: 12 %

## 2018-05-09 MED ORDER — FUROSEMIDE 40 MG PO TABS: 80.0000 mg | ORAL_TABLET | ORAL | 3 refills | Status: DC

## 2018-05-09 NOTE — Progress Notes (Signed)
Patient Care Team: [name], MD as PCP - General (Family Medicine) [name], MD (Cardiology) [name], MD (Cardiology) [name], MD as Consulting Physician (Cardiology)   HPI  Adam [name] is a 84 y.o. 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 . He has been seen by Dr. [name] at [facility]. Most recently he was seen October 2015 at which time because of persistent and now permanent atrial fibrillation    He has coronary disease and is status post CABG  No chest pain.  No shortness of breath.  Some peripheral edema.  Wearing compression stockings but these are causing skin breakdown.    DATE TEST EF   2015 Echo  25%   8/15 LHC EF 25%  LIMA/SVG-D1,RCA -patent SVG-OM-T  8/17 Echo   35 %         Date Cr K Hgb  6/18 0.94 3.9 12.7  8/19  0.97 4.8 13.0      Past Medical History:  Diagnosis Date  . AAA (abdominal aortic aneurysm) ([place])   . Anginal pain ([place])   . Arthritis   . CAD (coronary artery disease)    Adam [name] IS CARDIO  . CHF (congestive heart failure) ([place])   . Chronic anticoagulation   . Diverticulosis   . GERD (gastroesophageal reflux disease)   . Hyperlipidemia   . Hypertension   . LBP (low back pain)   . Permanent atrial fibrillation (Adam [name])    decision made at [facility]  . Presence of permanent cardiac pacemaker   . S/P CABG (coronary artery bypass graft)   . Shortness of breath dyspnea   . Sleep apnea     Past Surgical History:  Procedure Laterality Date  . CARDIOVERSION  06/30/2012   Procedure: CARDIOVERSION;  Surgeon: Sinclair Grooms, MD;  [facility] ENDOSCOPY;  Service: Cardiovascular;  Laterality: N/A;  . CARDIOVERSION N/A 11/23/2013   Procedure: CARDIOVERSION;  Surgeon: Sinclair Grooms, MD;   Location: [place];  Service: Cardiovascular;  Laterality: N/A;  . CATARACT EXTRACTION W/ INTRAOCULAR LENS  IMPLANT, BILATERAL    . CORONARY ARTERY BYPASS GRAFT  Feb 1989   "CABG X6"  . ENDOVASCULAR STENT INSERTION  07/28/2011   Procedure: ENDOVASCULAR STENT GRAFT INSERTION;  Surgeon: Hinda Lenis, MD;  Location: [place];  Service: Vascular;  Laterality: N/A;  Pt. on Coumadin/ instructed to hold 5 days prior to procedure  . EYE SURGERY    . INGUINAL HERNIA REPAIR Right 01/04/2015  . INGUINAL HERNIA REPAIR Right 01/04/2015   Procedure:  OPEN REPAIR RIGHT INGUNIAL HERNIA;  Surgeon: Fanny Skates, MD;  Location: [place];  Service: General;  Laterality: Right;  . INSERT / REPLACE / REMOVE PACEMAKER  July 2008  . INSERTION OF MESH Right 01/04/2015   Procedure: INSERTION OF MESH;  Surgeon: Fanny Skates, MD;  Location: [place];  Service: General;  Laterality: Right;  . JOINT REPLACEMENT  1995 & 1996   Bilateral total knee replacements  . LEFT HEART CATHETERIZATION WITH CORONARY ANGIOGRAM N/A 03/28/2014   Procedure: LEFT HEART CATHETERIZATION WITH CORONARY ANGIOGRAM;  Surgeon: Sinclair Grooms, MD;  Location: [facility] CATH LAB;  Service: Cardiovascular;  Laterality: N/A;  . PERMANENT PACEMAKER GENERATOR CHANGE N/A 11/03/2013  Procedure: PERMANENT PACEMAKER GENERATOR CHANGE;  Surgeon: [name], MD;  Location: [facility] CATH LAB;  Service: Cardiovascular;  Laterality: N/A;  . TONSILLECTOMY      Current Outpatient Medications  Medication Sig Dispense Refill  . apixaban (ELIQUIS) 5 MG TABS tablet Take 5 mg by mouth 2 (two) times daily.    [name] atorvastatin (LIPITOR) 10 MG tablet Take 1 tablet (10 mg total) by mouth every evening. 90 tablet 2  . Cholecalciferol (VITAMIN D) 2000 UNITS CAPS Take 2,000 Units by mouth daily.    . furosemide (LASIX) 40 MG tablet TAKE ONE AND ONE-HALF TABLETS (60 MG TOTAL) BY MOUTH DAILY. 135 tablet 3  . latanoprost (XALATAN) 0.005 % ophthalmic solution Place 1 drop into both  eyes at bedtime.    . Multiple Vitamins-Minerals (PRESERVISION AREDS 2+MULTI VIT PO) Take 1 capsule by mouth 2 (two) times daily.     . nitroGLYCERIN (NITROSTAT) 0.4 MG SL tablet Place 1 tablet (0.4 mg total) under the tongue every 5 (five) minutes as needed. For chest pain 25 tablet 5  . Omega-3 Fatty Acids (FISH OIL) 1000 MG CPDR Take 1 capsule by mouth daily.     . silodosin (RAPAFLO) 8 MG CAPS capsule Take 8 mg by mouth at bedtime.      No current facility-administered medications for this visit.     Allergies  Allergen Reactions  . Amlodipine Other (See Comments)  . Lisinopril     Elevated potassium  . Morphine Nausea Only  . Amiodarone Anxiety and Rash    Review of Systems negative except from HPI and PMH  Physical Exam BP 136/64   Pulse (!) 52   Ht 5\' 8"  (1.727 m)   Wt 158 lb 6.4 oz (71.8 kg)   SpO2 95%   BMI 24.08 kg/m  Well developed and nourished in no acute distress HENT normal Neck supple with JVP-flat Clear Irregular iregular rate and rhythm, no murmurs or gallops Abd-soft with active BS No Clubbing cyanosis 2+ R>L edema Skin-warm and dry A & Oriented  Grossly normal sensory and motor function  ECG afib 52 -/09/40   Assessment and  Plan  Bradycardia  Atrial fibrillation-permanent  Ischemic cardiomyopathy  HFrEF   Pacemaker-Medtronic   Arthritis   Tolerating apixaban.  Dosed appropriately.  On Anticoagulation;  Some bleeding issues, but mostly superficial   Without symptoms of ischemia  Some volume overload; he has wearing compression hose with risk of skin breakdown; we will increase his Lasix from 60--80 every other day.  He will increase it further if he does not have brisk diuresis.  We spent more than 50% of our >25 min visit in face to face counseling regarding the above   Superficial bleeding

## 2018-05-09 NOTE — Patient Instructions (Signed)
Medication Instructions:  Your physician has recommended you make the following change in your medication:   1. Begin taking 80mg  Lasix, 2 tablets, every other day.    Labwork: None ordered.  Testing/Procedures: None ordered.  Follow-Up: Your physician wants you to follow-up in: One Year with Dr Caryl Comes. You will receive a reminder letter in the mail two months in advance. If you don't receive a letter, please call our office to schedule the follow-up appointment.  Remote monitoring is used to monitor your Pacemaker of ICD from home. This monitoring reduces the number of office visits required to check your device to one time per year. It allows [place] to keep an eye on the functioning of your device to ensure it is working properly. You are scheduled for a device check from home on 06/30/2018. You may send your transmission at any time that day. If you have a wireless device, the transmission will be sent automatically. After your physician reviews your transmission, you will receive a postcard with your next transmission date.    Any Other Special Instructions Will Be Listed Below (If Applicable).     If you need a refill on your cardiac medications before your next appointment, please call your pharmacy.

## 2018-05-10 ENCOUNTER — Telehealth: Payer: Self-pay | Admitting: Internal Medicine

## 2018-05-10 NOTE — Telephone Encounter (Signed)
Spoke with pt's wife, Learta Codding, regarding pt's medication. I advised her there are no drug interactions between Silodosin and Furosemide. Pt is okay to take the two medications as prescribed. Pt's wife has verbalized understanding and had no additional questions.

## 2018-05-10 NOTE — Telephone Encounter (Signed)
New Message:     Pt wants to know if taking Silodosin will interfere with him taking his Lasix? He is taking the Silodosin for an enlarged prostate.

## 2018-05-11 DIAGNOSIS — Z23 Encounter for immunization: Secondary | ICD-10-CM | POA: Diagnosis not present

## 2018-05-12 DIAGNOSIS — M48061 Spinal stenosis, lumbar region without neurogenic claudication: Secondary | ICD-10-CM | POA: Insufficient documentation

## 2018-05-12 DIAGNOSIS — M47816 Spondylosis without myelopathy or radiculopathy, lumbar region: Secondary | ICD-10-CM | POA: Diagnosis not present

## 2018-06-02 DIAGNOSIS — M47816 Spondylosis without myelopathy or radiculopathy, lumbar region: Secondary | ICD-10-CM | POA: Diagnosis not present

## 2018-06-28 ENCOUNTER — Telehealth: Payer: Self-pay | Admitting: Interventional Cardiology

## 2018-06-28 NOTE — Telephone Encounter (Signed)
   Patient's wife called and said patient is having issues with balance, headaches and his vision seems a little worse and weakness overall. Patient says he feels some numbness in his legs. Wife states patient has an appt with [name] on 07/05/18 but wonders if he needs to be seen sooner.

## 2018-06-28 NOTE — Telephone Encounter (Signed)
Spoke with wife.  Per wife Pt having some balance issues but these are not new.  Pt's head feels "funny".  Some vague complaints of weakness (no c/o numbness when I spoke with wife).  Advised to continue to monitor Pt for s/s of stroke, discussed symptoms to watch for.  Does not sound like complaints are cardiac related.  Asked wife if they had a BP cuff?  Wife says yes but they have not checked his BP.  Advised to monitor his BP and continue to monitor and if pt has any acute symptoms Pt should go to ER.  Advised Pt sounds stable to wait until next week for appt with Dr. [name].   Wife will return call if elevated BP or any acute s/s.

## 2018-06-30 ENCOUNTER — Encounter: Payer: Medicare Other | Admitting: *Deleted

## 2018-06-30 ENCOUNTER — Telehealth: Payer: Self-pay | Admitting: Cardiology

## 2018-06-30 DIAGNOSIS — M48061 Spinal stenosis, lumbar region without neurogenic claudication: Secondary | ICD-10-CM | POA: Diagnosis not present

## 2018-06-30 NOTE — Telephone Encounter (Signed)
LMOVM reminding pt to send remote transmission.   

## 2018-07-03 NOTE — Progress Notes (Signed)
Cardiology Office Note:    Date:  07/05/2018   ID:  Adam [name], DOB 1925-05-13, [name] [id number]  PCP:  [name], MD  Cardiologist:  Sinclair Grooms, MD   Referring MD: [name], MD   Chief Complaint  Patient presents with  . Coronary Artery Disease  . Atrial Fibrillation    History of Present Illness:    Adam [name] is a 84 y.o. male with a hx of of ischemic cardiomyopathy, permanent pacemaker, chronic systolic heart failure, chronic atrial fibrillation, CAD with bypass graft failure (saphenous vein graft to the obtuse marginal), hypertension, chronic anticoagulation therapy, hypertension, and hyperlipidemia. EF is 35% per echo from 03/2016.  He has numerous complaints, most of which are noncardiac.  He has stable mild dyspnea on exertion.  He also has trace to 1+ bilateral lower extremity edema.  Dr. Caryl Comes recently adjusted his diuretic dose to be slightly more aggressive.  This is led to improvement in both dyspnea and edema.  With activity he has experienced no chest discomfort.  He has not required nitroglycerin.  Activity has significantly decreased because of decreased hearing and vision.  He feels unsteady when he walks.   Past Medical History:  Diagnosis Date  . AAA (abdominal aortic aneurysm) ([place])   . Anginal pain ([place])   . Arthritis   . CAD (coronary artery disease)    HANK Malikah [place]y IS CARDIO  . CHF (congestive heart failure) ([place])   . Chronic anticoagulation   . Diverticulosis   . GERD (gastroesophageal reflux disease)   . Hyperlipidemia   . Hypertension   . LBP (low back pain)   . Permanent atrial fibrillation    decision made at [facility]  . Presence of permanent cardiac pacemaker   . S/P CABG (coronary artery bypass graft)   . Shortness of breath dyspnea   . Sleep apnea     Past Surgical History:  Procedure Laterality Date  . CARDIOVERSION  06/30/2012   Procedure: CARDIOVERSION;  Surgeon: Sinclair Grooms, MD;  Location:  [facility] ENDOSCOPY;  Service: Cardiovascular;  Laterality: N/A;  . CARDIOVERSION N/A 11/23/2013   Procedure: CARDIOVERSION;  Surgeon: Sinclair Grooms, MD;  Location: [place];  Service: Cardiovascular;  Laterality: N/A;  . CATARACT EXTRACTION W/ INTRAOCULAR LENS  IMPLANT, BILATERAL    . CORONARY ARTERY BYPASS GRAFT  Feb 1989   "CABG X6"  . ENDOVASCULAR STENT INSERTION  07/28/2011   Procedure: ENDOVASCULAR STENT GRAFT INSERTION;  Surgeon: Hinda Lenis, MD;  Location: [place];  Service: Vascular;  Laterality: N/A;  Pt. on Coumadin/ instructed to hold 5 days prior to procedure  . EYE SURGERY    . INGUINAL HERNIA REPAIR Right 01/04/2015  . INGUINAL HERNIA REPAIR Right 01/04/2015   Procedure:  OPEN REPAIR RIGHT INGUNIAL HERNIA;  Surgeon: Fanny Skates, MD;  Location: [place];  Service: General;  Laterality: Right;  . INSERT / REPLACE / REMOVE PACEMAKER  July 2008  . INSERTION OF MESH Right 01/04/2015   Procedure: INSERTION OF MESH;  Surgeon: Fanny Skates, MD;  Location: [place];  Service: General;  Laterality: Right;  . JOINT REPLACEMENT  1995 & 1996   Bilateral total knee replacements  . LEFT HEART CATHETERIZATION WITH CORONARY ANGIOGRAM N/A 03/28/2014   Procedure: LEFT HEART CATHETERIZATION WITH CORONARY ANGIOGRAM;  Surgeon: Sinclair Grooms, MD;  Location: [facility] CATH LAB;  Service: Cardiovascular;  Laterality: N/A;  . PERMANENT PACEMAKER GENERATOR CHANGE N/A 11/03/2013  Procedure: PERMANENT PACEMAKER GENERATOR CHANGE;  Surgeon: [name], MD;  Location: [facility] CATH LAB;  Service: Cardiovascular;  Laterality: N/A;  . TONSILLECTOMY      Current Medications: Current Meds  Medication Sig  . acetaminophen (TYLENOL 8 HOUR ARTHRITIS PAIN) 650 MG CR tablet Take 650 mg by mouth every 8 (eight) hours as needed for pain.  [name] apixaban (ELIQUIS) 5 MG TABS tablet Take 5 mg by mouth 2 (two) times daily.  [name] atorvastatin (LIPITOR) 10 MG tablet Take 1 tablet (10 mg total) by mouth every evening.  .  Cholecalciferol (VITAMIN D) 2000 UNITS CAPS Take 2,000 Units by mouth daily.  . furosemide (LASIX) 40 MG tablet Take 2 tablets (80 mg total) by mouth every other day. TAKE ONE AND ONE-HALF TABLETS (60 MG TOTAL) BY MOUTH DAILY.  [name] latanoprost (XALATAN) 0.005 % ophthalmic solution Place 1 drop into both eyes at bedtime.  . Multiple Vitamins-Minerals (PRESERVISION AREDS 2+MULTI VIT PO) Take 1 capsule by mouth 2 (two) times daily.   . nitroGLYCERIN (NITROSTAT) 0.4 MG SL tablet Place 1 tablet (0.4 mg total) under the tongue every 5 (five) minutes as needed. For chest pain  . Omega-3 Fatty Acids (FISH OIL) 1000 MG CPDR Take 1 capsule by mouth daily.   . silodosin (RAPAFLO) 8 MG CAPS capsule Take 8 mg by mouth at bedtime.      Allergies:   Amlodipine; Lisinopril; Morphine; and Amiodarone   Social History   Socioeconomic History  . Marital status: Married    Spouse name: Not on file  . Number of children: Not on file  . Years of education: Not on file  . Highest education level: Not on file  Occupational History  . Not on file  Social Needs  . Financial resource strain: Not on file  . Food insecurity:    Worry: Not on file    Inability: Not on file  . Transportation needs:    Medical: Not on file    Non-medical: Not on file  Tobacco Use  . Smoking status: Former Smoker    Years: 3.00    Types: Cigarettes    Last attempt to quit: 08/17/1948    Years since quitting: 69.9  . Smokeless tobacco: Never Used  Substance and Sexual Activity  . Alcohol use: No  . Drug use: No  . Sexual activity: Not on file  Lifestyle  . Physical activity:    Days per week: Not on file    Minutes per session: Not on file  . Stress: Not on file  Relationships  . Social connections:    Talks on phone: Not on file    Gets together: Not on file    Attends religious service: Not on file    Active member of club or organization: Not on file    Attends meetings of clubs or organizations: Not on file     Relationship status: Not on file  Other Topics Concern  . Not on file  Social History Narrative  . Not on file     Family History: The patient's  family history includes Cancer in his sister; Heart attack in his father; Heart disease in his father and sister; Heart failure in his mother; Other in his sister; Stroke in his mother; Varicose Veins in his mother.  ROS:   Please see the history of present illness.    Change in appetite, weight loss, fatigue, anxiety, difficulty with urine, balance and walking instability, constipation, easy bruising, dizziness, muscle  pain, back pain, depression, diarrhea and abdominal discomfort are all mentioned in review of systems.  All other systems reviewed and are negative.  EKGs/Labs/Other Studies Reviewed:    The following studies were reviewed today: Last imaging of the heart was 2017.  Vascular ultrasound with Evar duplex performed in May and reviewed by [facility].  EKG:  EKG is not ordered today.  The ekg reveals atrial fibrillation, ventricular response 52 bpm, nonspecific ST abnormality noted on tracing from September 2019.  This is not significantly different when compared to historical tracings.  Recent Labs: 01/03/2018: ALT 13 04/07/2018: BUN 20; Creatinine, Ser 0.97; Hemoglobin 13.0; Platelets 136; Potassium 4.8; Sodium 143  Recent Lipid Panel    Component Value Date/Time   CHOL 138 01/03/2018 1146   TRIG 86 01/03/2018 1146   HDL 53 01/03/2018 1146   CHOLHDL 2.6 01/03/2018 1146   LDLCALC 68 01/03/2018 1146    Physical Exam:    VS:  BP 124/68   Pulse (!) 56   Ht 5\' 8"  (1.727 m)   Wt 159 lb 12.8 oz (72.5 kg)   SpO2 93%   BMI 24.30 kg/m     Wt Readings from Last 3 Encounters:  07/05/18 159 lb 12.8 oz (72.5 kg)  05/09/18 158 lb 6.4 oz (71.8 kg)  01/03/18 159 lb 1.9 oz (72.2 kg)     GEN:  Well nourished, well developed in no acute distress HEENT: Normal NECK: No JVD. LYMPHATICS: No lymphadenopathy CARDIAC: IIRR, no murmur, no  gallop, trace bilateral ankle and shin edema.  Skin lichenification consistent with stasis dermatitis. VASCULAR: 2+ radial and carotids bilateral pulses.  No bruits. RESPIRATORY:  Clear to auscultation without rales, wheezing or rhonchi  ABDOMEN: Soft, non-tender, non-distended, No pulsatile mass, MUSCULOSKELETAL: No deformity  SKIN: Warm and dry NEUROLOGIC:  Alert and oriented x 3 PSYCHIATRIC:  Normal affect   ASSESSMENT:    1. Atherosclerosis of coronary artery bypass graft of native heart with other forms of angina pectoris ([place])   2. Chronic atrial fibrillation   3. Chronic systolic heart failure ([place])   4. Long term current use of anticoagulant therapy   5. AAA (abdominal aortic aneurysm) without rupture ([place])   6. PACEMAKER, PERMANENT-Medtronic    PLAN:    In order of problems listed above:  1. Stable without angina 2. Excellent rate control.  Tolerating chronic anticoagulation without difficulty. 3. Last LVEF based on echo was 40 to 45% 2017.  There is not an immediate need for repeat imaging. 4. Tolerating Eliquis without bleeding.  Creatinine and hemoglobin will be obtained today.  When he returns in 6 months, these labs will be repeated. 5. Followed by IV VS 6. Followed by EP  Encourage ambulation with care.  No changes in medical therapy at this time.   Medication Adjustments/Labs and Tests Ordered: Current medicines are reviewed at length with the patient today.  Concerns regarding medicines are outlined above.  Orders Placed This Encounter  Procedures  . CBC  . Basic metabolic panel   No orders of the defined types were placed in this encounter.   Patient Instructions  Medication Instructions:  Your physician recommends that you continue on your current medications as directed. Please refer to the Current Medication list given to you today.  If you need a refill on your cardiac medications before your next appointment, please call your pharmacy.   Lab  work: BMET and CBC today  If you have labs (blood work) drawn today and your  tests are completely normal, you will receive your results only by: [name] MyChart Message (if you have MyChart) OR . A paper copy in the mail If you have any lab test that is abnormal or we need to change your treatment, we will call you to review the results.  Testing/Procedures: None  Follow-Up: At [facility], you and your health needs are our priority.  As part of our continuing mission to provide you with exceptional heart care, we have created designated Provider Care Teams.  These Care Teams include your primary Cardiologist (physician) and Advanced Practice Providers (APPs -  Physician Assistants and Nurse Practitioners) who all work together to provide you with the care you need, when you need it. You will need a follow up appointment in 6 months.  Please call our office 2 months in advance to schedule this appointment.  You may see Sinclair Grooms, MD or one of the following Advanced Practice Providers on your designated Care Team:   Truitt Merle, NP Cecilie Kicks, NP . Kathyrn Drown, NP  Any Other Special Instructions Will Be Listed Below (If Applicable).       Signed, Sinclair Grooms, MD  07/05/2018 12:28 PM    [place]

## 2018-07-05 ENCOUNTER — Ambulatory Visit (INDEPENDENT_AMBULATORY_CARE_PROVIDER_SITE_OTHER): Payer: Medicare Other | Admitting: Interventional Cardiology

## 2018-07-05 ENCOUNTER — Encounter: Payer: Self-pay | Admitting: Interventional Cardiology

## 2018-07-05 VITALS — BP 124/68 | HR 56 | Ht 68.0 in | Wt 159.8 lb

## 2018-07-05 DIAGNOSIS — I25708 Atherosclerosis of coronary artery bypass graft(s), unspecified, with other forms of angina pectoris: Secondary | ICD-10-CM | POA: Diagnosis not present

## 2018-07-05 DIAGNOSIS — I714 Abdominal aortic aneurysm, without rupture, unspecified: Secondary | ICD-10-CM

## 2018-07-05 DIAGNOSIS — I5022 Chronic systolic (congestive) heart failure: Secondary | ICD-10-CM | POA: Diagnosis not present

## 2018-07-05 DIAGNOSIS — Z7901 Long term (current) use of anticoagulants: Secondary | ICD-10-CM

## 2018-07-05 DIAGNOSIS — Z950 Presence of cardiac pacemaker: Secondary | ICD-10-CM

## 2018-07-05 DIAGNOSIS — I482 Chronic atrial fibrillation, unspecified: Secondary | ICD-10-CM

## 2018-07-05 DIAGNOSIS — I255 Ischemic cardiomyopathy: Secondary | ICD-10-CM | POA: Diagnosis not present

## 2018-07-05 LAB — BASIC METABOLIC PANEL
BUN / CREAT RATIO: 21 (ref 10–24)
BUN: 19 mg/dL (ref 10–36)
CHLORIDE: 100 mmol/L (ref 96–106)
CO2: 25 mmol/L (ref 20–29)
Calcium: 9.3 mg/dL (ref 8.6–10.2)
Creatinine, Ser: 0.91 mg/dL (ref 0.76–1.27)
GFR calc Af Amer: 85 mL/min/{1.73_m2} (ref >59)
GFR calc non Af Amer: 73 mL/min/{1.73_m2} (ref >59)
GLUCOSE: 87 mg/dL (ref 65–99)
POTASSIUM: 4.3 mmol/L (ref 3.5–5.2)
SODIUM: 142 mmol/L (ref 134–144)

## 2018-07-05 LAB — CBC
Hematocrit: 34.7 % — ABNORMAL LOW (ref 37.5–51.0)
Hemoglobin: 12.1 g/dL — ABNORMAL LOW (ref 13.0–17.7)
MCH: 34.7 pg — AB (ref 26.6–33.0)
MCHC: 34.9 g/dL (ref 31.5–35.7)
MCV: 99 fL — ABNORMAL HIGH (ref 79–97)
PLATELETS: 124 10*3/uL — AB (ref 150–450)
RBC: 3.49 x10E6/uL — ABNORMAL LOW (ref 4.14–5.80)
RDW: 12.8 % (ref 12.3–15.4)
WBC: 8.6 10*3/uL (ref 3.4–10.8)

## 2018-07-05 NOTE — Patient Instructions (Signed)
Medication Instructions:  Your physician recommends that you continue on your current medications as directed. Please refer to the Current Medication list given to you today.  If you need a refill on your cardiac medications before your next appointment, please call your pharmacy.   Lab work: BMET and CBC today  If you have labs (blood work) drawn today and your tests are completely normal, you will receive your results only by: [name] MyChart Message (if you have MyChart) OR . A paper copy in the mail If you have any lab test that is abnormal or we need to change your treatment, we will call you to review the results.  Testing/Procedures: None  Follow-Up: At [facility], you and your health needs are our priority.  As part of our continuing mission to provide you with exceptional heart care, we have created designated Provider Care Teams.  These Care Teams include your primary Cardiologist (physician) and Advanced Practice Providers (APPs -  Physician Assistants and Nurse Practitioners) who all work together to provide you with the care you need, when you need it. You will need a follow up appointment in 6 months.  Please call our office 2 months in advance to schedule this appointment.  You may see Sinclair Grooms, MD or one of the following Advanced Practice Providers on your designated Care Team:   Truitt Merle, NP Cecilie Kicks, NP . Kathyrn Drown, NP  Any Other Special Instructions Will Be Listed Below (If Applicable).

## 2018-07-07 ENCOUNTER — Encounter: Payer: Self-pay | Admitting: Cardiology

## 2018-07-26 ENCOUNTER — Ambulatory Visit (INDEPENDENT_AMBULATORY_CARE_PROVIDER_SITE_OTHER): Payer: Medicare Other

## 2018-07-26 DIAGNOSIS — I482 Chronic atrial fibrillation, unspecified: Secondary | ICD-10-CM

## 2018-07-27 NOTE — Progress Notes (Signed)
Remote pacemaker transmission.   

## 2018-08-01 ENCOUNTER — Ambulatory Visit (INDEPENDENT_AMBULATORY_CARE_PROVIDER_SITE_OTHER): Payer: Medicare Other | Admitting: *Deleted

## 2018-08-01 DIAGNOSIS — I482 Chronic atrial fibrillation, unspecified: Secondary | ICD-10-CM | POA: Diagnosis not present

## 2018-08-01 DIAGNOSIS — I255 Ischemic cardiomyopathy: Secondary | ICD-10-CM | POA: Diagnosis not present

## 2018-08-01 DIAGNOSIS — Z5181 Encounter for therapeutic drug level monitoring: Secondary | ICD-10-CM | POA: Diagnosis not present

## 2018-08-01 NOTE — Progress Notes (Signed)
Pt who is a 84 yr old male who started on Eliquis  on 01/03/18, 5mg  BID. He recently saw [name] on 07/05/18 and weight at that visit was 72.5Kg. SCr was done at that visit and was 0.91. Pt denies any bleeding issues with Eliquis or cost. He states he gets his Eliquis from the [place] and it is very cost effective. Pt will follow up with his Cardiologist; Dr. Caryl Comes and Dr. [name].

## 2018-08-31 DIAGNOSIS — S51011D Laceration without foreign body of right elbow, subsequent encounter: Secondary | ICD-10-CM | POA: Diagnosis not present

## 2018-08-31 DIAGNOSIS — S20211A Contusion of right front wall of thorax, initial encounter: Secondary | ICD-10-CM | POA: Diagnosis not present

## 2018-08-31 DIAGNOSIS — S81011A Laceration without foreign body, right knee, initial encounter: Secondary | ICD-10-CM | POA: Diagnosis not present

## 2018-09-04 LAB — CUP PACEART REMOTE DEVICE CHECK
Date Time Interrogation Session: 20191210211223
Implantable Lead Implant Date: 20080818
Implantable Lead Implant Date: 20080818
Implantable Lead Location: 753859
Implantable Lead Serial Number: 499409
Implantable Pulse Generator Implant Date: 20150320
Lead Channel Impedance Value: 440 Ohm
Lead Channel Impedance Value: 67 Ohm
Lead Channel Pacing Threshold Amplitude: 1.125 V
Lead Channel Pacing Threshold Pulse Width: 0.4 ms
MDC IDC LEAD LOCATION: 753860
MDC IDC LEAD SERIAL: 602985
MDC IDC MSMT BATTERY IMPEDANCE: 531 Ohm
MDC IDC MSMT BATTERY REMAINING LONGEVITY: 93 mo
MDC IDC MSMT BATTERY VOLTAGE: 2.78 V
MDC IDC SET LEADCHNL RV PACING AMPLITUDE: 2.5 V
MDC IDC SET LEADCHNL RV PACING PULSEWIDTH: 0.4 ms
MDC IDC SET LEADCHNL RV SENSING SENSITIVITY: 5.6 mV
MDC IDC STAT BRADY RV PERCENT PACED: 13 %

## 2018-09-19 DIAGNOSIS — N398 Other specified disorders of urinary system: Secondary | ICD-10-CM | POA: Diagnosis not present

## 2018-09-19 DIAGNOSIS — N401 Enlarged prostate with lower urinary tract symptoms: Secondary | ICD-10-CM | POA: Diagnosis not present

## 2018-10-25 ENCOUNTER — Ambulatory Visit (INDEPENDENT_AMBULATORY_CARE_PROVIDER_SITE_OTHER): Payer: Medicare Other | Admitting: *Deleted

## 2018-10-25 DIAGNOSIS — I495 Sick sinus syndrome: Secondary | ICD-10-CM

## 2018-10-26 LAB — CUP PACEART REMOTE DEVICE CHECK
Battery Impedance: 608 Ohm
Implantable Lead Implant Date: 20080818
Implantable Lead Location: 753859
Implantable Lead Serial Number: 499409
Implantable Lead Serial Number: 602985
Lead Channel Impedance Value: 67 Ohm
Lead Channel Pacing Threshold Amplitude: 1.25 V
Lead Channel Pacing Threshold Pulse Width: 0.4 ms
Lead Channel Setting Pacing Amplitude: 2.5 V
Lead Channel Setting Sensing Sensitivity: 5.6 mV
MDC IDC LEAD IMPLANT DT: 20080818
MDC IDC LEAD LOCATION: 753860
MDC IDC MSMT BATTERY REMAINING LONGEVITY: 87 mo
MDC IDC MSMT BATTERY VOLTAGE: 2.78 V
MDC IDC MSMT LEADCHNL RV IMPEDANCE VALUE: 464 Ohm
MDC IDC PG IMPLANT DT: 20150320
MDC IDC SESS DTM: 20200310195806
MDC IDC SET LEADCHNL RV PACING PULSEWIDTH: 0.4 ms
MDC IDC STAT BRADY RV PERCENT PACED: 12 %

## 2018-10-27 ENCOUNTER — Telehealth: Payer: Self-pay | Admitting: *Deleted

## 2018-10-27 NOTE — Telephone Encounter (Signed)
   [facility] Pre-operative Risk Assessment    Request for surgical clearance:  1. What type of surgery is being performed? CERVICAL FSI    2. When is this surgery scheduled? 11/10/18   3. What type of clearance is required (medical clearance vs. Pharmacy clearance to hold med vs. Both)? BOTH  4. Are there any medications that need to be held prior to surgery and how long?ELIQUIS 3 DAYS PRIOR   5. Practice name and name of physician performing surgery? [facility];    6. What is your office phone number [phone]    7.   What is your office fax number [phone]  8.   Anesthesia type (None, local, MAC, general) ? NONE LISTED   Julaine Hua 10/27/2018, 3:12 PM  _________________________________________________________________   (provider comments below)

## 2018-10-27 NOTE — Telephone Encounter (Signed)
Patient with diagnosis of Afib on Eliquis for anticoagulation.    Procedure: cervical ESI Date of procedure: 11/10/18  CHADS2-VASc score of  5 (CHF, HTN, AGE, DM2, stroke/tia x 2, CAD, AGE, male)  CrCl 69ml/min  Per office protocol, patient can hold Eliquis for 3 days prior to procedure.

## 2018-10-28 NOTE — Telephone Encounter (Signed)
   Primary Cardiologist: Sinclair Grooms, MD  Chart reviewed as part of pre-operative protocol coverage. Given past medical history and time since last visit, based on ACC/AHA guidelines, Adam [name] would be at acceptable risk for the planned procedure without further cardiovascular testing.   Per office protocol, patient can hold Eliquis for 3 days prior to procedure.    I will route this recommendation to the requesting party via Epic fax function and remove from pre-op pool.  Please call with questions.  Lyda Jester, PA-C 10/28/2018, 10:21 AM

## 2018-11-01 ENCOUNTER — Encounter: Payer: Self-pay | Admitting: Cardiology

## 2018-11-01 NOTE — Progress Notes (Signed)
Remote pacemaker transmission.   

## 2018-11-03 DIAGNOSIS — Z961 Presence of intraocular lens: Secondary | ICD-10-CM | POA: Diagnosis not present

## 2018-11-03 DIAGNOSIS — H401132 Primary open-angle glaucoma, bilateral, moderate stage: Secondary | ICD-10-CM | POA: Diagnosis not present

## 2018-11-03 DIAGNOSIS — H40053 Ocular hypertension, bilateral: Secondary | ICD-10-CM | POA: Diagnosis not present

## 2018-11-03 DIAGNOSIS — H353132 Nonexudative age-related macular degeneration, bilateral, intermediate dry stage: Secondary | ICD-10-CM | POA: Diagnosis not present

## 2018-11-03 DIAGNOSIS — H43813 Vitreous degeneration, bilateral: Secondary | ICD-10-CM | POA: Diagnosis not present

## 2018-11-20 ENCOUNTER — Telehealth: Payer: Self-pay | Admitting: Interventional Cardiology

## 2018-11-20 NOTE — Telephone Encounter (Signed)
Okay to see Adam [name]. Tele or Phone OV

## 2018-11-22 ENCOUNTER — Telehealth: Payer: Self-pay

## 2018-11-22 NOTE — Telephone Encounter (Signed)
Late Entry: spoke with pt 11/21/18 and he was agreeable to a video visit with Dr. [name] on 11/28/2018.  Advised someone would be reaching out to him to get everything set up.  Pt verbalized understanding and was appreciative for call.

## 2018-11-22 NOTE — Telephone Encounter (Signed)
Spoke to pt and his Wife. He does not have MyChart and is not interested. He would rather do a telephone visit at this time. Pt was advised to have vital signs and medication list ready for when I call prior to his appt time. Pt agreed to this telephone visit.       Virtual Visit Pre-Appointment Phone Call  Steps For Call:  1. Confirm consent - "In the setting of the current Covid19 crisis, you are scheduled for a (phone or video) visit with your provider on (date) at (time).  Just as we do with many in-office visits, in order for you to participate in this visit, we must obtain consent.  If you'd like, I can send this to your mychart (if signed up) or email for you to review.  Otherwise, I can obtain your verbal consent now.  All virtual visits are billed to your insurance company just like a normal visit would be.  By agreeing to a virtual visit, we'd like you to understand that the technology does not allow for your provider to perform an examination, and thus may limit your provider's ability to fully assess your condition.  Finally, though the technology is pretty good, we cannot assure that it will always work on either your or our end, and in the setting of a video visit, we may have to convert it to a phone-only visit.  In either situation, we cannot ensure that we have a secure connection.  Are you willing to proceed?"  2. Give patient instructions for WebEx download to smartphone as below if video visit  3. Advise patient to be prepared with any vital sign or heart rhythm information, their current medicines, and a piece of paper and pen handy for any instructions they may receive the day of their visit  4. Inform patient they will receive a phone call 15 minutes prior to their appointment time (may be from unknown caller ID) so they should be prepared to answer  5. Confirm that appointment type is correct in Epic appointment notes (video vs telephone)    TELEPHONE CALL NOTE  Adam [name] has been deemed a candidate for a follow-up tele-health visit to limit community exposure during the Covid-19 pandemic. I spoke with the patient via phone to ensure availability of phone/video source, confirm preferred email & phone number, and discuss instructions and expectations.  I reminded Adam [name] to be prepared with any vital sign and/or heart rhythm information that could potentially be obtained via home monitoring, at the time of his visit. I reminded Adam [name] to expect a phone call at the time of his visit if his visit.  Did the patient verbally acknowledge consent to treatment? Yes  Adam [name], [place] 11/22/2018 4:53 PM   DOWNLOADING THE White  - If Apple, go to [employer] and type in WebEx in the search bar. [place] [employer], the blue/green circle. The app is free but as with any other app downloads, their phone may require them to verify saved payment information or Apple password. The patient does NOT have to create an account.  - If Android, ask patient to go to [employer] and type in WebEx in the search bar. [place] [employer], the blue/green circle. The app is free but as with any other app downloads, their phone may require them to verify saved payment information or Android password. The patient does NOT have to create an  account.   CONSENT FOR TELE-HEALTH VISIT - PLEASE REVIEW  I hereby voluntarily request, consent and authorize [facility] and its employed or contracted physicians, physician assistants, nurse practitioners or other licensed health care professionals (the Practitioner), to provide me with telemedicine health care services (the "Services") as deemed necessary by the treating Practitioner. I acknowledge and consent to receive the Services by the Practitioner via telemedicine. I understand that the telemedicine visit will involve communicating with the Practitioner through live  audiovisual communication technology and the disclosure of certain medical information by electronic transmission. I acknowledge that I have been given the opportunity to request an in-person assessment or other available alternative prior to the telemedicine visit and am voluntarily participating in the telemedicine visit.  I understand that I have the right to withhold or withdraw my consent to the use of telemedicine in the course of my care at any time, without affecting my right to future care or treatment, and that the Practitioner or I may terminate the telemedicine visit at any time. I understand that I have the right to inspect all information obtained and/or recorded in the course of the telemedicine visit and may receive copies of available information for a reasonable fee.  I understand that some of the potential risks of receiving the Services via telemedicine include:  [name] Delay or interruption in medical evaluation due to technological equipment failure or disruption; . Information transmitted may not be sufficient (e.g. poor resolution of images) to allow for appropriate medical decision making by the Practitioner; and/or  . In rare instances, security protocols could fail, causing a breach of personal health information.  Furthermore, I acknowledge that it is my responsibility to provide information about my medical history, conditions and care that is complete and accurate to the best of my ability. I acknowledge that Practitioner's advice, recommendations, and/or decision may be based on factors not within their control, such as incomplete or inaccurate data provided by me or distortions of diagnostic images or specimens that may result from electronic transmissions. I understand that the practice of medicine is not an exact science and that Practitioner makes no warranties or guarantees regarding treatment outcomes. I acknowledge that I will receive a copy of this consent concurrently upon  execution via email to the email address I last provided but may also request a printed copy by calling the office of [place].    I understand that my insurance will be billed for this visit.   I have read or had this consent read to me. . I understand the contents of this consent, which adequately explains the benefits and risks of the Services being provided via telemedicine.  . I have been provided ample opportunity to ask questions regarding this consent and the Services and have had my questions answered to my satisfaction. . I give my informed consent for the services to be provided through the use of telemedicine in my medical care  By participating in this telemedicine visit I agree to the above.

## 2018-11-27 NOTE — Progress Notes (Signed)
Virtual Visit via Video Note   This visit type was conducted due to national recommendations for restrictions regarding the COVID-19 Pandemic (e.g. social distancing) in an effort to limit this patient's exposure and mitigate transmission in our community.  Due to his co-morbid illnesses, this patient is at least at moderate risk for complications without adequate follow up.  This format is felt to be most appropriate for this patient at this time.  All issues noted in this document were discussed and addressed.  A limited physical exam was performed with this format.  Please refer to the patient's chart for his consent to telehealth for [facility].   Evaluation Performed:  Follow-up visit  Date:  11/28/2018   ID:  Adam [name], DOB [birth year], [name] [id number]  Patient Location: Home  Provider Location: Office  PCP:  [name], MD  Cardiologist:  Sinclair Grooms, MD  Electrophysiologist:  None   Chief Complaint:  CAD/AF  History of Present Illness:    Adam [name] is a 85 y.o. male who presents via audio/video conferencing for a telehealth visit today.    He a hx of ofischemic cardiomyopathy, permanent pacemaker, chronic systolic heart failure, chronic atrial fibrillation, CAD with bypass graft failure (saphenous vein graft to the obtuse marginal), hypertension, chronic anticoagulation therapy, hypertension, and hyperlipidemia. EF is 35% per echo from 03/2016.  He is doing well.  He denies angina.  No palpitations or racing heart.  No episodes of syncope.  He does have stable lower extremity edema.  He does no blood in his urine or stool.  He has not needed to use nitroglycerin.  Overall he feels well.  The patient does not have symptoms concerning for COVID-19 infection (fever, chills, cough, or new shortness of breath).    Past Medical History:  Diagnosis Date  . AAA (abdominal aortic aneurysm) ([place])   . Anginal pain ([place])   . Arthritis   . CAD (coronary  artery disease)    HANK Annali Lybrand IS CARDIO  . CHF (congestive heart failure) ([place])   . Chronic anticoagulation   . Diverticulosis   . GERD (gastroesophageal reflux disease)   . Hyperlipidemia   . Hypertension   . LBP (low back pain)   . Permanent atrial fibrillation    decision made at [facility]  . Presence of permanent cardiac pacemaker   . S/P CABG (coronary artery bypass graft)   . Shortness of breath dyspnea   . Sleep apnea    Past Surgical History:  Procedure Laterality Date  . CARDIOVERSION  06/30/2012   Procedure: CARDIOVERSION;  Surgeon: Sinclair Grooms, MD;  Location: [facility] ENDOSCOPY;  Service: Cardiovascular;  Laterality: N/A;  . CARDIOVERSION N/A 11/23/2013   Procedure: CARDIOVERSION;  Surgeon: Sinclair Grooms, MD;  Location: [place];  Service: Cardiovascular;  Laterality: N/A;  . CATARACT EXTRACTION W/ INTRAOCULAR LENS  IMPLANT, BILATERAL    . CORONARY ARTERY BYPASS GRAFT  Feb 1989   "CABG X6"  . ENDOVASCULAR STENT INSERTION  07/28/2011   Procedure: ENDOVASCULAR STENT GRAFT INSERTION;  Surgeon: Hinda Lenis, MD;  Location: [place];  Service: Vascular;  Laterality: N/A;  Pt. on Coumadin/ instructed to hold 5 days prior to procedure  . EYE SURGERY    . INGUINAL HERNIA REPAIR Right 01/04/2015  . INGUINAL HERNIA REPAIR Right 01/04/2015   Procedure:  OPEN REPAIR RIGHT INGUNIAL HERNIA;  Surgeon: Fanny Skates, MD;  Location: [place];  Service: General;  Laterality: Right;  .  INSERT / REPLACE / REMOVE PACEMAKER  July 2008  . INSERTION OF MESH Right 01/04/2015   Procedure: INSERTION OF MESH;  Surgeon: Fanny Skates, MD;  Location: [place];  Service: General;  Laterality: Right;  . JOINT REPLACEMENT  1995 & 1996   Bilateral total knee replacements  . LEFT HEART CATHETERIZATION WITH CORONARY ANGIOGRAM N/A 03/28/2014   Procedure: LEFT HEART CATHETERIZATION WITH CORONARY ANGIOGRAM;  Surgeon: Sinclair Grooms, MD;  Location: [place] CATH LAB;  Service: Cardiovascular;  Laterality:  N/A;  . PERMANENT PACEMAKER GENERATOR CHANGE N/A 11/03/2013   Procedure: PERMANENT PACEMAKER GENERATOR CHANGE;  Surgeon: [name], MD;  Location: [facility] CATH LAB;  Service: Cardiovascular;  Laterality: N/A;  . TONSILLECTOMY       Current Meds  Medication Sig  . acetaminophen (TYLENOL 8 HOUR ARTHRITIS PAIN) 650 MG CR tablet Take 650 mg by mouth every 8 (eight) hours as needed for pain.  [name] apixaban (ELIQUIS) 5 MG TABS tablet Take 5 mg by mouth 2 (two) times daily.  [name] atorvastatin (LIPITOR) 10 MG tablet Take 1 tablet (10 mg total) by mouth every evening.  . Cholecalciferol (VITAMIN D) 2000 UNITS CAPS Take 2,000 Units by mouth daily.  . furosemide (LASIX) 40 MG tablet Take 2 tablets (80 mg total) by mouth every other day. TAKE ONE AND ONE-HALF TABLETS (60 MG TOTAL) BY MOUTH DAILY.  [name] latanoprost (XALATAN) 0.005 % ophthalmic solution Place 1 drop into both eyes at bedtime.  . Multiple Vitamins-Minerals (PRESERVISION AREDS 2+MULTI VIT PO) Take 1 capsule by mouth 2 (two) times daily.   . nitroGLYCERIN (NITROSTAT) 0.4 MG SL tablet Place 1 tablet (0.4 mg total) under the tongue every 5 (five) minutes as needed. For chest pain  . Omega-3 Fatty Acids (FISH OIL) 1000 MG CPDR Take 1 capsule by mouth daily.   . silodosin (RAPAFLO) 8 MG CAPS capsule Take 8 mg by mouth at bedtime.      Allergies:   Amlodipine; Lisinopril; Morphine; and Amiodarone   Social History   Tobacco Use  . Smoking status: Former Smoker    Years: 3.00    Types: Cigarettes    Last attempt to quit: 08/17/1948    Years since quitting: 70.3  . Smokeless tobacco: Never Used  Substance Use Topics  . Alcohol use: No  . Drug use: No     Family Hx: The patient's family history includes Cancer in his sister; Heart attack in his father; Heart disease in his father and sister; Heart failure in his mother; Other in his sister; Stroke in his mother; Varicose Veins in his mother.  ROS:   Please see the history of present illness.     He has numbness and tingling in his lower extremities.  He has chronic low back pain. All other systems reviewed and are negative.   Prior CV studies:   The following studies were reviewed today:  Pacemaker follow-up data has been personally reviewed.  No abnormalities noted.  Labs/Other Tests and Data Reviewed:    EKG:  An ECG dated 05/09/2012 was personally reviewed today and demonstrated:  Atrial fibrillation with slow ventricular response.  Nonspecific ST-T abnormality.  Recent Labs: 01/03/2018: ALT 13 07/05/2018: BUN 19; Creatinine, Ser 0.91; Hemoglobin 12.1; Platelets 124; Potassium 4.3; Sodium 142   Recent Lipid Panel Lab Results  Component Value Date/Time   CHOL 138 01/03/2018 11:46 AM   TRIG 86 01/03/2018 11:46 AM   HDL 53 01/03/2018 11:46 AM   CHOLHDL 2.6 01/03/2018 11:46  AM   LDLCALC 68 01/03/2018 11:46 AM    Wt Readings from Last 3 Encounters:  11/28/18 155 lb (70.3 kg)  07/05/18 159 lb 12.8 oz (72.5 kg)  05/09/18 158 lb 6.4 oz (71.8 kg)     Objective:    Vital Signs:  BP 137/64   Pulse (!) 59   Ht 5\' 8"  (1.727 m)   Wt 155 lb (70.3 kg)   BMI 23.57 kg/m    Well nourished, well developed male in no acute distress. Elderly and frail appearing.  No evidence of cyanosis.  Moderate jugular vein distention.  Mild bilateral lower extremity edema.  ASSESSMENT & PLAN:    1. Sinus brady-tachy syndrome ([place])   2. Atherosclerosis of coronary artery bypass graft of native heart with other forms of angina pectoris (HCC)   3. Permanent atrial fibrillation   4. Chronic systolic heart failure ([place])   5. Long term current use of anticoagulant therapy   6. Educated About Covid-19 Virus Infection    PLAN according to problems above:  1. Patient is stable from cardiac standpoint and has not had syncope.  Normal pacemaker function. 2. No anginal symptoms or nitroglycerin requirement.  Encouraged safe physical activity. 3. Chronic atrial fibrillation is present.  On  anticoagulation therapy to prevent embolic events.  The CV stroke risk score is greater than 4. 4. He has chronic volume excess as denoted by lower extremity edema.  This is unchanged.  This is mostly due to right-sided pressure/volume overload.  No current symptoms to suggest pulmonary congestion. 5. Continue apixaban at current dose.  On next office visit will need a basic metabolic panel.  1.    COVID-19 Education: The signs and symptoms of COVID-19 were discussed with the patient and how to seek care for testing (follow up with PCP or arrange E-visit).  The importance of social distancing was discussed today.  Time:   Today, I have spent 20  minutes with the patient with telehealth technology discussing the above problems.     Medication Adjustments/Labs and Tests Ordered: Current medicines are reviewed at length with the patient today.  Concerns regarding medicines are outlined above.  Tests Ordered: No orders of the defined types were placed in this encounter.  Medication Changes: No orders of the defined types were placed in this encounter.   Disposition:  Follow up in 6 month(s)  Signed, Sinclair Grooms, MD  11/28/2018 10:35 AM    [place]

## 2018-11-28 ENCOUNTER — Telehealth (INDEPENDENT_AMBULATORY_CARE_PROVIDER_SITE_OTHER): Payer: Medicare Other | Admitting: Interventional Cardiology

## 2018-11-28 ENCOUNTER — Encounter: Payer: Self-pay | Admitting: Interventional Cardiology

## 2018-11-28 ENCOUNTER — Ambulatory Visit: Payer: Medicare Other | Admitting: Interventional Cardiology

## 2018-11-28 ENCOUNTER — Other Ambulatory Visit: Payer: Self-pay

## 2018-11-28 VITALS — BP 137/64 | HR 59 | Ht 68.0 in | Wt 155.0 lb

## 2018-11-28 DIAGNOSIS — Z7901 Long term (current) use of anticoagulants: Secondary | ICD-10-CM | POA: Diagnosis not present

## 2018-11-28 DIAGNOSIS — I25708 Atherosclerosis of coronary artery bypass graft(s), unspecified, with other forms of angina pectoris: Secondary | ICD-10-CM

## 2018-11-28 DIAGNOSIS — I4821 Permanent atrial fibrillation: Secondary | ICD-10-CM

## 2018-11-28 DIAGNOSIS — I495 Sick sinus syndrome: Secondary | ICD-10-CM | POA: Diagnosis not present

## 2018-11-28 DIAGNOSIS — I5022 Chronic systolic (congestive) heart failure: Secondary | ICD-10-CM

## 2018-11-28 DIAGNOSIS — Z7189 Other specified counseling: Secondary | ICD-10-CM

## 2018-11-28 NOTE — Patient Instructions (Signed)
Medication Instructions:   Your physician recommends that you continue on your current medications as directed. Please refer to the Current Medication list given to you today.  If you need a refill on your cardiac medications before your next appointment, please call your pharmacy.     Follow-Up:  Your physician wants you to follow-up in: 6 MONTHS WITH DR Gaspar Bidding WILL NEED AN EKG AT THAT VISIT-You will receive a reminder letter in the mail two months in advance. If you don't receive a letter, please call our office to schedule the follow-up appointment.

## 2018-12-13 ENCOUNTER — Other Ambulatory Visit: Payer: Self-pay

## 2018-12-13 ENCOUNTER — Ambulatory Visit: Payer: Medicare Other

## 2018-12-13 ENCOUNTER — Encounter: Payer: Self-pay | Admitting: Podiatry

## 2018-12-13 ENCOUNTER — Ambulatory Visit (INDEPENDENT_AMBULATORY_CARE_PROVIDER_SITE_OTHER): Payer: Medicare Other | Admitting: Podiatry

## 2018-12-13 VITALS — Temp 97.5°F

## 2018-12-13 DIAGNOSIS — I25708 Atherosclerosis of coronary artery bypass graft(s), unspecified, with other forms of angina pectoris: Secondary | ICD-10-CM | POA: Diagnosis not present

## 2018-12-13 DIAGNOSIS — M79676 Pain in unspecified toe(s): Secondary | ICD-10-CM | POA: Diagnosis not present

## 2018-12-13 DIAGNOSIS — D689 Coagulation defect, unspecified: Secondary | ICD-10-CM | POA: Diagnosis not present

## 2018-12-13 DIAGNOSIS — M778 Other enthesopathies, not elsewhere classified: Secondary | ICD-10-CM

## 2018-12-13 DIAGNOSIS — Q828 Other specified congenital malformations of skin: Secondary | ICD-10-CM

## 2018-12-13 DIAGNOSIS — B351 Tinea unguium: Secondary | ICD-10-CM | POA: Diagnosis not present

## 2018-12-13 DIAGNOSIS — M779 Enthesopathy, unspecified: Principal | ICD-10-CM

## 2018-12-13 NOTE — Progress Notes (Signed)
Subjective:  Patient ID: Adam [name], male    DOB: Sep 02, 1924,  [name]: [id number] HPI Chief Complaint  Patient presents with   Foot Pain    Plantar forefoot left - small, callused area x 2 months, tried medicated corn pad on right and helped but not the left, painful walking   New Patient (Initial Visit)    85 y.o. male presents with the above complaint.   ROS: Denies fever chills nausea vomiting muscle aches pains calf pain back pain chest pain shortness of breath.  Past Medical History:  Diagnosis Date   AAA (abdominal aortic aneurysm) (HCC)    Anginal pain (HCC)    Arthritis    CAD (coronary artery disease)    [name]   CHF (congestive heart failure) (HCC)    Chronic anticoagulation    Diverticulosis    GERD (gastroesophageal reflux disease)    Hyperlipidemia    Hypertension    LBP (low back pain)    Permanent atrial fibrillation    decision made at [facility] 2015   Presence of permanent cardiac pacemaker    S/P CABG (coronary artery bypass graft)    Shortness of breath dyspnea    Sleep apnea    Past Surgical History:  Procedure Laterality Date   CARDIOVERSION  06/30/2012   Procedure: CARDIOVERSION;  Surgeon: Sinclair Grooms, MD;  Location: [place];  Service: Cardiovascular;  Laterality: N/A;   CARDIOVERSION N/A 11/23/2013   Procedure: CARDIOVERSION;  Surgeon: Sinclair Grooms, MD;  Location: [place];  Service: Cardiovascular;  Laterality: N/A;   CATARACT EXTRACTION W/ INTRAOCULAR LENS  IMPLANT, BILATERAL     CORONARY ARTERY BYPASS GRAFT  Feb 1989   "CABG X6"   ENDOVASCULAR STENT INSERTION  07/28/2011   Procedure: ENDOVASCULAR STENT GRAFT INSERTION;  Surgeon: Hinda Lenis, MD;  Location: [place];  Service: Vascular;  Laterality: N/A;  Pt. on Coumadin/ instructed to hold 5 days prior to procedure   [place] Right 01/04/2015   INGUINAL HERNIA REPAIR Right 01/04/2015   Procedure:  [place];  Surgeon: Fanny Skates, MD;  Location: [place];  Service: General;  Laterality: Right;   INSERT / REPLACE / REMOVE PACEMAKER  July 2008   INSERTION OF MESH Right 01/04/2015   Procedure: INSERTION OF MESH;  Surgeon: Fanny Skates, MD;  Location: [place];  Service: General;  Laterality: Right;   [place]   Bilateral total knee replacements   LEFT HEART CATHETERIZATION WITH CORONARY ANGIOGRAM N/A 03/28/2014   Procedure: LEFT HEART CATHETERIZATION WITH CORONARY ANGIOGRAM;  Surgeon: Sinclair Grooms, MD;  Location: [facility] CATH LAB;  Service: Cardiovascular;  Laterality: N/A;   PERMANENT PACEMAKER GENERATOR CHANGE N/A 11/03/2013   Procedure: PERMANENT PACEMAKER GENERATOR CHANGE;  Surgeon: [name], MD;  Location: [facility] CATH LAB;  Service: Cardiovascular;  Laterality: N/A;   TONSILLECTOMY      Current Outpatient Medications:    acetaminophen (TYLENOL 8 HOUR ARTHRITIS PAIN) 650 MG CR tablet, Take 650 mg by mouth every 8 (eight) hours as needed for pain., Disp: , Rfl:    apixaban (ELIQUIS) 5 MG TABS tablet, Take 5 mg by mouth 2 (two) times daily., Disp: , Rfl:    atorvastatin (LIPITOR) 10 MG tablet, Take 1 tablet (10 mg total) by mouth every evening., Disp: 90 tablet, Rfl: 2   Cholecalciferol (VITAMIN D) 2000 UNITS CAPS, Take 2,000 Units  by mouth daily., Disp: , Rfl:    furosemide (LASIX) 40 MG tablet, Take 2 tablets (80 mg total) by mouth every other day. TAKE ONE AND ONE-HALF TABLETS (60 MG TOTAL) BY MOUTH DAILY., Disp: 135 tablet, Rfl: 3   latanoprost (XALATAN) 0.005 % ophthalmic solution, Place 1 drop into both eyes at bedtime., Disp: , Rfl:    Multiple Vitamins-Minerals (PRESERVISION AREDS 2+MULTI VIT PO), Take 1 capsule by mouth 2 (two) times daily. , Disp: , Rfl:    nitroGLYCERIN (NITROSTAT) 0.4 MG SL tablet, Place 1 tablet (0.4 mg total) under the tongue every 5 (five) minutes as needed. For chest pain, Disp: 25 tablet, Rfl: 5    Omega-3 Fatty Acids (FISH OIL) 1000 MG CPDR, Take 1 capsule by mouth daily. , Disp: , Rfl:    silodosin (RAPAFLO) 8 MG CAPS capsule, Take 8 mg by mouth at bedtime. , Disp: , Rfl:   Allergies  Allergen Reactions   Amlodipine Other (See Comments)   Lisinopril     Elevated potassium   Morphine Nausea Only   Amiodarone Anxiety and Rash   Review of Systems Objective:   Vitals:   12/13/18 1338  Temp: (!) 97.5 F (36.4 C)    General: Well developed, nourished, in no acute distress, alert and oriented x3   Dermatological: Skin is warm, dry and supple bilateral. Nails x 10 are well maintained; remaining integument appears unremarkable at this time. There are no open sores, no preulcerative lesions, no rash or signs of infection present.  Vascular: Dorsalis Pedis artery and Posterior Tibial artery pedal pulses are 2/4 bilateral with immedate capillary fill time. Pedal hair growth present. No varicosities and no lower extremity edema present bilateral.   Neruologic: Grossly intact via light touch bilateral. Vibratory intact via tuning fork bilateral. Protective threshold with Semmes Wienstein monofilament intact to all pedal sites bilateral. Patellar and Achilles deep tendon reflexes 2+ bilateral. No Babinski or clonus noted bilateral.   Musculoskeletal: No gross boney pedal deformities bilateral. No pain, crepitus, or limitation noted with foot and ankle range of motion bilateral. Muscular strength 5/5 in all groups tested bilateral.  Gait: Unassisted, Nonantalgic.    Radiographs:  None taken  Assessment & Plan:   Assessment: Painful elongated clinically mycotic nails and fat pad atrophy forefoot with metatarsalgia bilaterally.  Plan: Discussed etiology pathology conservative versus surgical therapies.  At this point performed a nail debridement 1 through 5 bilaterally and placed him in a single silicone forefoot pad left.     Eustacio Ellen T. [place], [place]

## 2018-12-16 ENCOUNTER — Telehealth: Payer: Self-pay | Admitting: Interventional Cardiology

## 2018-12-16 NOTE — Telephone Encounter (Signed)
Spoke with wife and made her aware of recommendations.

## 2018-12-16 NOTE — Telephone Encounter (Signed)
Wife is calling because she says her husband is supposed to get an injection next week and Dr Nelva Bush states he has not heard from our office in regards to him going off his Eliquis.

## 2018-12-16 NOTE — Telephone Encounter (Signed)
   Primary Cardiologist: Sinclair Grooms, MD  Chart reviewed as part of pre-operative protocol coverage. We did not receive a formal request for preoperative clearance. Pt's wife states that the patient is scheduled for a back injection and would like recommendations on holding eliquis.   Per our pharmacy staff: Pt takes Eliquis for afib with CHADS2VASc score of 5 (age x2, CHF, HTN, CAD). SCr 0.91, CrCl 60mL/min. Ok to hold Eliquis for 3 days prior to spinal procedure.   I will route this recommendation to the requesting party via Epic fax function and remove from pre-op pool.  Please call with questions.  [place], PA 12/16/2018, 4:09 PM

## 2018-12-16 NOTE — Telephone Encounter (Signed)
Pt takes Eliquis for afib with CHADS2VASc score of 5 (age x2, CHF, HTN, CAD). SCr 0.91, CrCl 38mL/min. Ok to hold Eliquis for 3 days prior to spinal procedure.

## 2018-12-16 NOTE — Telephone Encounter (Signed)
Spoke with wife and she states pt is to have a Cervical FSI on Thursday, May 7th at [facility] with Dr. Nelva Bush.  States Dr. Nelva Bush office hadn't heard from [place].  Advised I don't see anything in the chart where we received anything needing clearance.  They want pt to hold Eliquis for 3 days.  I do see where he was cleared for injection back in March.  Advised I will send to my preop and pharmacy team to address.   Fax # for Dr. Nelva Bush is [phone] Phone # is [phone]

## 2018-12-22 ENCOUNTER — Telehealth: Payer: Self-pay

## 2018-12-22 ENCOUNTER — Other Ambulatory Visit: Payer: Self-pay | Admitting: Interventional Cardiology

## 2018-12-22 DIAGNOSIS — M47816 Spondylosis without myelopathy or radiculopathy, lumbar region: Secondary | ICD-10-CM | POA: Diagnosis not present

## 2018-12-22 NOTE — Telephone Encounter (Signed)
Please comment on holding eliquis.

## 2018-12-22 NOTE — Telephone Encounter (Signed)
   [facility] Pre-operative Risk Assessment    Request for surgical clearance:  1. What type of surgery is being performed? CERVICAL ESI   2. When is this surgery scheduled? TBD   3. Are there any medications that need to be held prior to surgery and how long?ELIQUIS X 3 DAYS   4. Practice name and name of physician performing surgery? [facility]   5. What is your office phone and fax number? P:[phone]; F:[phone]   6. Anesthesia type (None, local, MAC, general) ? LOCAL   Leontyne Manville 12/22/2018, 11:59 AM  _________________________________________________________________   (provider comments below)

## 2018-12-22 NOTE — Telephone Encounter (Signed)
   Primary Cardiologist: Sinclair Grooms, MD  Chart reviewed as part of pre-operative protocol coverage.   Adam [name] was last seen on 11/28/18  by Dr. [name].    [facility] has requested guidance on holding eliquis for a Cervical ESI.  Per our pharmacy staff: Patient with diagnosis of afib on Eliquis for anticoagulation.    Procedure: CERVICAL ESI Date of procedure: TBD  CHADS2-VASc score of  5 (CHF, HTN, AGE, DM2, stroke/tia x 2, CAD, AGE, male)  CrCl 31ml/min  Per office protocol, patient can hold Eliquis for 3 days prior to procedure  I will route this recommendation to the requesting party via [place] fax function and remove from pre-op pool.  Please call with questions.  [place], PA 12/22/2018, 2:13 PM

## 2018-12-22 NOTE — Telephone Encounter (Signed)
Patient with diagnosis of afib on Eliquis for anticoagulation.    Procedure: CERVICAL ESI  Date of procedure: TBD  CHADS2-VASc score of  5 (CHF, HTN, AGE, DM2, stroke/tia x 2, CAD, AGE, male)  CrCl 2ml/min  Per office protocol, patient can hold Eliquis for 3 days prior to procedure.

## 2018-12-23 ENCOUNTER — Telehealth: Payer: Self-pay | Admitting: Pharmacist Clinician (PhC)/ Clinical Pharmacy Specialist

## 2018-12-23 NOTE — Telephone Encounter (Signed)
Question addressed by Tommy Medal (pharmacy) in telephone note 12/23/2018. Appreciate her assistance.

## 2018-12-23 NOTE — Telephone Encounter (Signed)
Follow up   Patient's wife wants to know when will he should go back on eliquis after the injection? Please advise.

## 2018-12-23 NOTE — Telephone Encounter (Signed)
Wife called, states patient had injection yesterday and was not told when to resume Eliquis.  Also she states that he has been noting edema and aching in his legs, but it appeared to improve once he stopped the Eliquis (held x 4 days for injection).  Wanted to know if he should come in to get INR to be sure he has been dosed properly.  Advised that he resume Eliquis this morning, as he was not told to hold it after the procedure by that MD.  Explained that checking an INR does not work with Eliquis and that his dose is currently correct based on age, weight, SCR (91, 71.8 kg, 0.91 ml/min).  Suggested that since that side effect not normally associated with Eliquis, he resume the medication today and should the swelling and aches come back, he should contact our office.  Wife voiced understanding.

## 2019-01-16 DIAGNOSIS — H401132 Primary open-angle glaucoma, bilateral, moderate stage: Secondary | ICD-10-CM | POA: Diagnosis not present

## 2019-01-17 DIAGNOSIS — H401132 Primary open-angle glaucoma, bilateral, moderate stage: Secondary | ICD-10-CM | POA: Insufficient documentation

## 2019-01-24 ENCOUNTER — Ambulatory Visit (INDEPENDENT_AMBULATORY_CARE_PROVIDER_SITE_OTHER): Payer: Medicare Other | Admitting: *Deleted

## 2019-01-24 DIAGNOSIS — I4891 Unspecified atrial fibrillation: Secondary | ICD-10-CM

## 2019-01-24 DIAGNOSIS — I495 Sick sinus syndrome: Secondary | ICD-10-CM | POA: Diagnosis not present

## 2019-01-25 ENCOUNTER — Telehealth: Payer: Self-pay

## 2019-01-25 LAB — CUP PACEART REMOTE DEVICE CHECK
Battery Impedance: 660 Ohm
Battery Remaining Longevity: 84 mo
Battery Voltage: 2.77 V
Brady Statistic RV Percent Paced: 14 %
Date Time Interrogation Session: 20200610155301
Implantable Lead Implant Date: 20080818
Implantable Lead Implant Date: 20080818
Implantable Lead Location: 753859
Implantable Lead Location: 753860
Implantable Lead Model: 4469
Implantable Lead Model: 4470
Implantable Lead Serial Number: 499409
Implantable Lead Serial Number: 602985
Implantable Pulse Generator Implant Date: 20150320
Lead Channel Impedance Value: 501 Ohm
Lead Channel Impedance Value: 67 Ohm
Lead Channel Pacing Threshold Amplitude: 1.25 V
Lead Channel Pacing Threshold Pulse Width: 0.4 ms
Lead Channel Setting Pacing Amplitude: 2.5 V
Lead Channel Setting Pacing Pulse Width: 0.4 ms
Lead Channel Setting Sensing Sensitivity: 5.6 mV

## 2019-01-25 NOTE — Telephone Encounter (Signed)
Left message for patient to remind of missed remote transmission.  

## 2019-01-30 DIAGNOSIS — Z85828 Personal history of other malignant neoplasm of skin: Secondary | ICD-10-CM | POA: Diagnosis not present

## 2019-01-30 DIAGNOSIS — C44729 Squamous cell carcinoma of skin of left lower limb, including hip: Secondary | ICD-10-CM | POA: Diagnosis not present

## 2019-01-30 DIAGNOSIS — L570 Actinic keratosis: Secondary | ICD-10-CM | POA: Diagnosis not present

## 2019-02-01 NOTE — Progress Notes (Signed)
Remote pacemaker transmission.   

## 2019-02-08 DIAGNOSIS — E785 Hyperlipidemia, unspecified: Secondary | ICD-10-CM | POA: Diagnosis not present

## 2019-02-08 DIAGNOSIS — I10 Essential (primary) hypertension: Secondary | ICD-10-CM | POA: Diagnosis not present

## 2019-02-08 DIAGNOSIS — I4891 Unspecified atrial fibrillation: Secondary | ICD-10-CM | POA: Diagnosis not present

## 2019-02-08 DIAGNOSIS — Z0000 Encounter for general adult medical examination without abnormal findings: Secondary | ICD-10-CM | POA: Diagnosis not present

## 2019-02-08 DIAGNOSIS — Z1389 Encounter for screening for other disorder: Secondary | ICD-10-CM | POA: Diagnosis not present

## 2019-02-08 DIAGNOSIS — E039 Hypothyroidism, unspecified: Secondary | ICD-10-CM | POA: Diagnosis not present

## 2019-02-13 DIAGNOSIS — I10 Essential (primary) hypertension: Secondary | ICD-10-CM | POA: Diagnosis not present

## 2019-02-13 DIAGNOSIS — E039 Hypothyroidism, unspecified: Secondary | ICD-10-CM | POA: Diagnosis not present

## 2019-02-13 DIAGNOSIS — E785 Hyperlipidemia, unspecified: Secondary | ICD-10-CM | POA: Diagnosis not present

## 2019-02-13 DIAGNOSIS — I4891 Unspecified atrial fibrillation: Secondary | ICD-10-CM | POA: Diagnosis not present

## 2019-02-28 ENCOUNTER — Encounter: Payer: Self-pay | Admitting: Podiatry

## 2019-02-28 ENCOUNTER — Other Ambulatory Visit: Payer: Self-pay

## 2019-02-28 ENCOUNTER — Ambulatory Visit (INDEPENDENT_AMBULATORY_CARE_PROVIDER_SITE_OTHER): Payer: Medicare Other | Admitting: Podiatry

## 2019-02-28 VITALS — Temp 98.7°F

## 2019-02-28 DIAGNOSIS — B351 Tinea unguium: Secondary | ICD-10-CM

## 2019-02-28 DIAGNOSIS — Q828 Other specified congenital malformations of skin: Secondary | ICD-10-CM

## 2019-02-28 DIAGNOSIS — D689 Coagulation defect, unspecified: Secondary | ICD-10-CM

## 2019-02-28 DIAGNOSIS — M79676 Pain in unspecified toe(s): Secondary | ICD-10-CM | POA: Diagnosis not present

## 2019-02-28 NOTE — Progress Notes (Signed)
He presents today for follow-up of his plantar forefoot left.  States is doing much better I am questioning whether have an ingrown.  Objective: Vital signs are stable he is alert oriented x3.  No reactive hyperkeratoses are noted.  This is completely resolved.  Toenails are thick dystrophic possibly mycotic hallux left.  Remainder of the toenails appear to be appear to be dystrophic.  Assessment: Nail dystrophy painful nails hallux and lesser nails left.  No reoccurrence of reactive hyperkeratotic lesion.  Plan: Debrided nails 1 through 5 left foot.

## 2019-03-09 ENCOUNTER — Telehealth: Payer: Self-pay | Admitting: Internal Medicine

## 2019-03-09 NOTE — Telephone Encounter (Signed)
New Message            Patient called to let us know that they have not recv'd a letter for a appointment, patient was told to call if they have not gotten a letter for an appt. Pls advise.

## 2019-03-09 NOTE — Telephone Encounter (Signed)
Spoke with wife, DPR on file.  Scheduled pt to see Dr. [name] in October.  She thanked me for the call.

## 2019-03-15 DIAGNOSIS — H6123 Impacted cerumen, bilateral: Secondary | ICD-10-CM | POA: Diagnosis not present

## 2019-03-15 DIAGNOSIS — H9193 Unspecified hearing loss, bilateral: Secondary | ICD-10-CM | POA: Diagnosis not present

## 2019-04-11 DIAGNOSIS — Z961 Presence of intraocular lens: Secondary | ICD-10-CM | POA: Diagnosis not present

## 2019-04-11 DIAGNOSIS — H401132 Primary open-angle glaucoma, bilateral, moderate stage: Secondary | ICD-10-CM | POA: Diagnosis not present

## 2019-04-17 DIAGNOSIS — M5136 Other intervertebral disc degeneration, lumbar region: Secondary | ICD-10-CM | POA: Diagnosis not present

## 2019-04-17 DIAGNOSIS — M48061 Spinal stenosis, lumbar region without neurogenic claudication: Secondary | ICD-10-CM | POA: Diagnosis not present

## 2019-04-21 ENCOUNTER — Telehealth: Payer: Self-pay

## 2019-04-21 DIAGNOSIS — I5022 Chronic systolic (congestive) heart failure: Secondary | ICD-10-CM

## 2019-04-21 NOTE — Telephone Encounter (Signed)
   [facility] Pre-operative Risk Assessment    Request for surgical clearance:  1. What type of surgery is being performed? LUMBAR ESI   2. When is this surgery scheduled? 05/04/19   3. What type of clearance is required (medical clearance vs. Pharmacy clearance to hold med vs. Both)? MEDICAL  4. Are there any medications that need to be held prior to surgery and how long? ELIQUIS X 3DAYS  5. Practice name and name of physician performing surgery? [facility], Suella Broad, MD   6. What is your office phone number [phone] EX: [phone]    9.   What is your office fax number [phone]  8.   Anesthesia type (None, local, MAC, general) ? NONE LISTED   Jacinta Shoe 04/21/2019, 4:39 PM  _________________________________________________________________   (provider comments below)

## 2019-04-25 NOTE — Telephone Encounter (Signed)
Patient with diagnosis of afib on Eliquis for anticoagulation.    Procedure: LUMBAR ESI  Date of procedure: 05/04/2019  CHADS2-VASc score of  5 (CHF, HTN, AGE, CAD, AGE)  CrCl 50 ml/min  Per office protocol, patient can hold Eliquis for 3 days prior to procedure.

## 2019-04-25 NOTE — Telephone Encounter (Signed)
Please comment on eliquis - previous recommendations on 12/22/18 was for 3 days.

## 2019-04-26 ENCOUNTER — Encounter: Payer: Medicare Other | Admitting: *Deleted

## 2019-04-27 NOTE — Telephone Encounter (Signed)
MESSAGE SENT TO [facility] SCHEDULER TO CALL PT AND SCHEDULE APPT

## 2019-04-27 NOTE — Telephone Encounter (Signed)
   Primary Cardiologist: Sinclair Grooms, MD  Chart reviewed as part of pre-operative protocol coverage. Patient was contacted 04/27/2019 in reference to pre-operative risk assessment for pending surgery as outlined below.  Adam [name] was last seen on 11/28/2018 by Dr. [name] via a telemedicine visit.  Since that day, Adam [name] has done fine from a cardiac standpoint. Main issues are ongoing back pain which has been intermittently relieved with spinal injections, as well as nocturia with a weak stream. He has had some increase in LE edema over the past few months and has been alternating lasix 60mg  and 80mg  qod for 3 months now. No complaints of SOB at rest or with activity. No complaints of chest pain. He can complete 4 METs without anginal complaints.   Therefore, based on ACC/AHA guidelines, the patient would be at acceptable risk for the planned procedure without further cardiovascular testing.   Per pharmacy recommendations, patient can hold eliquis 3 days prior to his upcoming spinal injection. Eliquis should be restarted as soon as he is cleared to do so by his surgeon.   I will route this recommendation to the requesting party via Epic fax function and remove from pre-op pool.  Please call with questions.  _________________________________________  Patient was recommended to increase his lasix to 80mg  daily. Patient will present for blood work 04/28/2019 for monitoring of his kidney function/electrolytes since adjusting his lasix (no labs since 06/2018). Patient is scheduled to see Dr. [name] 05/30/2019 for routine follow-up.    He was also recommended to contact his urologist to discuss BPH complaints.    Callback:  Please place patient on the schedule for lab work at [facility] for 04/28/2019.   Abigail Butts, PA-C 04/27/2019, 8:39 AM

## 2019-04-27 NOTE — Telephone Encounter (Signed)
Faxed to requesting party via Epic fax function 

## 2019-04-28 ENCOUNTER — Other Ambulatory Visit: Payer: Self-pay

## 2019-04-28 ENCOUNTER — Other Ambulatory Visit: Payer: Medicare Other | Admitting: *Deleted

## 2019-04-28 DIAGNOSIS — I5022 Chronic systolic (congestive) heart failure: Secondary | ICD-10-CM | POA: Diagnosis not present

## 2019-04-28 LAB — BASIC METABOLIC PANEL
BUN/Creatinine Ratio: 22 (ref 10–24)
BUN: 23 mg/dL (ref 10–36)
CO2: 25 mmol/L (ref 20–29)
Calcium: 9.2 mg/dL (ref 8.6–10.2)
Chloride: 100 mmol/L (ref 96–106)
Creatinine, Ser: 1.06 mg/dL (ref 0.76–1.27)
GFR calc Af Amer: 70 mL/min/{1.73_m2} (ref >59)
GFR calc non Af Amer: 61 mL/min/{1.73_m2} (ref >59)
Glucose: 100 mg/dL — ABNORMAL HIGH (ref 65–99)
Potassium: 4.1 mmol/L (ref 3.5–5.2)
Sodium: 138 mmol/L (ref 134–144)

## 2019-05-01 NOTE — Progress Notes (Signed)
The patient has been notified of the result and verbalized understanding.  All questions (if any) were answered. Jacqulynn Cadet, CMA 05/01/2019 1:54 PM

## 2019-05-02 DIAGNOSIS — N401 Enlarged prostate with lower urinary tract symptoms: Secondary | ICD-10-CM | POA: Diagnosis not present

## 2019-05-02 DIAGNOSIS — R350 Frequency of micturition: Secondary | ICD-10-CM | POA: Diagnosis not present

## 2019-05-02 DIAGNOSIS — R351 Nocturia: Secondary | ICD-10-CM | POA: Diagnosis not present

## 2019-05-04 DIAGNOSIS — M5136 Other intervertebral disc degeneration, lumbar region: Secondary | ICD-10-CM | POA: Diagnosis not present

## 2019-05-18 ENCOUNTER — Telehealth: Payer: Self-pay

## 2019-05-18 NOTE — Telephone Encounter (Signed)
I tried to help the pt send a manual transmission but was unsuccessful. I gave the pt the number to [place] support to get additional help.

## 2019-05-18 NOTE — Telephone Encounter (Signed)
Pt wife states [employer] is sending him a new monitor and he will receive it in 7-10 business days. I told her to call me when she receives the new monitor so we can do the transmission and get him back on schedule. The pt wife verbalized understanding.

## 2019-05-19 ENCOUNTER — Telehealth: Payer: Self-pay | Admitting: Interventional Cardiology

## 2019-05-19 NOTE — Telephone Encounter (Signed)
Spoke with Dr. [name] and he said pt needs to reach out to PCP in regards to sx, doesn't sound cardiac.  Left message for wife advising to reach out to PCP and have them contact us or pt can contact after visit if PCP feels Cardiology is needed.

## 2019-05-19 NOTE — Telephone Encounter (Signed)
Left message to call back  

## 2019-05-19 NOTE — Telephone Encounter (Signed)
Pt c/o swelling: STAT is pt has developed SOB within 24 hours  1) How much weight have you gained and in what time span? no  2) If swelling, where is the swelling located? Legs are swelling and his feet are hurting.   3) Are you currently taking a fluid pill? yes  4) Are you currently SOB? no  5) Do you have a log of your daily weights (if so, list)? no  6) Have you gained 3 pounds in a day or 5 pounds in a week? no  7) Have you traveled recently? no

## 2019-05-19 NOTE — Telephone Encounter (Signed)
Pt with swelling to right foot and leg.  Has been much worse in the past.  Main concern today is he is having pain that shoots from his knee to his foot, mostly in right leg but occurs some in left leg as well.  Denies any falls or bumps to this leg to think possible injury.  Takes Furosemide 60mg  QD.  Weight usually 150-152lbs.  This morning was 147.4lbs.  Denies SOB or CP.  No vitals available but states they are always normal.  Advised I will speak to Dr. [name] and get recommendations but in the meantime, contact PCP as this is likely not cardiac related.  Wife verbalized understanding and was appreciative for call.

## 2019-05-22 DIAGNOSIS — Z23 Encounter for immunization: Secondary | ICD-10-CM | POA: Diagnosis not present

## 2019-05-22 DIAGNOSIS — I8310 Varicose veins of unspecified lower extremity with inflammation: Secondary | ICD-10-CM | POA: Diagnosis not present

## 2019-05-22 DIAGNOSIS — R0981 Nasal congestion: Secondary | ICD-10-CM | POA: Diagnosis not present

## 2019-05-22 DIAGNOSIS — R609 Edema, unspecified: Secondary | ICD-10-CM | POA: Diagnosis not present

## 2019-05-25 ENCOUNTER — Telehealth: Payer: Self-pay | Admitting: Internal Medicine

## 2019-05-25 DIAGNOSIS — M5136 Other intervertebral disc degeneration, lumbar region: Secondary | ICD-10-CM | POA: Diagnosis not present

## 2019-05-25 DIAGNOSIS — M48061 Spinal stenosis, lumbar region without neurogenic claudication: Secondary | ICD-10-CM | POA: Diagnosis not present

## 2019-05-25 NOTE — Telephone Encounter (Signed)
Follow Up:     Pt said he was supposed to let Loren know when he recived his Monitor in the amil. Pt said he recived it on yesterday(05-24-19).

## 2019-05-26 ENCOUNTER — Telehealth: Payer: Self-pay

## 2019-05-26 NOTE — Telephone Encounter (Signed)
LMOVM for pt to call my direct office number. °

## 2019-05-29 ENCOUNTER — Ambulatory Visit (INDEPENDENT_AMBULATORY_CARE_PROVIDER_SITE_OTHER): Payer: Medicare Other | Admitting: *Deleted

## 2019-05-29 DIAGNOSIS — I5022 Chronic systolic (congestive) heart failure: Secondary | ICD-10-CM | POA: Diagnosis not present

## 2019-05-29 LAB — CUP PACEART REMOTE DEVICE CHECK
Battery Impedance: 764 Ohm
Battery Remaining Longevity: 77 mo
Battery Voltage: 2.78 V
Brady Statistic RV Percent Paced: 15 %
Date Time Interrogation Session: 20201012165407
Implantable Lead Implant Date: 20080818
Implantable Lead Implant Date: 20080818
Implantable Lead Location: 753859
Implantable Lead Location: 753860
Implantable Lead Model: 4469
Implantable Lead Model: 4470
Implantable Lead Serial Number: 499409
Implantable Lead Serial Number: 602985
Implantable Pulse Generator Implant Date: 20150320
Lead Channel Impedance Value: 496 Ohm
Lead Channel Impedance Value: 67 Ohm
Lead Channel Pacing Threshold Amplitude: 1.25 V
Lead Channel Pacing Threshold Pulse Width: 0.4 ms
Lead Channel Setting Pacing Amplitude: 2.5 V
Lead Channel Setting Pacing Pulse Width: 0.4 ms
Lead Channel Setting Sensing Sensitivity: 5.6 mV

## 2019-05-29 NOTE — Progress Notes (Signed)
Cardiology Office Note:    Date:  05/30/2019   ID:  Adam [name], DOB [birth year], [name] [id number]  PCP:  [name], MD  Cardiologist:  Sinclair Grooms, MD   Referring MD: [name], MD   Chief Complaint  Patient presents with  . Atrial Fibrillation  . Congestive Heart Failure  . Coronary Artery Disease    History of Present Illness:    Adam [name] is a 85 y.o. male with a hx of ischemic cardiomyopathy,permanent pacemaker,chronic systolic heart failure, chronic atrial fibrillation, CAD with bypass graft failure (saphenous vein graft to the obtuse marginal), hypertension, chronic anticoagulation therapy, hypertension, and hyperlipidemia. EF is 35% per echo from 03/2016..   [name] is doing well.  He denies angina.  Dyspnea is not a significant problem and he has no orthopnea.  He does have lower extremity swelling.  He does have bilateral lower extremity and upper extremity ecchymoses.  He has not needed to use nitroglycerin.  He has not had syncope.  Occasionally lower extremity swelling is bad enough to prevent him wearing certain shoes.  Pacemaker function has been noted to be normal.  His current furosemide dose is different than what we had on record.  He seems to be doing okay taking 60 mg Monday Wednesday Friday and Sunday and 80 mg Tuesday Thursday Saturday.  We will not make any changes.  Past Medical History:  Diagnosis Date  . AAA (abdominal aortic aneurysm) ([place])   . Anginal pain ([place])   . Arthritis   . CAD (coronary artery disease)    Adam [name] IS CARDIO  . CHF (congestive heart failure) ([place])   . Chronic anticoagulation   . Diverticulosis   . GERD (gastroesophageal reflux disease)   . Hyperlipidemia   . Hypertension   . LBP (low back pain)   . Permanent atrial fibrillation ([place])    decision made at [facility]  . Presence of permanent cardiac pacemaker   . S/P CABG (coronary artery bypass graft)   . Shortness of breath dyspnea   .  Sleep apnea     Past Surgical History:  Procedure Laterality Date  . CARDIOVERSION  06/30/2012   Procedure: CARDIOVERSION;  Surgeon: Sinclair Grooms, MD;  Location: [facility] ENDOSCOPY;  Service: Cardiovascular;  Laterality: N/A;  . CARDIOVERSION N/A 11/23/2013   Procedure: CARDIOVERSION;  Surgeon: Sinclair Grooms, MD;  Location: [place];  Service: Cardiovascular;  Laterality: N/A;  . CATARACT EXTRACTION W/ INTRAOCULAR LENS  IMPLANT, BILATERAL    . CORONARY ARTERY BYPASS GRAFT  Feb 1989   "CABG X6"  . ENDOVASCULAR STENT INSERTION  07/28/2011   Procedure: ENDOVASCULAR STENT GRAFT INSERTION;  Surgeon: Hinda Lenis, MD;  Location: [place];  Service: Vascular;  Laterality: N/A;  Pt. on Coumadin/ instructed to hold 5 days prior to procedure  . EYE SURGERY    . INGUINAL HERNIA REPAIR Right 01/04/2015  . INGUINAL HERNIA REPAIR Right 01/04/2015   Procedure:  OPEN REPAIR RIGHT INGUNIAL HERNIA;  Surgeon: Fanny Skates, MD;  Location: [place];  Service: General;  Laterality: Right;  . INSERT / REPLACE / REMOVE PACEMAKER  July 2008  . INSERTION OF MESH Right 01/04/2015   Procedure: INSERTION OF MESH;  Surgeon: Fanny Skates, MD;  Location: [place];  Service: General;  Laterality: Right;  . JOINT REPLACEMENT  1995 & 1996   Bilateral total knee replacements  . LEFT HEART CATHETERIZATION WITH CORONARY ANGIOGRAM N/A 03/28/2014  Procedure: LEFT HEART CATHETERIZATION WITH CORONARY ANGIOGRAM;  Surgeon: Sinclair Grooms, MD;  Location: [facility] CATH LAB;  Service: Cardiovascular;  Laterality: N/A;  . PERMANENT PACEMAKER GENERATOR CHANGE N/A 11/03/2013   Procedure: PERMANENT PACEMAKER GENERATOR CHANGE;  Surgeon: [name], MD;  Location: [facility] CATH LAB;  Service: Cardiovascular;  Laterality: N/A;  . TONSILLECTOMY      Current Medications: Current Meds  Medication Sig  . acetaminophen (TYLENOL 8 HOUR ARTHRITIS PAIN) 650 MG CR tablet Take 650 mg by mouth every 8 (eight) hours as needed for pain.  [name] apixaban  (ELIQUIS) 5 MG TABS tablet Take 5 mg by mouth 2 (two) times daily.  [name] atorvastatin (LIPITOR) 10 MG tablet Take 1 tablet (10 mg total) by mouth every evening.  . Cholecalciferol (VITAMIN D) 2000 UNITS CAPS Take 2,000 Units by mouth daily.  . furosemide (LASIX) 40 MG tablet Take 40 mg by mouth. 60 mg daily; 80 mg on T,Th,Sat.  . latanoprost (XALATAN) 0.005 % ophthalmic solution Place 1 drop into both eyes at bedtime.  . Multiple Vitamins-Minerals (PRESERVISION AREDS 2+MULTI VIT PO) Take 1 capsule by mouth 2 (two) times daily.   . nitroGLYCERIN (NITROSTAT) 0.4 MG SL tablet Place 1 tablet (0.4 mg total) under the tongue every 5 (five) minutes as needed. For chest pain  . Omega-3 Fatty Acids (FISH OIL) 1000 MG CPDR Take 1 capsule by mouth daily.   . silodosin (RAPAFLO) 8 MG CAPS capsule Take 8 mg by mouth at bedtime.      Allergies:   Amlodipine, Lisinopril, Morphine, and Amiodarone   Social History   Socioeconomic History  . Marital status: Married    Spouse name: Not on file  . Number of children: Not on file  . Years of education: Not on file  . Highest education level: Not on file  Occupational History  . Not on file  Social Needs  . Financial resource strain: Not on file  . Food insecurity    Worry: Not on file    Inability: Not on file  . Transportation needs    Medical: Not on file    Non-medical: Not on file  Tobacco Use  . Smoking status: Former Smoker    Years: 3.00    Types: Cigarettes    Quit date: 08/17/1948    Years since quitting: 70.8  . Smokeless tobacco: Never Used  Substance and Sexual Activity  . Alcohol use: No  . Drug use: No  . Sexual activity: Not on file  Lifestyle  . Physical activity    Days per week: Not on file    Minutes per session: Not on file  . Stress: Not on file  Relationships  . Social Herbalist on phone: Not on file    Gets together: Not on file    Attends religious service: Not on file    Active member of club or  organization: Not on file    Attends meetings of clubs or organizations: Not on file    Relationship status: Not on file  Other Topics Concern  . Not on file  Social History Narrative  . Not on file     Family History: The patient's family history includes Cancer in his sister; Heart attack in his father; Heart disease in his father and sister; Heart failure in his mother; Other in his sister; Stroke in his mother; Varicose Veins in his mother.  ROS:   Please see the history of present illness.  Ecchymoses on his lower extremities bother him from the standpoint of the appearance.  He has small spider angioma on his legs and varicosities that he wonders if can be fixed.  All other systems reviewed and are negative.  EKGs/Labs/Other Studies Reviewed:    The following studies were reviewed today: No new or recent cardiac imaging  EKG:  EKG ATRIAL fibrillation, rate of 64 bpm, isolated PVC, and nonspecific ST-T wave abnormality.  When compared to prior the PVC is new.  Recent Labs: 07/05/2018: Hemoglobin 12.1; Platelets 124 04/28/2019: BUN 23; Creatinine, Ser 1.06; Potassium 4.1; Sodium 138  Recent Lipid Panel    Component Value Date/Time   CHOL 138 01/03/2018 1146   TRIG 86 01/03/2018 1146   HDL 53 01/03/2018 1146   CHOLHDL 2.6 01/03/2018 1146   LDLCALC 68 01/03/2018 1146    Physical Exam:    VS:  BP (!) 153/68   Pulse 64   Ht 5\' 8"  (1.727 m)   Wt 149 lb 1.9 oz (67.6 kg)   SpO2 100%   BMI 22.67 kg/m     Wt Readings from Last 3 Encounters:  05/30/19 149 lb 1.9 oz (67.6 kg)  11/28/18 155 lb (70.3 kg)  07/05/18 159 lb 12.8 oz (72.5 kg)     GEN: Elderly and frail although appears younger than stated age of 44.. No acute distress HEENT: Normal NECK: No JVD. LYMPHATICS: No lymphadenopathy CARDIAC:  RRR without murmur, gallop, or edema. VASCULAR:  Normal Pulses. No bruits.  Ecchymoses noted on both lower extremities. RESPIRATORY:  Clear to auscultation without rales,  wheezing or rhonchi  ABDOMEN: Soft, non-tender, non-distended, No pulsatile mass, MUSCULOSKELETAL: No deformity  SKIN: Warm and dry NEUROLOGIC:  Alert and oriented x 3 PSYCHIATRIC:  Normal affect   ASSESSMENT:    1. Chronic systolic heart failure ([place])   2. Atherosclerosis of coronary artery bypass graft of native heart with other forms of angina pectoris ([place])   3. Long term current use of anticoagulant therapy   4. Permanent atrial fibrillation ([place])   5. Bradycardia   6. Other hyperlipidemia   7. Educated about COVID-19 virus infection    PLAN:    In order of problems listed above:  1. Continue current therapy.  No evidence of volume overload based on clinical appearance 2. Secondary prevention discussed and lipid data reviewed.  He is following short the exercise metric due to the pandemic. 3. Continue current therapy for stroke prevention that includes Eliquis 5 mg twice per day. 4. Atrial fibrillation is documented and is permanent. 5. Normally functioning pacemaker. 6. Most recent LDL documented 75. 7. W3 discussed and endorses a lifestyle measure.  Overall education and awareness concerning primary/secondary risk prevention was discussed in detail: LDL less than 70, hemoglobin A1c less than 7, blood pressure target less than 130/80 mmHg, >150 minutes of moderate aerobic activity per week, avoidance of smoking, weight control (via diet and exercise), and continued surveillance/management of/for obstructive sleep apnea.     Medication Adjustments/Labs and Tests Ordered: Current medicines are reviewed at length with the patient today.  Concerns regarding medicines are outlined above.  Orders Placed This Encounter  Procedures  . Basic metabolic panel  . Pro b natriuretic peptide  . CBC  . EKG 12-Lead   No orders of the defined types were placed in this encounter.   Patient Instructions  Medication Instructions:  Your physician recommends that you continue on your  current medications as directed. Please refer to the Current Medication  list given to you today.  If you need a refill on your cardiac medications before your next appointment, please call your pharmacy.   Lab work: BMET, Pro BNP and CBC in January  If you have labs (blood work) drawn today and your tests are completely normal, you will receive your results only by: [name] MyChart Message (if you have MyChart) OR . A paper copy in the mail If you have any lab test that is abnormal or we need to change your treatment, we will call you to review the results.  Testing/Procedures: None  Follow-Up: At [facility], you and your health needs are our priority.  As part of our continuing mission to provide you with exceptional heart care, we have created designated Provider Care Teams.  These Care Teams include your primary Cardiologist (physician) and Advanced Practice Providers (APPs -  Physician Assistants and Nurse Practitioners) who all work together to provide you with the care you need, when you need it. You will need a follow up appointment in 9-12 months.  Please call our office 2 months in advance to schedule this appointment.  You may see Sinclair Grooms, MD or one of the following Advanced Practice Providers on your designated Care Team:   Truitt Merle, NP Cecilie Kicks, NP . Kathyrn Drown, NP  Any Other Special Instructions Will Be Listed Below (If Applicable).       Signed, Sinclair Grooms, MD  05/30/2019 1:54 PM    [facility]

## 2019-05-29 NOTE — Telephone Encounter (Signed)
I help the pt send a manual transmission with his home monitor. I scheduled him for an appointment in epic.

## 2019-05-30 ENCOUNTER — Ambulatory Visit: Payer: Medicare Other | Admitting: Interventional Cardiology

## 2019-05-30 ENCOUNTER — Encounter (INDEPENDENT_AMBULATORY_CARE_PROVIDER_SITE_OTHER): Payer: Self-pay

## 2019-05-30 ENCOUNTER — Other Ambulatory Visit: Payer: Self-pay

## 2019-05-30 ENCOUNTER — Encounter: Payer: Self-pay | Admitting: Interventional Cardiology

## 2019-05-30 ENCOUNTER — Ambulatory Visit (INDEPENDENT_AMBULATORY_CARE_PROVIDER_SITE_OTHER): Payer: Medicare Other | Admitting: Interventional Cardiology

## 2019-05-30 VITALS — BP 153/68 | HR 64 | Ht 68.0 in | Wt 149.1 lb

## 2019-05-30 DIAGNOSIS — I4821 Permanent atrial fibrillation: Secondary | ICD-10-CM

## 2019-05-30 DIAGNOSIS — Z7901 Long term (current) use of anticoagulants: Secondary | ICD-10-CM

## 2019-05-30 DIAGNOSIS — I5022 Chronic systolic (congestive) heart failure: Secondary | ICD-10-CM

## 2019-05-30 DIAGNOSIS — I255 Ischemic cardiomyopathy: Secondary | ICD-10-CM | POA: Diagnosis not present

## 2019-05-30 DIAGNOSIS — I25708 Atherosclerosis of coronary artery bypass graft(s), unspecified, with other forms of angina pectoris: Secondary | ICD-10-CM

## 2019-05-30 DIAGNOSIS — Z7189 Other specified counseling: Secondary | ICD-10-CM

## 2019-05-30 DIAGNOSIS — E7849 Other hyperlipidemia: Secondary | ICD-10-CM

## 2019-05-30 DIAGNOSIS — R001 Bradycardia, unspecified: Secondary | ICD-10-CM

## 2019-05-30 NOTE — Patient Instructions (Signed)
Medication Instructions:  Your physician recommends that you continue on your current medications as directed. Please refer to the Current Medication list given to you today.  If you need a refill on your cardiac medications before your next appointment, please call your pharmacy.   Lab work: BMET, Pro BNP and CBC in January  If you have labs (blood work) drawn today and your tests are completely normal, you will receive your results only by: [name] MyChart Message (if you have MyChart) OR . A paper copy in the mail If you have any lab test that is abnormal or we need to change your treatment, we will call you to review the results.  Testing/Procedures: None  Follow-Up: At [facility], you and your health needs are our priority.  As part of our continuing mission to provide you with exceptional heart care, we have created designated Provider Care Teams.  These Care Teams include your primary Cardiologist (physician) and Advanced Practice Providers (APPs -  Physician Assistants and Nurse Practitioners) who all work together to provide you with the care you need, when you need it. You will need a follow up appointment in 9-12 months.  Please call our office 2 months in advance to schedule this appointment.  You may see Sinclair Grooms, MD or one of the following Advanced Practice Providers on your designated Care Team:   Truitt Merle, NP Cecilie Kicks, NP . Kathyrn Drown, NP  Any Other Special Instructions Will Be Listed Below (If Applicable).

## 2019-05-31 ENCOUNTER — Encounter: Payer: Self-pay | Admitting: Internal Medicine

## 2019-05-31 ENCOUNTER — Ambulatory Visit (INDEPENDENT_AMBULATORY_CARE_PROVIDER_SITE_OTHER): Payer: Medicare Other | Admitting: Internal Medicine

## 2019-05-31 VITALS — BP 148/72 | HR 64 | Ht 68.0 in | Wt 149.0 lb

## 2019-05-31 DIAGNOSIS — I4821 Permanent atrial fibrillation: Secondary | ICD-10-CM | POA: Diagnosis not present

## 2019-05-31 DIAGNOSIS — I255 Ischemic cardiomyopathy: Secondary | ICD-10-CM

## 2019-05-31 DIAGNOSIS — R001 Bradycardia, unspecified: Secondary | ICD-10-CM | POA: Diagnosis not present

## 2019-05-31 DIAGNOSIS — Z950 Presence of cardiac pacemaker: Secondary | ICD-10-CM | POA: Diagnosis not present

## 2019-05-31 LAB — CUP PACEART INCLINIC DEVICE CHECK
Battery Impedance: 817 Ohm
Battery Remaining Longevity: 79 mo
Battery Voltage: 2.77 V
Brady Statistic RV Percent Paced: 15 %
Date Time Interrogation Session: 20201014173613
Implantable Lead Implant Date: 20080818
Implantable Lead Implant Date: 20080818
Implantable Lead Location: 753859
Implantable Lead Location: 753860
Implantable Lead Model: 4469
Implantable Lead Model: 4470
Implantable Lead Serial Number: 499409
Implantable Lead Serial Number: 602985
Implantable Pulse Generator Implant Date: 20150320
Lead Channel Impedance Value: 513 Ohm
Lead Channel Impedance Value: 67 Ohm
Lead Channel Pacing Threshold Amplitude: 1.25 V
Lead Channel Pacing Threshold Pulse Width: 0.4 ms
Lead Channel Sensing Intrinsic Amplitude: 15.67 mV
Lead Channel Setting Pacing Amplitude: 2.5 V
Lead Channel Setting Pacing Pulse Width: 0.4 ms
Lead Channel Setting Sensing Sensitivity: 5.6 mV

## 2019-05-31 NOTE — Progress Notes (Signed)
Patient Care Team: [name], MD as PCP - General (Family Medicine) [name], MD as PCP - Cardiology (Cardiology) [name], MD as Consulting Physician (Cardiology)   HPI  Adam [name] is a 85 y.o. 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  His edema is much improved.  He has atrial fibrillation now considered permanent.  He has coronary disease and is status post CABG  No chest pain.  Minimal shortness of breath.  Chronic but much improved edema as noted above  No bleeding  Biggest issue is urinary troubles at night     DATE TEST EF   2015 Echo  25%   8/15 LHC EF 25%  LIMA/SVG-D1,RCA -patent SVG-OM-T  8/17 Echo   35 %         Date Cr K Hgb  6/18 0.94 3.9 12.7  8/19  0.97 4.8 13.0  9/20 1.06 4.1 12.1      Past Medical History:  Diagnosis Date  . AAA (abdominal aortic aneurysm) ([place])   . Anginal pain ([place])   . Arthritis   . CAD (coronary artery disease)    [name]  . CHF (congestive heart failure) ([place])   . Chronic anticoagulation   . Diverticulosis   . GERD (gastroesophageal reflux disease)   . Hyperlipidemia   . Hypertension   . LBP (low back pain)   . Permanent atrial fibrillation ([place])    decision made at [facility]  . Presence of permanent cardiac pacemaker   . S/P CABG (coronary artery bypass graft)   . Shortness of breath dyspnea   . Sleep apnea     Past Surgical History:  Procedure Laterality Date  . CARDIOVERSION  06/30/2012   Procedure: CARDIOVERSION;  Surgeon: Sinclair Grooms, MD;  Location: [facility] ENDOSCOPY;  Service: Cardiovascular;  Laterality: N/A;  . CARDIOVERSION N/A 11/23/2013   Procedure: CARDIOVERSION;  Surgeon: Sinclair Grooms, MD;  Location: [place];  Service: Cardiovascular;  Laterality: N/A;  . CATARACT EXTRACTION W/  INTRAOCULAR LENS  IMPLANT, BILATERAL    . CORONARY ARTERY BYPASS GRAFT  Feb 1989   "CABG X6"  . ENDOVASCULAR STENT INSERTION  07/28/2011   Procedure: ENDOVASCULAR STENT GRAFT INSERTION;  Surgeon: Hinda Lenis, MD;  Location: [place];  Service: Vascular;  Laterality: N/A;  Pt. on Coumadin/ instructed to hold 5 days prior to procedure  . EYE SURGERY    . INGUINAL HERNIA REPAIR Right 01/04/2015  . INGUINAL HERNIA REPAIR Right 01/04/2015   Procedure:  OPEN REPAIR RIGHT INGUNIAL HERNIA;  Surgeon: Fanny Skates, MD;  Location: [place];  Service: General;  Laterality: Right;  . INSERT / REPLACE / REMOVE PACEMAKER  July 2008  . INSERTION OF MESH Right 01/04/2015   Procedure: INSERTION OF MESH;  Surgeon: Fanny Skates, MD;  Location: [place];  Service: General;  Laterality: Right;  . JOINT REPLACEMENT  1995 & 1996   Bilateral total knee replacements  . LEFT HEART CATHETERIZATION WITH CORONARY ANGIOGRAM N/A 03/28/2014   Procedure: LEFT HEART CATHETERIZATION WITH CORONARY ANGIOGRAM;  Surgeon: Sinclair Grooms, MD;  Location: [facility] CATH LAB;  Service: Cardiovascular;  Laterality: N/A;  . PERMANENT PACEMAKER GENERATOR CHANGE N/A 11/03/2013   Procedure: PERMANENT PACEMAKER GENERATOR CHANGE;  Surgeon: [name],  MD;  Location: [place] CATH LAB;  Service: Cardiovascular;  Laterality: N/A;  . TONSILLECTOMY      Current Outpatient Medications  Medication Sig Dispense Refill  . acetaminophen (TYLENOL 8 HOUR ARTHRITIS PAIN) 650 MG CR tablet Take 650 mg by mouth every 8 (eight) hours as needed for pain.    [name] apixaban (ELIQUIS) 5 MG TABS tablet Take 5 mg by mouth 2 (two) times daily.    [name] atorvastatin (LIPITOR) 10 MG tablet Take 1 tablet (10 mg total) by mouth every evening. 90 tablet 2  . Cholecalciferol (VITAMIN D) 2000 UNITS CAPS Take 2,000 Units by mouth daily.    . furosemide (LASIX) 40 MG tablet Take 40 mg by mouth. 60 mg daily; 80 mg on T,Th,Sat.    . latanoprost (XALATAN) 0.005 % ophthalmic solution Place  1 drop into both eyes at bedtime.    . Multiple Vitamins-Minerals (PRESERVISION AREDS 2+MULTI VIT PO) Take 1 capsule by mouth 2 (two) times daily.     . nitroGLYCERIN (NITROSTAT) 0.4 MG SL tablet Place 1 tablet (0.4 mg total) under the tongue every 5 (five) minutes as needed. For chest pain 25 tablet 5  . Omega-3 Fatty Acids (FISH OIL) 1000 MG CPDR Take 1 capsule by mouth daily.     . silodosin (RAPAFLO) 8 MG CAPS capsule Take 8 mg by mouth at bedtime.      No current facility-administered medications for this visit.     Allergies  Allergen Reactions  . Amlodipine Other (See Comments)  . Lisinopril     Elevated potassium  . Morphine Nausea Only  . Amiodarone Anxiety and Rash    Review of Systems negative except from HPI and PMH  Physical Exam BP (!) 148/72   Pulse 64   Ht 5\' 8"  (1.727 m)   Wt 149 lb (67.6 kg)   BMI 22.66 kg/m  Well developed and nourished in no acute distress HENT normal Neck supple   Carotids brisk and full without bruits Clear Irregularly irregular rate and rhythm with controlled ventricular response, no murmurs or gallops Abd-soft with active BS without hepatomegaly No Clubbing cyanosis edema Skin-warm and dry A & Oriented  Grossly normal sensory and motor function        Assessment and  Plan   Atrial fibrillation-permanent  Ischemic cardiomyopathy  HFrEF   Pacemaker-[employer]tronic  The patient's device was interrogated.  The information was reviewed. No changes were made in the programming.      On Anticoagulation;  No bleeding issues  Without symptoms of ischemia  Relatively euvolemic     We spent more than 50% of our >25 min visit in face to face counseling regarding the above   Superficial bleeding

## 2019-05-31 NOTE — Patient Instructions (Addendum)
Medication Instructions:  Your physician recommends that you continue on your current medications as directed. Please refer to the Current Medication list given to you today.  Labwork: None ordered.  Testing/Procedures: None ordered.  Follow-Up: Your physician wants you to follow-up in: 6 months with Dr. Caryl Comes.   You will receive a reminder letter in the mail two months in advance. If you don't receive a letter, please call our office to schedule the follow-up appointment.  Remote monitoring is used to monitor your Pacemaker from home. This monitoring reduces the number of office visits required to check your device to one time per year. It allows [place] to keep an eye on the functioning of your device to ensure it is working properly. You are scheduled for a device check from home on 08/28/2019. You may send your transmission at any time that day. If you have a wireless device, the transmission will be sent automatically. After your physician reviews your transmission, you will receive a postcard with your next transmission date.  Any Other Special Instructions Will Be Listed Below (If Applicable).  If you need a refill on your cardiac medications before your next appointment, please call your pharmacy.

## 2019-06-07 DIAGNOSIS — J342 Deviated nasal septum: Secondary | ICD-10-CM | POA: Diagnosis not present

## 2019-06-07 DIAGNOSIS — J343 Hypertrophy of nasal turbinates: Secondary | ICD-10-CM | POA: Diagnosis not present

## 2019-06-07 DIAGNOSIS — R0982 Postnasal drip: Secondary | ICD-10-CM | POA: Diagnosis not present

## 2019-06-07 DIAGNOSIS — J310 Chronic rhinitis: Secondary | ICD-10-CM | POA: Diagnosis not present

## 2019-06-07 NOTE — Progress Notes (Signed)
Remote pacemaker transmission.   

## 2019-06-08 ENCOUNTER — Other Ambulatory Visit: Payer: Self-pay

## 2019-06-08 DIAGNOSIS — Z8679 Personal history of other diseases of the circulatory system: Secondary | ICD-10-CM

## 2019-06-12 ENCOUNTER — Ambulatory Visit (HOSPITAL_COMMUNITY)
Admission: RE | Admit: 2019-06-12 | Discharge: 2019-06-12 | Disposition: A | Discharge status: Home / Self Care | Payer: Medicare Other | Source: Ambulatory Visit | Attending: Family | Admitting: Family

## 2019-06-12 ENCOUNTER — Encounter: Payer: Self-pay | Admitting: Family

## 2019-06-12 ENCOUNTER — Other Ambulatory Visit: Payer: Self-pay

## 2019-06-12 ENCOUNTER — Ambulatory Visit (INDEPENDENT_AMBULATORY_CARE_PROVIDER_SITE_OTHER): Payer: Medicare Other | Admitting: Family

## 2019-06-12 VITALS — BP 138/71 | HR 70 | Temp 97.5°F | Resp 16 | Ht 68.0 in | Wt 149.0 lb

## 2019-06-12 DIAGNOSIS — Z95828 Presence of other vascular implants and grafts: Secondary | ICD-10-CM

## 2019-06-12 DIAGNOSIS — I714 Abdominal aortic aneurysm, without rupture, unspecified: Secondary | ICD-10-CM

## 2019-06-12 DIAGNOSIS — I255 Ischemic cardiomyopathy: Secondary | ICD-10-CM

## 2019-06-12 DIAGNOSIS — Z8679 Personal history of other diseases of the circulatory system: Secondary | ICD-10-CM | POA: Diagnosis not present

## 2019-06-12 DIAGNOSIS — Z9889 Other specified postprocedural states: Secondary | ICD-10-CM | POA: Diagnosis not present

## 2019-06-12 NOTE — Progress Notes (Signed)
[facility] [place]  CC: Follow up s/p Endovascular Repair of Abdominal Aortic Aneurysm    History of Present Illness  Adam [name] is a 85 y.o. (July 06, 1925) male who is s/p EVAR (Date: 07/28/11).EVAR duplex (Date: 12/10/14) demonstrates: no endoleak and stable sac size. CTA (Date: 08/25/13) demonstrates: small Type 2 endoleak and shrinking sac size.   The patient has had chronic back pain for "all my life", states he receives injections in his lumbar spine to help his pain. He reports right groin pain intermittently for several years. He states this pain is likely due to his exercising and stretching.  He had a right herniorrhaphy in May 2016.   He denies chest pain or dyspnea, denies fever or chills.   Diabetic: No Tobacco use: former smoker, quit in 1955, smoked for 3 years  He takes Eliquis for atrial fib, also has a pacemaker. He takes a statin.  His cardiologist is Dr. Linard Millers. He is seeing a urologist for BPH.   At pt May 2019 visit with Dr. Bridgett Larsson, EVAR duplex incidental finding notes large L adrenal mass, R adrenal adeneoma. Pt indicated to Dr. Bridgett Larsson that he had been referred for evaluation of his adrenal masses.  No operative management reportedly after work-up.   Past Medical History:  Diagnosis Date  . AAA (abdominal aortic aneurysm) ([place])   . Anginal pain ([place])   . Arthritis   . CAD (coronary artery disease)    [name]  . CHF (congestive heart failure) ([place])   . Chronic anticoagulation   . Diverticulosis   . GERD (gastroesophageal reflux disease)   . Hyperlipidemia   . Hypertension   . LBP (low back pain)   . Permanent atrial fibrillation ([place])    decision made at [facility]  . Presence of permanent cardiac pacemaker   . S/P CABG (coronary artery bypass graft)   . Shortness of breath dyspnea   . Sleep apnea    Past Surgical History:  Procedure Laterality Date  . CARDIOVERSION  06/30/2012   Procedure: CARDIOVERSION;   Surgeon: Sinclair Grooms, MD;  Location: [facility] ENDOSCOPY;  Service: Cardiovascular;  Laterality: N/A;  . CARDIOVERSION N/A 11/23/2013   Procedure: CARDIOVERSION;  Surgeon: Sinclair Grooms, MD;  Location: [place];  Service: Cardiovascular;  Laterality: N/A;  . CATARACT EXTRACTION W/ INTRAOCULAR LENS  IMPLANT, BILATERAL    . CORONARY ARTERY BYPASS GRAFT  Feb 1989   "CABG X6"  . ENDOVASCULAR STENT INSERTION  07/28/2011   Procedure: ENDOVASCULAR STENT GRAFT INSERTION;  Surgeon: Hinda Lenis, MD;  Location: [place];  Service: Vascular;  Laterality: N/A;  Pt. on Coumadin/ instructed to hold 5 days prior to procedure  . EYE SURGERY    . INGUINAL HERNIA REPAIR Right 01/04/2015  . INGUINAL HERNIA REPAIR Right 01/04/2015   Procedure:  OPEN REPAIR RIGHT INGUNIAL HERNIA;  Surgeon: Fanny Skates, MD;  Location: [place];  Service: General;  Laterality: Right;  . INSERT / REPLACE / REMOVE PACEMAKER  July 2008  . INSERTION OF MESH Right 01/04/2015   Procedure: INSERTION OF MESH;  Surgeon: Fanny Skates, MD;  Location: [place];  Service: General;  Laterality: Right;  . JOINT REPLACEMENT  1995 & 1996   Bilateral total knee replacements  . LEFT HEART CATHETERIZATION WITH CORONARY ANGIOGRAM N/A 03/28/2014   Procedure: LEFT HEART CATHETERIZATION WITH CORONARY ANGIOGRAM;  Surgeon: Sinclair Grooms, MD;  Location: [facility] CATH LAB;  Service: Cardiovascular;  Laterality: N/A;  . PERMANENT PACEMAKER GENERATOR CHANGE N/A 11/03/2013   Procedure: PERMANENT PACEMAKER GENERATOR CHANGE;  Surgeon: [name], MD;  Location: [facility] CATH LAB;  Service: Cardiovascular;  Laterality: N/A;  . TONSILLECTOMY     Social History Social History   Tobacco Use  . Smoking status: Former Smoker    Years: 3.00    Types: Cigarettes    Quit date: 08/17/1948    Years since quitting: 70.8  . Smokeless tobacco: Never Used  Substance Use Topics  . Alcohol use: No  . Drug use: No   Family History Family History  Problem Relation Age of  Onset  . Stroke Mother   . Heart failure Mother   . Varicose Veins Mother   . Heart attack Father   . Heart disease Father        Before age 39    Heart Attack age 22 and 39  . Cancer Sister        breast  . Heart disease Sister        After age 75  . Other Sister        DJD   Current Outpatient Medications on File Prior to Visit  Medication Sig Dispense Refill  . acetaminophen (TYLENOL 8 HOUR ARTHRITIS PAIN) 650 MG CR tablet Take 650 mg by mouth every 8 (eight) hours as needed for pain.    [name] apixaban (ELIQUIS) 5 MG TABS tablet Take 5 mg by mouth 2 (two) times daily.    [name] atorvastatin (LIPITOR) 10 MG tablet Take 1 tablet (10 mg total) by mouth every evening. 90 tablet 2  . Cholecalciferol (VITAMIN D) 2000 UNITS CAPS Take 2,000 Units by mouth daily.    . furosemide (LASIX) 40 MG tablet Take 40 mg by mouth. 60 mg daily; 80 mg on T,Th,Sat.    [name] ipratropium (ATROVENT) 0.06 % nasal spray     . latanoprost (XALATAN) 0.005 % ophthalmic solution Place 1 drop into both eyes at bedtime.    . Multiple Vitamins-Minerals (PRESERVISION AREDS 2+MULTI VIT PO) Take 1 capsule by mouth 2 (two) times daily.     . nitroGLYCERIN (NITROSTAT) 0.4 MG SL tablet Place 1 tablet (0.4 mg total) under the tongue every 5 (five) minutes as needed. For chest pain 25 tablet 5  . Omega-3 Fatty Acids (FISH OIL) 1000 MG CPDR Take 1 capsule by mouth daily.     . silodosin (RAPAFLO) 8 MG CAPS capsule Take 8 mg by mouth at bedtime.      No current facility-administered medications on file prior to visit.    Allergies  Allergen Reactions  . Amlodipine Other (See Comments)  . Lisinopril     Elevated potassium  . Morphine Nausea Only  . Amiodarone Anxiety and Rash     ROS: See HPI for pertinent positives and negatives.  Physical Examination  Vitals:   06/12/19 0833  BP: 138/71  Pulse: 70  Resp: 16  Temp: (!) 97.5 F (36.4 C)  TempSrc: Temporal  SpO2: 99%  Weight: 149 lb (67.6 kg)  Height: 5\' 8"  (1.727 m)    Body mass index is 22.66 kg/m.  General: A&O x 3, WD, elderly male appears younger than his stated age, accompanied by his wife HEENT: No gross abnormalities  Pulmonary: Sym exp, respirations are non labored, good air movement in all fields CTAB, no rales, rhonchi, or wheezes Cardiac: Irregular rhythm with controlled rate, no murmur appreciated. Pacemaker palpated right upper chest  Vascular: Vessel Right Left  Radial 2+Palpable 2+Palpable  Carotid  without bruit  without bruit  Aorta Not palpable N/A  Femoral 4+Palpable 3+Palpable  Popliteal 2+ palpable 2+ palpable  PT 1+Palpable 2+Palpable  DP Not Palpable Not Palpable   Gastrointestinal: soft, NTND, -G/R, - HSM, - palpable masses, - CVAT B. Musculoskeletal: M/S 5/5 throughout, extremities without ischemic changes Skin: No rashes, no ulcers, no cellulitis.   Neurologic: Pain and light touch intact in extremities, Motor exam as listed above. CN 2-12 grossly intact except has some hearing loss Psychiatric: Normal thought content, mood appropriate for clinical situation.     DATA  EVAR Duplex   Current (Date: 06-12-19) Abdominal Aorta Findings: +--------+-------+----------+----------+--------+--------+--------+ LocationAP (cm)Trans (cm)PSV (cm/s)WaveformThrombusComments +--------+-------+----------+----------+--------+--------+--------+ Proximal2.32   2.29      100                                +--------+-------+----------+----------+--------+--------+--------+  Endovascular Aortic Repair (EVAR): +----------+----------------+-------------------+-------------------+           Diameter AP (cm)Diameter Trans (cm)Velocities (cm/sec) +----------+----------------+-------------------+-------------------+ Aorta     4.90            5.40               61                  +----------+----------------+-------------------+-------------------+ Right Limb1.18            1.03               155                  +----------+----------------+-------------------+-------------------+ Left Limb 1.33            1.19               89                  +----------+----------------+-------------------+-------------------+  +-------------+-----------------------+ Endoleak TypeNo endoleak identified. +-------------+-----------------------+ Summary: Abdominal Aorta: Residual aortic sac measurements have increased since previous study on 12-29-17 (was 4.7 cm), however no obvious endoleak was identified.   Previous (Date: 12-29-17)  AAA sac size: 4.7 cm  no endoleak detected  Non contrast CT lumbar spine (Date: 05-05-18) Vascular: Aorto bi-iliac stent graft repair of an infrarenal abdominal aortic aneurysm with unchanged maximal left-to-right diameter of the aneurysm sac measuring 5.1 cm. Incomplete imaging of the anterior aspect of the aneurysm   Medical Decision Making   HERSEY BROCKUS is a 85 y.o. male who presents s/p EVAR (Date: 07/28/11).  Pt c/o right groin intermittent pain for years, is s/p right herniorrhaphy, and has chronic lumbar spine issues.  Increased sac size to 5.4 cm today compared to 4.7 cm in May 2019 by duplex.   I discussed with the patient the importance of surveillance of the endograft.  CTA abd/pelvis discussed with pt and wife, advised that this be scheduled for 2-3 weeks, see Dr. Carlis Abbott afterward. Serum creatinine was 1.06 on 04-28-19. However, pt voiced concerns about this, bleeding from his IV site with a previous CT while he was on coumadin. He take Eliquis now.   Instead will schedule pt to see Dr. Carlis Abbott ASAP to discuss increase in sac size to 5.4 cm today, was 4.7 cm in May 2019, was 5.1 cm on non contrast CT of lumbar spine. Wife requested that pt be seen tomorrow by Dr. Carlis Abbott.        At pt May 2019 visit with Dr. Bridgett Larsson,  EVAR duplex incidental finding notes large L adrenal mass, R adrenal adeneoma. Pt indicated to Dr. Bridgett Larsson that he had been referred for  evaluation of his adrenal masses.  No operative management reportedly after work-up.    I emphasized the importance of maximal medical management including strict control of blood pressure, blood glucose, and lipid levels, antiplatelet agents, obtaining regular exercise, and cessation of smoking.   Thank you for allowing [place] to participate in this patient's care.  Clemon Chambers, RN, MSN, FNP-C [facility] [place] Office: [facility] Physician: Trula Slade  06/12/2019, 8:48 AM

## 2019-06-13 ENCOUNTER — Encounter: Payer: Self-pay | Admitting: Vascular Surgery

## 2019-06-13 ENCOUNTER — Ambulatory Visit (INDEPENDENT_AMBULATORY_CARE_PROVIDER_SITE_OTHER): Payer: Medicare Other | Admitting: Vascular Surgery

## 2019-06-13 VITALS — BP 128/78 | HR 79 | Temp 97.4°F | Resp 16 | Ht 68.0 in | Wt 147.0 lb

## 2019-06-13 DIAGNOSIS — I714 Abdominal aortic aneurysm, without rupture, unspecified: Secondary | ICD-10-CM

## 2019-06-13 NOTE — Progress Notes (Signed)
Patient name: Adam [name] [name]: [id number] DOB: 1925-04-24 Sex: male  REASON FOR VISIT: Follow-up to discuss enlarging aneurysm sac status post EVAR in 2012  HPI: Adam [name] is a 85 y.o. male with history of coronary disease status post CABG, atrial fibrillation on DOAC, hypertension, hyperlipidemia, congestive heart failure, previous abdominal aortic aneurysm status post EVAR in 2012 with Dr. Bridgett Larsson.  He was seen yesterday by the nurse practitioner for further surveillance.  His aneurysm was noted to increase from 4.7 to 5.4 cm.  There was no endoleak identified.  He reports he has chronic lower back pain that is unchanged.  His biggest issue is that he is having trouble going to the bathroom with an enlarged prostate.  He is scheduled see his urologist tomorrow.  No previous issues with aneurysm growth.  Past Medical History:  Diagnosis Date  . AAA (abdominal aortic aneurysm) ([place])   . Anginal pain ([place])   . Arthritis   . CAD (coronary artery disease)    Adam [name] IS CARDIO  . CHF (congestive heart failure) ([place])   . Chronic anticoagulation   . Diverticulosis   . GERD (gastroesophageal reflux disease)   . Hyperlipidemia   . Hypertension   . LBP (low back pain)   . Permanent atrial fibrillation ([place])    decision made at [facility]  . Presence of permanent cardiac pacemaker   . S/P CABG (coronary artery bypass graft)   . Shortness of breath dyspnea   . Sleep apnea     Past Surgical History:  Procedure Laterality Date  . CARDIOVERSION  06/30/2012   Procedure: CARDIOVERSION;  Surgeon: Sinclair Grooms, MD;  Location: [facility] ENDOSCOPY;  Service: Cardiovascular;  Laterality: N/A;  . CARDIOVERSION N/A 11/23/2013   Procedure: CARDIOVERSION;  Surgeon: Sinclair Grooms, MD;  Location: [place];  Service: Cardiovascular;  Laterality: N/A;  . CATARACT EXTRACTION W/ INTRAOCULAR LENS  IMPLANT, BILATERAL    . CORONARY ARTERY BYPASS GRAFT  Feb 1989   "CABG X6"  . ENDOVASCULAR STENT  INSERTION  07/28/2011   Procedure: ENDOVASCULAR STENT GRAFT INSERTION;  Surgeon: Hinda Lenis, MD;  Location: [place];  Service: Vascular;  Laterality: N/A;  Pt. on Coumadin/ instructed to hold 5 days prior to procedure  . EYE SURGERY    . INGUINAL HERNIA REPAIR Right 01/04/2015  . INGUINAL HERNIA REPAIR Right 01/04/2015   Procedure:  OPEN REPAIR RIGHT INGUNIAL HERNIA;  Surgeon: Fanny Skates, MD;  Location: [place];  Service: General;  Laterality: Right;  . INSERT / REPLACE / REMOVE PACEMAKER  July 2008  . INSERTION OF MESH Right 01/04/2015   Procedure: INSERTION OF MESH;  Surgeon: Fanny Skates, MD;  Location: [place];  Service: General;  Laterality: Right;  . JOINT REPLACEMENT  1995 & 1996   Bilateral total knee replacements  . LEFT HEART CATHETERIZATION WITH CORONARY ANGIOGRAM N/A 03/28/2014   Procedure: LEFT HEART CATHETERIZATION WITH CORONARY ANGIOGRAM;  Surgeon: Sinclair Grooms, MD;  Location[facility] C[facility] LAB;  Service: Cardiovascular;  Laterality: N/A;  . PERMANENT PACEMAKER GENERATOR CHANGE N/A 11/03/2013   Procedure: PERMANENT PACEMAKER GENERATOR CHANGE;  Surgeon: [name], MD;  Location: [facility] CATH LAB;  Service: Cardiovascular;  Laterality: N/A;  . TONSILLECTOMY      Family History  Problem Relation Age of Onset  . Stroke Mother   . Heart failure Mother   . Varicose Veins Mother   . Heart attack Father   . Heart disease  Father        Before age 49    Heart Attack age 15 and 88  . Cancer Sister        breast  . Heart disease Sister        After age 17  . Other Sister        DJD    SOCIAL HISTORY: Social History   Tobacco Use  . Smoking status: Former Smoker    Years: 3.00    Types: Cigarettes    Quit date: 08/17/1948    Years since quitting: 70.8  . Smokeless tobacco: Never Used  Substance Use Topics  . Alcohol use: No    Allergies  Allergen Reactions  . Amlodipine Other (See Comments)  . Lisinopril     Elevated potassium  . Morphine Nausea Only  .  Amiodarone Anxiety and Rash    Current Outpatient Medications  Medication Sig Dispense Refill  . acetaminophen (TYLENOL 8 HOUR ARTHRITIS PAIN) 650 MG CR tablet Take 650 mg by mouth every 8 (eight) hours as needed for pain.    [name] apixaban (ELIQUIS) 5 MG TABS tablet Take 5 mg by mouth 2 (two) times daily.    [name] atorvastatin (LIPITOR) 10 MG tablet Take 1 tablet (10 mg total) by mouth every evening. 90 tablet 2  . Cholecalciferol (VITAMIN D) 2000 UNITS CAPS Take 2,000 Units by mouth daily.    . furosemide (LASIX) 40 MG tablet Take 40 mg by mouth. 60 mg daily; 80 mg on T,Th,Sat.    [name] ipratropium (ATROVENT) 0.06 % nasal spray     . latanoprost (XALATAN) 0.005 % ophthalmic solution Place 1 drop into both eyes at bedtime.    . Multiple Vitamins-Minerals (PRESERVISION AREDS 2+MULTI VIT PO) Take 1 capsule by mouth 2 (two) times daily.     . nitroGLYCERIN (NITROSTAT) 0.4 MG SL tablet Place 1 tablet (0.4 mg total) under the tongue every 5 (five) minutes as needed. For chest pain 25 tablet 5  . Omega-3 Fatty Acids (FISH OIL) 1000 MG CPDR Take 1 capsule by mouth daily.     . silodosin (RAPAFLO) 8 MG CAPS capsule Take 8 mg by mouth at bedtime.      No current facility-administered medications for this visit.     REVIEW OF SYSTEMS:  [X]  denotes positive finding, [ ]  denotes negative finding Cardiac  Comments:  Chest pain or chest pressure:    Shortness of breath upon exertion:    Short of breath when lying flat:    Irregular heart rhythm:        Vascular    Pain in calf, thigh, or hip brought on by ambulation:    Pain in feet at night that wakes you up from your sleep:     Blood clot in your veins:    Leg swelling:         Pulmonary    Oxygen at home:    Productive cough:     Wheezing:         Neurologic    Sudden weakness in arms or legs:     Sudden numbness in arms or legs:     Sudden onset of difficulty speaking or slurred speech:    Temporary loss of vision in one eye:     Problems with  dizziness:         Gastrointestinal    Blood in stool:     Vomited blood:         Genitourinary  Burning when urinating:     Blood in urine:        Psychiatric    Major depression:         Hematologic    Bleeding problems:    Problems with blood clotting too easily:        Skin    Rashes or ulcers:        Constitutional    Fever or chills:      PHYSICAL EXAM: Vitals:   06/13/19 1228  BP: 128/78  Pulse: 79  Resp: 16  Temp: (!) 97.4 F (36.3 C)  TempSrc: Temporal  SpO2: 99%  Weight: 147 lb (66.7 kg)  Height: 5\' 8"  (1.727 m)    GENERAL: The patient is a well-nourished male, in no acute distress. The vital signs are documented above. CARDIAC: There is a regular rate and rhythm.  VASCULAR:  2+ palpable femoral pulses bilaterally 2+ palpable dorsalis pedis pulses bilaterally PULMONARY: There is good air exchange bilaterally without wheezing or rales. ABDOMEN: Soft and non-tender with normal pitched bowel sounds.  MUSCULOSKELETAL: There are no major deformities or cyanosis. NEUROLOGIC: No focal weakness or paresthesias are detected. SKIN: There are no ulcers or rashes noted.   DATA:   EVAR duplex in 2019 showed 4.7 cm and the now 5.4 cm, no endoleak  Assessment/Plan:  85 year old male with enlarging aneurysm sac now 4.7 to 5.4 cm over the last year following repair in 2012 by Dr. Bridgett Larsson.  There was no endoleak identified on aneurysm duplex yesterday.  I discussed typically when the aneurysm sac has grown more than 5 mm we get serious about intervention.  Suspect he has an endoleak although none was identified on his duplex nor previous CTs in the past.  Discussed with him and his wife that I would recommend CTA abdomen pelvis for further evaluation to ensure the graft is still in good position as well to evaluate for type II endoleak and other sources that would cause his aneurysm sac to enlarge.  He is very hesitant about a CT given that he had a bad experience  several years ago while on Coumadin with a bleeding episode after CT scan.  Overall states he really does not feel well.  I discussed that at 85 years old it may be reasonable to decide no intervention.  Ultimately we decided that I will see him again in 3 months with another EVAR duplex to see how the his aneurysm size continues to change and we can reevaluate CT at that time if he decides.   Adam Heck, MD [facility] [place] Office: [phone] Pager: [phone]

## 2019-06-14 DIAGNOSIS — N401 Enlarged prostate with lower urinary tract symptoms: Secondary | ICD-10-CM | POA: Diagnosis not present

## 2019-06-14 DIAGNOSIS — R3912 Poor urinary stream: Secondary | ICD-10-CM | POA: Diagnosis not present

## 2019-07-20 DIAGNOSIS — H401132 Primary open-angle glaucoma, bilateral, moderate stage: Secondary | ICD-10-CM | POA: Diagnosis not present

## 2019-07-20 DIAGNOSIS — Z961 Presence of intraocular lens: Secondary | ICD-10-CM | POA: Diagnosis not present

## 2019-07-20 DIAGNOSIS — H353132 Nonexudative age-related macular degeneration, bilateral, intermediate dry stage: Secondary | ICD-10-CM | POA: Diagnosis not present

## 2019-07-20 DIAGNOSIS — H43813 Vitreous degeneration, bilateral: Secondary | ICD-10-CM | POA: Diagnosis not present

## 2019-08-07 ENCOUNTER — Encounter: Payer: Self-pay | Admitting: Internal Medicine

## 2019-08-22 ENCOUNTER — Encounter: Payer: Self-pay | Admitting: Podiatry

## 2019-08-22 ENCOUNTER — Other Ambulatory Visit: Payer: Self-pay

## 2019-08-22 ENCOUNTER — Ambulatory Visit (INDEPENDENT_AMBULATORY_CARE_PROVIDER_SITE_OTHER): Payer: Medicare Other | Admitting: Podiatry

## 2019-08-22 DIAGNOSIS — B351 Tinea unguium: Secondary | ICD-10-CM | POA: Diagnosis not present

## 2019-08-22 DIAGNOSIS — D689 Coagulation defect, unspecified: Secondary | ICD-10-CM | POA: Diagnosis not present

## 2019-08-22 DIAGNOSIS — Q828 Other specified congenital malformations of skin: Secondary | ICD-10-CM | POA: Diagnosis not present

## 2019-08-22 DIAGNOSIS — M79676 Pain in unspecified toe(s): Secondary | ICD-10-CM

## 2019-08-23 DIAGNOSIS — C44712 Basal cell carcinoma of skin of right lower limb, including hip: Secondary | ICD-10-CM | POA: Diagnosis not present

## 2019-08-23 DIAGNOSIS — D485 Neoplasm of uncertain behavior of skin: Secondary | ICD-10-CM | POA: Diagnosis not present

## 2019-08-23 DIAGNOSIS — Z85828 Personal history of other malignant neoplasm of skin: Secondary | ICD-10-CM | POA: Diagnosis not present

## 2019-08-23 NOTE — Progress Notes (Signed)
He presents today chief complaint of painful calluses and toenails.  Objective: Vital signs are stable he is alert oriented x3 toenails are long thick yellow dystrophic clinically mycotic and painful palpation.  Reactive hyperkeratotic lesion heel right.  No open lesions or wounds.  Assessment: Pain in limb secondary onychomycosis porokeratosis.  Plan: Debridement of toenails one through five bilateral debridement of reactive hyperkeratotic lesion follow-up with him in 6 months

## 2019-08-28 ENCOUNTER — Ambulatory Visit (INDEPENDENT_AMBULATORY_CARE_PROVIDER_SITE_OTHER): Payer: Medicare Other | Admitting: *Deleted

## 2019-08-28 DIAGNOSIS — I5022 Chronic systolic (congestive) heart failure: Secondary | ICD-10-CM | POA: Diagnosis not present

## 2019-08-28 LAB — CUP PACEART REMOTE DEVICE CHECK
Battery Impedance: 897 Ohm
Battery Remaining Longevity: 74 mo
Battery Voltage: 2.77 V
Brady Statistic RV Percent Paced: 16 %
Date Time Interrogation Session: 20210111122213
Implantable Lead Implant Date: 20080818
Implantable Lead Implant Date: 20080818
Implantable Lead Location: 753859
Implantable Lead Location: 753860
Implantable Lead Model: 4469
Implantable Lead Model: 4470
Implantable Lead Serial Number: 499409
Implantable Lead Serial Number: 602985
Implantable Pulse Generator Implant Date: 20150320
Lead Channel Impedance Value: 471 Ohm
Lead Channel Impedance Value: 67 Ohm
Lead Channel Pacing Threshold Amplitude: 1.375 V
Lead Channel Pacing Threshold Pulse Width: 0.4 ms
Lead Channel Setting Pacing Amplitude: 2.75 V
Lead Channel Setting Pacing Pulse Width: 0.4 ms
Lead Channel Setting Sensing Sensitivity: 5.6 mV

## 2019-08-31 ENCOUNTER — Other Ambulatory Visit: Payer: Self-pay

## 2019-08-31 ENCOUNTER — Other Ambulatory Visit: Payer: Medicare Other | Admitting: *Deleted

## 2019-08-31 DIAGNOSIS — I4821 Permanent atrial fibrillation: Secondary | ICD-10-CM

## 2019-08-31 DIAGNOSIS — Z7901 Long term (current) use of anticoagulants: Secondary | ICD-10-CM | POA: Diagnosis not present

## 2019-08-31 DIAGNOSIS — I5022 Chronic systolic (congestive) heart failure: Secondary | ICD-10-CM | POA: Diagnosis not present

## 2019-09-01 LAB — CBC
Hematocrit: 36.8 % — ABNORMAL LOW (ref 37.5–51.0)
Hemoglobin: 12.7 g/dL — ABNORMAL LOW (ref 13.0–17.7)
MCH: 33.3 pg — ABNORMAL HIGH (ref 26.6–33.0)
MCHC: 34.5 g/dL (ref 31.5–35.7)
MCV: 97 fL (ref 79–97)
Platelets: 150 10*3/uL (ref 150–450)
RBC: 3.81 x10E6/uL — ABNORMAL LOW (ref 4.14–5.80)
RDW: 12.4 % (ref 11.6–15.4)
WBC: 10 10*3/uL (ref 3.4–10.8)

## 2019-09-01 LAB — BASIC METABOLIC PANEL
BUN/Creatinine Ratio: 22 (ref 10–24)
BUN: 22 mg/dL (ref 10–36)
CO2: 24 mmol/L (ref 20–29)
Calcium: 9.5 mg/dL (ref 8.6–10.2)
Chloride: 99 mmol/L (ref 96–106)
Creatinine, Ser: 1 mg/dL (ref 0.76–1.27)
GFR calc Af Amer: 75 mL/min/{1.73_m2} (ref >59)
GFR calc non Af Amer: 65 mL/min/{1.73_m2} (ref >59)
Glucose: 108 mg/dL — ABNORMAL HIGH (ref 65–99)
Potassium: 3.9 mmol/L (ref 3.5–5.2)
Sodium: 141 mmol/L (ref 134–144)

## 2019-09-01 LAB — PRO B NATRIURETIC PEPTIDE: NT-Pro BNP: 900 pg/mL — ABNORMAL HIGH (ref 0–486)

## 2019-09-05 DIAGNOSIS — L309 Dermatitis, unspecified: Secondary | ICD-10-CM | POA: Diagnosis not present

## 2019-09-05 DIAGNOSIS — Z85828 Personal history of other malignant neoplasm of skin: Secondary | ICD-10-CM | POA: Diagnosis not present

## 2019-09-08 ENCOUNTER — Ambulatory Visit: Payer: Medicare Other | Attending: Internal Medicine

## 2019-09-08 ENCOUNTER — Other Ambulatory Visit: Payer: Self-pay

## 2019-09-08 DIAGNOSIS — Z23 Encounter for immunization: Secondary | ICD-10-CM | POA: Insufficient documentation

## 2019-09-08 NOTE — Progress Notes (Signed)
   Covid-19 Vaccination Clinic  Name:  Adam [name]    [name]: [id number] DOB: 1925/05/30  09/08/2019  [name] was observed post Covid-19 immunization for 30 minutes based on pre-vaccination screening without incidence. He was provided with Vaccine Information Sheet and instruction to access the V-Safe system.   [name] was instructed to call 911 with any severe reactions post vaccine: [name] Difficulty breathing  . Swelling of your face and throat  . A fast heartbeat  . A bad rash all over your body  . Dizziness and weakness    Immunizations Administered    Name Date Dose VIS Date Route   Pfizer COVID-19 Vaccine 09/08/2019 11:01 AM 0.3 mL 07/28/2019 Intramuscular   Manufacturer: [place]   Lot: [id number]   [place]: [id number]

## 2019-09-11 ENCOUNTER — Telehealth (HOSPITAL_COMMUNITY): Payer: Self-pay

## 2019-09-11 NOTE — Telephone Encounter (Signed)

## 2019-09-12 ENCOUNTER — Other Ambulatory Visit: Payer: Self-pay

## 2019-09-12 ENCOUNTER — Ambulatory Visit (HOSPITAL_COMMUNITY)
Admission: RE | Admit: 2019-09-12 | Discharge: 2019-09-12 | Disposition: A | Discharge status: Home / Self Care | Payer: Medicare Other | Source: Ambulatory Visit | Attending: Vascular Surgery | Admitting: Vascular Surgery

## 2019-09-12 ENCOUNTER — Encounter: Payer: Self-pay | Admitting: Vascular Surgery

## 2019-09-12 ENCOUNTER — Ambulatory Visit (INDEPENDENT_AMBULATORY_CARE_PROVIDER_SITE_OTHER): Payer: Medicare Other | Admitting: Vascular Surgery

## 2019-09-12 VITALS — BP 145/63 | HR 59 | Temp 97.4°F | Resp 18 | Ht 68.0 in | Wt 151.0 lb

## 2019-09-12 DIAGNOSIS — I714 Abdominal aortic aneurysm, without rupture, unspecified: Secondary | ICD-10-CM

## 2019-09-12 NOTE — Progress Notes (Signed)
Patient name: Adam [name] [name]: [id number] DOB: [birth year] Sex: male  REASON FOR VISIT: Ongoing follow-up for surveillance of enlarging aneurysm sac status post EVAR in 2012  HPI: QUENTINE RAGIN is a 86 y.o. male with history of coronary disease status post CABG, atrial fibrillation on DOAC, hypertension, hyperlipidemia, congestive heart failure, previous abdominal aortic aneurysm status post EVAR in 2012 with Dr. Bridgett Larsson.  He was seen late last year by the nurse practitioner for further surveillance.  His aneurysm was noted to increase from 4.7 to 5.4 cm.  There was no endoleak identified.  He was subsequently sent to see me last year.  I initially discussed plans for CTA for further evaluation.  Patient was very hesitant about a CT scan given a very poor experience in the past with IV access while on coumadin.  We subsequently decided to follow-up in 3 months with another duplex for ongoing surveillance.  He reports no new issues today.  He has chronic back pain that is unchanged.  No new abdominal pain.  Past Medical History:  Diagnosis Date  . AAA (abdominal aortic aneurysm) ([place])   . Anginal pain ([place])   . Arthritis   . CAD (coronary artery disease)    [name]  . CHF (congestive heart failure) ([place])   . Chronic anticoagulation   . Diverticulosis   . GERD (gastroesophageal reflux disease)   . Hyperlipidemia   . Hypertension   . LBP (low back pain)   . Permanent atrial fibrillation ([place])    decision made at [facility]  . Presence of permanent cardiac pacemaker   . S/P CABG (coronary artery bypass graft)   . Shortness of breath dyspnea   . Sleep apnea     Past Surgical History:  Procedure Laterality Date  . CARDIOVERSION  06/30/2012   Procedure: CARDIOVERSION;  Surgeon: Sinclair Grooms, MD;  Location: [facility] ENDOSCOPY;  Service: Cardiovascular;  Laterality: N/A;  . CARDIOVERSION N/A 11/23/2013   Procedure: CARDIOVERSION;  Surgeon: Sinclair Grooms, MD;  Location: [place];  Service: Cardiovascular;  Laterality: N/A;  . CATARACT EXTRACTION W/ INTRAOCULAR LENS  IMPLANT, BILATERAL    . CORONARY ARTERY BYPASS GRAFT  Feb 1989   "CABG X6"  . ENDOVASCULAR STENT INSERTION  07/28/2011   Procedure: ENDOVASCULAR STENT GRAFT INSERTION;  Surgeon: Hinda Lenis, MD;  Location: [place];  Service: Vascular;  Laterality: N/A;  Pt. on Coumadin/ instructed to hold 5 days prior to procedure  . EYE SURGERY    . INGUINAL HERNIA REPAIR Right 01/04/2015  . INGUINAL HERNIA REPAIR Right 01/04/2015   Procedure:  OPEN REPAIR RIGHT INGUNIAL HERNIA;  Surgeon: Fanny Skates, MD;  Location: [place];  Service: General;  Laterality: Right;  . INSERT / REPLACE / REMOVE PACEMAKER  July 2008  . INSERTION OF MESH Right 01/04/2015   Procedure: INSERTION OF MESH;  Surgeon: Fanny Skates, MD;  Location: [place];  Service: General;  Laterality: Right;  . JOINT REPLACEMENT  1995 & 1996   Bilateral total knee replacements  . LEFT HEART CATHETERIZATION WITH CORONARY ANGIOGRAM N/A 03/28/2014   Procedure: LEFT HEART CATHETERIZATION WITH CORONARY ANGIOGRAM;  Surgeon: Sinclair Grooms, MD;  Location: [facility] CATH LAB;  Service: Cardiovascular;  Laterality: N/A;  . PERMANENT PACEMAKER GENERATOR CHANGE N/A 11/03/2013   Procedure: PERMANENT PACEMAKER GENERATOR CHANGE;  Surgeon: [name], MD;  Location: [facility] CATH LAB;  Service: Cardiovascular;  Laterality: N/A;  . TONSILLECTOMY  Family History  Problem Relation Age of Onset  . Stroke Mother   . Heart failure Mother   . Varicose Veins Mother   . Heart attack Father   . Heart disease Father        Before age 46    Heart Attack age 59 and 70  . Cancer Sister        breast  . Heart disease Sister        After age 12  . Other Sister        DJD    SOCIAL HISTORY: Social History   Tobacco Use  . Smoking status: Former Smoker    Years: 3.00    Types: Cigarettes    Quit date: 08/17/1948    Years since quitting: 71.1  . Smokeless tobacco:  Never Used  Substance Use Topics  . Alcohol use: No    Allergies  Allergen Reactions  . Amlodipine Other (See Comments)  . Lisinopril     Elevated potassium  . Morphine Nausea Only  . Amiodarone Anxiety and Rash    Current Outpatient Medications  Medication Sig Dispense Refill  . acetaminophen (TYLENOL 8 HOUR ARTHRITIS PAIN) 650 MG CR tablet Take 650 mg by mouth every 8 (eight) hours as needed for pain.    [name] apixaban (ELIQUIS) 5 MG TABS tablet Take 5 mg by mouth 2 (two) times daily.    [name] atorvastatin (LIPITOR) 10 MG tablet Take 1 tablet (10 mg total) by mouth every evening. 90 tablet 2  . Cholecalciferol (VITAMIN D) 2000 UNITS CAPS Take 2,000 Units by mouth daily.    . finasteride (PROSCAR) 5 MG tablet Take 5 mg by mouth daily.    . furosemide (LASIX) 40 MG tablet Take 40 mg by mouth. 60 mg daily; 80 mg on T,Th,Sat.    [name] ipratropium (ATROVENT) 0.06 % nasal spray     . latanoprost (XALATAN) 0.005 % ophthalmic solution Place 1 drop into both eyes at bedtime.    . Multiple Vitamins-Minerals (PRESERVISION AREDS 2+MULTI VIT PO) Take 1 capsule by mouth 2 (two) times daily.     . nitroGLYCERIN (NITROSTAT) 0.4 MG SL tablet Place 1 tablet (0.4 mg total) under the tongue every 5 (five) minutes as needed. For chest pain 25 tablet 5  . Omega-3 Fatty Acids (FISH OIL) 1000 MG CPDR Take 1 capsule by mouth daily.     . silodosin (RAPAFLO) 8 MG CAPS capsule Take 8 mg by mouth at bedtime.      No current facility-administered medications for this visit.    REVIEW OF SYSTEMS:  [X]  denotes positive finding, [ ]  denotes negative finding Cardiac  Comments:  Chest pain or chest pressure:    Shortness of breath upon exertion:    Short of breath when lying flat:    Irregular heart rhythm:        Vascular    Pain in calf, thigh, or hip brought on by ambulation:    Pain in feet at night that wakes you up from your sleep:     Blood clot in your veins:    Leg swelling:         Pulmonary    Oxygen  at home:    Productive cough:     Wheezing:         Neurologic    Sudden weakness in arms or legs:     Sudden numbness in arms or legs:     Sudden onset of difficulty speaking or slurred  speech:    Temporary loss of vision in one eye:     Problems with dizziness:         Gastrointestinal    Blood in stool:     Vomited blood:         Genitourinary    Burning when urinating:     Blood in urine:        Psychiatric    Major depression:         Hematologic    Bleeding problems:    Problems with blood clotting too easily:        Skin    Rashes or ulcers:        Constitutional    Fever or chills:      PHYSICAL EXAM: Vitals:   09/12/19 0926  BP: (!) 145/63  Pulse: (!) 59  Resp: 18  Temp: (!) 97.4 F (36.3 C)  TempSrc: Temporal  SpO2: 98%  Weight: 151 lb (68.5 kg)  Height: 5\' 8"  (1.727 m)    GENERAL: The patient is a well-nourished male, in no acute distress. The vital signs are documented above. CARDIAC: There is a regular rate and rhythm.  VASCULAR:  2+ palpable femoral pulses bilaterally PULMONARY: There is good air exchange bilaterally without wheezing or rales. ABDOMEN: Soft and non-tender with normal pitched bowel sounds.  MUSCULOSKELETAL: There are no major deformities or cyanosis. NEUROLOGIC: No focal weakness or paresthesias are detected. SKIN: There are no ulcers or rashes noted.   DATA:   EVAR duplex in 2019 showed 4.7 cm aneurysm  EVAR duplex today shows essentially unchanged exam with largest aortic measurement being 5.5 cm today and was 5.4 cm 3 months ago.  Assessment/Plan:  86 year old male here for ongoing follow-up of enlarging aneurysm sac.  Fortunately on reevaluation at 3 months the aneurysm is essentially unchanged at 5.5 today and it was 5.4 cm approximately 3 months ago.  Discussed given no interval change over the last 3 months I am comfortable with continued surveillance for now.  We will plan to see him again in 6 months with an EVAR  duplex.  There was no endoleak identified again on exam today.   We had previously discussed CTA last year to further evaluate his expanding aneurysm but he was hesitant given a very bad experience with a CT scan in the past while he was on Coumadin.  Given stable aneurysm sac over the past 3 months, I think it is perfectly reasonable to delay any CT plan at this time.  I further discussed with him that at 48 with his medical issues he may elect no further intervention but we would certainly continue to follow this for the time being.  I will see him back in 6 months.    Marty Heck, MD [facility] [place] Office: [phone] Pager: [phone]

## 2019-09-13 ENCOUNTER — Other Ambulatory Visit: Payer: Self-pay | Admitting: *Deleted

## 2019-09-13 DIAGNOSIS — C44712 Basal cell carcinoma of skin of right lower limb, including hip: Secondary | ICD-10-CM | POA: Diagnosis not present

## 2019-09-13 DIAGNOSIS — Z85828 Personal history of other malignant neoplasm of skin: Secondary | ICD-10-CM | POA: Diagnosis not present

## 2019-09-13 DIAGNOSIS — Z95828 Presence of other vascular implants and grafts: Secondary | ICD-10-CM

## 2019-09-29 ENCOUNTER — Ambulatory Visit: Payer: Medicare Other | Attending: Internal Medicine

## 2019-09-29 DIAGNOSIS — Z23 Encounter for immunization: Secondary | ICD-10-CM | POA: Insufficient documentation

## 2019-09-29 NOTE — Progress Notes (Signed)
   Covid-19 Vaccination Clinic  Name:  Adam [name]    [name]: [id number] DOB: 12/23/1924  09/29/2019  [name] was observed post Covid-19 immunization for 15 minutes without incidence. He was provided with Vaccine Information Sheet and instruction to access the V-Safe system.   [name] was instructed to call 911 with any severe reactions post vaccine: [name] Difficulty breathing  . Swelling of your face and throat  . A fast heartbeat  . A bad rash all over your body  . Dizziness and weakness    Immunizations Administered    Name Date Dose VIS Date Route   Pfizer COVID-19 Vaccine 09/29/2019  2:32 PM 0.3 mL 07/28/2019 Intramuscular   Manufacturer: [place]   Lot: [id number]   [place]: [id number]

## 2019-10-03 ENCOUNTER — Ambulatory Visit: Payer: Medicare Other | Admitting: Podiatry

## 2019-11-09 ENCOUNTER — Telehealth: Payer: Self-pay | Admitting: Interventional Cardiology

## 2019-11-09 NOTE — Telephone Encounter (Signed)
   Pt c/o swelling: STAT is pt has developed SOB within 24 hours  1) How much weight have you gained and in what time span? No weight gain  2) If swelling, where is the swelling located? Right leg  3) Are you currently taking a fluid pill? No, but pt takes eliquis  4) Are you currently SOB? No  5) Do you have a log of your daily weights (if so, list)? No  6) Have you gained 3 pounds in a day or 5 pounds in a week? no  7) Have you traveled recently? No  Pt's wife called, pt's right leg been swelling, pt's wife said normally eliquis helps with the swelling and what concerns them the most he has a knot in the back of his leg that doesn't go away  Please call

## 2019-11-13 DIAGNOSIS — H401132 Primary open-angle glaucoma, bilateral, moderate stage: Secondary | ICD-10-CM | POA: Diagnosis not present

## 2019-11-13 NOTE — Telephone Encounter (Signed)
Spoke with pt's wife (DPR) who states pt is having swelling, pain and numbness in his right calf x 2 weeks.  Some swelling of foot and ankle on right side as well that resolves at night while sleeping.  Pt's wife denies red streaks, abnormal redness or weeping.  Pt's wife advised to contact pt's PCP to schedule appointment ASAP for evaluation.  She states she will call PCP office now.  Pt's wife verbalizes understanding and agrees with current plan

## 2019-11-13 NOTE — Telephone Encounter (Signed)
Follow up   Patient's wife states that she is returning your call. Please call.

## 2019-11-13 NOTE — Telephone Encounter (Signed)
Follow Up  Patient's wife is calling back in to follow up about swelling. States that she did not hear anything back from message left on Thursday. Please give patient/patient's wife a call back to assist.

## 2019-11-15 ENCOUNTER — Other Ambulatory Visit (HOSPITAL_COMMUNITY): Payer: Self-pay | Admitting: Family Medicine

## 2019-11-15 DIAGNOSIS — M79604 Pain in right leg: Secondary | ICD-10-CM

## 2019-11-15 DIAGNOSIS — D6869 Other thrombophilia: Secondary | ICD-10-CM | POA: Diagnosis not present

## 2019-11-15 DIAGNOSIS — M7989 Other specified soft tissue disorders: Secondary | ICD-10-CM | POA: Diagnosis not present

## 2019-11-16 ENCOUNTER — Other Ambulatory Visit: Payer: Self-pay

## 2019-11-16 ENCOUNTER — Ambulatory Visit (HOSPITAL_COMMUNITY)
Admission: RE | Admit: 2019-11-16 | Discharge: 2019-11-16 | Disposition: A | Discharge status: Home / Self Care | Payer: Medicare Other | Source: Ambulatory Visit | Attending: Family Medicine | Admitting: Family Medicine

## 2019-11-16 DIAGNOSIS — M7989 Other specified soft tissue disorders: Secondary | ICD-10-CM | POA: Insufficient documentation

## 2019-11-16 DIAGNOSIS — M79604 Pain in right leg: Secondary | ICD-10-CM | POA: Diagnosis not present

## 2019-11-16 NOTE — Progress Notes (Addendum)
Right lower extremity venous duplex has been completed. Preliminary results can be found in CV Proc through chart review.  While getting on the stretcher, the patient suffered a skin tear on his right lateral calf, distal to the knee. The wound was wrapped with gauze and taped. Results were given to [facility] at Dr. Harrington Challenger' office.   11/16/19 10:49 AM Adam [name] RVT

## 2019-11-27 ENCOUNTER — Ambulatory Visit (INDEPENDENT_AMBULATORY_CARE_PROVIDER_SITE_OTHER): Payer: Medicare Other | Admitting: *Deleted

## 2019-11-27 DIAGNOSIS — I5022 Chronic systolic (congestive) heart failure: Secondary | ICD-10-CM | POA: Diagnosis not present

## 2019-11-27 DIAGNOSIS — I255 Ischemic cardiomyopathy: Secondary | ICD-10-CM | POA: Insufficient documentation

## 2019-11-27 LAB — CUP PACEART REMOTE DEVICE CHECK
Battery Impedance: 1033 Ohm
Battery Remaining Longevity: 68 mo
Battery Voltage: 2.77 V
Brady Statistic RV Percent Paced: 16 %
Date Time Interrogation Session: 20210412091926
Implantable Lead Implant Date: 20080818
Implantable Lead Implant Date: 20080818
Implantable Lead Location: 753859
Implantable Lead Location: 753860
Implantable Lead Model: 4469
Implantable Lead Model: 4470
Implantable Lead Serial Number: 499409
Implantable Lead Serial Number: 602985
Implantable Pulse Generator Implant Date: 20150320
Lead Channel Impedance Value: 486 Ohm
Lead Channel Impedance Value: 67 Ohm
Lead Channel Pacing Threshold Amplitude: 1.375 V
Lead Channel Pacing Threshold Pulse Width: 0.4 ms
Lead Channel Setting Pacing Amplitude: 2.75 V
Lead Channel Setting Pacing Pulse Width: 0.4 ms
Lead Channel Setting Sensing Sensitivity: 5.6 mV

## 2019-11-28 ENCOUNTER — Other Ambulatory Visit: Payer: Self-pay

## 2019-11-28 ENCOUNTER — Encounter: Payer: Self-pay | Admitting: Internal Medicine

## 2019-11-28 ENCOUNTER — Encounter (INDEPENDENT_AMBULATORY_CARE_PROVIDER_SITE_OTHER): Payer: Self-pay

## 2019-11-28 ENCOUNTER — Ambulatory Visit (INDEPENDENT_AMBULATORY_CARE_PROVIDER_SITE_OTHER): Payer: Medicare Other | Admitting: Internal Medicine

## 2019-11-28 VITALS — BP 142/76 | HR 46 | Ht 68.0 in | Wt 152.4 lb

## 2019-11-28 DIAGNOSIS — I255 Ischemic cardiomyopathy: Secondary | ICD-10-CM | POA: Diagnosis not present

## 2019-11-28 DIAGNOSIS — Z950 Presence of cardiac pacemaker: Secondary | ICD-10-CM

## 2019-11-28 DIAGNOSIS — I482 Chronic atrial fibrillation, unspecified: Secondary | ICD-10-CM | POA: Diagnosis not present

## 2019-11-28 DIAGNOSIS — R3912 Poor urinary stream: Secondary | ICD-10-CM | POA: Diagnosis not present

## 2019-11-28 DIAGNOSIS — N401 Enlarged prostate with lower urinary tract symptoms: Secondary | ICD-10-CM | POA: Diagnosis not present

## 2019-11-28 NOTE — Patient Instructions (Signed)

## 2019-11-28 NOTE — Progress Notes (Signed)
PPM Remote  

## 2019-11-28 NOTE — Progress Notes (Signed)
Patient Care Team: [name], MD as PCP - General (Family Medicine) [name], MD as PCP - Cardiology (Cardiology) [name], MD as Consulting Physician (Cardiology)   HPI  Adam [name] is a 86 y.o. 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  His edema is much improved.  He has atrial fibrillation now considered permanent.  He has coronary disease and is status post CABG  The patient denies chest pain, shortness of breath, nocturnal dyspnea, orthopnea or peripheral edema.  There have been no palpitations, lightheadedness or syncope.    Has a nonhealing eschar on his right leg.  Non tender, persisting following a biopsy       DATE TEST EF   2015 Echo  25%   8/15 LHC EF 25%  LIMA/SVG-D1,RCA -patent SVG-OM-T  8/17 Echo   35 %         Date Cr K Hgb  6/18 0.94 3.9 12.7  8/19  0.97 4.8 13.0  9/20 1.06 4.1 12.1  1/21 1.00 3.9 12.7      Past Medical History:  Diagnosis Date  . AAA (abdominal aortic aneurysm) ([place])   . Anginal pain ([place])   . Arthritis   . CAD (coronary artery disease)    Adam [name] IS CARDIO  . CHF (congestive heart failure) ([place])   . Chronic anticoagulation   . Diverticulosis   . GERD (gastroesophageal reflux disease)   . Hyperlipidemia   . Hypertension   . LBP (low back pain)   . Permanent atrial fibrillation ([place])    decision made at [facility]  . Presence of permanent cardiac pacemaker   . S/P CABG (coronary artery bypass graft)   . Shortness of breath dyspnea   . Sleep apnea     Past Surgical History:  Procedure Laterality Date  . CARDIOVERSION  06/30/2012   Procedure: CARDIOVERSION;  Surgeon: Sinclair Grooms, MD;  Location: [facility] ENDOSCOPY;  Service: Cardiovascular;  Laterality: N/A;  . CARDIOVERSION N/A 11/23/2013   Procedure: CARDIOVERSION;  Surgeon:  Sinclair Grooms, MD;  Location: [place];  Service: Cardiovascular;  Laterality: N/A;  . CATARACT EXTRACTION W/ INTRAOCULAR LENS  IMPLANT, BILATERAL    . CORONARY ARTERY BYPASS GRAFT  Feb 1989   "CABG X6"  . ENDOVASCULAR STENT INSERTION  07/28/2011   Procedure: ENDOVASCULAR STENT GRAFT INSERTION;  Surgeon: Hinda Lenis, MD;  Location: [place];  Service: Vascular;  Laterality: N/A;  Pt. on Coumadin/ instructed to hold 5 days prior to procedure  . EYE SURGERY    . INGUINAL HERNIA REPAIR Right 01/04/2015  . INGUINAL HERNIA REPAIR Right 01/04/2015   Procedure:  OPEN REPAIR RIGHT INGUNIAL HERNIA;  Surgeon: Fanny Skates, MD;  Location: [place];  Service: General;  Laterality: Right;  . INSERT / REPLACE / REMOVE PACEMAKER  July 2008  . INSERTION OF MESH Right 01/04/2015   Procedure: INSERTION OF MESH;  Surgeon: Fanny Skates, MD;  Location: [place];  Service: General;  Laterality: Right;  . JOINT REPLACEMENT  1995 & 1996   Bilateral total knee replacements  . LEFT HEART CATHETERIZATION WITH CORONARY ANGIOGRAM N/A 03/28/2014   Procedure: LEFT HEART CATHETERIZATION WITH CORONARY ANGIOGRAM;  Surgeon: Sinclair Grooms, MD;  Location: [facility] CATH LAB;  Service: Cardiovascular;  Laterality: N/A;  .  PERMANENT PACEMAKER GENERATOR CHANGE N/A 11/03/2013   Procedure: PERMANENT PACEMAKER GENERATOR CHANGE;  Surgeon: [name], MD;  Location: [facility] CATH LAB;  Service: Cardiovascular;  Laterality: N/A;  . TONSILLECTOMY      Current Outpatient Medications  Medication Sig Dispense Refill  . acetaminophen (TYLENOL 8 HOUR ARTHRITIS PAIN) 650 MG CR tablet Take 650 mg by mouth every 8 (eight) hours as needed for pain.    [name] apixaban (ELIQUIS) 5 MG TABS tablet Take 5 mg by mouth 2 (two) times daily.    [name] atorvastatin (LIPITOR) 10 MG tablet Take 1 tablet (10 mg total) by mouth every evening. 90 tablet 2  . Cholecalciferol (VITAMIN D) 2000 UNITS CAPS Take 2,000 Units by mouth daily.    . finasteride (PROSCAR) 5 MG  tablet Take 5 mg by mouth daily.    . furosemide (LASIX) 40 MG tablet Take 40 mg by mouth. 60 mg daily; 80 mg on T,Th,Sat.    [name] ipratropium (ATROVENT) 0.06 % nasal spray     . latanoprost (XALATAN) 0.005 % ophthalmic solution Place 1 drop into both eyes at bedtime.    . Multiple Vitamins-Minerals (PRESERVISION AREDS 2+MULTI VIT PO) Take 1 capsule by mouth 2 (two) times daily.     . nitroGLYCERIN (NITROSTAT) 0.4 MG SL tablet Place 1 tablet (0.4 mg total) under the tongue every 5 (five) minutes as needed. For chest pain 25 tablet 5  . Omega-3 Fatty Acids (FISH OIL) 1000 MG CPDR Take 1 capsule by mouth daily.     . silodosin (RAPAFLO) 8 MG CAPS capsule Take 8 mg by mouth at bedtime.      No current facility-administered medications for this visit.    Allergies  Allergen Reactions  . Amlodipine Other (See Comments)  . Lisinopril     Elevated potassium  . Morphine Nausea Only  . Amiodarone Anxiety and Rash    Review of Systems negative except from HPI and PMH  Physical Exam BP (!) 142/76   Pulse (!) 46   Ht 5\' 8"  (1.727 m)   Wt 152 lb 6.4 oz (69.1 kg)   SpO2 95%   BMI 23.17 kg/m  Well developed and well nourished in no acute distress HENT normal Neck supple with JVP-flat Clear Device pocket well healed; without hematoma or erythema.  There is no tethering  Regular rate and rhythm, no murmur Abd-soft with active BS No Clubbing cyanosis 1+  edema Skin-warm and dry A & Oriented  Grossly normal sensory and motor function  ECG  Atrial fib @ 46 -/09/44 occ V pacing   Assessment and  Plan   Atrial fibrillation-permanent  Bradycardia  Ischemic cardiomyopathy  HFrEF   Pacemaker-Medtronic  The patient's device was interrogated.  The information was reviewed. No changes were made in the programming.     Leg wound  Without symptoms of ischemia  Some lower extremity swelling and erythema around the eschar but not warm, so dont think it is infected.  Have suggested though  that he reach out to his PCP regarding a wound clinic referraly  Bradycardia I dont hink is significant VP @ 16% at LRL of 40  But excursion is ok   Afib permanent, continue Apixoban    Dose appropriate but certainly assoc with superficial bleeding

## 2019-11-30 ENCOUNTER — Encounter: Payer: Self-pay | Admitting: Podiatry

## 2019-11-30 ENCOUNTER — Other Ambulatory Visit: Payer: Self-pay

## 2019-11-30 ENCOUNTER — Ambulatory Visit (INDEPENDENT_AMBULATORY_CARE_PROVIDER_SITE_OTHER): Payer: Medicare Other | Admitting: Podiatry

## 2019-11-30 DIAGNOSIS — D689 Coagulation defect, unspecified: Secondary | ICD-10-CM | POA: Diagnosis not present

## 2019-11-30 DIAGNOSIS — B351 Tinea unguium: Secondary | ICD-10-CM

## 2019-11-30 DIAGNOSIS — Q828 Other specified congenital malformations of skin: Secondary | ICD-10-CM

## 2019-11-30 DIAGNOSIS — M79676 Pain in unspecified toe(s): Secondary | ICD-10-CM | POA: Diagnosis not present

## 2019-11-30 NOTE — Progress Notes (Signed)
He presents today chief complaint of painfully elongated toenails.  Objective: Pulses are palpable.  But barely so.  Capillary fill time is sluggish.  No open lesions or wounds to his feet.  Toenails are long thick yellow dystrophic-like mycotic and painful.  Assessment: Pain in limb secondary to peripheral vascular disease and painful toenails.  Plan: Pigmented toenails one through five bilateral.

## 2019-12-15 LAB — CUP PACEART INCLINIC DEVICE CHECK
Battery Impedance: 1031 Ohm
Battery Remaining Longevity: 64 mo
Battery Voltage: 2.77 V
Brady Statistic RV Percent Paced: 16 %
Date Time Interrogation Session: 20210413144400
Implantable Lead Implant Date: 20080818
Implantable Lead Implant Date: 20080818
Implantable Lead Location: 753859
Implantable Lead Location: 753860
Implantable Lead Model: 4469
Implantable Lead Model: 4470
Implantable Lead Serial Number: 499409
Implantable Lead Serial Number: 602985
Implantable Pulse Generator Implant Date: 20150320
Lead Channel Impedance Value: 448 Ohm
Lead Channel Impedance Value: 67 Ohm
Lead Channel Pacing Threshold Amplitude: 1.25 V
Lead Channel Pacing Threshold Pulse Width: 0.4 ms
Lead Channel Sensing Intrinsic Amplitude: 15.67 mV
Lead Channel Setting Pacing Amplitude: 2.5 V
Lead Channel Setting Pacing Pulse Width: 0.4 ms
Lead Channel Setting Sensing Sensitivity: 5.6 mV

## 2019-12-20 ENCOUNTER — Telehealth: Payer: Self-pay

## 2019-12-20 NOTE — Telephone Encounter (Signed)
   [facility] Pre-operative Risk Assessment    Request for surgical clearance:  1. What type of surgery is being performed? CAUDAL ESI  2. When is this surgery scheduled? 01/04/20   3. What type of clearance is required (medical clearance vs. Pharmacy clearance to hold med vs. Both)? PHARMACY  4. Are there any medications that need to be held prior to surgery and how long? ELIQUIS X 3 DAYS PRIOR TO SURGERY   5. Practice name and name of physician performing surgery? [facility]; DR. Nelva Bush   6. What is your office phone number [phone]    7.   What is your office fax number [phone] ATTN: SUSAN  8.   Anesthesia type (None, local, MAC, general) ? NONE LISTED   Adam [name] 12/20/2019, 2:55 PM  _________________________________________________________________   (provider comments below)

## 2019-12-21 NOTE — Telephone Encounter (Signed)
Pharmacy please comment regarding holding Eliquis in this patient with Afib prior to [facility]. Please route response to P CV DIV PREOP, thanks!

## 2019-12-21 NOTE — Telephone Encounter (Signed)
Patient with diagnosis of afib on Eliquis for anticoagulation.    Procedure: CAUDAL ESI Date of procedure: 01/04/20  CHADS2-VASc score of  5 (CHF, HTN, AGE,  CAD, AGE)  CrCl 45 ml/min  Per office protocol, patient can hold Eliquis for 3 days prior to procedure.

## 2019-12-21 NOTE — Telephone Encounter (Signed)
Patient's wife is returning call in regards to clearance.

## 2019-12-21 NOTE — Telephone Encounter (Signed)
Left VM for patient to call back

## 2019-12-22 NOTE — Telephone Encounter (Signed)
   Primary Cardiologist: Sinclair Grooms, MD  Chart reviewed as part of pre-operative protocol coverage. Patient was contacted 12/22/2019 in reference to pre-operative risk assessment for pending surgery as outlined below.  PATRYK WILKS was last seen on 11/28/19 by Dr. Caryl Comes.  Since that day, XYAIRE GOLDNER has done well. We have been asked for guidance to hold eliquis.  Per our clinical pharmacist: Patient with diagnosis of afib on Eliquis for anticoagulation.    Procedure: CAUDAL ESI Date of procedure: 01/04/20  CHADS2-VASc score of  5 (CHF, HTN, AGE,  CAD, AGE)  CrCl 45 ml/min  Per office protocol, patient can hold Eliquis for 3 days prior to procedure.     I will route this recommendation to the requesting party via Epic fax function and remove from pre-op pool.  Please call with questions.  [place], PA 12/22/2019, 9:16 AM

## 2020-01-04 DIAGNOSIS — M5136 Other intervertebral disc degeneration, lumbar region: Secondary | ICD-10-CM | POA: Diagnosis not present

## 2020-02-06 ENCOUNTER — Telehealth: Payer: Self-pay | Admitting: Interventional Cardiology

## 2020-02-06 NOTE — Telephone Encounter (Signed)
Wife states pt has been having terrible pain at night in both legs that prevents him from sleeping well.  Swelling in both legs for over a year now.  Right is worse than left, which is new.  Pt had similar issues in April and was checked for DVT but was negative.  Denies heat or redness to legs.  Swelling does not improve with elevation.  Pt states it actually worsens.  Vitals this morning were 137-141/55-56, HR 55-59.  They checked vitals 3x.  Current Furosemide dose is 60mg  M,W,F, Sun and 80mg  T, Th and Sat.  Advised I will send to Dr. [name] for review.

## 2020-02-06 NOTE — Telephone Encounter (Signed)
Pt c/o swelling: STAT is pt has developed SOB within 24 hours  1) How much weight have you gained and in what time span? Has lost weight, 6 months lost maybe 10 lbs  2) If swelling, where is the swelling located? Legs and ankles, pain at night when in bed  3) Are you currently taking a fluid pill? yes  4) Are you currently SOB? Little SOB in the last few weeks  5) Do you have a log of your daily weights (if so, list)? no  6) Have you gained 3 pounds in a day or 5 pounds in a week? no  7) Have you traveled recently? No   Patient's wife states the patient has swelling and pain in his legs and ankles. She states he has been having a little bit of SOB in the last few weeks and he has fatigue. She states he has not gained weight, but has lost about 10 lbs in 6 months.

## 2020-02-07 NOTE — Telephone Encounter (Signed)
Increase furosemide to 80 mg BID for 2 days then decrease to 80 mg daily thereafter. BMET Monday.

## 2020-02-09 NOTE — Telephone Encounter (Signed)
Attempted to contact pt or wife.  Phone rang several times with no answer and no VM.  Will try again later.

## 2020-02-12 NOTE — Telephone Encounter (Signed)
Spoke with wife and made her aware of recommendations.  Pt will take Furosemide 80mg  BID today and tomorrow and then see Dr.Smith on Wednesday and have labs.  Wife appreciative for call.

## 2020-02-14 ENCOUNTER — Ambulatory Visit (INDEPENDENT_AMBULATORY_CARE_PROVIDER_SITE_OTHER): Payer: Medicare Other | Admitting: Interventional Cardiology

## 2020-02-14 ENCOUNTER — Encounter: Payer: Self-pay | Admitting: Interventional Cardiology

## 2020-02-14 ENCOUNTER — Other Ambulatory Visit: Payer: Self-pay

## 2020-02-14 VITALS — BP 120/60 | HR 53 | Ht 68.0 in | Wt 150.0 lb

## 2020-02-14 DIAGNOSIS — I255 Ischemic cardiomyopathy: Secondary | ICD-10-CM

## 2020-02-14 DIAGNOSIS — Z7189 Other specified counseling: Secondary | ICD-10-CM

## 2020-02-14 DIAGNOSIS — Z5181 Encounter for therapeutic drug level monitoring: Secondary | ICD-10-CM

## 2020-02-14 DIAGNOSIS — I482 Chronic atrial fibrillation, unspecified: Secondary | ICD-10-CM | POA: Diagnosis not present

## 2020-02-14 DIAGNOSIS — I495 Sick sinus syndrome: Secondary | ICD-10-CM

## 2020-02-14 DIAGNOSIS — Z950 Presence of cardiac pacemaker: Secondary | ICD-10-CM

## 2020-02-14 DIAGNOSIS — I5022 Chronic systolic (congestive) heart failure: Secondary | ICD-10-CM

## 2020-02-14 DIAGNOSIS — Z9889 Other specified postprocedural states: Secondary | ICD-10-CM | POA: Diagnosis not present

## 2020-02-14 DIAGNOSIS — Z8679 Personal history of other diseases of the circulatory system: Secondary | ICD-10-CM

## 2020-02-14 NOTE — Progress Notes (Signed)
Cardiology Office Note:    Date:  02/14/2020   ID:  Adam [name], DOB 11/17/1924, [name] [id number]  PCP:  [name], MD  Cardiologist:  Sinclair Grooms, MD   Referring MD: [name], MD   Chief Complaint  Patient presents with  . Congestive Heart Failure  . Coronary Artery Disease  . Atrial Fibrillation    History of Present Illness:    Adam [name] is a 86 y.o. male with a hx of ischemic cardiomyopathy,permanent pacemaker,chronic systolic heart failure, chronic atrial fibrillation, CAD with bypass graft failure (saphenous vein graft to the obtuse marginal), hypertension, chronic anticoagulation therapy, hypertension, and hyperlipidemia. EF is 35% per echo from 03/2016.Adam [name]  He is accompanied by his wife.  No specific complaints other than chronic lower extremity swelling and easy bruising.  No chest pain, orthopnea, PND, or angina.  No nitroglycerin use.  Appetite is been stable.  He has mild dyspnea when he first lies down.  He called last week because of lower extremity edema that was progressive.  We gave 2 days of 80 mg furosemide twice daily and then back to his baseline of 60 mg alternated with 80 mg.  Past Medical History:  Diagnosis Date  . AAA (abdominal aortic aneurysm) ([place])   . Anginal pain ([place])   . Arthritis   . CAD (coronary artery disease)    HANK Sheridan Gettel IS CARDIO  . CHF (congestive heart failure) ([place])   . Chronic anticoagulation   . Diverticulosis   . GERD (gastroesophageal reflux disease)   . Hyperlipidemia   . Hypertension   . LBP (low back pain)   . Permanent atrial fibrillation ([place])    decision made at [facility]  . Presence of permanent cardiac pacemaker   . S/P CABG (coronary artery bypass graft)   . Shortness of breath dyspnea   . Sleep apnea     Past Surgical History:  Procedure Laterality Date  . CARDIOVERSION  06/30/2012   Procedure: CARDIOVERSION;  Surgeon: Sinclair Grooms, MD;  Location[facility] ENDOSCOPY;  Service:  Cardiovascular;  Laterality: N/A;  . CARDIOVERSION N/A 11/23/2013   Procedure: CARDIOVERSION;  Surgeon: Sinclair Grooms, MD;  Location: [place];  Service: Cardiovascular;  Laterality: N/A;  . CATARACT EXTRACTION W/ INTRAOCULAR LENS  IMPLANT, BILATERAL    . CORONARY ARTERY BYPASS GRAFT  Feb 1989   "CABG X6"  . ENDOVASCULAR STENT INSERTION  07/28/2011   Procedure: ENDOVASCULAR STENT GRAFT INSERTION;  Surgeon: Hinda Lenis, MD;  Location: [place];  Service: Vascular;  Laterality: N/A;  Pt. on Coumadin/ instructed to hold 5 days prior to procedure  . EYE SURGERY    . INGUINAL HERNIA REPAIR Right 01/04/2015  . INGUINAL HERNIA REPAIR Right 01/04/2015   Procedure:  OPEN REPAIR RIGHT INGUNIAL HERNIA;  Surgeon: Fanny Skates, MD;  Location: [place];  Service: General;  Laterality: Right;  . INSERT / REPLACE / REMOVE PACEMAKER  July 2008  . INSERTION OF MESH Right 01/04/2015   Procedure: INSERTION OF MESH;  Surgeon: Fanny Skates, MD;  Location: [place];  Service: General;  Laterality: Right;  . JOINT REPLACEMENT  1995 & 1996   Bilateral total knee replacements  . LEFT HEART CATHETERIZATION WITH CORONARY ANGIOGRAM N/A 03/28/2014   Procedure: LEFT HEART CATHETERIZATION WITH CORONARY ANGIOGRAM;  Surgeon: Sinclair Grooms, MD;  Location: [facility] CATH LAB;  Service: Cardiovascular;  Laterality: N/A;  . PERMANENT PACEMAKER GENERATOR CHANGE N/A 11/03/2013  Procedure: PERMANENT PACEMAKER GENERATOR CHANGE;  Surgeon: [name], MD;  Location: [facility] CATH LAB;  Service: Cardiovascular;  Laterality: N/A;  . TONSILLECTOMY      Current Medications: Current Meds  Medication Sig  . acetaminophen (TYLENOL 8 HOUR ARTHRITIS PAIN) 650 MG CR tablet Take 650 mg by mouth every 8 (eight) hours as needed for pain.  Adam [name] apixaban (ELIQUIS) 5 MG TABS tablet Take 5 mg by mouth 2 (two) times daily.  Adam [name] atorvastatin (LIPITOR) 10 MG tablet Take 1 tablet (10 mg total) by mouth every evening.  . Cholecalciferol (VITAMIN D) 2000  UNITS CAPS Take 2,000 Units by mouth daily.  . finasteride (PROSCAR) 5 MG tablet Take 5 mg by mouth daily.  . furosemide (LASIX) 40 MG tablet Take 40 mg by mouth. 60 mg daily; 80 mg on T,Th,Sat.  Adam [name] ipratropium (ATROVENT) 0.06 % nasal spray   . latanoprost (XALATAN) 0.005 % ophthalmic solution Place 1 drop into both eyes at bedtime.  . Multiple Vitamins-Minerals (PRESERVISION AREDS 2+MULTI VIT PO) Take 1 capsule by mouth 2 (two) times daily.   . nitroGLYCERIN (NITROSTAT) 0.4 MG SL tablet Place 1 tablet (0.4 mg total) under the tongue every 5 (five) minutes as needed. For chest pain  . Omega-3 Fatty Acids (FISH OIL) 1000 MG CPDR Take 1 capsule by mouth daily.   . silodosin (RAPAFLO) 8 MG CAPS capsule Take 8 mg by mouth at bedtime.      Allergies:   Amlodipine, Lisinopril, Morphine, and Amiodarone   Social History   Socioeconomic History  . Marital status: Married    Spouse name: Not on file  . Number of children: Not on file  . Years of education: Not on file  . Highest education level: Not on file  Occupational History  . Not on file  Tobacco Use  . Smoking status: Former Smoker    Years: 3.00    Types: Cigarettes    Quit date: 08/17/1948    Years since quitting: 71.5  . Smokeless tobacco: Never Used  Vaping Use  . Vaping Use: Never used  Substance and Sexual Activity  . Alcohol use: No  . Drug use: No  . Sexual activity: Not on file  Other Topics Concern  . Not on file  Social History Narrative  . Not on file   Social Determinants of Health   Financial Resource Strain:   . Difficulty of Paying Living Expenses:   Food Insecurity:   . Worried About Charity fundraiser in the Last Year:   . Arboriculturist in the Last Year:   Transportation Needs:   . Film/video editor (Medical):   Adam [name] Lack of Transportation (Non-Medical):   Physical Activity:   . Days of Exercise per Week:   . Minutes of Exercise per Session:   Stress:   . Feeling of Stress :   Social  Connections:   . Frequency of Communication with Friends and Family:   . Frequency of Social Gatherings with Friends and Family:   . Attends Religious Services:   . Active Member of Clubs or Organizations:   . Attends Archivist Meetings:   Adam [name] Marital Status:      Family History: The patient's family history includes Cancer in his sister; Heart attack in his father; Heart disease in his father and sister; Heart failure in his mother; Other in his sister; Stroke in his mother; Varicose Veins in his mother.  ROS:   Please see the  history of present illness.    He gets up once each night because of prostate trouble.  He is weaker than he used to be.  No longer plays golf.  Appetite is been stable.  Chronic low back discomfort.  Feet feel numb.  He saw the podiatry service because of the numb feet and they told him he had neuropathy.  All other systems reviewed and are negative.  EKGs/Labs/Other Studies Reviewed:    The following studies were reviewed today: No new data  EKG:  EKG atrial fibrillation with controlled ventricular response of 53 bpm.  T wave flattening/inversion 1, aVL, and V1 and V2.  When compared to prior tracings from 11/29/2019, heart rate is faster.  No instances of pacing are noted on today's tracing.  Recent Labs: 08/31/2019: BUN 22; Creatinine, Ser 1.00; Hemoglobin 12.7; NT-Pro BNP 900; Platelets 150; Potassium 3.9; Sodium 141  Recent Lipid Panel    Component Value Date/Time   CHOL 138 01/03/2018 1146   TRIG 86 01/03/2018 1146   HDL 53 01/03/2018 1146   CHOLHDL 2.6 01/03/2018 1146   LDLCALC 68 01/03/2018 1146    Physical Exam:    VS:  BP 120/60   Pulse (!) 53   Ht 5\' 8"  (1.727 m)   Wt 150 lb (68 kg)   SpO2 98%   BMI 22.81 kg/m     Wt Readings from Last 3 Encounters:  02/14/20 150 lb (68 kg)  11/28/19 152 lb 6.4 oz (69.1 kg)  09/12/19 151 lb (68.5 kg)     GEN: Appears compatible with stated age.. No acute distress HEENT: Normal NECK: No  JVD. LYMPHATICS: No lymphadenopathy CARDIAC: Irregularly irregular with bradycardia without murmur, gallop, but there is presence of bilateral ankle edema with skin lichenification greater on right than left Edema. VASCULAR:  Normal Pulses. No bruits. RESPIRATORY:  Clear to auscultation without rales, wheezing or rhonchi  ABDOMEN: Soft, non-tender, non-distended, No pulsatile mass, MUSCULOSKELETAL: No deformity  SKIN: Warm and dry NEUROLOGIC:  Alert and oriented x 3 PSYCHIATRIC:  Normal affect   ASSESSMENT:    1. Chronic atrial fibrillation ([place])   2. Chronic systolic heart failure ([place])   3. PACEMAKER, PERMANENT-Medtronic   4. S/P AAA (abdominal aortic aneurysm) repair   5. Sinus brady-tachy syndrome ([place])   6. Encounter for therapeutic drug monitoring   7. Educated about COVID-19 virus infection    PLAN:    In order of problems listed above:  1. Atrial fibrillation with slow ventricular response unchanged. 2. Volume overload has resolved with brief increase in diuretic intensity.  We will resort back to furosemide 60 mg alternating with 80 mg every other day. 3. Pacemaker is set at a rate that is less than 50 bpm. 4. We did not discuss his abdominal aorta. 5. Pacemaker function is to avoid excessive bradycardia in this patient with known tachybradycardia syndrome. 6. Continue Eliquis therapy to prevent stroke. 7. COVID-19 vaccine is been received.   Medication Adjustments/Labs and Tests Ordered: Current medicines are reviewed at length with the patient today.  Concerns regarding medicines are outlined above.  Orders Placed This Encounter  Procedures  . EKG 12-Lead   No orders of the defined types were placed in this encounter.   There are no Patient Instructions on file for this visit.   Signed, Sinclair Grooms, MD  02/14/2020 3:32 PM    [place]

## 2020-02-14 NOTE — Patient Instructions (Signed)

## 2020-02-26 ENCOUNTER — Ambulatory Visit (INDEPENDENT_AMBULATORY_CARE_PROVIDER_SITE_OTHER): Payer: Medicare Other | Admitting: *Deleted

## 2020-02-26 DIAGNOSIS — I255 Ischemic cardiomyopathy: Secondary | ICD-10-CM

## 2020-02-26 LAB — CUP PACEART REMOTE DEVICE CHECK
Battery Impedance: 1114 Ohm
Battery Remaining Longevity: 65 mo
Battery Voltage: 2.76 V
Brady Statistic RV Percent Paced: 19 %
Date Time Interrogation Session: 20210712111609
Implantable Lead Implant Date: 20080818
Implantable Lead Implant Date: 20080818
Implantable Lead Location: 753859
Implantable Lead Location: 753860
Implantable Lead Model: 4469
Implantable Lead Model: 4470
Implantable Lead Serial Number: 499409
Implantable Lead Serial Number: 602985
Implantable Pulse Generator Implant Date: 20150320
Lead Channel Impedance Value: 484 Ohm
Lead Channel Impedance Value: 67 Ohm
Lead Channel Pacing Threshold Amplitude: 1.375 V
Lead Channel Pacing Threshold Pulse Width: 0.4 ms
Lead Channel Setting Pacing Amplitude: 2.75 V
Lead Channel Setting Pacing Pulse Width: 0.4 ms
Lead Channel Setting Sensing Sensitivity: 5.6 mV

## 2020-02-27 NOTE — Progress Notes (Signed)
Remote pacemaker transmission.   

## 2020-03-05 ENCOUNTER — Ambulatory Visit (INDEPENDENT_AMBULATORY_CARE_PROVIDER_SITE_OTHER): Payer: Medicare Other | Admitting: Podiatry

## 2020-03-05 ENCOUNTER — Other Ambulatory Visit: Payer: Self-pay

## 2020-03-05 ENCOUNTER — Encounter: Payer: Self-pay | Admitting: Podiatry

## 2020-03-05 DIAGNOSIS — M7741 Metatarsalgia, right foot: Secondary | ICD-10-CM

## 2020-03-05 DIAGNOSIS — Q828 Other specified congenital malformations of skin: Secondary | ICD-10-CM | POA: Diagnosis not present

## 2020-03-05 DIAGNOSIS — M7742 Metatarsalgia, left foot: Secondary | ICD-10-CM

## 2020-03-05 DIAGNOSIS — M79676 Pain in unspecified toe(s): Secondary | ICD-10-CM | POA: Diagnosis not present

## 2020-03-05 DIAGNOSIS — B351 Tinea unguium: Secondary | ICD-10-CM

## 2020-03-05 DIAGNOSIS — D689 Coagulation defect, unspecified: Secondary | ICD-10-CM | POA: Diagnosis not present

## 2020-03-06 DIAGNOSIS — I4891 Unspecified atrial fibrillation: Secondary | ICD-10-CM | POA: Diagnosis not present

## 2020-03-06 DIAGNOSIS — Z0000 Encounter for general adult medical examination without abnormal findings: Secondary | ICD-10-CM | POA: Diagnosis not present

## 2020-03-06 DIAGNOSIS — Z79899 Other long term (current) drug therapy: Secondary | ICD-10-CM | POA: Diagnosis not present

## 2020-03-06 DIAGNOSIS — E039 Hypothyroidism, unspecified: Secondary | ICD-10-CM | POA: Diagnosis not present

## 2020-03-06 DIAGNOSIS — M79675 Pain in left toe(s): Secondary | ICD-10-CM | POA: Diagnosis not present

## 2020-03-06 DIAGNOSIS — E785 Hyperlipidemia, unspecified: Secondary | ICD-10-CM | POA: Diagnosis not present

## 2020-03-06 DIAGNOSIS — D6869 Other thrombophilia: Secondary | ICD-10-CM | POA: Diagnosis not present

## 2020-03-06 NOTE — Progress Notes (Signed)
He presents today chief complaint of painful elongated calluses and nails bilaterally.  Objective: Vital signs are stable he is alert and oriented x3 he has pain on palpation to the forefoot bilaterally his toenails are long thick yellow dystrophic Lee mycotic with multiple very thin calluses bilateral forefoot.  Assessment: Metatarsalgia with reactive hyper keratomas bilateral foot painful onychomycosis.  Plan: Discussed etiology pathology and surgical therapies debrided toenails 1 through 5 bilaterally debrided all reactive hyperkeratotic tissue no iatrogenic lesions.  This was debrided with a chisel blade.  I also placed him in silicone metatarsal pads.

## 2020-03-11 ENCOUNTER — Ambulatory Visit: Payer: Medicare Other | Admitting: Interventional Cardiology

## 2020-03-13 DIAGNOSIS — H401132 Primary open-angle glaucoma, bilateral, moderate stage: Secondary | ICD-10-CM | POA: Diagnosis not present

## 2020-03-15 DIAGNOSIS — D225 Melanocytic nevi of trunk: Secondary | ICD-10-CM | POA: Diagnosis not present

## 2020-03-15 DIAGNOSIS — L218 Other seborrheic dermatitis: Secondary | ICD-10-CM | POA: Diagnosis not present

## 2020-03-15 DIAGNOSIS — L821 Other seborrheic keratosis: Secondary | ICD-10-CM | POA: Diagnosis not present

## 2020-03-15 DIAGNOSIS — D692 Other nonthrombocytopenic purpura: Secondary | ICD-10-CM | POA: Diagnosis not present

## 2020-03-15 DIAGNOSIS — Z85828 Personal history of other malignant neoplasm of skin: Secondary | ICD-10-CM | POA: Diagnosis not present

## 2020-03-15 DIAGNOSIS — L814 Other melanin hyperpigmentation: Secondary | ICD-10-CM | POA: Diagnosis not present

## 2020-03-15 DIAGNOSIS — L570 Actinic keratosis: Secondary | ICD-10-CM | POA: Diagnosis not present

## 2020-04-04 DIAGNOSIS — H43813 Vitreous degeneration, bilateral: Secondary | ICD-10-CM | POA: Diagnosis not present

## 2020-04-04 DIAGNOSIS — H401132 Primary open-angle glaucoma, bilateral, moderate stage: Secondary | ICD-10-CM | POA: Diagnosis not present

## 2020-04-04 DIAGNOSIS — Z961 Presence of intraocular lens: Secondary | ICD-10-CM | POA: Diagnosis not present

## 2020-04-04 DIAGNOSIS — H353132 Nonexudative age-related macular degeneration, bilateral, intermediate dry stage: Secondary | ICD-10-CM | POA: Diagnosis not present

## 2020-04-15 DIAGNOSIS — H5202 Hypermetropia, left eye: Secondary | ICD-10-CM | POA: Diagnosis not present

## 2020-04-15 DIAGNOSIS — Z961 Presence of intraocular lens: Secondary | ICD-10-CM | POA: Diagnosis not present

## 2020-04-15 DIAGNOSIS — H401132 Primary open-angle glaucoma, bilateral, moderate stage: Secondary | ICD-10-CM | POA: Diagnosis not present

## 2020-04-16 ENCOUNTER — Encounter: Payer: Self-pay | Admitting: Vascular Surgery

## 2020-04-16 ENCOUNTER — Ambulatory Visit (INDEPENDENT_AMBULATORY_CARE_PROVIDER_SITE_OTHER): Payer: Medicare Other | Admitting: Vascular Surgery

## 2020-04-16 ENCOUNTER — Ambulatory Visit (HOSPITAL_COMMUNITY)
Admission: RE | Admit: 2020-04-16 | Discharge: 2020-04-16 | Disposition: A | Discharge status: Home / Self Care | Payer: Medicare Other | Source: Ambulatory Visit | Attending: Cardiovascular Disease | Admitting: Cardiovascular Disease

## 2020-04-16 ENCOUNTER — Other Ambulatory Visit: Payer: Self-pay

## 2020-04-16 VITALS — BP 147/71 | HR 52 | Temp 98.0°F | Resp 16 | Ht 68.0 in | Wt 150.0 lb

## 2020-04-16 DIAGNOSIS — Z9889 Other specified postprocedural states: Secondary | ICD-10-CM | POA: Diagnosis not present

## 2020-04-16 DIAGNOSIS — Z95828 Presence of other vascular implants and grafts: Secondary | ICD-10-CM | POA: Diagnosis not present

## 2020-04-16 DIAGNOSIS — Z8679 Personal history of other diseases of the circulatory system: Secondary | ICD-10-CM | POA: Diagnosis not present

## 2020-04-16 DIAGNOSIS — I714 Abdominal aortic aneurysm, without rupture: Secondary | ICD-10-CM | POA: Diagnosis not present

## 2020-04-16 NOTE — Progress Notes (Signed)
Patient name: Adam [name] [name]: [id number] DOB: 1925/01/25 Sex: male  REASON FOR VISIT: 6 month follow-up for surveillance of enlarging aneurysm sac status post EVAR in 2012  HPI: Adam [name] is a 86 y.o. male with history of coronary disease status post CABG, atrial fibrillation on DOAC, hypertension, hyperlipidemia, congestive heart failure, previous abdominal aortic aneurysm status post EVAR in 2012 with Dr. Bridgett [name].  He was seen late last year in 05/2019 by the nurse practitioner for further surveillance.  His aneurysm was noted to increase from 4.7 to 5.4 cm.  There was no endoleak identified.  He was subsequently sent to see me last year after visit with our NP.  I initially discussed plans for CTA for further evaluation.  Patient was very hesitant about a CT scan given a very poor experience in the past with IV access while on coumadin.  Ultimately elected 3 month follow-up with another duplex for ongoing surveillance.  Last duplex on 09/12/19 at 3 month follow-up showed minimal growth from 5.4 cm to 5.5 cm.  He presents for 52-month follow-up today.  He reports no abdominal pain.  Has chronic lower back pain is unchanged.  Has had degeneration in his eyesight with macular degeneration.  Only other concerns has been swelling in his lower extremities along with burning in his feet that keeps him awake at night.   Past Medical History:  Diagnosis Date  . AAA (abdominal aortic aneurysm) ([place])   . Anginal pain ([place])   . Arthritis   . CAD (coronary artery disease)    Adam [name] IS CARDIO  . CHF (congestive heart failure) ([place])   . Chronic anticoagulation   . Diverticulosis   . GERD (gastroesophageal reflux disease)   . Hyperlipidemia   . Hypertension   . LBP (low back pain)   . Permanent atrial fibrillation ([place])    decision made at [facility]  . Presence of permanent cardiac pacemaker   . S/P CABG (coronary artery bypass graft)   . Shortness of breath dyspnea   . Sleep apnea      Past Surgical History:  Procedure Laterality Date  . CARDIOVERSION  06/30/2012   Procedure: CARDIOVERSION;  Surgeon: Adam Grooms, MD;  Location: [facility] ENDOSCOPY;  Service: Cardiovascular;  Laterality: N/A;  . CARDIOVERSION N/A 11/23/2013   Procedure: CARDIOVERSION;  Surgeon: Adam Grooms, MD;  Location: [place];  Service: Cardiovascular;  Laterality: N/A;  . CATARACT EXTRACTION W/ INTRAOCULAR LENS  IMPLANT, BILATERAL    . CORONARY ARTERY BYPASS GRAFT  Feb 1989   "CABG X6"  . ENDOVASCULAR STENT INSERTION  07/28/2011   Procedure: ENDOVASCULAR STENT GRAFT INSERTION;  Surgeon: Hinda Lenis, MD;  Location: [place];  Service: Vascular;  Laterality: N/A;  Pt. on Coumadin/ instructed to hold 5 days prior to procedure  . EYE SURGERY    . INGUINAL HERNIA REPAIR Right 01/04/2015  . INGUINAL HERNIA REPAIR Right 01/04/2015   Procedure:  OPEN REPAIR RIGHT INGUNIAL HERNIA;  Surgeon: Adam Skates, MD;  Location: [place];  Service: General;  Laterality: Right;  . INSERT / REPLACE / REMOVE PACEMAKER  July 2008  . INSERTION OF MESH Right 01/04/2015   Procedure: INSERTION OF MESH;  Surgeon: Adam Skates, MD;  Location: [place];  Service: General;  Laterality: Right;  . JOINT REPLACEMENT  1995 & 1996   Bilateral total knee replacements  . LEFT HEART CATHETERIZATION WITH CORONARY ANGIOGRAM N/A 03/28/2014   Procedure: LEFT HEART CATHETERIZATION WITH  CORONARY ANGIOGRAM;  Surgeon: Adam Grooms, MD;  Location[facility] C[facility] LAB;  Service: Cardiovascular;  Laterality: N/A;  . PERMANENT PACEMAKER GENERATOR CHANGE N/A 11/03/2013   Procedure: PERMANENT PACEMAKER GENERATOR CHANGE;  Surgeon: Adam Sprang, MD;  Location: [facility] CATH LAB;  Service: Cardiovascular;  Laterality: N/A;  . TONSILLECTOMY      Family History  Problem Relation Age of Onset  . Stroke Mother   . Heart failure Mother   . Varicose Veins Mother   . Heart attack Father   . Heart disease Father        Before age 26    Heart Attack  age 75 and 66  . Cancer Sister        breast  . Heart disease Sister        After age 56  . Other Sister        DJD    SOCIAL HISTORY: Social History   Tobacco Use  . Smoking status: Former Smoker    Years: 3.00    Types: Cigarettes    Quit date: 08/17/1948    Years since quitting: 71.7  . Smokeless tobacco: Never Used  Substance Use Topics  . Alcohol use: No    Allergies  Allergen Reactions  . Amlodipine Other (See Comments)  . Lisinopril     Elevated potassium  . Morphine Nausea Only  . Amiodarone Anxiety and Rash    Current Outpatient Medications  Medication Sig Dispense Refill  . acetaminophen (TYLENOL 8 HOUR ARTHRITIS PAIN) 650 MG CR tablet Take 650 mg by mouth every 8 (eight) hours as needed for pain.    [name] apixaban (ELIQUIS) 5 MG TABS tablet Take 5 mg by mouth 2 (two) times daily.    [name] atorvastatin (LIPITOR) 10 MG tablet Take 1 tablet (10 mg total) by mouth every evening. 90 tablet 2  . Cholecalciferol (VITAMIN D) 2000 UNITS CAPS Take 2,000 Units by mouth daily.    . finasteride (PROSCAR) 5 MG tablet Take 5 mg by mouth daily.    . furosemide (LASIX) 40 MG tablet Take 40 mg by mouth. 60 mg daily; 80 mg on T,Th,Sat.    [name] ipratropium (ATROVENT) 0.06 % nasal spray     . latanoprost (XALATAN) 0.005 % ophthalmic solution Place 1 drop into both eyes at bedtime.    . Multiple Vitamins-Minerals (PRESERVISION AREDS 2+MULTI VIT PO) Take 1 capsule by mouth 2 (two) times daily.     . nitroGLYCERIN (NITROSTAT) 0.4 MG SL tablet Place 1 tablet (0.4 mg total) under the tongue every 5 (five) minutes as needed. For chest pain 25 tablet 5  . Omega-3 Fatty Acids (FISH OIL) 1000 MG CPDR Take 1 capsule by mouth daily.     . silodosin (RAPAFLO) 8 MG CAPS capsule Take 8 mg by mouth at bedtime.      No current facility-administered medications for this visit.    REVIEW OF SYSTEMS:  [X]  denotes positive finding, [ ]  denotes negative finding Cardiac  Comments:  Chest pain or chest  pressure:    Shortness of breath upon exertion:    Short of breath when lying flat:    Irregular heart rhythm:        Vascular    Pain in calf, thigh, or hip brought on by ambulation:    Pain in feet at night that wakes you up from your sleep:  x   Blood clot in your veins:    Leg swelling:  Pulmonary    Oxygen at home:    Productive cough:     Wheezing:         Neurologic    Sudden weakness in arms or legs:     Sudden numbness in arms or legs:     Sudden onset of difficulty speaking or slurred speech:    Temporary loss of vision in one eye:     Problems with dizziness:         Gastrointestinal    Blood in stool:     Vomited blood:         Genitourinary    Burning when urinating:     Blood in urine:        Psychiatric    Major depression:         Hematologic    Bleeding problems:    Problems with blood clotting too easily:        Skin    Rashes or ulcers:        Constitutional    Fever or chills:      PHYSICAL EXAM: There were no vitals filed for this visit.  GENERAL: The patient is a well-nourished male, in no acute distress. The vital signs are documented above. CARDIAC: There is a regular rate and rhythm.  VASCULAR:  2+ palpable femoral pulses bilaterally Left PT 1+ palpable No lower extremity tissue loss Lower extremity venous stasis changes PULMONARY: There is good air exchange bilaterally without wheezing or rales. ABDOMEN: Soft and non-tender with normal pitched bowel sounds.  No pain with deep palpation. MUSCULOSKELETAL: There are no major deformities or cyanosis. NEUROLOGIC: No focal weakness or paresthesias are detected. SKIN: There are no ulcers or rashes noted.   DATA:   EVAR duplex in 2019 showed 4.7 cm aneurysm  EVAR duplex 09/12/19 showed largest aortic measurement being 5.5 cm from 5.4 cm 3 months prior.  EVAR duplex today shows no growth in the aneurysm and is stable at 5.5 cm to 5.5 cm and no endoleak  identified.  Assessment/Plan:  86 year old male here for ongoing follow-up of enlarging aneurysm sac.  We had previously discussed CTA last year to further evaluate his expanding aneurysm when I initially evaluated him, but he was hesitant given a very bad experience with a CT scan in the past while he was on Coumadin.   When I saw him in January at 66-month follow-up there was minimal change in the aneurysm from 5.4 to 5.5 cm and no endoleak identified.  We then agreed on 6 month follow-up.  On follow-up today at 6 months his aneurysm remains stable at 5.5 cm and no endoleak again is seen.  Discussed with him and his wife given his age of 35 and essentially stable sac size over the last 9 months with no endoleak identified on duplex, I think it is safe to continue observation for now.  Follow-up again in 6 months with EVAR duplex.  Discussed he call with questions or concerns.  Marty Heck, MD [facility] Office: [place]

## 2020-04-18 ENCOUNTER — Other Ambulatory Visit: Payer: Self-pay | Admitting: *Deleted

## 2020-04-18 DIAGNOSIS — Z8679 Personal history of other diseases of the circulatory system: Secondary | ICD-10-CM

## 2020-05-14 DIAGNOSIS — M5136 Other intervertebral disc degeneration, lumbar region: Secondary | ICD-10-CM | POA: Diagnosis not present

## 2020-05-16 DIAGNOSIS — D485 Neoplasm of uncertain behavior of skin: Secondary | ICD-10-CM | POA: Diagnosis not present

## 2020-05-16 DIAGNOSIS — C44622 Squamous cell carcinoma of skin of right upper limb, including shoulder: Secondary | ICD-10-CM | POA: Diagnosis not present

## 2020-05-16 DIAGNOSIS — L570 Actinic keratosis: Secondary | ICD-10-CM | POA: Diagnosis not present

## 2020-05-16 DIAGNOSIS — L720 Epidermal cyst: Secondary | ICD-10-CM | POA: Diagnosis not present

## 2020-05-16 DIAGNOSIS — L821 Other seborrheic keratosis: Secondary | ICD-10-CM | POA: Diagnosis not present

## 2020-05-16 DIAGNOSIS — Z85828 Personal history of other malignant neoplasm of skin: Secondary | ICD-10-CM | POA: Diagnosis not present

## 2020-05-20 DIAGNOSIS — H401132 Primary open-angle glaucoma, bilateral, moderate stage: Secondary | ICD-10-CM | POA: Diagnosis not present

## 2020-05-21 ENCOUNTER — Ambulatory Visit: Payer: Medicare Other | Attending: Internal Medicine

## 2020-05-21 DIAGNOSIS — Z23 Encounter for immunization: Secondary | ICD-10-CM

## 2020-05-21 NOTE — Progress Notes (Signed)
   Covid-19 Vaccination Clinic  Name:  ABDIEL BLACKERBY    [name]: [id number] DOB: [birth year]  05/21/2020  Mr. [place] was observed post Covid-19 immunization for 15 minutes without incident. He was provided with Vaccine Information Sheet and instruction to access the V-Safe system.   Mr. Orourke was instructed to call 911 with any severe reactions post vaccine: [name] Difficulty breathing  . Swelling of face and throat  . A fast heartbeat  . A bad rash all over body  . Dizziness and weakness

## 2020-05-29 ENCOUNTER — Ambulatory Visit (INDEPENDENT_AMBULATORY_CARE_PROVIDER_SITE_OTHER): Payer: Medicare Other

## 2020-05-29 DIAGNOSIS — I255 Ischemic cardiomyopathy: Secondary | ICD-10-CM | POA: Diagnosis not present

## 2020-05-31 LAB — CUP PACEART REMOTE DEVICE CHECK
Battery Impedance: 1280 Ohm
Battery Remaining Longevity: 58 mo
Battery Voltage: 2.76 V
Brady Statistic RV Percent Paced: 19 %
Date Time Interrogation Session: 20211013110230
Implantable Lead Implant Date: 20080818
Implantable Lead Implant Date: 20080818
Implantable Lead Location: 753859
Implantable Lead Location: 753860
Implantable Lead Model: 4469
Implantable Lead Model: 4470
Implantable Lead Serial Number: 499409
Implantable Lead Serial Number: 602985
Implantable Pulse Generator Implant Date: 20150320
Lead Channel Impedance Value: 487 Ohm
Lead Channel Impedance Value: 67 Ohm
Lead Channel Pacing Threshold Amplitude: 1.375 V
Lead Channel Pacing Threshold Pulse Width: 0.4 ms
Lead Channel Setting Pacing Amplitude: 2.75 V
Lead Channel Setting Pacing Pulse Width: 0.4 ms
Lead Channel Setting Sensing Sensitivity: 5.6 mV

## 2020-06-03 NOTE — Progress Notes (Signed)
Remote pacemaker transmission.   

## 2020-06-07 DIAGNOSIS — R3912 Poor urinary stream: Secondary | ICD-10-CM | POA: Diagnosis not present

## 2020-06-07 DIAGNOSIS — N401 Enlarged prostate with lower urinary tract symptoms: Secondary | ICD-10-CM | POA: Diagnosis not present

## 2020-06-11 ENCOUNTER — Other Ambulatory Visit: Payer: Self-pay

## 2020-06-11 ENCOUNTER — Encounter: Payer: Self-pay | Admitting: Podiatry

## 2020-06-11 ENCOUNTER — Ambulatory Visit (INDEPENDENT_AMBULATORY_CARE_PROVIDER_SITE_OTHER): Payer: Medicare Other | Admitting: Podiatry

## 2020-06-11 DIAGNOSIS — M79676 Pain in unspecified toe(s): Secondary | ICD-10-CM

## 2020-06-11 DIAGNOSIS — M5416 Radiculopathy, lumbar region: Secondary | ICD-10-CM | POA: Diagnosis not present

## 2020-06-11 DIAGNOSIS — Z23 Encounter for immunization: Secondary | ICD-10-CM | POA: Diagnosis not present

## 2020-06-11 DIAGNOSIS — D689 Coagulation defect, unspecified: Secondary | ICD-10-CM

## 2020-06-11 DIAGNOSIS — Q828 Other specified congenital malformations of skin: Secondary | ICD-10-CM

## 2020-06-11 DIAGNOSIS — B351 Tinea unguium: Secondary | ICD-10-CM | POA: Diagnosis not present

## 2020-06-11 NOTE — Progress Notes (Signed)
He presents today with his wife chief complaint of painful elongated toenails and callus plantar aspect of the forefoot right.  Objective: Vital signs are stable alert and x3 pulses remain palpable toenails are long thick yellow dystrophic-like mycotic sharply incurvated and painful.  Painful reactive hyper keratoma plantar aspect of the right foot no open lesions or wounds are noted.  Assessment: Pain in limb secondary to onychomycosis porokeratosis plantar aspect right foot.  Plan: Debridement of toenails 1 through 5 bilateral.  Debridement of reactive hyperkeratotic lesion follow-up with him in 3 months

## 2020-06-12 DIAGNOSIS — N401 Enlarged prostate with lower urinary tract symptoms: Secondary | ICD-10-CM | POA: Diagnosis not present

## 2020-06-12 DIAGNOSIS — R351 Nocturia: Secondary | ICD-10-CM | POA: Diagnosis not present

## 2020-06-27 ENCOUNTER — Other Ambulatory Visit: Payer: Self-pay | Admitting: Nurse Practitioner

## 2020-07-09 DIAGNOSIS — Z79899 Other long term (current) drug therapy: Secondary | ICD-10-CM | POA: Diagnosis not present

## 2020-07-09 DIAGNOSIS — G629 Polyneuropathy, unspecified: Secondary | ICD-10-CM | POA: Diagnosis not present

## 2020-07-18 DIAGNOSIS — Z961 Presence of intraocular lens: Secondary | ICD-10-CM | POA: Diagnosis not present

## 2020-07-18 DIAGNOSIS — H353133 Nonexudative age-related macular degeneration, bilateral, advanced atrophic without subfoveal involvement: Secondary | ICD-10-CM | POA: Diagnosis not present

## 2020-07-18 DIAGNOSIS — H43813 Vitreous degeneration, bilateral: Secondary | ICD-10-CM | POA: Diagnosis not present

## 2020-07-18 DIAGNOSIS — H401132 Primary open-angle glaucoma, bilateral, moderate stage: Secondary | ICD-10-CM | POA: Diagnosis not present

## 2020-08-12 ENCOUNTER — Telehealth: Payer: Self-pay | Admitting: *Deleted

## 2020-08-12 NOTE — Progress Notes (Signed)
Cardiology Office Note:    Date:  08/15/2020   ID:  Adam [name], DOB 1924-11-22, [name] [id number]  PCP:  Daisy Floro, MD  Cardiologist:  Lesleigh Noe, MD   Referring MD: Daisy Floro, MD   Chief Complaint  Patient presents with  . Coronary Artery Disease  . Atrial Fibrillation    History of Present Illness:    Adam [name] is a 86 y.o. male with a hx of ischemic cardiomyopathy,permanent pacemaker,chronic systolic heart failure, chronic atrial fibrillation, CAD with bypass graft failure (saphenous vein graft to the obtuse marginal), hypertension, chronic anticoagulation therapy, hypertension, and hyperlipidemia. EF is 35% per echo from 03/2016.  He has no cardiac complaints.  He denies angina, orthopnea, syncope, palpitations, and claudication.  He is concerned about a cath area skin injury that has not healed over the past 3 months.  It is not draining.  There is no fluctuance or warmth.  It is not tender.    Past Medical History:  Diagnosis Date  . AAA (abdominal aortic aneurysm) ([facility])   . Anginal pain ([facility])   . Arthritis   . CAD (coronary artery disease)    Adam [name] IS CARDIO  . CHF (congestive heart failure) ([facility])   . Chronic anticoagulation   . Diverticulosis   . GERD (gastroesophageal reflux disease)   . Hyperlipidemia   . Hypertension   . LBP (low back pain)   . Permanent atrial fibrillation ([facility])    decision made at [facility]  . Presence of permanent cardiac pacemaker   . S/P CABG (coronary artery bypass graft)   . Shortness of breath dyspnea   . Sleep apnea     Past Surgical History:  Procedure Laterality Date  . CARDIOVERSION  06/30/2012   Procedure: CARDIOVERSION;  Surgeon: Lesleigh Noe, MD;  Location: [facility] ENDOSCOPY;  Service: Cardiovascular;  Laterality: N/A;  . CARDIOVERSION N/A 11/23/2013   Procedure: CARDIOVERSION;  Surgeon: Lesleigh Noe, MD;  Location: [facility] ENDOSCOPY;  Service: Cardiovascular;  Laterality: N/A;  .  CATARACT EXTRACTION W/ INTRAOCULAR LENS  IMPLANT, BILATERAL    . CORONARY ARTERY BYPASS GRAFT  Feb 1989   "CABG X6"  . ENDOVASCULAR STENT INSERTION  07/28/2011   Procedure: ENDOVASCULAR STENT GRAFT INSERTION;  Surgeon: Nilda Simmer, MD;  Location: [facility] OR;  Service: Vascular;  Laterality: N/A;  Pt. on Coumadin/ instructed to hold 5 days prior to procedure  . EYE SURGERY    . INGUINAL HERNIA REPAIR Right 01/04/2015  . INGUINAL HERNIA REPAIR Right 01/04/2015   Procedure:  OPEN REPAIR RIGHT INGUNIAL HERNIA;  Surgeon: Claud Kelp, MD;  Location: [facility] OR;  Service: General;  Laterality: Right;  . INSERT / REPLACE / REMOVE PACEMAKER  July 2008  . INSERTION OF MESH Right 01/04/2015   Procedure: INSERTION OF MESH;  Surgeon: Claud Kelp, MD;  Location: [facility] OR;  Service: General;  Laterality: Right;  . JOINT REPLACEMENT  1995 & 1996   Bilateral total knee replacements  . LEFT HEART CATHETERIZATION WITH CORONARY ANGIOGRAM N/A 03/28/2014   Procedure: LEFT HEART CATHETERIZATION WITH CORONARY ANGIOGRAM;  Surgeon: Lesleigh Noe, MD;  Location[facility] CATH LAB;  Service: Cardiovascular;  Laterality: N/A;  . PERMANENT PACEMAKER GENERATOR CHANGE N/A 11/03/2013   Procedure: PERMANENT PACEMAKER GENERATOR CHANGE;  Surgeon: Duke Salvia, MD;  Location: [facility] CATH LAB;  Service: Cardiovascular;  Laterality: N/A;  . TONSILLECTOMY      Current Medications: Current Meds  Medication  Sig  . acetaminophen (TYLENOL) 650 MG CR tablet Take 650 mg by mouth every 8 (eight) hours as needed for pain.  [name] apixaban (ELIQUIS) 5 MG TABS tablet Take 5 mg by mouth 2 (two) times daily.  [name] atorvastatin (LIPITOR) 10 MG tablet Take 1 tablet (10 mg total) by mouth every evening.  . Cholecalciferol (VITAMIN D) 2000 UNITS CAPS Take 2,000 Units by mouth daily.  . dorzolamide (TRUSOPT) 2 % ophthalmic solution Place 1 drop into the right eye 2 times daily.  . finasteride (PROSCAR) 5 MG tablet Take 5 mg by mouth daily.  . furosemide  (LASIX) 80 MG tablet Take by mouth.  . latanoprost (XALATAN) 0.005 % ophthalmic solution Place 1 drop into both eyes at bedtime.  . Multiple Vitamins-Minerals (PRESERVISION AREDS 2+MULTI VIT PO) Take 1 capsule by mouth 2 (two) times daily.   . nitroGLYCERIN (NITROSTAT) 0.4 MG SL tablet PLACE 1 TABLET UNDER THE TONGUE EVERY 5 MINUTES AS NEEDED FOR CHEST PAIN  . silodosin (RAPAFLO) 8 MG CAPS capsule Take 8 mg by mouth at bedtime.      Allergies:   Amlodipine, Lisinopril, Morphine, and Amiodarone   Social History   Socioeconomic History  . Marital status: Married    Spouse name: Not on file  . Number of children: Not on file  . Years of education: Not on file  . Highest education level: Not on file  Occupational History  . Not on file  Tobacco Use  . Smoking status: Former Smoker    Years: 3.00    Types: Cigarettes    Quit date: 08/17/1948    Years since quitting: 72.0  . Smokeless tobacco: Never Used  Vaping Use  . Vaping Use: Never used  Substance and Sexual Activity  . Alcohol use: No  . Drug use: No  . Sexual activity: Not on file  Other Topics Concern  . Not on file  Social History Narrative  . Not on file   Social Determinants of Health   Financial Resource Strain: Not on file  Food Insecurity: Not on file  Transportation Needs: Not on file  Physical Activity: Not on file  Stress: Not on file  Social Connections: Not on file     Family History: The patient's family history includes Cancer in his sister; Heart attack in his father; Heart disease in his father and sister; Heart failure in his mother; Other in his sister; Stroke in his mother; Varicose Veins in his mother.  ROS:   Please see the history of present illness.    He is concerned that his calf wound may be infected.  He denies chills, fever, and drainage.  He also has a left inguinal hernia and is awaiting a general surgical consultation.  All other systems reviewed and are negative.  EKGs/Labs/Other  Studies Reviewed:    The following studies were reviewed today: No new cardiac related data  EKG:  EKG not repeated  Recent Labs: 08/31/2019: BUN 22; Creatinine, Ser 1.00; Hemoglobin 12.7; NT-Pro BNP 900; Platelets 150; Potassium 3.9; Sodium 141  Recent Lipid Panel    Component Value Date/Time   CHOL 138 01/03/2018 1146   TRIG 86 01/03/2018 1146   HDL 53 01/03/2018 1146   CHOLHDL 2.6 01/03/2018 1146   LDLCALC 68 01/03/2018 1146    Physical Exam:    VS:  BP (!) 142/64   Pulse (!) 55   Ht 5\' 8"  (1.727 m)   Wt 148 lb (67.1 kg)   SpO2 98%  BMI 22.50 kg/m     Wt Readings from Last 3 Encounters:  08/15/20 148 lb (67.1 kg)  08/13/20 148 lb 1.6 oz (67.2 kg)  04/16/20 150 lb (68 kg)     GEN: Elderly and appears to be doing well.. No acute distress HEENT: Normal NECK: No JVD. LYMPHATICS: No lymphadenopathy CARDIAC: 2 of 6 left lower sternal and apical systolic murmur.  Irregular rate and rhythm without gallop, or edema. VASCULAR:  Normal Pulses. No bruits. RESPIRATORY:  Clear to auscultation without rales, wheezing or rhonchi  ABDOMEN: Soft, non-tender, non-distended, No pulsatile mass, MUSCULOSKELETAL: There is a 2 cm erythematous lesion on the posterior aspect of the right calf.  There is an eschar.  There is no fluctuance, drainage, tenderness, or warmth. SKIN: Warm and dry NEUROLOGIC:  Alert and oriented x 3 PSYCHIATRIC:  Normal affect   ASSESSMENT:    1. Permanent atrial fibrillation ([place])   2. Chronic systolic heart failure ([place])   3. PACEMAKER, PERMANENT-[employer]   4. Sinus brady-tachy syndrome ([place])   5. S/P AAA (abdominal aortic aneurysm) repair   6. History of repair of aneurysm of abdominal aorta using endovascular stent graft   7. Encounter for therapeutic drug monitoring   8. Other hyperlipidemia   9. Long term current use of anticoagulant therapy   10. Educated about COVID-19 virus infection   27. Wound of right lower extremity, initial encounter     PLAN:    In order of problems listed above:  1. Stable without rapid heart rate.  Continue same therapy which includes pacemaker therapy and anticoagulation. 2. Continue Lasix to prevent volume overload. 3. Follow-up in pacemaker clinic. 4. Treated with pacemaker. 5. Not discussed 6. Not discussed 7. Eliquis therapy without bleeding.  Recent hemoglobin and creatinine suggests no evidence of bleeding or requirement for dose change. 8. Continue Lipitor 10 mg daily 9. See above 10. Vaccinated and boosted 11. We will refer the patient to the wound clinic.   Medication Adjustments/Labs and Tests Ordered: Current medicines are reviewed at length with the patient today.  Concerns regarding medicines are outlined above.  Orders Placed This Encounter  Procedures  . Ambulatory referral to Wound Clinic   No orders of the defined types were placed in this encounter.   Patient Instructions  Medication Instructions:  Your physician recommends that you continue on your current medications as directed. Please refer to the Current Medication list given to you today.  *If you need a refill on your cardiac medications before your next appointment, please call your pharmacy*   Lab Work: None If you have labs (blood work) drawn today and your tests are completely normal, you will receive your results only by: [name] MyChart Message (if you have MyChart) OR . A paper copy in the mail If you have any lab test that is abnormal or we need to change your treatment, we will call you to review the results.   Testing/Procedures: None   Follow-Up: At [facility], you and your health needs are our priority.  As part of our continuing mission to provide you with exceptional heart care, we have created designated Provider Care Teams.  These Care Teams include your primary Cardiologist (physician) and Advanced Practice Providers (APPs -  Physician Assistants and Nurse Practitioners) who all work  together to provide you with the care you need, when you need it.  We recommend signing up for the patient portal called "MyChart".  Sign up information is provided on this After Visit  Summary.  MyChart is used to connect with patients for Virtual Visits (Telemedicine).  Patients are able to view lab/test results, encounter notes, upcoming appointments, etc.  Non-urgent messages can be sent to your provider as well.   To learn more about what you can do with MyChart, go to [email or web address].    Your next appointment:   9-12 month(s)  The format for your next appointment:   In Person  Provider:   You may see Lesleigh Noe, MD or one of the following Advanced Practice Providers on your designated Care Team:    Norma Fredrickson, NP  Nada Boozer, NP  Georgie Chard, NP    Other Instructions  You have been referred to the Wound Clinc by [facility].  That office will be in contact to get you scheduled.      Signed, Lesleigh Noe, MD  08/15/2020 2:04 PM    [facility]

## 2020-08-12 NOTE — Telephone Encounter (Signed)
Patient's wife called she states patient has a knot just above his penis in his pelvic area she wanted to know if this is something we should see patient for. We are seeing patient for previous AAA .Patient denies any abdominal or back pain. Advised patient's wife to call PCP for evaluation and to call us back if PCP thinks this is related to Vascular. Patient's wife verbalized understanding. Advise if patient develops any abdominal pain or sudden onset of back pain go to ER.

## 2020-08-13 ENCOUNTER — Ambulatory Visit (INDEPENDENT_AMBULATORY_CARE_PROVIDER_SITE_OTHER): Payer: Medicare Other | Admitting: Physician Assistant

## 2020-08-13 ENCOUNTER — Other Ambulatory Visit: Payer: Self-pay

## 2020-08-13 ENCOUNTER — Ambulatory Visit: Payer: Medicare Other | Admitting: Nurse Practitioner

## 2020-08-13 ENCOUNTER — Ambulatory Visit (HOSPITAL_COMMUNITY)
Admission: RE | Admit: 2020-08-13 | Discharge: 2020-08-13 | Disposition: A | Discharge status: Home / Self Care | Payer: Medicare Other | Source: Ambulatory Visit | Attending: Physician Assistant | Admitting: Physician Assistant

## 2020-08-13 VITALS — BP 131/51 | HR 61 | Temp 98.5°F | Resp 20 | Ht 68.0 in | Wt 148.1 lb

## 2020-08-13 DIAGNOSIS — K4090 Unilateral inguinal hernia, without obstruction or gangrene, not specified as recurrent: Secondary | ICD-10-CM

## 2020-08-13 DIAGNOSIS — Z8679 Personal history of other diseases of the circulatory system: Secondary | ICD-10-CM

## 2020-08-13 DIAGNOSIS — I714 Abdominal aortic aneurysm, without rupture: Secondary | ICD-10-CM | POA: Diagnosis not present

## 2020-08-13 DIAGNOSIS — Z9889 Other specified postprocedural states: Secondary | ICD-10-CM | POA: Diagnosis not present

## 2020-08-13 NOTE — Progress Notes (Signed)
Established EVAR   History of Present Illness   Adam [name] is a 86 y.o. (Sep 10, 1924) male who presents for routine follow up s/p EVAR in 2012 by Dr. Bridgett Larsson.  In the past couple years he is being followed closely due to enlarging aneurysmal sac.  At the last office visit with Dr. Carlis Abbott on 8/31/12021 AAA sac measured 5.5 cm.  Patient denies any new or changing abdominal or back pain.  Patient returns to clinic earlier than the 17-month interval due to a "lump" he noticed in his left groin over the past 5 days and is worried it is related to his aneurysmal repair.  This mass does not bother him however he is just concerned about its presence.  Past medical history also significant for hypertension, hyperlipidemia, CHF, atrial fibrillation on Eliquis, and CAD with history of CABG. surgical history also significant for open right inguinal hernia repair by Dr. Dalbert Batman on 01/04/2015.   Current Outpatient Medications  Medication Sig Dispense Refill  . acetaminophen (TYLENOL) 650 MG CR tablet Take 650 mg by mouth every 8 (eight) hours as needed for pain.    [name] apixaban (ELIQUIS) 5 MG TABS tablet Take 5 mg by mouth 2 (two) times daily.    [name] atorvastatin (LIPITOR) 10 MG tablet Take 1 tablet (10 mg total) by mouth every evening. 90 tablet 2  . Cholecalciferol (VITAMIN D) 2000 UNITS CAPS Take 2,000 Units by mouth daily.    . dorzolamide (TRUSOPT) 2 % ophthalmic solution Place 1 drop into the right eye 2 times daily.    . finasteride (PROSCAR) 5 MG tablet Take 5 mg by mouth daily.    [name] ipratropium (ATROVENT) 0.06 % nasal spray     . latanoprost (XALATAN) 0.005 % ophthalmic solution Place 1 drop into both eyes at bedtime.    . Multiple Vitamins-Minerals (PRESERVISION AREDS 2+MULTI VIT PO) Take 1 capsule by mouth 2 (two) times daily.     . nitroGLYCERIN (NITROSTAT) 0.4 MG SL tablet PLACE 1 TABLET UNDER THE TONGUE EVERY 5 MINUTES AS NEEDED FOR CHEST PAIN 25 tablet 5  . Omega-3 Fatty Acids (FISH OIL) 1000 MG  CPDR Take 1 capsule by mouth daily.    . silodosin (RAPAFLO) 8 MG CAPS capsule Take 8 mg by mouth at bedtime.     . furosemide (LASIX) 80 MG tablet Take by mouth.     No current facility-administered medications for this visit.    REVIEW OF SYSTEMS (negative unless checked):   Cardiac:  []  Chest pain or chest pressure? []  Shortness of breath upon activity? []  Shortness of breath when lying flat? []  Irregular heart rhythm?  Vascular:  []  Pain in calf, thigh, or hip brought on by walking? []  Pain in feet at night that wakes you up from your sleep? []  Blood clot in your veins? []  Leg swelling?  Pulmonary:  []  Oxygen at home? []  Productive cough? []  Wheezing?  Neurologic:  []  Sudden weakness in arms or legs? []  Sudden numbness in arms or legs? []  Sudden onset of difficult speaking or slurred speech? []  Temporary loss of vision in one eye? []  Problems with dizziness?  Gastrointestinal:  []  Blood in stool? []  Vomited blood?  Genitourinary:  []  Burning when urinating? []  Blood in urine?  Psychiatric:  []  Major depression  Hematologic:  []  Bleeding problems? []  Problems with blood clotting?  Dermatologic:  []  Rashes or ulcers?  Constitutional:  []  Fever or chills?  Ear/Nose/Throat:  []  Change in hearing? []   Nose bleeds? []  Sore throat?  Musculoskeletal:  []  Back pain? []  Joint pain? []  Muscle pain?   Physical Examination   Vitals:   08/13/20 0955  BP: (!) 131/51  Pulse: 61  Resp: 20  Temp: 98.5 F (36.9 C)  TempSrc: Temporal  SpO2: 99%  Weight: 148 lb 1.6 oz (67.2 kg)  Height: 5\' 8"  (1.727 m)   Body mass index is 22.52 kg/m.  General:  WDWN in NAD; vital signs documented above Gait: Not observed HENT: WNL, normocephalic Pulmonary: normal non-labored breathing , without Rales, rhonchi,  wheezing Cardiac: regular HR Abdomen: soft, NT, no masses Skin: without rashes Vascular Exam/Pulses: Feet symmetrically warm to touch with good capillary  refill Extremities: without ischemic changes, without Gangrene , without cellulitis; without open wounds; left groin mass reducible when patient is supine and knees bent; unable to palpate size of defect Musculoskeletal: no muscle wasting or atrophy  Neurologic: A&O X 3;  No focal weakness or paresthesias are detected Psychiatric:  The pt has Normal affect.  Non-Invasive Vascular Imaging   EVAR Duplex   AAA sac size: 5.46 cm in maximal diameter  no endoleak detected   Medical Decision Making   Adam [name] is a 86 y.o. male who presents for surveillance of EVAR   Based on duplex, AAA sac size is stable measuring 5.46 cm in largest diameter  Dr. is already discussed conservative management in detail with the patient and his wife as long as aneurysmal sac remains stable  Based on physical exam, patient has a reducible left inguinal hernia.  He had a open right inguinal hernia repair by Dr. 08/15/20 on 01/04/2015.  Patient will be referred back to Dr. Vinson Moselle office for evaluation of left inguinal hernia  Patient will follow up with our office in 6 months with a repeat EVAR duplex   83 PA-C [facility] Office: [phone]  Clinic MD: 01/06/2015

## 2020-08-14 ENCOUNTER — Other Ambulatory Visit: Payer: Self-pay

## 2020-08-14 DIAGNOSIS — I714 Abdominal aortic aneurysm, without rupture, unspecified: Secondary | ICD-10-CM

## 2020-08-15 ENCOUNTER — Ambulatory Visit (INDEPENDENT_AMBULATORY_CARE_PROVIDER_SITE_OTHER): Payer: Medicare Other | Admitting: Interventional Cardiology

## 2020-08-15 ENCOUNTER — Encounter: Payer: Self-pay | Admitting: Interventional Cardiology

## 2020-08-15 ENCOUNTER — Other Ambulatory Visit: Payer: Self-pay

## 2020-08-15 VITALS — BP 142/64 | HR 55 | Ht 68.0 in | Wt 148.0 lb

## 2020-08-15 DIAGNOSIS — I5022 Chronic systolic (congestive) heart failure: Secondary | ICD-10-CM

## 2020-08-15 DIAGNOSIS — Z8679 Personal history of other diseases of the circulatory system: Secondary | ICD-10-CM

## 2020-08-15 DIAGNOSIS — Z9889 Other specified postprocedural states: Secondary | ICD-10-CM

## 2020-08-15 DIAGNOSIS — Z95828 Presence of other vascular implants and grafts: Secondary | ICD-10-CM

## 2020-08-15 DIAGNOSIS — I495 Sick sinus syndrome: Secondary | ICD-10-CM

## 2020-08-15 DIAGNOSIS — S81801A Unspecified open wound, right lower leg, initial encounter: Secondary | ICD-10-CM

## 2020-08-15 DIAGNOSIS — Z7189 Other specified counseling: Secondary | ICD-10-CM

## 2020-08-15 DIAGNOSIS — Z7901 Long term (current) use of anticoagulants: Secondary | ICD-10-CM | POA: Diagnosis not present

## 2020-08-15 DIAGNOSIS — Z950 Presence of cardiac pacemaker: Secondary | ICD-10-CM | POA: Diagnosis not present

## 2020-08-15 DIAGNOSIS — Z5181 Encounter for therapeutic drug level monitoring: Secondary | ICD-10-CM

## 2020-08-15 DIAGNOSIS — E7849 Other hyperlipidemia: Secondary | ICD-10-CM

## 2020-08-15 DIAGNOSIS — I4821 Permanent atrial fibrillation: Secondary | ICD-10-CM | POA: Diagnosis not present

## 2020-08-15 DIAGNOSIS — I255 Ischemic cardiomyopathy: Secondary | ICD-10-CM

## 2020-08-15 NOTE — Patient Instructions (Signed)
Medication Instructions:  Your physician recommends that you continue on your current medications as directed. Please refer to the Current Medication list given to you today.  *If you need a refill on your cardiac medications before your next appointment, please call your pharmacy*   Lab Work: None If you have labs (blood work) drawn today and your tests are completely normal, you will receive your results only by: [name] MyChart Message (if you have MyChart) OR . A paper copy in the mail If you have any lab test that is abnormal or we need to change your treatment, we will call you to review the results.   Testing/Procedures: None   Follow-Up: At [facility], you and your health needs are our priority.  As part of our continuing mission to provide you with exceptional heart care, we have created designated Provider Care Teams.  These Care Teams include your primary Cardiologist (physician) and Advanced Practice Providers (APPs -  Physician Assistants and Nurse Practitioners) who all work together to provide you with the care you need, when you need it.  We recommend signing up for the patient portal called "MyChart".  Sign up information is provided on this After Visit Summary.  MyChart is used to connect with patients for Virtual Visits (Telemedicine).  Patients are able to view lab/test results, encounter notes, upcoming appointments, etc.  Non-urgent messages can be sent to your provider as well.   To learn more about what you can do with MyChart, go to [email or web address].    Your next appointment:   9-12 month(s)  The format for your next appointment:   In Person  Provider:   You may see Lesleigh Noe, MD or one of the following Advanced Practice Providers on your designated Care Team:    Norma Fredrickson, NP  Nada Boozer, NP  Georgie Chard, NP    Other Instructions  You have been referred to the [facility] by [facility].  That office will be in contact  to get you scheduled.

## 2020-08-20 ENCOUNTER — Ambulatory Visit (INDEPENDENT_AMBULATORY_CARE_PROVIDER_SITE_OTHER): Payer: Medicare Other | Admitting: Neurology

## 2020-08-20 ENCOUNTER — Encounter: Payer: Self-pay | Admitting: Neurology

## 2020-08-20 VITALS — BP 138/64 | HR 52 | Ht 68.0 in | Wt 150.0 lb

## 2020-08-20 DIAGNOSIS — M5442 Lumbago with sciatica, left side: Secondary | ICD-10-CM

## 2020-08-20 DIAGNOSIS — L819 Disorder of pigmentation, unspecified: Secondary | ICD-10-CM

## 2020-08-20 DIAGNOSIS — M5441 Lumbago with sciatica, right side: Secondary | ICD-10-CM | POA: Diagnosis not present

## 2020-08-20 DIAGNOSIS — G8929 Other chronic pain: Secondary | ICD-10-CM | POA: Diagnosis not present

## 2020-08-20 DIAGNOSIS — R609 Edema, unspecified: Secondary | ICD-10-CM | POA: Diagnosis not present

## 2020-08-20 DIAGNOSIS — G609 Hereditary and idiopathic neuropathy, unspecified: Secondary | ICD-10-CM | POA: Diagnosis not present

## 2020-08-20 NOTE — Patient Instructions (Signed)
You may have a condition called peripheral neuropathy, i. e. nerve damage. Unfortunately, as I mentioned, there is no specific treatment for most neuropathies. The most common cause for neuropathy is diabetes in this [place], in which case, tight glucose control is key.  Some studies suggest that obesity and prediabetes can also cause nerve damage even in the absence of a formal diagnosis of diabetes.   It is possible that your foot pain is from more than 1 issue including low back pain, arthritis, and circulation problems in the legs.  Other causes include thyroid disease, and some vitamin deficiencies. Certain medications such as chemotherapy agents and other chemicals or toxins including alcohol can cause neuropathy. There are some genetic conditions or hereditary neuropathies. Typically patients will report a family history of neuropathy in those conditions. There are cases associated with cancers and autoimmune conditions. Most neuropathies are progressive unless a root cause can be found and treated, which is rare, as I explained. For most neuropathies there is no actual cure or reversing of symptoms. Painful neuropathy can be difficult to treat symptomatically, but there are some medications available to ease the symptoms. Electrophysiologic testing with nerve conduction velocity studies and EMG (muscle testing) do not always pick up neuropathies that affect the smallest fibers. Other common tests include different type of blood work, and rarely, spinal fluid testing, and sometimes we resort to asking for a nerve and muscle biopsy.  We can also consider specialist input from a neuromuscular specialist, sometimes we make referrals to [facility] or [facility] or [facility].  For now, as discussed, we will proceed with further work-up from my end of things: We will check blood work today schedule an EMG and nerve conduction velocity test, which is an electrical nerve and muscle test. We will call you  with the results and plan a follow up after testing.

## 2020-08-20 NOTE — Progress Notes (Signed)
Subjective:    Patient ID: Adam [name] is a 87 y.o. male.  HPI     Star Age, MD, PhD [facility] [address] [place], [place] [address]  Dear Dr. Harrington Challenger,   I saw your patient, Adam [name], upon your kind request, in my neurologic clinic today for initial consultation of his neuropathy. The patient is accompanied by his wife today.  As you know, Adam [name] is a 87 year old right-handed gentleman with an underlying medical complex history of coronary artery disease with status post CABG, sleep apnea, atrial fibrillation with status post pacemaker placement, and status post cardioversion before, hypothyroidism, hearing loss, hypertension, hyperlipidemia, diverticulosis, low back pain, with status post injections, congestive heart failure and ischemic cardiomyopathy, arthritis with status post bilateral knee replacements, and AAA, status post repair in 2012, who reports an approximately 1 to 2-year history of pain in both feet as well as intermittent numbness, symptoms worse at night and with walking or exercise.  Sometimes the pain is in his calves.  Sometimes it feels like the pain radiates down the lateral thighs.  He was recently diagnosed with a hernia.  He has met with a general surgeon for inguinal hernia.  He has several specialists including cardiology, podiatry, sees Dr. Nelva Bush for his back pain, and vascular surgery for recheck on his AAA.  I reviewed your office records including your office note from 07/09/2020.  He had blood work at the time including vitamin B12 check.  We were able to request test results from your office, level was 525. He was seen in your office on 03/06/2020 and had blood work including uric acid, lipid panel, CBC with differential, CMP, TSH.  I was able to review those test results.  TSH was 2.78, glucose 104, BUN 23, creatinine 0.95, uric acid mildly elevated at 8.6, RBC mildly reduced at 3.79, hematocrit mildly low  at 37.9, MCV elevated at 99.8, MCH elevated at 34.6. He has never been labeled diabetic.  He has no obvious family history of neuropathy.  He has not fallen within the last year but has tripped and fallen before.  He is on Eliquis.  He reports that his balance is not very good. He has lower extremity swelling which is chronic.  His Past Medical History Is Significant For: Past Medical History:  Diagnosis Date  . AAA (abdominal aortic aneurysm) ([place])   . Anginal pain ([place])   . Arthritis   . CAD (coronary artery disease)    Adam [name] IS CARDIO  . CHF (congestive heart failure) ([place])   . Chronic anticoagulation   . Diverticulosis   . GERD (gastroesophageal reflux disease)   . Hyperlipidemia   . Hypertension   . LBP (low back pain)   . Permanent atrial fibrillation ([place])    decision made at [facility]  . Presence of permanent cardiac pacemaker   . S/P CABG (coronary artery bypass graft)   . Shortness of breath dyspnea   . Sleep apnea     His Past Surgical History Is Significant For: Past Surgical History:  Procedure Laterality Date  . CARDIOVERSION  06/30/2012   Procedure: CARDIOVERSION;  Surgeon: Sinclair Grooms, MD;  Location: [facility] ENDOSCOPY;  Service: Cardiovascular;  Laterality: N/A;  . CARDIOVERSION N/A 11/23/2013   Procedure: CARDIOVERSION;  Surgeon: Sinclair Grooms, MD;  Location: [place];  Service: Cardiovascular;  Laterality: N/A;  . CATARACT EXTRACTION W/ INTRAOCULAR LENS  IMPLANT, BILATERAL    .  CORONARY ARTERY BYPASS GRAFT  Feb 1989   "CABG X6"  . ENDOVASCULAR STENT INSERTION  07/28/2011   Procedure: ENDOVASCULAR STENT GRAFT INSERTION;  Surgeon: Hinda Lenis, MD;  Location: [place];  Service: Vascular;  Laterality: N/A;  Pt. on Coumadin/ instructed to hold 5 days prior to procedure  . EYE SURGERY    . INGUINAL HERNIA REPAIR Right 01/04/2015  . INGUINAL HERNIA REPAIR Right 01/04/2015   Procedure:  OPEN REPAIR RIGHT INGUNIAL HERNIA;  Surgeon: Fanny Skates, MD;   Location: [place];  Service: General;  Laterality: Right;  . INSERT / REPLACE / REMOVE PACEMAKER  July 2008  . INSERTION OF MESH Right 01/04/2015   Procedure: INSERTION OF MESH;  Surgeon: Fanny Skates, MD;  Location: [place];  Service: General;  Laterality: Right;  . JOINT REPLACEMENT  1995 & 1996   Bilateral total knee replacements  . LEFT HEART CATHETERIZATION WITH CORONARY ANGIOGRAM N/A 03/28/2014   Procedure: LEFT HEART CATHETERIZATION WITH CORONARY ANGIOGRAM;  Surgeon: Sinclair Grooms, MD;  Location: [facility] CATH LAB;  Service: Cardiovascular;  Laterality: N/A;  . PERMANENT PACEMAKER GENERATOR CHANGE N/A 11/03/2013   Procedure: PERMANENT PACEMAKER GENERATOR CHANGE;  Surgeon: [name], MD;  Location: [facility] CATH LAB;  Service: Cardiovascular;  Laterality: N/A;  . TONSILLECTOMY      His Family History Is Significant For: Family History  Problem Relation Age of Onset  . Stroke Mother   . Heart failure Mother   . Varicose Veins Mother   . Heart attack Father   . Heart disease Father        Before age 84    Heart Attack age 29 and 47  . Cancer Sister        breast  . Heart disease Sister        After age 47  . Other Sister        DJD    His Social History Is Significant For: Social History   Socioeconomic History  . Marital status: Married    Spouse name: Not on file  . Number of children: Not on file  . Years of education: Not on file  . Highest education level: Not on file  Occupational History  . Not on file  Tobacco Use  . Smoking status: Former Smoker    Years: 3.00    Types: Cigarettes    Quit date: 08/17/1948    Years since quitting: 72.0  . Smokeless tobacco: Never Used  Vaping Use  . Vaping Use: Never used  Substance and Sexual Activity  . Alcohol use: No  . Drug use: No  . Sexual activity: Not on file  Other Topics Concern  . Not on file  Social History Narrative  . Not on file   Social Determinants of Health   Financial Resource Strain: Not on file   Food Insecurity: Not on file  Transportation Needs: Not on file  Physical Activity: Not on file  Stress: Not on file  Social Connections: Not on file    His Allergies Are:  Allergies  Allergen Reactions  . Amlodipine Other (See Comments)  . Lisinopril     Elevated potassium  . Morphine Nausea Only  . Amiodarone Anxiety and Rash  :   His Current Medications Are:  Outpatient Encounter Medications as of 08/20/2020  Medication Sig  . acetaminophen (TYLENOL) 650 MG CR tablet Take 650 mg by mouth every 8 (eight) hours as needed for pain.  [name] apixaban (ELIQUIS) 5  MG TABS tablet Take 5 mg by mouth 2 (two) times daily.  [name] atorvastatin (LIPITOR) 10 MG tablet Take 1 tablet (10 mg total) by mouth every evening.  . Cholecalciferol (VITAMIN D) 2000 UNITS CAPS Take 2,000 Units by mouth daily.  . dorzolamide (TRUSOPT) 2 % ophthalmic solution Place 1 drop into the right eye 2 times daily.  . finasteride (PROSCAR) 5 MG tablet Take 5 mg by mouth daily.  . furosemide (LASIX) 80 MG tablet Take by mouth.  . latanoprost (XALATAN) 0.005 % ophthalmic solution Place 1 drop into both eyes at bedtime.  . Multiple Vitamins-Minerals (PRESERVISION AREDS 2+MULTI VIT PO) Take 1 capsule by mouth 2 (two) times daily.   . nitroGLYCERIN (NITROSTAT) 0.4 MG SL tablet PLACE 1 TABLET UNDER THE TONGUE EVERY 5 MINUTES AS NEEDED FOR CHEST PAIN  . silodosin (RAPAFLO) 8 MG CAPS capsule Take 8 mg by mouth at bedtime.   [name] VITAMIN D PO Take by mouth.   No facility-administered encounter medications on file as of 08/20/2020.  :   Review of Systems:  Out of a complete 14 point review of systems, all are reviewed and negative with the exception of these symptoms as listed below:  Review of Systems  Neurological:       Pt presents today to discuss treatment options for bilateral lower leg/feet neuropathy. His symptoms have been bothersome for about one year.    Objective:  Neurological Exam  Physical Exam  Physical  Examination:   Vitals:   08/20/20 0755  BP: 138/64  Pulse: (!) 52    General Examination: The patient is a very pleasant 87 y.o. male in no acute distress. He appears well-developed and well-nourished and well groomed.   HEENT: Normocephalic, atraumatic, pupils are equal, round and reactive to light, he is status post cataract repairs. He is hard of hearing, he has bilateral hearing aids. Face is symmetric with normal facial animation and normal facial sensation. Speech is clear without dysarthria, hypophonia or voice tremor. Neck is supple with full range of motion, no carotid bruits. Airway examination reveals mild mouth dryness, tongue protrudes centrally and palate elevates symmetrically.  Chest: Clear to auscultation without wheezing, rhonchi or crackles noted.  Heart: S1+S2+0, regular and normal without murmurs, rubs or gallops noted.   Abdomen: Soft, non-tender and non-distended with normal bowel sounds appreciated on auscultation.  Extremities: There is 1 to 2+ pitting edema in the distal lower extremities bilaterally. Pedal pulses are difficult to palpate. His toes are colder, shin and calf areas are warmer. He has chronic appearing discoloration in the distal lower extremities bilaterally, patchy bruising noted, spider veins, no major varicose veins.   Skin: Warm and dry with chronic appearing changes with discoloration in the distal lower extremities bilaterally but also small and chronic appearing bruising in the upper and lower extremities. Of note, he is on Eliquis.   Musculoskeletal: exam reveals arthritic changes in both hands. He also reports some back pain, particularly on the right side. Upon standing, he has a slight forward tilt of the upper body and left tilt, possibly mild scoliosis, right shoulder is higher than left. Unremarkable scars both knees from replacement surgeries.  Neurologically:  Mental status: The patient is awake, alert and oriented in all 4 spheres.  His immediate and remote memory, attention, language skills and fund of knowledge are appropriate. There is no evidence of aphasia, agnosia, apraxia or anomia. Speech is clear with normal prosody and enunciation. Thought process is linear. Mood is normal  and affect is normal.  Cranial nerves II - XII are as described above under HEENT exam. In addition: shoulder shrug is normal with equal shoulder height noted. Motor exam: Normal bulk, strength and tone is noted. There is no drift, tremor or rebound. Romberg is not tested secondary to safety concerns, tandem walk not tested for the same reason. Reflexes are 1+ in the upper extremities and absent in the lower extremities. Babinski: Toes are flexor bilaterally. Fine motor skills and coordination: intact with normal finger taps, normal hand movements, normal rapid alternating patting, normal foot taps and normal foot agility.  Cerebellar testing: No dysmetria or intention tremor on finger to nose testing. Heel to shin is mildly difficult and results and mild limitation on the right side with back pain. Otherwise no dysmetria noted. Sensory exam: intact to light touch, pinprick, vibration, and temperature sense in the upper extremities and proximal lower extremities but decreased sensation to temperature and vibration in the distal lower extremities to below knee areas bilaterally. Pinprick fairly well-preserved in the lower extremities, but decreased in the feet.  Gait, station and balance: He stands with mild difficulty, he pushes himself up. Posture as described above. He walks without a walking aid, he walks wide-based and with mild insecurity. No shuffling noted, he has preserved arm swing.  Assessment and Plan:   In summary, Adam [name] is a very pleasant 87 y.o.-year old male with an underlying medical complex history of coronary artery disease with status post CABG, sleep apnea, atrial fibrillation with status post pacemaker placement, and status  post cardioversion before, hypothyroidism, hearing loss, hypertension, hyperlipidemia, diverticulosis, low back pain, with status post injections, congestive heart failure and ischemic cardiomyopathy, arthritis with status post bilateral knee replacements, and AAA, status post repair in 2012, who presents for evaluation of his leg and foot pain as well as numbness. Symptoms have been ongoing for 1 to 2 years. His leg pain the result of more than 1 factor, including arthritis of the lower extremity joints, he has some residual pain in both knees, he also has chronic low back pain with some radiation reported, peripheral vascular disease with possible claudication may be another contributor, he has been followed by vascular surgery. He certainly has chronic appearing changes and swelling in his lower extremities. In addition, exam is supportive for possible underlying neuropathy. I suggested we proceed with further work-up in the form of blood work today as well as scheduled a EMG and nerve conduction velocity test in our office. Testing may be somewhat challenging, we may have to limit EMG testing to one lower extremity as he is on a blood thinner. He also has significant swelling. We will keep him posted as to his test results by phone call. He is advised that often we do not find an obvious cause for nerve damage or neuropathy, the most common cause is typically diabetes or prediabetes. We talked about the possibility of utilizing symptomatic treatment such as gabapentin but I also advised him that we will have to be cautious with any new medication due to side effect concerns. He is already reporting some issues with his balance. Thankfully, he has not fallen in the recent past. We will proceed with testing and plan to follow-up afterwards. I answered all the questions today and the patient and his wife were in agreement.  Thank you very much for allowing me to participate in the care of this nice patient. If I  can be of any further assistance to  you please do not hesitate to call me at [phone].  Sincerely,   Star Age, MD, PhD

## 2020-08-23 ENCOUNTER — Telehealth: Payer: Self-pay

## 2020-08-23 DIAGNOSIS — K4090 Unilateral inguinal hernia, without obstruction or gangrene, not specified as recurrent: Secondary | ICD-10-CM | POA: Diagnosis not present

## 2020-08-23 NOTE — Telephone Encounter (Signed)
I called pt. I advised him of his lab results. He will follow up with his PCP for further management. Pt verbalized understanding of results. Pt had no questions at this time but was encouraged to call back if questions arise.

## 2020-08-23 NOTE — Telephone Encounter (Signed)
-----   Message from Star Age, MD sent at 08/23/2020 10:24 AM EST ----- Please call patient and advise him that his blood tests were benign with the exception of his A1c which is a marker for diabetes and prediabetes.  His hemoglobin A1c was in the prediabetes range at 6.2.  Sometimes patients with prediabetes can develop nerve damage or what we call neuropathy.  All results otherwise were fine, including inflammatory and autoimmune markers, muscle enzyme, heavy metals and protein breakdown in the blood.  1 test is pending which is his vitamin B1, we will update if abnormal. I would recommend that he continue follow-up with his primary care physician regarding management of prediabetes and potential medication for this and ongoing screening.

## 2020-08-23 NOTE — Progress Notes (Signed)
Please call patient and advise him that his blood tests were benign with the exception of his A1c which is a marker for diabetes and prediabetes.  His hemoglobin A1c was in the prediabetes range at 6.2.  Sometimes patients with prediabetes can develop nerve damage or what we call neuropathy.  All results otherwise were fine, including inflammatory and autoimmune markers, muscle enzyme, heavy metals and protein breakdown in the blood.  1 test is pending which is his vitamin B1, we will update if abnormal. I would recommend that he continue follow-up with his primary care physician regarding management of prediabetes and potential medication for this and ongoing screening.

## 2020-08-25 LAB — RPR: RPR Ser Ql: NONREACTIVE

## 2020-08-25 LAB — MULTIPLE MYELOMA PANEL, SERUM
Albumin SerPl Elph-Mcnc: 4.1 g/dL (ref 2.9–4.4)
Albumin/Glob SerPl: 1.4 (ref 0.7–1.7)
Alpha 1: 0.3 g/dL (ref 0.0–0.4)
Alpha2 Glob SerPl Elph-Mcnc: 0.9 g/dL (ref 0.4–1.0)
B-Globulin SerPl Elph-Mcnc: 1 g/dL (ref 0.7–1.3)
Gamma Glob SerPl Elph-Mcnc: 1.1 g/dL (ref 0.4–1.8)
Globulin, Total: 3.1 g/dL (ref 2.2–3.9)
IgA/Immunoglobulin A, Serum: 245 mg/dL (ref 61–437)
IgG (Immunoglobin G), Serum: 989 mg/dL (ref 603–1613)
IgM (Immunoglobulin M), Srm: 38 mg/dL (ref 15–143)
Total Protein: 7.2 g/dL (ref 6.0–8.5)

## 2020-08-25 LAB — HEAVY METALS PROFILE II, BLOOD
Arsenic: 2 ug/L (ref 2–23)
Cadmium: <0.5 ug/L (ref 0.0–1.2)
Lead, Blood: 1 ug/dL (ref 0–4)
Mercury: <1 ug/L (ref 0.0–14.9)

## 2020-08-25 LAB — ANA W/REFLEX: Anti Nuclear Antibody (ANA): NEGATIVE

## 2020-08-25 LAB — CK: Total CK: 57 U/L (ref 30–208)

## 2020-08-25 LAB — RHEUMATOID FACTOR: Rheumatoid fact SerPl-aCnc: <10 IU/mL (ref <14.0)

## 2020-08-25 LAB — SEDIMENTATION RATE: Sed Rate: 10 mm/hr (ref 0–30)

## 2020-08-25 LAB — VITAMIN B1: Thiamine: 132.6 nmol/L (ref 66.5–200.0)

## 2020-08-25 LAB — C-REACTIVE PROTEIN: CRP: 1 mg/L (ref 0–10)

## 2020-08-25 LAB — VITAMIN B6: Vitamin B6: 9.8 ug/L (ref 5.3–46.7)

## 2020-08-25 LAB — HGB A1C W/O EAG: Hgb A1c MFr Bld: 6.2 % — ABNORMAL HIGH (ref 4.8–5.6)

## 2020-08-28 ENCOUNTER — Ambulatory Visit (INDEPENDENT_AMBULATORY_CARE_PROVIDER_SITE_OTHER): Payer: Medicare Other

## 2020-08-28 DIAGNOSIS — I4821 Permanent atrial fibrillation: Secondary | ICD-10-CM | POA: Diagnosis not present

## 2020-08-30 LAB — CUP PACEART REMOTE DEVICE CHECK
Battery Impedance: 1391 Ohm
Battery Remaining Longevity: 52 mo
Battery Voltage: 2.77 V
Brady Statistic RV Percent Paced: 20 %
Date Time Interrogation Session: 20220113102241
Implantable Lead Implant Date: 20080818
Implantable Lead Implant Date: 20080818
Implantable Lead Location: 753859
Implantable Lead Location: 753860
Implantable Lead Model: 4469
Implantable Lead Model: 4470
Implantable Lead Serial Number: 499409
Implantable Lead Serial Number: 602985
Implantable Pulse Generator Implant Date: 20150320
Lead Channel Impedance Value: 464 Ohm
Lead Channel Impedance Value: 67 Ohm
Lead Channel Pacing Threshold Amplitude: 1.25 V
Lead Channel Pacing Threshold Pulse Width: 0.4 ms
Lead Channel Setting Pacing Amplitude: 2.5 V
Lead Channel Setting Pacing Pulse Width: 0.4 ms
Lead Channel Setting Sensing Sensitivity: 5.6 mV

## 2020-09-05 ENCOUNTER — Encounter: Payer: Medicare Other | Admitting: Neurology

## 2020-09-10 NOTE — Progress Notes (Signed)
Remote pacemaker transmission.   

## 2020-09-12 ENCOUNTER — Encounter: Payer: Self-pay | Admitting: Neurology

## 2020-09-12 ENCOUNTER — Ambulatory Visit: Payer: Medicare Other | Admitting: Podiatry

## 2020-09-24 ENCOUNTER — Ambulatory Visit: Payer: Medicare Other | Admitting: Neurology

## 2020-09-26 ENCOUNTER — Encounter (HOSPITAL_BASED_OUTPATIENT_CLINIC_OR_DEPARTMENT_OTHER): Payer: Medicare Other | Admitting: Internal Medicine

## 2020-10-03 ENCOUNTER — Ambulatory Visit (INDEPENDENT_AMBULATORY_CARE_PROVIDER_SITE_OTHER): Payer: Medicare Other | Admitting: Podiatry

## 2020-10-03 ENCOUNTER — Encounter: Payer: Self-pay | Admitting: Podiatry

## 2020-10-03 ENCOUNTER — Other Ambulatory Visit: Payer: Self-pay

## 2020-10-03 DIAGNOSIS — B351 Tinea unguium: Secondary | ICD-10-CM | POA: Diagnosis not present

## 2020-10-03 DIAGNOSIS — D2372 Other benign neoplasm of skin of left lower limb, including hip: Secondary | ICD-10-CM

## 2020-10-03 DIAGNOSIS — D2371 Other benign neoplasm of skin of right lower limb, including hip: Secondary | ICD-10-CM | POA: Diagnosis not present

## 2020-10-03 DIAGNOSIS — M79676 Pain in unspecified toe(s): Secondary | ICD-10-CM

## 2020-10-03 DIAGNOSIS — D689 Coagulation defect, unspecified: Secondary | ICD-10-CM

## 2020-10-06 NOTE — Progress Notes (Signed)
He presents today chief complaint of painfully elongated toenails 1 through 5 bilaterally.  Objective: Toenails are long thick yellow dystrophic clinical mycotic painful palpation 1 through 5 bilateral.  Assessment: Pain in limb secondary onychomycosis porokeratosis benign skin lesions.  Plan: Debridement of benign skin lesions and debridement of nails 1 through 5 bilateral.

## 2020-10-07 DIAGNOSIS — H401132 Primary open-angle glaucoma, bilateral, moderate stage: Secondary | ICD-10-CM | POA: Diagnosis not present

## 2020-10-09 DIAGNOSIS — Z85828 Personal history of other malignant neoplasm of skin: Secondary | ICD-10-CM | POA: Diagnosis not present

## 2020-10-09 DIAGNOSIS — D225 Melanocytic nevi of trunk: Secondary | ICD-10-CM | POA: Diagnosis not present

## 2020-10-09 DIAGNOSIS — L570 Actinic keratosis: Secondary | ICD-10-CM | POA: Diagnosis not present

## 2020-10-09 DIAGNOSIS — L718 Other rosacea: Secondary | ICD-10-CM | POA: Diagnosis not present

## 2020-10-09 DIAGNOSIS — L814 Other melanin hyperpigmentation: Secondary | ICD-10-CM | POA: Diagnosis not present

## 2020-10-09 DIAGNOSIS — L821 Other seborrheic keratosis: Secondary | ICD-10-CM | POA: Diagnosis not present

## 2020-10-09 DIAGNOSIS — D692 Other nonthrombocytopenic purpura: Secondary | ICD-10-CM | POA: Diagnosis not present

## 2020-10-09 DIAGNOSIS — D1801 Hemangioma of skin and subcutaneous tissue: Secondary | ICD-10-CM | POA: Diagnosis not present

## 2020-10-15 ENCOUNTER — Other Ambulatory Visit (HOSPITAL_COMMUNITY): Payer: Medicare Other

## 2020-10-15 ENCOUNTER — Ambulatory Visit: Payer: Medicare Other | Admitting: Vascular Surgery

## 2020-10-23 ENCOUNTER — Ambulatory Visit: Payer: Medicare Other | Admitting: Neurology

## 2020-10-23 NOTE — Telephone Encounter (Signed)
Error

## 2020-11-07 ENCOUNTER — Encounter (HOSPITAL_BASED_OUTPATIENT_CLINIC_OR_DEPARTMENT_OTHER): Payer: Medicare Other | Attending: Physician Assistant | Admitting: Internal Medicine

## 2020-11-07 ENCOUNTER — Other Ambulatory Visit: Payer: Self-pay

## 2020-11-07 DIAGNOSIS — Z7901 Long term (current) use of anticoagulants: Secondary | ICD-10-CM | POA: Diagnosis not present

## 2020-11-07 DIAGNOSIS — G609 Hereditary and idiopathic neuropathy, unspecified: Secondary | ICD-10-CM | POA: Diagnosis not present

## 2020-11-07 DIAGNOSIS — L97218 Non-pressure chronic ulcer of right calf with other specified severity: Secondary | ICD-10-CM | POA: Diagnosis not present

## 2020-11-07 DIAGNOSIS — I872 Venous insufficiency (chronic) (peripheral): Secondary | ICD-10-CM | POA: Insufficient documentation

## 2020-11-07 DIAGNOSIS — I87301 Chronic venous hypertension (idiopathic) without complications of right lower extremity: Secondary | ICD-10-CM | POA: Diagnosis not present

## 2020-11-07 DIAGNOSIS — Z8249 Family history of ischemic heart disease and other diseases of the circulatory system: Secondary | ICD-10-CM | POA: Diagnosis not present

## 2020-11-07 DIAGNOSIS — Z833 Family history of diabetes mellitus: Secondary | ICD-10-CM | POA: Diagnosis not present

## 2020-11-07 DIAGNOSIS — Z87891 Personal history of nicotine dependence: Secondary | ICD-10-CM | POA: Insufficient documentation

## 2020-11-07 DIAGNOSIS — I4891 Unspecified atrial fibrillation: Secondary | ICD-10-CM | POA: Diagnosis not present

## 2020-11-07 DIAGNOSIS — I87311 Chronic venous hypertension (idiopathic) with ulcer of right lower extremity: Secondary | ICD-10-CM | POA: Diagnosis not present

## 2020-11-07 DIAGNOSIS — I509 Heart failure, unspecified: Secondary | ICD-10-CM | POA: Diagnosis not present

## 2020-11-07 DIAGNOSIS — I2510 Atherosclerotic heart disease of native coronary artery without angina pectoris: Secondary | ICD-10-CM | POA: Insufficient documentation

## 2020-11-08 NOTE — Progress Notes (Signed)
EDSON, DERIDDER ([id number]) Visit Report for 11/07/2020 Abuse/Suicide Risk Screen Details Patient Name: Date of Service: Adam [name], Adam [name] 11/07/2020 9:00 A M Medical Record Number: [id number] Patient Account Number: [id number] Date of Birth/Sex: Treating RN: [birth year] (87 y.o. Male) Lorrin Jackson Primary Care Larone Kliethermes: Lona Kettle Other Clinician: Referring Avant Printy: Treating Jermika Olden/Extender: Dian Queen in Treatment: 0 Abuse/Suicide Risk Screen Items Answer ABUSE RISK SCREEN: Has anyone close to you tried to hurt or harm you recentlyo No Do you feel uncomfortable with anyone in your familyo No Has anyone forced you do things that you didnt want to doo No Electronic Signature(s) Signed: 11/08/2020 6:16:52 PM By: Lorrin Jackson Entered By: Lorrin Jackson on 11/07/2020 09:13:04 -------------------------------------------------------------------------------- Activities of Daily Living Details Patient Name: Date of Service: Adam [name], Adam [name] 11/07/2020 9:00 A M Medical Record Number: [id number] Patient Account Number: [id number] Date of Birth/Sex: Treating RN: 1924-10-13 (87 y.o. Male) Lorrin Jackson Primary Care Wilburn Keir: Lona Kettle Other Clinician: Referring Mitzy Naron: Treating Tajha Sammarco/Extender: Dian Queen in Treatment: 0 Activities of Daily Living Items Answer Activities of Daily Living (Please select one for each item) Drive Automobile Not Able T Medications ake Completely Able Use T elephone Completely Able Care for Appearance Completely Able Use T oilet Completely Able Bath / Shower Completely Able Dress Self Completely Able Feed Self Completely Able Walk Completely Able Get In / Out Bed Completely Able Housework Completely Able Prepare Meals Completely [place] Completely Able Shop for Self Need Assistance Electronic Signature(s) Signed: 11/08/2020 6:16:52 PM By: Lorrin Jackson Entered By: Lorrin Jackson on 11/07/2020 09:13:49 -------------------------------------------------------------------------------- Education Screening Details Patient Name: Date of Service: Adam Burnet [name] 11/07/2020 9:00 A M Medical Record Number: [id number] Patient Account Number: [id number] Date of Birth/Sex: Treating RN: 1925/02/21 (87 y.o. Male) Lorrin Jackson Primary Care Harlis Champoux: Lona Kettle Other Clinician: Referring Nasiya Pascual: Treating Yanelly Cantrelle/Extender: Dian Queen in Treatment: 0 Primary Learner Assessed: Patient Learning Preferences/Education Level/Primary Language Learning Preference: Explanation, Demonstration, Printed Material Highest Education Level: College or Above Preferred Language: English Cognitive Barrier Language Barrier: No Translator Needed: No Memory Deficit: No Emotional Barrier: No Cultural/Religious Beliefs Affecting Medical Care: No Physical Barrier Impaired Vision: Yes Glasses Impaired Hearing: No Decreased Hand dexterity: No Knowledge/Comprehension Knowledge Level: High Comprehension Level: High Ability to understand written instructions: High Ability to understand verbal instructions: High Motivation Anxiety Level: Calm Cooperation: Cooperative Education Importance: Acknowledges Need Interest in Health Problems: Asks Questions Perception: Coherent Willingness to Engage in Self-Management High Activities: Readiness to Engage in Self-Management High Activities: Electronic Signature(s) Signed: 11/08/2020 6:16:52 PM By: Lorrin Jackson Entered By: Lorrin Jackson on 11/07/2020 09:14:20 -------------------------------------------------------------------------------- Fall Risk Assessment Details Patient Name: Date of Service: Adam Burnet [name] 11/07/2020 9:00 [place] Record Number: [id number] Patient Account Number: [id number] Date of Birth/Sex: Treating RN: 1925-02-04 (87 y.o. Male) Lorrin Jackson Primary Care Dabney Schanz: Lona Kettle Other Clinician: Referring Jaretssi Kraker: Treating Jaidynn Balster/Extender: Dian Queen in Treatment: 0 Fall Risk Assessment Items Have you had 2 or more falls in the last 12 monthso 0 No Have you had any fall that resulted in injury in the last 12 monthso 0 No FALLS RISK SCREEN History of falling - immediate or within 3 months 0 No Secondary diagnosis (Do you have 2 or more medical diagnoseso) 0 No Ambulatory aid None/bed rest/wheelchair/nurse 0 Yes Crutches/cane/walker 0 No Furniture 0 No Intravenous therapy Access/Saline/Heparin Lock 0 No Gait/Transferring Normal/ bed rest/ wheelchair 0 Yes Weak (short steps with  or without shuffle, stooped but able to lift head while walking, may seek 0 No support from furniture) Impaired (short steps with shuffle, may have difficulty arising from chair, head down, impaired 0 No balance) Mental Status Oriented to own ability 0 Yes Electronic Signature(s) Signed: 11/08/2020 6:16:52 PM By: Lorrin Jackson Entered By: Lorrin Jackson on 11/07/2020 09:14:38 -------------------------------------------------------------------------------- Foot Assessment Details Patient Name: Date of Service: Adam Burnet [name] 11/07/2020 9:00 A M Medical Record Number: [id number] Patient Account Number: [id number] Date of Birth/Sex: Treating RN: [birth year] (87 y.o. Male) Lorrin Jackson Primary Care Saraih Lorton: Lona Kettle Other Clinician: Referring Kairee Isa: Treating Travious Vanover/Extender: Dian Queen in Treatment: 0 Foot Assessment Items Site Locations + = Sensation present, - = Sensation absent, C = Callus, U = Ulcer R = Redness, W = Warmth, M = Maceration, PU = Pre-ulcerative lesion F = Fissure, S = Swelling, D = Dryness Assessment Right: Left: Other Deformity: No No Prior Foot Ulcer: No No Prior Amputation: No No Charcot Joint: No No Ambulatory Status: Ambulatory Without Help Gait: Steady Electronic  Signature(s) Signed: 11/08/2020 6:16:52 PM By: Lorrin Jackson Entered By: Lorrin Jackson on 11/07/2020 09:22:03 -------------------------------------------------------------------------------- Nutrition Risk Screening Details Patient Name: Date of Service: Adam Burnet [name] 11/07/2020 9:00 A M Medical Record Number: [id number] Patient Account Number: [id number] Date of Birth/Sex: Treating RN: 02/18/1925 (87 y.o. Male) Lorrin Jackson Primary Care Shirl Ludington: Lona Kettle Other Clinician: Referring Dagmawi Venable: Treating Annaleia Pence/Extender: Dian Queen in Treatment: 0 Height (in): 68 Weight (lbs): 155 Body Mass Index (BMI): 23.6 Nutrition Risk Screening Items Score Screening NUTRITION RISK SCREEN: I have an illness or condition that made me change the kind and/or amount of food I eat 0 No I eat fewer than two meals per day 0 No I eat few fruits and vegetables, or milk products 0 No I have three or more drinks of beer, liquor or wine almost every day 0 No I have tooth or mouth problems that make it hard for me to eat 0 No I don't always have enough money to buy the food I need 0 No I eat alone most of the time 0 No I take three or more different prescribed or over-the-counter drugs a day 1 Yes Without wanting to, I have lost or gained 10 pounds in the last six months 0 No I am not always physically able to shop, cook and/or feed myself 0 No Nutrition Protocols Good Risk Protocol 0 No interventions needed Moderate Risk Protocol High Risk Proctocol Risk Level: Good Risk Score: 1 Electronic Signature(s) Signed: 11/08/2020 6:16:52 PM By: Lorrin Jackson Entered By: Lorrin Jackson on 11/07/2020 09:14:51

## 2020-11-08 NOTE — Progress Notes (Signed)
DENT, PLANTZ ([id number]) Visit Report for 11/07/2020 Chief Complaint Document Details Patient Name: Date of Service: Adam [name], Adam [name] 11/07/2020 9:00 A M Medical Record Number: [id number] Patient Account Number: [id number] Date of Birth/Sex: Treating RN: Oct 14, 1924 (87 y.o. Male) Deon Pilling Primary Care Provider: Lona Kettle Other Clinician: Referring Provider: Treating Provider/Extender: Dian Queen in Treatment: 0 Information Obtained from: Patient Chief Complaint 11/07/2020; patient is here for review of the wound/skin area on the right posterior upper calf Electronic Signature(s) Signed: 11/07/2020 4:40:49 PM By: Linton Ham MD Entered By: Linton Ham on 11/07/2020 10:53:10 -------------------------------------------------------------------------------- HPI Details Patient Name: Date of Service: Adam Burnet [name] 11/07/2020 9:00 A M Medical Record Number: [id number] Patient Account Number: [id number] Date of Birth/Sex: Treating RN: 06/22/1925 (87 y.o. Male) Deon Pilling Primary Care Provider: Lona Kettle Other Clinician: Referring Provider: Treating Provider/Extender: Dian Queen in Treatment: 0 History of Present Illness HPI Description: ADMISSION 11/07/2020; This is 87 year old man who arrived accompanied by his wife. He apparently traumatized the back of his right upper calf some months ago this took a while to get to heal his wife's been putting topical antibiotics and a Band-Aid. Although initial history suggested at this had been seen by Dr. Ubaldo Glassing of dermatology his wife said she never really saw this area but she follows him for other issues. He comes in today and the area in question is not open. The patient states that he has a lot of pain in the right leg partially related to some form of idiopathic peripheral neuropathy but he locates pain in this area Past medical history includes CHF, A. fib on Eliquis,  AAA repair, BPH, idiopathic neuropathy, coronary artery disease status post CABG, squamous cell CA of the skin not exactly sure why, epidermoid cyst, pacemaker His ABIs were noncompressible bilaterally Electronic Signature(s) Signed: 11/07/2020 4:40:49 PM By: Linton Ham MD Entered By: Linton Ham on 11/07/2020 10:55:04 -------------------------------------------------------------------------------- Physical Exam Details Patient Name: Date of Service: Adam Burnet [name] 11/07/2020 9:00 A M Medical Record Number: [id number] Patient Account Number: [id number] Date of Birth/Sex: Treating RN: May 07, 1925 (87 y.o. Male) Deon Pilling Primary Care Provider: Lona Kettle Other Clinician: Referring Provider: Treating Provider/Extender: Dian Queen in Treatment: 0 Constitutional Sitting or standing Blood Pressure is within target range for patient.. Pulse regular and within target range for patient.[name] Respirations regular, non-labored and within target range.. Temperature is normal and within the target range for the patient.[name] Appears in no distress. Cardiovascular Pedal pulses are palpable. Severe chronic venous insufficiency bilaterally marked hemosiderin deposition scattered purpura. Notes Wound exam; the patient does not have anything open. He has a nodular irregular area where he relates he traumatized his right upper calf. There are 2 separate areas here are somewhat discolored and somewhat irregular. They are nontender there is no evidence of infection. Electronic Signature(s) Signed: 11/07/2020 4:40:49 PM By: Linton Ham MD Entered By: Linton Ham on 11/07/2020 10:57:51 -------------------------------------------------------------------------------- Physician Orders Details Patient Name: Date of Service: Adam Burnet [name] 11/07/2020 9:00 A M Medical Record Number: [id number] Patient Account Number: [id number] Date of Birth/Sex: Treating RN: 12/04/1924  (87 y.o. Male) Deon Pilling Primary Care Provider: Lona Kettle Other Clinician: Referring Provider: Treating Provider/Extender: Dian Queen in Treatment: 0 Verbal / Phone Orders: No Diagnosis Coding Discharge From [facility] Services Discharge from [place] - call if any future wound care needs. Non Wound Condition Other Non Wound Condition Orders/Instructions: - Keep pressure off of  the back of the right leg. T photos of this area incase enlargement. ake Monitor daily if any larger need to see Dermatology Dr. Rolm Bookbinder for possibility of biopsy. Electronic Signature(s) Signed: 11/07/2020 4:40:49 PM By: Linton Ham MD Signed: 11/07/2020 4:43:39 PM By: Deon Pilling Entered By: Deon Pilling on 11/07/2020 10:14:05 -------------------------------------------------------------------------------- Problem List Details Patient Name: Date of Service: Adam Burnet [name] 11/07/2020 9:00 A M Medical Record Number: [id number] Patient Account Number: [id number] Date of Birth/Sex: Treating RN: 23-Aug-1924 (87 y.o. Male) Deon Pilling Primary Care Provider: Lona Kettle Other Clinician: Referring Provider: Treating Provider/Extender: Dian Queen in Treatment: 0 Active Problems ICD-10 Encounter Code Description Active Date MDM Diagnosis L97.218 Non-pressure chronic ulcer of right calf with other specified severity 11/07/2020 No Yes S80.811D Abrasion, right lower leg, subsequent encounter 11/07/2020 No Yes I87.301 Chronic venous hypertension (idiopathic) without complications of right lower 11/07/2020 No Yes extremity Inactive Problems Resolved Problems Electronic Signature(s) Signed: 11/07/2020 4:40:49 PM By: Linton Ham MD Entered By: Linton Ham on 11/07/2020 10:46:51 -------------------------------------------------------------------------------- Progress Note Details Patient Name: Date of Service: Adam Burnet [name]  11/07/2020 9:00 A M Medical Record Number: [id number] Patient Account Number: [id number] Date of Birth/Sex: Treating RN: 13-Aug-1925 (87 y.o. Male) Deon Pilling Primary Care Provider: Lona Kettle Other Clinician: Referring Provider: Treating Provider/Extender: Dian Queen in Treatment: 0 Subjective Chief Complaint Information obtained from Patient 11/07/2020; patient is here for review of the wound/skin area on the right posterior upper calf History of Present Illness (HPI) ADMISSION 11/07/2020; This is 87 year old man who arrived accompanied by his wife. He apparently traumatized the back of his right upper calf some months ago this took a while to get to heal his wife's been putting topical antibiotics and a Band-Aid. Although initial history suggested at this had been seen by Dr. Ubaldo Glassing of dermatology his wife said she never really saw this area but she follows him for other issues. He comes in today and the area in question is not open. The patient states that he has a lot of pain in the right leg partially related to some form of idiopathic peripheral neuropathy but he locates pain in this area Past medical history includes CHF, A. fib on Eliquis, AAA repair, BPH, idiopathic neuropathy, coronary artery disease status post CABG, squamous cell CA of the skin not exactly sure why, epidermoid cyst, pacemaker His ABIs were noncompressible bilaterally Patient History Information obtained from Patient. Allergies No Known Allergies Family History Cancer - Siblings, Diabetes - Paternal Grandparents, Heart Disease - Father, Hypertension - Father, No family history of Hereditary Spherocytosis, Kidney Disease, Lung Disease, Seizures, Stroke, Thyroid Problems, Tuberculosis. Social History Former smoker, Marital Status - Married, Alcohol Use - Rarely, Drug Use - No History, Caffeine Use - Rarely. Medical History Eyes Patient has history of Cataracts,  Glaucoma Cardiovascular Patient has history of Arrhythmia, Congestive Heart Failure, Coronary Artery Disease Denies history of Myocardial Infarction Musculoskeletal Patient has history of Osteoarthritis Neurologic Patient has history of Neuropathy Medical A Surgical History Notes nd Cardiovascular AAA Gastrointestinal Diverticulitis Review of Systems (ROS) Eyes Complains or has symptoms of Glasses / Contacts. Ear/Nose/Mouth/Throat Denies complaints or symptoms of Chronic sinus problems or rhinitis. Respiratory Denies complaints or symptoms of Chronic or frequent coughs, Shortness of Breath. Gastrointestinal Denies complaints or symptoms of Frequent diarrhea, Nausea, Vomiting. Endocrine Denies complaints or symptoms of Heat/cold intolerance. Genitourinary Denies complaints or symptoms of Frequent urination. Integumentary (Skin) Complains or has symptoms of Wounds. Psychiatric Denies complaints or  symptoms of Claustrophobia, Suicidal. Objective Constitutional Sitting or standing Blood Pressure is within target range for patient.. Pulse regular and within target range for patient.[name] Respirations regular, non-labored and within target range.. Temperature is normal and within the target range for the patient.[name] Appears in no distress. Vitals Time Taken: 8:59 AM, Height: 68 in, Source: Stated, Weight: 155 lbs, Source: Stated, BMI: 23.6, Temperature: 97.8 F, Pulse: 69 bpm, Respiratory Rate: 16 breaths/min, Blood Pressure: 145/73 mmHg. Cardiovascular Pedal pulses are palpable. Severe chronic venous insufficiency bilaterally marked hemosiderin deposition scattered purpura. General Notes: Wound exam; the patient does not have anything open. He has a nodular irregular area where he relates he traumatized his right upper calf. There are 2 separate areas here are somewhat discolored and somewhat irregular. They are nontender there is no evidence of infection. Assessment Active  Problems ICD-10 Non-pressure chronic ulcer of right calf with other specified severity Abrasion, right lower leg, subsequent encounter Chronic venous hypertension (idiopathic) without complications of right lower extremity Plan Discharge From [facility] Services: Discharge from [place] - call if any future wound care needs. Non Wound Condition: Other Non Wound Condition Orders/Instructions: - Keep pressure off of the back of the right leg. T photos of this area incase enlargement. Monitor daily if ake any larger need to see Dermatology Dr. Rolm Bookbinder for possibility of biopsy. 1. Patient relates a traumatic area on the right posterior calf however there is nothing open here however the remaining tissue is nodular raised and according to the patient painful but I can see no good reason why that should be the case. 2. The skin pigmentation is somewhat discolored had I thought up about certainly whether this was going to need to be biopsied at some point however I would wait and see if this expands and then I told the patient and his wife that they would need to see Dr. Ubaldo Glassing 3. I guess the alternate of explanation is an irregular scar tissue 4. Nevertheless he did not have an open wound in this area. Certainly no reason for all the pain he describes although this all may be neuropathic pain Electronic Signature(s) Signed: 11/07/2020 4:40:49 PM By: Linton Ham MD Entered By: Linton Ham on 11/07/2020 10:59:18 -------------------------------------------------------------------------------- HxROS Details Patient Name: Date of Service: Adam Burnet [name] 11/07/2020 9:00 A M Medical Record Number: [id number] Patient Account Number: [id number] Date of Birth/Sex: Treating RN: 09-27-1924 (87 y.o. Male) Lorrin Jackson Primary Care Provider: Lona Kettle Other Clinician: Referring Provider: Treating Provider/Extender: Dian Queen in Treatment: 0 Information  Obtained From Patient Eyes Complaints and Symptoms: Positive for: Glasses / Contacts Medical History: Positive for: Cataracts; Glaucoma Ear/Nose/Mouth/Throat Complaints and Symptoms: Negative for: Chronic sinus problems or rhinitis Respiratory Complaints and Symptoms: Negative for: Chronic or frequent coughs; Shortness of Breath Gastrointestinal Complaints and Symptoms: Negative for: Frequent diarrhea; Nausea; Vomiting Medical History: Past Medical History Notes: Diverticulitis Endocrine Complaints and Symptoms: Negative for: Heat/cold intolerance Genitourinary Complaints and Symptoms: Negative for: Frequent urination Integumentary (Skin) Complaints and Symptoms: Positive for: Wounds Psychiatric Complaints and Symptoms: Negative for: Claustrophobia; Suicidal Hematologic/Lymphatic Cardiovascular Medical History: Positive for: Arrhythmia; Congestive Heart Failure; Coronary Artery Disease Negative for: Myocardial Infarction Past Medical History Notes: AAA Immunological Musculoskeletal Medical History: Positive for: Osteoarthritis Neurologic Medical History: Positive for: Neuropathy Oncologic HBO Extended History Items Eyes: Eyes: Cataracts Glaucoma Immunizations Pneumococcal Vaccine: Received Pneumococcal Vaccination: Yes Implantable Devices Yes Family and Social History Cancer: Yes - Siblings; Diabetes: Yes - Paternal Grandparents; Heart Disease: Yes -  Father; Hereditary Spherocytosis: No; Hypertension: Yes - Father; Kidney Disease: No; Lung Disease: No; Seizures: No; Stroke: No; Thyroid Problems: No; Tuberculosis: No; Former smoker; Marital Status - Married; Alcohol Use: Rarely; Drug Use: No History; Caffeine Use: Rarely; Financial Concerns: No; Food, Clothing or Shelter Needs: No; Support System Lacking: No; Transportation Concerns: No Engineer, maintenance) Signed: 11/07/2020 4:40:49 PM By: Linton Ham MD Signed: 11/08/2020 6:16:52 PM By: Lorrin Jackson Entered By: Lorrin Jackson on 11/07/2020 09:12:57 -------------------------------------------------------------------------------- [place] Details Patient Name: Date of Service: Adam Burnet [name] 11/07/2020 Medical Record Number: [id number] Patient Account Number: [id number] Date of Birth/Sex: Treating RN: 1925/07/12 (87 y.o. Male) Deon Pilling Primary Care Provider: Lona Kettle Other Clinician: Referring Provider: Treating Provider/Extender: Dian Queen in Treatment: 0 Diagnosis Coding ICD-10 Codes Code Description [phone] Non-pressure chronic ulcer of right calf with other specified severity S80.811D Abrasion, right lower leg, subsequent encounter I87.301 Chronic venous hypertension (idiopathic) without complications of right lower extremity Facility Procedures CPT4 Code: [id number] Description: 99213 - WOUND CARE VISIT-LEV 3 EST PT Modifier: Quantity: 1 Physician Procedures : CPT4 Code Description Modifier [id number] - WC PHYS LEVEL 2 - NEW PT ICD-10 Diagnosis Description F90.122 Non-pressure chronic ulcer of right calf with other specified severity S80.811D Abrasion, right lower leg, subsequent encounter I87.301 Chronic  venous hypertension (idiopathic) without complications of right lower extremity Quantity: 1 Electronic Signature(s) Signed: 11/07/2020 4:40:49 PM By: Linton Ham MD Entered By: Linton Ham on 11/07/2020 10:59:44

## 2020-11-08 NOTE — Progress Notes (Signed)
[name], [name] ([id number]) Visit Report for 11/07/2020 Allergy List Details Patient Name: Date of Service: Adam [name], Adam Adam R. 11/07/2020 9:00 A M Medical Record Number: [id number] Patient Account Number: [id number] Date of Birth/Sex: Treating RN: Dec 19, 1924 (87 y.o. Male) Lorrin Jackson Primary Care Vergia Chea: Lona Kettle Other Clinician: Referring Izabelle Daus: Treating Hairo Garraway/Extender: Dian Queen in Treatment: 0 Allergies Active Allergies No Known Allergies Allergy Notes Electronic Signature(s) Signed: 11/08/2020 6:16:52 PM By: Lorrin Jackson Entered By: Lorrin Jackson on 11/07/2020 09:04:49 -------------------------------------------------------------------------------- Arrival Information Details Patient Name: Date of Service: Adam Burnet Adam R. 11/07/2020 9:00 A M Medical Record Number: [id number] Patient Account Number: [id number] Date of Birth/Sex: Treating RN: 1924/12/11 (87 y.o. Male) Lorrin Jackson Primary Care Bronnie Vasseur: Lona Kettle Other Clinician: Referring Ilyaas Musto: Treating Kerianna Rawlinson/Extender: Dian Queen in Treatment: 0 Visit Information Patient Arrived: Ambulatory Arrival Time: 08:58 Accompanied By: Wife Transfer Assistance: None Patient Identification Verified: Yes Secondary Verification Process Completed: Yes Patient Requires Transmission-Based Precautions: No Patient Has Alerts: Yes Patient Alerts: Patient on Blood Thinner Pacemaker ABI-Non Comp bilat Electronic Signature(s) Signed: 11/08/2020 6:16:52 PM By: Lorrin Jackson Entered By: Lorrin Jackson on 11/07/2020 09:47:30 -------------------------------------------------------------------------------- Clinic Level of Care Assessment Details Patient Name: Date of Service: [name], [name] Adam R. 11/07/2020 9:00 A M Medical Record Number: [id number] Patient Account Number: [id number] Date of Birth/Sex: Treating RN: December 10, 1924 (87 y.o. Male) Deon Pilling Primary  Care Abdulhamid Olgin: Lona Kettle Other Clinician: Referring Kiaja Shorty: Treating Tedi Hughson/Extender: Dian Queen in Treatment: 0 Clinic Level of Care Assessment Items TOOL 4 Quantity Score X- 1 0 Use when only an EandM is performed on FOLLOW-UP visit ASSESSMENTS - Nursing Assessment / Reassessment X- 1 10 Reassessment of Co-morbidities (includes updates in patient status) X- 1 5 Reassessment of Adherence to Treatment Plan ASSESSMENTS - Wound and Skin A ssessment / Reassessment []  - 0 Simple Wound Assessment / Reassessment - one wound []  - 0 Complex Wound Assessment / Reassessment - multiple wounds X- 1 10 Dermatologic / Skin Assessment (not related to wound area) ASSESSMENTS - Focused Assessment X- 1 5 Circumferential Edema Measurements - multi extremities X- 1 10 Nutritional Assessment / Counseling / Intervention []  - 0 Lower Extremity Assessment (monofilament, tuning fork, pulses) []  - 0 Peripheral Arterial Disease Assessment (using hand held doppler) ASSESSMENTS - Ostomy and/or Continence Assessment and Care []  - 0 Incontinence Assessment and Management []  - 0 Ostomy Care Assessment and Management (repouching, etc.) PROCESS - Coordination of Care X - Simple Patient / Family Education for ongoing care 1 15 []  - 0 Complex (extensive) Patient / Family Education for ongoing care X- 1 10 Staff obtains Programmer, systems, Records, T Results / Process Orders est []  - 0 Staff telephones HHA, Nursing Homes / Clarify orders / etc []  - 0 Routine Transfer to another Facility (non-emergent condition) []  - 0 Routine Hospital Admission (non-emergent condition) X- 1 15 New Admissions / Biomedical engineer / Ordering NPWT Apligraf, etc. , []  - 0 Emergency Hospital Admission (emergent condition) X- 1 10 Simple Discharge Coordination []  - 0 Complex (extensive) Discharge Coordination PROCESS - Special Needs []  - 0 Pediatric / Minor Patient Management []  -  0 Isolation Patient Management []  - 0 Hearing / Language / Visual special needs []  - 0 Assessment of Community assistance (transportation, D/C planning, etc.) []  - 0 Additional assistance / Altered mentation []  - 0 Support Surface(s) Assessment (bed, cushion, seat, etc.) INTERVENTIONS - Wound Cleansing / Measurement []  - 0 Simple Wound Cleansing - one wound []  -  0 Complex Wound Cleansing - multiple wounds []  - 0 Wound Imaging (photographs - any number of wounds) []  - 0 Wound Tracing (instead of photographs) []  - 0 Simple Wound Measurement - one wound []  - 0 Complex Wound Measurement - multiple wounds INTERVENTIONS - Wound Dressings []  - 0 Small Wound Dressing one or multiple wounds []  - 0 Medium Wound Dressing one or multiple wounds []  - 0 Large Wound Dressing one or multiple wounds []  - 0 Application of Medications - topical []  - 0 Application of Medications - injection INTERVENTIONS - Miscellaneous []  - 0 External ear exam []  - 0 Specimen Collection (cultures, biopsies, blood, body fluids, etc.) []  - 0 Specimen(s) / Culture(s) sent or taken to Lab for analysis []  - 0 Patient Transfer (multiple staff / Civil Service fast streamer / Similar devices) []  - 0 Simple Staple / Suture removal (25 or less) []  - 0 Complex Staple / Suture removal (26 or more) []  - 0 Hypo / Hyperglycemic Management (close monitor of Blood Glucose) X- 1 15 Ankle / Brachial Index (ABI) - do not check if billed separately X- 1 5 Vital Signs Has the patient been seen at the hospital within the last three years: Yes Total Score: 110 Level Of Care: New/Established - Level 3 Electronic Signature(s) Signed: 11/07/2020 4:43:39 PM By: Deon Pilling Entered By: Deon Pilling on 11/07/2020 10:21:24 -------------------------------------------------------------------------------- Encounter Discharge Information Details Patient Name: Date of Service: Adam Burnet Adam R. 11/07/2020 9:00 A M Medical Record Number:  [id number] Patient Account Number: [id number] Date of Birth/Sex: Treating RN: 02-13-1925 (87 y.o. Male) Rhae Hammock Primary Care Magally Vahle: Lona Kettle Other Clinician: Referring Efrata Brunner: Treating Haygen Zebrowski/Extender: Dian Queen in Treatment: 0 Encounter Discharge Information Items Discharge Condition: Stable Ambulatory Status: Ambulatory Discharge Destination: Home Transportation: Private Auto Accompanied By: wife Schedule Follow-up Appointment: Yes Clinical Summary of Care: Patient Declined Electronic Signature(s) Signed: 11/07/2020 5:01:28 PM By: Rhae Hammock RN Entered By: Rhae Hammock on 11/07/2020 10:40:22 -------------------------------------------------------------------------------- Lower Extremity Assessment Details Patient Name: Date of Service: Adam Burnet Adam R. 11/07/2020 9:00 A M Medical Record Number: [id number] Patient Account Number: [id number] Date of Birth/Sex: Treating RN: 25-May-1925 (87 y.o. Male) Lorrin Jackson Primary Care Vicky Schleich: Lona Kettle Other Clinician: Referring Deaven Barron: Treating Nicholes Hibler/Extender: Dian Queen in Treatment: 0 Edema Assessment Assessed: Shirlyn Goltz: Yes] [Right: Yes] Edema: [Left: Yes] [Right: No] Calf Left: Right: Point of Measurement: 31 cm From Medial Instep 35 cm 34 cm Ankle Left: Right: Point of Measurement: 8 cm From Medial Instep 25.5 cm 24 cm Vascular Assessment Pulses: Dorsalis Pedis Palpable: [Left:Yes] [Right:Yes] Notes ABi's bilaterally Non-compressible Electronic Signature(s) Signed: 11/08/2020 6:16:52 PM By: Lorrin Jackson Entered By: Lorrin Jackson on 11/07/2020 09:46:54 -------------------------------------------------------------------------------- Multi-Disciplinary Care Plan Details Patient Name: Date of Service: Adam Burnet Adam R. 11/07/2020 9:00 A M Medical Record Number: [id number] Patient Account Number: [id number] Date of  Birth/Sex: Treating RN: [birth year] (87 y.o. Male) Deon Pilling Primary Care Dickson Kostelnik: Lona Kettle Other Clinician: Referring Lisia Westbay: Treating Zuleika Gallus/Extender: Dian Queen in Treatment: 0 Active Inactive Electronic Signature(s) Signed: 11/07/2020 4:43:39 PM By: Deon Pilling Entered By: Deon Pilling on 11/07/2020 10:17:21 -------------------------------------------------------------------------------- Pain Assessment Details Patient Name: Date of Service: Adam Burnet Adam R. 11/07/2020 9:00 A M Medical Record Number: [id number] Patient Account Number: [id number] Date of Birth/Sex: Treating RN: [birth year] (87 y.o. Male) Lorrin Jackson Primary Care Star Resler: Lona Kettle Other Clinician: Referring Mccall Lomax: Treating Ryder Man/Extender: Dian Queen in Treatment: 0 Active Problems Location of Pain Severity and  Description of Pain Patient Has Paino Yes Site Locations Pain Location: Pain in Ulcers Duration of the Pain. Constant / Intermittento Intermittent Rate the pain. Current Pain Level: 5 Worst Pain Level: 10 Character of Pain Describe the Pain: Burning, Throbbing Pain Management and Medication Current Pain Management: Medication: Yes Cold Application: No Rest: Yes Massage: No Activity: No T.E.N.S.: No Heat Application: No Leg drop or elevation: No Is the Current Pain Management Adequate: Inadequate How does your wound impact your activities of daily livingo Sleep: Yes Bathing: No Appetite: No Relationship With Others: No Bladder Continence: No Emotions: No Bowel Continence: No Work: No Toileting: No Drive: No Dressing: No Hobbies: No Electronic Signature(s) Signed: 11/08/2020 6:16:52 PM By: Lorrin Jackson Entered By: Lorrin Jackson on 11/07/2020 09:23:23 -------------------------------------------------------------------------------- Patient/Caregiver Education Details Patient Name: Date of  Service: Adam Burnet Adam R. 3/24/2022andnbsp9:00 A M Medical Record Number: [id number] Patient Account Number: [id number] Date of Birth/Gender: Treating RN: 06-15-1925 (87 y.o. Male) Deon Pilling Primary Care Physician: Lona Kettle Other Clinician: Referring Physician: Treating Physician/Extender: Dian Queen in Treatment: 0 Education Assessment Education Provided To: Patient Education Topics Provided Welcome T [facility]: o Handouts: Welcome T [facility] o Methods: Explain/Verbal, Printed Responses: Reinforcements needed Electronic Signature(s) Signed: 11/07/2020 4:43:39 PM By: Deon Pilling Entered By: Deon Pilling on 11/07/2020 08:59:15 -------------------------------------------------------------------------------- Vitals Details Patient Name: Date of Service: Adam Burnet Adam R. 11/07/2020 9:00 A M Medical Record Number: [id number] Patient Account Number: [id number] Date of Birth/Sex: Treating RN: Apr 10, 1925 (87 y.o. Male) Lorrin Jackson Primary Care Cornesha Radziewicz: Lona Kettle Other Clinician: Referring Jerusalen Mateja: Treating Asyah Candler/Extender: Dian Queen in Treatment: 0 Vital Signs Time Taken: 08:59 Temperature (F): 97.8 Height (in): 68 Pulse (bpm): 69 Source: Stated Respiratory Rate (breaths/min): 16 Weight (lbs): 155 Blood Pressure (mmHg): 145/73 Source: Stated Reference Range: 80 - 120 mg / dl Body Mass Index (BMI): 23.6 Electronic Signature(s) Signed: 11/08/2020 6:16:52 PM By: Lorrin Jackson Entered By: Lorrin Jackson on 11/07/2020 09:04:26

## 2020-11-12 ENCOUNTER — Telehealth: Payer: Self-pay | Admitting: Neurology

## 2020-11-12 NOTE — Telephone Encounter (Signed)
Pt's wife called wanting to speak to the RN about a lab the pt had. Pt received a bill and they were informed that if the test was coded different they would not be billed. Please advise.

## 2020-11-14 NOTE — Telephone Encounter (Signed)
Pt's wife call back and I spoke with her. She sts she received a bill from medicare stating the hemoglobin a1c was not covered under the dx code chosen by MD. I advised we typically receive a [employer] statement asking for an alternative dx code but have not received as of yet.  I advised once we received this information we could proceed with changing the dx code. I advised the form [employer] send requires a signature.

## 2020-11-19 ENCOUNTER — Encounter: Payer: Self-pay | Admitting: Vascular Surgery

## 2020-11-19 ENCOUNTER — Ambulatory Visit (HOSPITAL_COMMUNITY)
Admission: RE | Admit: 2020-11-19 | Discharge: 2020-11-19 | Disposition: A | Discharge status: Home / Self Care | Payer: Medicare Other | Source: Ambulatory Visit | Attending: Vascular Surgery | Admitting: Vascular Surgery

## 2020-11-19 ENCOUNTER — Other Ambulatory Visit: Payer: Self-pay

## 2020-11-19 ENCOUNTER — Ambulatory Visit (INDEPENDENT_AMBULATORY_CARE_PROVIDER_SITE_OTHER): Payer: Medicare Other | Admitting: Vascular Surgery

## 2020-11-19 VITALS — BP 146/76 | HR 52 | Temp 98.0°F | Resp 16 | Ht 68.0 in | Wt 144.0 lb

## 2020-11-19 DIAGNOSIS — I714 Abdominal aortic aneurysm, without rupture, unspecified: Secondary | ICD-10-CM

## 2020-11-19 NOTE — Progress Notes (Signed)
Patient name: Adam [name] [name]: [id number] DOB: Dec 21, 1924 Sex: male  REASON FOR VISIT: 6 month follow-up for surveillance of surveillance of EVAR in 2012  HPI: Adam [name] is a 87 y.o. male with history of coronary disease status post CABG, atrial fibrillation on DOAC, hypertension, hyperlipidemia, congestive heart failure, previous abdominal aortic aneurysm status post EVAR in 2012 with Dr. Bridgett Larsson.  He was seen in 05/2019 by the nurse practitioner for further surveillance.  His aneurysm was noted to increase from 4.7 to 5.4 cm.  There was no endoleak identified.  He was subsequently sent to see me from our NP.  I initially discussed plans for CTA for further evaluation.  Patient was very hesitant about a CT scan given a very poor experience in the past with IV access while on coumadin.  Ultimately elected 3 month follow-up with another duplex for ongoing surveillance.  He has had multiple duplexes including January 2021, August 2021, December 2021 and today that have all shown no significant growth and stable at 5.4 cm.  He reports no new abdominal or back pain.  He does have chronic back pain.   Past Medical History:  Diagnosis Date  . AAA (abdominal aortic aneurysm) ([place])   . Anginal pain ([place])   . Arthritis   . CAD (coronary artery disease)    Adam [name] IS CARDIO  . CHF (congestive heart failure) ([place])   . Chronic anticoagulation   . Diverticulosis   . GERD (gastroesophageal reflux disease)   . Hyperlipidemia   . Hypertension   . LBP (low back pain)   . Permanent atrial fibrillation ([place])    decision [facility] at [facility]  . Presence of permanent cardiac pacemaker   . S/P CABG (coronary artery bypass graft)   . Shortness of breath dyspnea   . Sleep apnea     Past Surgical History:  Procedure Laterality Date  . CARDIOVERSION  06/30/2012   Procedure: CARDIOVERSION;  Surgeon: Sinclair Grooms, MD;  Location: [facility] ENDOSCOPY;  Service: Cardiovascular;  Laterality: N/A;  .  CARDIOVERSION N/A 11/23/2013   Procedure: CARDIOVERSION;  Surgeon: Sinclair Grooms, MD;  Location: [place];  Service: Cardiovascular;  Laterality: N/A;  . CATARACT EXTRACTION W/ INTRAOCULAR LENS  IMPLANT, BILATERAL    . CORONARY ARTERY BYPASS GRAFT  Feb 1989   "CABG X6"  . ENDOVASCULAR STENT INSERTION  07/28/2011   Procedure: ENDOVASCULAR STENT GRAFT INSERTION;  Surgeon: Hinda Lenis, MD;  Location: [place];  Service: Vascular;  Laterality: N/A;  Pt. on Coumadin/ instructed to hold 5 days prior to procedure  . EYE SURGERY    . INGUINAL HERNIA REPAIR Right 01/04/2015  . INGUINAL HERNIA REPAIR Right 01/04/2015   Procedure:  OPEN REPAIR RIGHT INGUNIAL HERNIA;  Surgeon: Fanny Skates, MD;  Location: [place];  Service: General;  Laterality: Right;  . INSERT / REPLACE / REMOVE PACEMAKER  July 2008  . INSERTION OF MESH Right 01/04/2015   Procedure: INSERTION OF MESH;  Surgeon: Fanny Skates, MD;  Location: [place];  Service: General;  Laterality: Right;  . JOINT REPLACEMENT  1995 & 1996   Bilateral total knee replacements  . LEFT HEART CATHETERIZATION WITH CORONARY ANGIOGRAM N/A 03/28/2014   Procedure: LEFT HEART CATHETERIZATION WITH CORONARY ANGIOGRAM;  Surgeon: Sinclair Grooms, MD;  Location[facility] CATH LAB;  Service: Cardiovascular;  Laterality: N/A;  . PERMANENT PACEMAKER GENERATOR CHANGE N/A 11/03/2013   Procedure: PERMANENT PACEMAKER GENERATOR CHANGE;  Surgeon: Revonda Standard  Caryl Comes, MD;  Location: [facility] CATH LAB;  Service: Cardiovascular;  Laterality: N/A;  . TONSILLECTOMY      Family History  Problem Relation Age of Onset  . Stroke Mother   . Heart failure Mother   . Varicose Veins Mother   . Heart attack Father   . Heart disease Father        Before age 68    Heart Attack age 69 and 40  . Cancer Sister        breast  . Heart disease Sister        After age 73  . Other Sister        DJD    SOCIAL HISTORY: Social History   Tobacco Use  . Smoking status: Former Smoker    Years:  3.00    Types: Cigarettes    Quit date: 08/17/1948    Years since quitting: 72.3  . Smokeless tobacco: Never Used  Substance Use Topics  . Alcohol use: No    Allergies  Allergen Reactions  . Amlodipine Other (See Comments)  . Codeine Other (See Comments)  . Lisinopril     Elevated potassium  . Morphine Nausea Only and Other (See Comments)  . Amiodarone Anxiety and Rash    Current Outpatient Medications  Medication Sig Dispense Refill  . acetaminophen (TYLENOL) 650 MG CR tablet Take 650 mg by mouth every 8 (eight) hours as needed for pain.    [name] apixaban (ELIQUIS) 5 MG TABS tablet Take 5 mg by mouth 2 (two) times daily.    [name] atorvastatin (LIPITOR) 10 MG tablet Take 1 tablet (10 mg total) by mouth every evening. 90 tablet 2  . Cholecalciferol (VITAMIN D) 2000 UNITS CAPS Take 2,000 Units by mouth daily.    . dorzolamide (TRUSOPT) 2 % ophthalmic solution Place 1 drop into the right eye 2 times daily.    . finasteride (PROSCAR) 5 MG tablet Take 5 mg by mouth daily.    . furosemide (LASIX) 80 MG tablet Take by mouth.    . latanoprost (XALATAN) 0.005 % ophthalmic solution Place 1 drop into both eyes at bedtime.    . Multiple Vitamins-Minerals (PRESERVISION AREDS 2+MULTI VIT PO) Take 1 capsule by mouth 2 (two) times daily.     . nitroGLYCERIN (NITROSTAT) 0.4 MG SL tablet PLACE 1 TABLET UNDER THE TONGUE EVERY 5 MINUTES AS NEEDED FOR CHEST PAIN 25 tablet 5  . silodosin (RAPAFLO) 8 MG CAPS capsule Take 8 mg by mouth at bedtime.     [name] VITAMIN D PO Take by mouth.     No current facility-administered medications for this visit.    REVIEW OF SYSTEMS:  [X]  denotes positive finding, [ ]  denotes negative finding Cardiac  Comments:  Chest pain or chest pressure:    Shortness of breath upon exertion:    Short of breath when lying flat:    Irregular heart rhythm:        Vascular    Pain in calf, thigh, or hip brought on by ambulation:    Pain in feet at night that wakes you up from your sleep:      Blood clot in your veins:    Leg swelling:         Pulmonary    Oxygen at home:    Productive cough:     Wheezing:         Neurologic    Sudden weakness in arms or legs:     Sudden numbness in  arms or legs:     Sudden onset of difficulty speaking or slurred speech:    Temporary loss of vision in one eye:     Problems with dizziness:         Gastrointestinal    Blood in stool:     Vomited blood:         Genitourinary    Burning when urinating:     Blood in urine:        Psychiatric    Major depression:         Hematologic    Bleeding problems:    Problems with blood clotting too easily:        Skin    Rashes or ulcers:        Constitutional    Fever or chills:      PHYSICAL EXAM: Vitals:   11/19/20 0948  BP: (!) 146/76  Pulse: (!) 52  Resp: 16  Temp: 98 F (36.7 C)  TempSrc: Temporal  SpO2: 96%  Weight: 144 lb (65.3 kg)  Height: 5\' 8"  (1.727 m)    GENERAL: The patient is a well-nourished male, in no acute distress. The vital signs are documented above. CARDIAC: There is a regular rate and rhythm.  VASCULAR:  2+ palpable femoral pulses bilaterally Left PT 1+ palpable No lower extremity tissue loss Lower extremity venous stasis changes PULMONARY: There is good air exchange bilaterally without wheezing or rales. ABDOMEN: Soft and non-tender with normal pitched bowel sounds.  No pain with deep palpation. MUSCULOSKELETAL: There are no major deformities or cyanosis. NEUROLOGIC: No focal weakness or paresthesias are detected. SKIN: There are no ulcers or rashes noted.   DATA:   EVAR duplex in 2019 showed 4.7 cm aneurysm  EVAR duplex 09/12/19 showed largest aortic measurement being 5.5 cm from 5.4 cm 3 months prior.  EVAR duplex 04/16/20 showed largest aortic measurement 5.44 cm  EVAR duplex 08/13/20 showed largest aortic measurement 5.46 cm  Assessment/Plan:  87 year old male here for ongoing EVAR surveillance given initial concern for enlarging  aneurysm sac.  He was initially referred back to me in 2020 when his aneurysm had increased in size after previous EVAR in 2012 by Dr. Bridgett Larsson.  He was very hesitant about a CT at first and we elected ultrasound surveillance.  As noted above he has had multiple duplexes from January 2021 up until today that have shown very stable aneurysm at 5.4 cm with no endoleak.  He is asymptomatic.  I think we can safely continue to observe this.  I discussed he see me again in 1 year with EVAR duplex here in the office.  Marty Heck, MD [facility] Office: [place]

## 2020-11-20 ENCOUNTER — Encounter (HOSPITAL_BASED_OUTPATIENT_CLINIC_OR_DEPARTMENT_OTHER): Payer: Medicare Other | Admitting: Physician Assistant

## 2020-11-26 ENCOUNTER — Ambulatory Visit: Payer: Medicare Other | Admitting: Neurology

## 2020-11-27 ENCOUNTER — Ambulatory Visit (INDEPENDENT_AMBULATORY_CARE_PROVIDER_SITE_OTHER): Payer: Medicare Other

## 2020-11-27 DIAGNOSIS — I255 Ischemic cardiomyopathy: Secondary | ICD-10-CM

## 2020-11-29 LAB — CUP PACEART REMOTE DEVICE CHECK
Battery Impedance: 1563 Ohm
Battery Remaining Longevity: 49 mo
Battery Voltage: 2.76 V
Brady Statistic RV Percent Paced: 20 %
Date Time Interrogation Session: 20220414110426
Implantable Lead Implant Date: 20080818
Implantable Lead Implant Date: 20080818
Implantable Lead Location: 753859
Implantable Lead Location: 753860
Implantable Lead Model: 4469
Implantable Lead Model: 4470
Implantable Lead Serial Number: 499409
Implantable Lead Serial Number: 602985
Implantable Pulse Generator Implant Date: 20150320
Lead Channel Impedance Value: 454 Ohm
Lead Channel Impedance Value: 67 Ohm
Lead Channel Pacing Threshold Amplitude: 1.375 V
Lead Channel Pacing Threshold Pulse Width: 0.4 ms
Lead Channel Setting Pacing Amplitude: 2.75 V
Lead Channel Setting Pacing Pulse Width: 0.4 ms
Lead Channel Setting Sensing Sensitivity: 5.6 mV

## 2020-12-12 NOTE — Progress Notes (Signed)
Remote pacemaker transmission.   

## 2020-12-18 ENCOUNTER — Telehealth: Payer: Self-pay | Admitting: *Deleted

## 2020-12-18 DIAGNOSIS — G629 Polyneuropathy, unspecified: Secondary | ICD-10-CM | POA: Diagnosis not present

## 2020-12-18 NOTE — Telephone Encounter (Signed)
   [facility] Pre-operative Risk Assessment    Patient Name: Adam [name]  DOB: 27-Apr-1925  [name]: [id number]   HEARTCARE STAFF: - Please ensure there is not already an duplicate clearance open for this procedure. - Under Visit Info/Reason for Call, type in Other and utilize the format Clearance MM/DD/YY or Clearance TBD. Do not use dashes or single digits. - If request is for dental extraction, please clarify the # of teeth to be extracted.  Request for surgical clearance:  1. What type of surgery is being performed? CAUDAL ESI   2. When is this surgery scheduled? 01/07/21   3. What type of clearance is required (medical clearance vs. Pharmacy clearance to hold med vs. Both)? BOTH  4. Are there any medications that need to be held prior to surgery and how long? ELIQUIS x 3 DAYS PRIOR   5. Practice name and name of physician performing surgery? [facility]; MD NOT LISTED   6. What is the office phone number? [phone]   7.   What is the office fax number? [phone]  8.   Anesthesia type (None, local, MAC, general) ? NONE LISTED   Julaine Hua 12/18/2020, 2:10 PM  _________________________________________________________________   (provider comments below)

## 2020-12-18 NOTE — Telephone Encounter (Signed)
Patient with diagnosis of afib on Eliquis for anticoagulation.    Procedure: Caudal ESI Date of procedure: 01/07/21  CHA2DS2-VASc Score = 5  This indicates a 7.2% annual risk of stroke. The patient's score is based upon: CHF History: Yes HTN History: Yes Diabetes History: No Stroke History: No Vascular Disease History: Yes Age Score: 2 Gender Score: 0     CrCl 44.8 ml/min Platelet count 150  Per office protocol, patient can hold Eliquis for 3 days prior to procedure.

## 2020-12-19 NOTE — Telephone Encounter (Signed)
Patient's wife returning call. 

## 2020-12-19 NOTE — Telephone Encounter (Signed)
   Name: Adam [name]  DOB: 1924/08/22  [name]: [id number]   Primary Cardiologist: Sinclair Grooms, MD  Chart reviewed as part of pre-operative protocol coverage. Patient was contacted 12/19/2020 in reference to pre-operative risk assessment for pending surgery as outlined below.  Adam [name] was last seen on 08/15/20 by Dr. [name].  Since that day, Adam [name] has done fine from a cardiac standpoint, however more recently neuropathy in his feet have severely limited in activities. He has no complaints of chest pain or SOB.  Given low risk nature of the procedure, patient would be at acceptable risk for the planned procedure without further cardiovascular testing.   The patient was advised that if he develops new symptoms prior to surgery to contact our office to arrange for a follow-up visit, and he verbalized understanding.  Per pharmacy recommendations, patient can hold eliquis 3 days prior to his upcoming surgery with plans to restart as soon as he is cleared to do so by his surgeon.  I will route this recommendation to the requesting party via Epic fax function and remove from pre-op pool. Please call with questions.  Abigail Butts, PA-C 12/19/2020, 1:12 PM

## 2020-12-19 NOTE — Telephone Encounter (Signed)
   Name: Adam [name]  DOB: 12/02/1924  [name]: [id number]   Primary Cardiologist: Sinclair Grooms, MD  Chart reviewed as part of pre-operative protocol coverage.   Left a voicemail for patient to call back for ongoing preop assessment.   Abigail Butts, PA-C 12/19/2020, 12:00 PM

## 2020-12-20 DIAGNOSIS — M109 Gout, unspecified: Secondary | ICD-10-CM | POA: Diagnosis not present

## 2021-01-02 ENCOUNTER — Ambulatory Visit: Payer: Medicare Other | Admitting: Podiatry

## 2021-01-02 DIAGNOSIS — M79671 Pain in right foot: Secondary | ICD-10-CM | POA: Diagnosis not present

## 2021-01-02 DIAGNOSIS — M109 Gout, unspecified: Secondary | ICD-10-CM | POA: Diagnosis not present

## 2021-01-07 ENCOUNTER — Telehealth: Payer: Self-pay | Admitting: Neurology

## 2021-01-07 DIAGNOSIS — M5416 Radiculopathy, lumbar region: Secondary | ICD-10-CM | POA: Diagnosis not present

## 2021-01-07 NOTE — Telephone Encounter (Signed)
I called pt. No answer, left a message asking pt to call me back.   

## 2021-01-07 NOTE — Telephone Encounter (Signed)
Pt's wife called needing to speak to the RN regarding the lab work that the pt had. Pt continues to receive a bill from [place] and she has spoken to [facility] and she was informed that this has to do with the way the lab was coded. Please call back when available to discuss.

## 2021-01-08 DIAGNOSIS — R351 Nocturia: Secondary | ICD-10-CM | POA: Diagnosis not present

## 2021-01-08 DIAGNOSIS — N401 Enlarged prostate with lower urinary tract symptoms: Secondary | ICD-10-CM | POA: Diagnosis not present

## 2021-01-08 NOTE — Telephone Encounter (Signed)
I called the pt's wife she reports she is still getting a bill from [facility] about a lab not being cover that Dr. Rexene Alberts ordered. I tried to call and after holding for several minutes had to disconnect.  Will try again later the CB # [phone].

## 2021-01-08 NOTE — Telephone Encounter (Signed)
Pt's wife returned call. She states she will be home until 10am then she will be heading out for an appt she has. Please advise.

## 2021-01-14 ENCOUNTER — Other Ambulatory Visit: Payer: Self-pay

## 2021-01-14 ENCOUNTER — Ambulatory Visit (INDEPENDENT_AMBULATORY_CARE_PROVIDER_SITE_OTHER): Payer: Medicare Other | Admitting: Internal Medicine

## 2021-01-14 ENCOUNTER — Encounter: Payer: Self-pay | Admitting: Internal Medicine

## 2021-01-14 VITALS — BP 149/70 | HR 45 | Ht 68.0 in | Wt 146.8 lb

## 2021-01-14 DIAGNOSIS — I5022 Chronic systolic (congestive) heart failure: Secondary | ICD-10-CM

## 2021-01-14 DIAGNOSIS — I482 Chronic atrial fibrillation, unspecified: Secondary | ICD-10-CM

## 2021-01-14 DIAGNOSIS — Z950 Presence of cardiac pacemaker: Secondary | ICD-10-CM

## 2021-01-14 DIAGNOSIS — I255 Ischemic cardiomyopathy: Secondary | ICD-10-CM

## 2021-01-14 NOTE — Progress Notes (Signed)
Patient ID: Adam [name], male   DOB: July 18, 1925, 87 y.o.   [name]: [id number]       Patient Care Team: [name], MD as PCP - General (Family Medicine) [name], MD as PCP - Cardiology (Cardiology) [name], MD as Consulting Physician (Cardiology)   HPI  Adam [name] is a 87 y.o. 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  Anticoagulated with Apixoban    Ischemic cardiomyopathy with prior bypass surgery and HFpEF  Previous pt of [name] and now sees [name] for cardiology followup         The patient denies chest pain, nocturnal dyspnea, orthopnea   There have been no palpitations, lightheadedness or syncope.  Complains of dyspnea on exertion which has been stable.  Effort is largely limited by new onset neuropathy.  He also has stable chronic peripheral edema.  .  Recently stopped exercising as much secondary to gout in LE and Neuropathy  pain   Eliquis 5mg   Po tolerated well- No bleedings     DATE TEST EF   2015 Echo  25%   8/15 LHC EF 25%  LIMA/SVG-D1,RCA -patent SVG-OM-T  8/17 Echo   35 %         Date Cr K Hgb  6/18 0.94 3.9 12.7  8/19  0.97 4.8 13.0  9/20 1.06 4.1 12.1  1/21 1.00 3.9 12.7      Past Medical History:  Diagnosis Date  . AAA (abdominal aortic aneurysm) ([place])   . Anginal pain ([place])   . Arthritis   . CAD (coronary artery disease)    Adam [name] IS CARDIO  . CHF (congestive heart failure) ([place])   . Chronic anticoagulation   . Diverticulosis   . GERD (gastroesophageal reflux disease)   . Hyperlipidemia   . Hypertension   . LBP (low back pain)   . Permanent atrial fibrillation ([place])    decision made at [facility]  . Presence of permanent cardiac pacemaker   . S/P CABG (coronary artery bypass graft)   . Shortness of breath dyspnea   . Sleep apnea     Past Surgical History:  Procedure Laterality Date  .  CARDIOVERSION  06/30/2012   Procedure: CARDIOVERSION;  Surgeon: Sinclair Grooms, MD;  Location: [facility] ENDOSCOPY;  Service: Cardiovascular;  Laterality: N/A;  . CARDIOVERSION N/A 11/23/2013   Procedure: CARDIOVERSION;  Surgeon: Sinclair Grooms, MD;  Location: [place];  Service: Cardiovascular;  Laterality: N/A;  . CATARACT EXTRACTION W/ INTRAOCULAR LENS  IMPLANT, BILATERAL    . CORONARY ARTERY BYPASS GRAFT  Feb 1989   "CABG X6"  . ENDOVASCULAR STENT INSERTION  07/28/2011   Procedure: ENDOVASCULAR STENT GRAFT INSERTION;  Surgeon: Hinda Lenis, MD;  Location: [place];  Service: Vascular;  Laterality: N/A;  Pt. on Coumadin/ instructed to hold 5 days prior to procedure  . EYE SURGERY    . INGUINAL HERNIA REPAIR Right 01/04/2015  . INGUINAL HERNIA REPAIR Right 01/04/2015   Procedure:  OPEN REPAIR RIGHT INGUNIAL HERNIA;  Surgeon: Fanny Skates, MD;  Location: [place];  Service: General;  Laterality: Right;  . INSERT / REPLACE / REMOVE PACEMAKER  July 2008  . INSERTION OF MESH Right 01/04/2015   Procedure: INSERTION OF MESH;  Surgeon: Fanny Skates, MD;  Location: [place];  Service: General;  Laterality: Right;  . [place]  1996   Bilateral total knee replacements  . LEFT HEART CATHETERIZATION WITH CORONARY ANGIOGRAM N/A 03/28/2014   Procedure: LEFT HEART CATHETERIZATION WITH CORONARY ANGIOGRAM;  Surgeon: Sinclair Grooms, MD;  Location: [facility] CATH LAB;  Service: Cardiovascular;  Laterality: N/A;  . PERMANENT PACEMAKER GENERATOR CHANGE N/A 11/03/2013   Procedure: PERMANENT PACEMAKER GENERATOR CHANGE;  Surgeon: [name], MD;  Location: [facility] CATH LAB;  Service: Cardiovascular;  Laterality: N/A;  . TONSILLECTOMY      Current Outpatient Medications  Medication Sig Dispense Refill  . acetaminophen (TYLENOL) 650 MG CR tablet Take 650 mg by mouth every 8 (eight) hours as needed for pain.    [name] apixaban (ELIQUIS) 5 MG TABS tablet Take 5 mg by mouth 2 (two) times daily.    [name]  atorvastatin (LIPITOR) 10 MG tablet Take 1 tablet (10 mg total) by mouth every evening. 90 tablet 2  . Cholecalciferol (VITAMIN D) 2000 UNITS CAPS Take 2,000 Units by mouth daily.    . dorzolamide (TRUSOPT) 2 % ophthalmic solution Place 1 drop into the right eye 2 times daily.    . finasteride (PROSCAR) 5 MG tablet Take 5 mg by mouth daily.    . furosemide (LASIX) 80 MG tablet Take by mouth.    . latanoprost (XALATAN) 0.005 % ophthalmic solution Place 1 drop into both eyes at bedtime.    . Multiple Vitamins-Minerals (PRESERVISION AREDS 2+MULTI VIT PO) Take 1 capsule by mouth 2 (two) times daily.     . nitroGLYCERIN (NITROSTAT) 0.4 MG SL tablet PLACE 1 TABLET UNDER THE TONGUE EVERY 5 MINUTES AS NEEDED FOR CHEST PAIN 25 tablet 5  . silodosin (RAPAFLO) 8 MG CAPS capsule Take 8 mg by mouth at bedtime.     [name] VITAMIN D PO Take by mouth.     No current facility-administered medications for this visit.    Allergies  Allergen Reactions  . Amlodipine Other (See Comments)  . Codeine Other (See Comments)  . Lisinopril     Elevated potassium  . Morphine Nausea Only and Other (See Comments)  . Amiodarone Anxiety and Rash    Review of Systems negative except from HPI and PMH  Physical Exam: BP (!) 149/70   Pulse (!) 45   Ht 5\' 8"  (1.727 m)   Wt 146 lb 12.8 oz (66.6 kg)   SpO2 96%   BMI 22.32 kg/m  Well developed and well nourished in no acute distress HENT normal Neck supple with JVP-flat Clear Device pocket well healed; without hematoma or erythema.  There is no tethering  Irregularly irregular rate and rhythm without * murmur Abd-soft with active BS No Clubbing cyanosis 2-3+ edema Skin-warm and dry A & Oriented  Grossly normal sensory and motor function  ECG atrial fibrillation 45 Intervals-/09/48    Assessment and  Plan   Atrial fibrillation-permanent  Bradycardia  Ischemic cardiomyopathy  HFrEF   Pacemaker-[employer]  The patient's device was interrogated.  The  information was reviewed. No changes were made in the programming.     Leg wound   No symptoms of angina.  We will continue him on his statin. I will reach out to Dr. [name] concerning augmented therapies for his cardiomyopathy including SGLT2.  Not a candidate for ACE/ARB secondary to elevated potassium.  Tolerating his Eliquis at 5 mg twice daily.  Only superficial bleeding.  We will continue    I,Stephanie Williams,acting as a scribe for Virl Axe, MD.,have documented all relevant documentation on the behalf of Adam Lipps  Caryl Comes, MD,as directed by  Virl Axe, MD while in the presence of Virl Axe, MD.  I, Virl Axe, MD, have reviewed all documentation for this visit. The documentation on 01/14/21 for the exam, diagnosis, procedures, and orders are all accurate and complete.

## 2021-01-14 NOTE — Patient Instructions (Signed)

## 2021-01-15 NOTE — Telephone Encounter (Addendum)
I attempted to call [facility] again this morning. After holding for 10 + mins I d/c the call. I have sent over a clarification order form to [facility] including a alternative dx code for the [facility] blood test. I am not sure what else to do at this point.  I have faxed the clarification form to [phone], confirmation has been received.

## 2021-01-16 ENCOUNTER — Encounter: Payer: Self-pay | Admitting: Podiatry

## 2021-01-16 ENCOUNTER — Other Ambulatory Visit: Payer: Self-pay

## 2021-01-16 ENCOUNTER — Ambulatory Visit (INDEPENDENT_AMBULATORY_CARE_PROVIDER_SITE_OTHER): Payer: Medicare Other | Admitting: Podiatry

## 2021-01-16 DIAGNOSIS — M79676 Pain in unspecified toe(s): Secondary | ICD-10-CM | POA: Diagnosis not present

## 2021-01-16 DIAGNOSIS — B351 Tinea unguium: Secondary | ICD-10-CM

## 2021-01-16 DIAGNOSIS — D689 Coagulation defect, unspecified: Secondary | ICD-10-CM

## 2021-01-16 DIAGNOSIS — D2371 Other benign neoplasm of skin of right lower limb, including hip: Secondary | ICD-10-CM | POA: Diagnosis not present

## 2021-01-16 DIAGNOSIS — D2372 Other benign neoplasm of skin of left lower limb, including hip: Secondary | ICD-10-CM

## 2021-01-18 DIAGNOSIS — Z23 Encounter for immunization: Secondary | ICD-10-CM | POA: Diagnosis not present

## 2021-01-18 NOTE — Progress Notes (Signed)
He presents today chief complaint of painful elongated toenails and painful lesions plantar aspect of the bilateral foot.  Objective: Vital signs stable oriented x3 pulses are palpable.  Toenails are thick yellow dystrophic clinically mycotic painful palpation as well as debridement.  He also has multiple reactive hyperkeratotic lesions.  Assessment: Benign skin lesions plantar aspect of the bilateral foot pain in limb secondary to onychomycosis.  Plan: Debridement of benign skin lesions and debridement of nails 1 through 5 bilateral bilaterally covered service.

## 2021-01-28 NOTE — Telephone Encounter (Signed)
Pt's wife called again stating the fax with the additional diagnosis code was not received by [facility] and another bill was received.  I called [facility] and spoke directly to [facility] B and gaver verbal dx code to for the Hemoglobin A1c order. Alternative dx code was [id number]. Jasmine advised information sent to [facility] for processing.

## 2021-01-31 ENCOUNTER — Ambulatory Visit (HOSPITAL_COMMUNITY): Payer: Medicare Other

## 2021-01-31 ENCOUNTER — Encounter (HOSPITAL_COMMUNITY): Payer: Self-pay

## 2021-01-31 ENCOUNTER — Other Ambulatory Visit: Payer: Self-pay

## 2021-01-31 ENCOUNTER — Ambulatory Visit (HOSPITAL_COMMUNITY)
Admission: EM | Admit: 2021-01-31 | Discharge: 2021-01-31 | Disposition: A | Discharge status: Home / Self Care | Payer: Medicare Other

## 2021-01-31 DIAGNOSIS — M79661 Pain in right lower leg: Secondary | ICD-10-CM

## 2021-01-31 DIAGNOSIS — R600 Localized edema: Secondary | ICD-10-CM | POA: Diagnosis not present

## 2021-01-31 DIAGNOSIS — M79605 Pain in left leg: Secondary | ICD-10-CM | POA: Diagnosis not present

## 2021-01-31 DIAGNOSIS — M79604 Pain in right leg: Secondary | ICD-10-CM

## 2021-01-31 DIAGNOSIS — Z7901 Long term (current) use of anticoagulants: Secondary | ICD-10-CM | POA: Diagnosis not present

## 2021-01-31 DIAGNOSIS — Z8679 Personal history of other diseases of the circulatory system: Secondary | ICD-10-CM

## 2021-01-31 NOTE — Discharge Instructions (Addendum)
Wrapped both lower legs with ace wraps to help with compression. Recommend call your Cardiologist today to schedule appointment for further evaluation. Continue to monitor symptoms. If pain gets worse or any redness, warmth or increased swelling occurs, or any difficulty breathing occurs, call 911 and go to the ER ASAP. Otherwise, follow-up with your Cardiologist as planned.

## 2021-01-31 NOTE — ED Provider Notes (Signed)
[place]    CSN: [id number] Arrival date & time: 01/31/21  1242      History   Chief Complaint Chief Complaint  Patient presents with   Leg Swelling    HPI Adam [name] is a 87 y.o. male.   87 year old gentleman accompanied by his Wife with concern over swelling of his lower legs, especially left more than right. Lower extremity swelling has been ongoing for many months but has noticed more swelling over the past month. Patient has thin skin due to anticoagulant use and his age so he bruises and bleeds easily. He had a superficial blood vessel burst in his right outer lower leg that his wife is concerned over infection. Has no open sores/wounds. Red leg slightly more painful. Denies any fever, chills, difficulty breathing, increase in shortness of breath, chest pain, nausea or vomiting. Patient does have a Cardiologist whom he saw about 3 weeks ago. No recent lab work through [facility] but had some recent labs done through [facility] about 2 weeks ago. Sodium and Potassium were normal, BUN slightly elevated and normal GFR, LFT's, and protein level. Low RBC's, HGB, HCT-  demonstrating anemia. Multiple other chronic health issues include CHF, Cardiomyopathy, CAD, A.fib with Pacemaker with chronic anticoagulation, HTN, hyperlipidemia, AAA repair, GERD, Gout, Diverticulosis, sleep apnea, BPH, lumbar disc disease, and arthritis. Currently on Proscar, Eliquis, Lipitor, Allopurinol, Lyrica, Rapaflo, eye drops, FE and various supplements daily and Tylenol, Nitroglycerin prin. He is also on 80mg  of Lasix daily.   The history is provided by the patient and the spouse.   Past Medical History:  Diagnosis Date   AAA (abdominal aortic aneurysm) (HCC)    Anginal pain ([place])    Arthritis    CAD (coronary artery disease)    [name]   CHF (congestive heart failure) (HCC)    Chronic anticoagulation    Diverticulosis    GERD (gastroesophageal reflux disease)     Hyperlipidemia    Hypertension    LBP (low back pain)    Permanent atrial fibrillation ([place])    decision made at [facility] 2015   Presence of permanent cardiac pacemaker    S/P CABG (coronary artery bypass graft)    Shortness of breath dyspnea    Sleep apnea     Patient Active Problem List   Diagnosis Date Noted   Ischemic cardiomyopathy 11/27/2019   Educated about COVID-19 virus infection 11/28/2018   Lumbar spondylosis 05/12/2018   Spinal stenosis of lumbar region 05/12/2018   Degeneration of lumbar intervertebral disc 01/13/2018   Low back pain 12/28/2017   PVD (posterior vitreous detachment), bilateral 05/27/2017   Mass of both adrenal glands ([place]) 12/25/2016   Right inguinal hernia 12/03/2014   Benign non-nodular prostatic hyperplasia with lower urinary tract symptoms 08/13/2014   Sleep apnea 18/84/1660   Chronic systolic heart failure ([place]) 02/21/2014   Dysfunctional voiding of urine 02/12/2014   Long term current use of anticoagulant therapy 11/21/2013   Encounter for therapeutic drug monitoring 10/06/2013   Dermatochalasis 07/26/2012   Dysrhythmias 03/31/2012   Myocardial infarction ([place]) 03/31/2012   Hyperlipidemia 03/14/2012   Benign essential hypertension 03/10/2012   CAD (coronary artery disease) 03/10/2012   Diverticulosis 03/10/2012   GERD (gastroesophageal reflux disease) 03/10/2012   S/P AAA (abdominal aortic aneurysm) repair 12/11/2011   Intermediate stage nonexudative age-related macular degeneration of both eyes 12/09/2011   Ocular hypertension, bilateral 12/08/2011   Posterior vitreous detachment 12/08/2011   Pseudophakia 12/08/2011  Retinal drusen 12/08/2011   AAA (abdominal aortic aneurysm) without rupture (HCC) 09/04/2011   HYPERTENSION, HEART CONTROLLED W/O ASSOC CHF 10/08/2010   CORONARY ATHEROSCLEROSIS, ARTERY BYPASS GRAFT 10/08/2010   Chronic atrial fibrillation ([place]) 10/08/2010   PACEMAKER, PERMANENT-[employer] 10/08/2010    Past Surgical  History:  Procedure Laterality Date   CARDIOVERSION  06/30/2012   Procedure: CARDIOVERSION;  Surgeon: Sinclair Grooms, MD;  Location: [facility];  Service: Cardiovascular;  Laterality: N/A;   CARDIOVERSION N/A 11/23/2013   Procedure: CARDIOVERSION;  Surgeon: Sinclair Grooms, MD;  Location: [facility] ENDOSCOPY;  Service: Cardiovascular;  Laterality: N/A;   CATARACT EXTRACTION W/ INTRAOCULAR LENS  IMPLANT, BILATERAL     CORONARY ARTERY BYPASS GRAFT  Feb 1989   "CABG X6"   ENDOVASCULAR STENT INSERTION  07/28/2011   Procedure: ENDOVASCULAR STENT GRAFT INSERTION;  Surgeon: Hinda Lenis, MD;  Location: [place];  Service: Vascular;  Laterality: N/A;  Pt. on Coumadin/ instructed to hold 5 days prior to procedure   [place] Right 01/04/2015   INGUINAL HERNIA REPAIR Right 01/04/2015   Procedure:  [place];  Surgeon: Fanny Skates, MD;  Location: [place];  Service: General;  Laterality: Right;   INSERT / REPLACE / REMOVE PACEMAKER  July 2008   INSERTION OF MESH Right 01/04/2015   Procedure: INSERTION OF MESH;  Surgeon: Fanny Skates, MD;  Location: [place];  Service: General;  Laterality: Right;   [place]   Bilateral total knee replacements   LEFT HEART CATHETERIZATION WITH CORONARY ANGIOGRAM N/A 03/28/2014   Procedure: LEFT HEART CATHETERIZATION WITH CORONARY ANGIOGRAM;  Surgeon: Sinclair Grooms, MD;  Location: [facility] CATH LAB;  Service: Cardiovascular;  Laterality: N/A;   PERMANENT PACEMAKER GENERATOR CHANGE N/A 11/03/2013   Procedure: PERMANENT PACEMAKER GENERATOR CHANGE;  Surgeon: [name], MD;  Location: [facility] CATH LAB;  Service: Cardiovascular;  Laterality: N/A;   TONSILLECTOMY         Home Medications    Prior to Admission medications   Medication Sig Start Date End Date Taking? Authorizing Provider  acetaminophen (TYLENOL) 650 MG CR tablet Take 650 mg by mouth every 8 (eight) hours as needed for pain.   Yes  [provider]  allopurinol (ZYLOPRIM) 100 MG tablet Take 100 mg by mouth daily.   Yes [provider]  apixaban (ELIQUIS) 5 MG TABS tablet Take 5 mg by mouth 2 (two) times daily.   Yes [provider]  atorvastatin (LIPITOR) 10 MG tablet Take 1 tablet (10 mg total) by mouth every evening. 10/18/17  Yes [name], MD  Cholecalciferol (VITAMIN D) 2000 UNITS CAPS Take 2,000 Units by mouth daily.   Yes [provider]  dorzolamide (TRUSOPT) 2 % ophthalmic solution Place 1 drop into the right eye 2 times daily. 03/13/20  Yes [provider]  Ferrous Sulfate (IRON PO) Take by mouth.   Yes [provider]  finasteride (PROSCAR) 5 MG tablet Take 5 mg by mouth daily. 06/26/19  Yes [provider]  furosemide (LASIX) 80 MG tablet Take by mouth. 07/15/20  Yes [provider]  latanoprost (XALATAN) 0.005 % ophthalmic solution Place 1 drop into both eyes at bedtime.   Yes [provider]  Multiple Vitamins-Minerals (PRESERVISION AREDS 2+MULTI VIT PO) Take 1 capsule by mouth 2 (two) times daily.    Yes [provider]  nitroGLYCERIN (NITROSTAT) 0.4 MG SL tablet  PLACE 1 TABLET UNDER THE TONGUE EVERY 5 MINUTES AS NEEDED FOR CHEST PAIN 06/27/20  Yes [name], MD  Pregabalin (LYRICA PO) Take by mouth as needed.   Yes [provider]  silodosin (RAPAFLO) 8 MG CAPS capsule Take 8 mg by mouth at bedtime.  04/17/14  Yes [provider]  VITAMIN E PO Take by mouth.   Yes [provider]  VITAMIN D PO Take by mouth.    [provider]    Family History Family History  Problem Relation Age of Onset   Stroke Mother    Heart failure Mother    Varicose Veins Mother    Heart attack Father    Heart disease Father        Before age 39    Heart Attack age 8 and 99   Cancer Sister        breast   Heart disease Sister        After age 97   Other Sister        DJD    Social  History Social History   Tobacco Use   Smoking status: Former    Years: 3.00    Pack years: 0.00    Types: Cigarettes    Quit date: 08/17/1948    Years since quitting: 72.5   Smokeless tobacco: Never  Vaping Use   Vaping Use: Never used  Substance Use Topics   Alcohol use: No   Drug use: No     Allergies   Amlodipine, Codeine, Lisinopril, Morphine, and Amiodarone   Review of Systems Review of Systems  Constitutional:  Positive for fatigue. Negative for activity change, appetite change, chills, diaphoresis and fever.  HENT:  Negative for facial swelling, sore throat and trouble swallowing.   Respiratory:  Positive for shortness of breath (but no increase from baseline). Negative for cough and chest tightness.   Cardiovascular:  Positive for leg swelling. Negative for chest pain and palpitations.  Gastrointestinal:  Negative for nausea and vomiting.  Musculoskeletal:  Positive for arthralgias and gait problem. Negative for joint swelling.  Skin:  Positive for color change. Negative for rash and wound.  Allergic/Immunologic: Negative for environmental allergies and food allergies.  Neurological:  Positive for weakness and numbness. Negative for dizziness, tremors, seizures, syncope, facial asymmetry and speech difficulty.  Hematological:  Negative for adenopathy. Bruises/bleeds easily.  Psychiatric/Behavioral:  Positive for sleep disturbance.     Physical Exam Triage Vital Signs ED Triage Vitals  Enc Vitals Group     BP 01/31/21 1313 126/63     Pulse Rate 01/31/21 1313 (!) 50     Resp 01/31/21 1313 18     Temp 01/31/21 1313 98.2 F (36.8 C)     Temp src --      SpO2 01/31/21 1313 100 %     Weight --      Height --      Head Circumference --      Peak Flow --      Pain Score 01/31/21 1305 5     Pain Loc --      Pain Edu? --      Excl. in [place]? --    No data found.  Updated Vital Signs BP 126/63   Pulse (!) 50 Comment: per wife pt heart rate normally in 40s/50s.   Temp 98.2 F (36.8 C)   Resp 18   SpO2 100%   Visual Acuity Right Eye Distance:   Left Eye  Distance:   Bilateral Distance:    Right Eye Near:   Left Eye Near:    Bilateral Near:     Physical Exam Vitals and nursing note reviewed.  Constitutional:      General: He is awake. He is not in acute distress.    Appearance: He is well-developed and well-groomed.     Comments: He is sitting comfortably in the exam chair in no acute distress.   HENT:     Head: Normocephalic and atraumatic.     Right Ear: Decreased hearing noted.     Left Ear: Decreased hearing noted.  Eyes:     Extraocular Movements: Extraocular movements intact.     Conjunctiva/sclera: Conjunctivae normal.  Cardiovascular:     Rate and Rhythm: Regular rhythm. Bradycardia present.     Heart sounds: Normal heart sounds.  Pulmonary:     Effort: No tachypnea, accessory muscle usage, respiratory distress or retractions.     Breath sounds: No stridor or decreased air movement. Examination of the right-lower field reveals rales. Examination of the left-lower field reveals rales. Rales present. No decreased breath sounds, wheezing or rhonchi.  Musculoskeletal:     Cervical back: Normal range of motion.     Right lower leg: Tenderness (slight) present. 3+ Pitting Edema present.     Left lower leg: Tenderness (slight) present. 2+ Pitting Edema present.     Right ankle: Swelling present. No ecchymosis. Normal range of motion. Normal pulse.     Left ankle: Swelling present. No ecchymosis. Normal range of motion. Normal pulse.     Right foot: Normal capillary refill. Swelling present. No tenderness.     Left foot: Normal capillary refill. Swelling present. No tenderness.       Legs:     Comments: Swelling of both lower legs from knee to upper foot area bilaterally with right more significant than left. 3+ pitting edema on right with 2+ on left. Slightly tender. Skin with some red to purple discoloration, especially more distal.  Multiple areas of ecchymosis with 1cm oval area on right mid lateral aspect of leg with clotted blood present. No surrounding erythema. Not open. No drainage. No warmth or firmness. A few other areas of scabbed over lesions but no secondary infection present. Skin very dry and thin. Good distal pulses. Slight decrease in sensation of feet.   Skin:    General: Skin is warm and dry.     Capillary Refill: Capillary refill takes less than 2 seconds.     Coloration: Skin is pale. Skin is not cyanotic or jaundiced.     Findings: Bruising, ecchymosis and lesion (mostly 'blood blisters') present. No abrasion, abscess, erythema, petechiae or wound.     Comments: Main bruising and ecchymosis mainly around knees.   Neurological:     General: No focal deficit present.     Mental Status: He is alert and oriented to person, place, and time.     Sensory: Sensory deficit present.     Motor: Motor function is intact.  Psychiatric:        Attention and Perception: Attention normal.        Mood and Affect: Mood and affect normal.        Speech: Speech normal.        Behavior: Behavior normal. Behavior is cooperative.        Thought Content: Thought content normal.        Cognition and Memory: Cognition normal.  Judgment: Judgment normal.     UC Treatments / Results  Labs (all labs ordered are listed, but only abnormal results are displayed) Labs Reviewed - No data to display  EKG   Radiology No results found.  Procedures Procedures (including critical care time)  Medications Ordered in UC Medications - No data to display  Initial Impression / Assessment and Plan / UC Course  I have reviewed the triage vital signs and the nursing notes.  Pertinent labs & imaging results that were available during my care of the patient were reviewed by me and considered in my medical decision making (see chart for details).    Reviewed with patient and wife that swelling is due to multifactorial  issues related to chronic CHF, neuropathy, anemia and dependent edema. Patient unable to get Ted-hose on any longer but tries to elevate legs as much as possible. Both wife and patient have multiple questions regarding CHF, labwork, peripheral edema and skin care. Discussed that he appears stable and in no acute distress. BP is normal with normal temp and good oxygenation level. Pulse is bradycardic but normal for him. He does not have any increase in shortness of breath but rales heard in lower lobes bilaterally on lung exam. Discussed that he is on a higher dose of Lasix and uncertain if changing or adding any additional diuretic will have on peripheral edema. Will defer to Cardiologist for management. Discussed that dried blood and "blood blisters' on legs are not infection and he does not appear to have any open ulcers or wounds (and no discharge) currently on his legs. No increase in warmth or redness or firmness which would indicate infection. Wrapped both legs in large ace wraps for compression and support. Continue to elevate legs as much as possible. Continue to monitor symptoms and swelling. Patient should call his Cardiologist today to schedule appointment for further evaluation. If pain gets worse or any redness, warmth or increased swelling occurs, call 911 and go to the ER ASAP. Otherwise, follow-up with the Cardiologist as planned.  Final Clinical Impressions(s) / UC Diagnoses   Final diagnoses:  Bilateral lower extremity edema  History of chronic CHF  Pain in right lower leg  Long term current use of anticoagulant therapy     Discharge Instructions      Wrapped both lower legs with ace wraps to help with compression. Recommend call your Cardiologist today to schedule appointment for further evaluation. Continue to monitor symptoms. If pain gets worse or any redness, warmth or increased swelling occurs, or any difficulty breathing occurs, call 911 and go to the ER ASAP. Otherwise,  follow-up with your Cardiologist as planned.      ED Prescriptions   None    PDMP not reviewed this encounter.   Katy Apo, NP 02/01/21 1459

## 2021-01-31 NOTE — ED Triage Notes (Signed)
Pt with bilateral lower leg swelling, worse on right. Swelling has been ongoing for several months but worsening in the past month. Wife reports pt had a blood vessel burst several days ago on right lower leg and that pt bleeds easily due to being on eliquis.

## 2021-02-01 ENCOUNTER — Ambulatory Visit (HOSPITAL_COMMUNITY): Payer: Medicare Other

## 2021-02-12 DIAGNOSIS — S61502A Unspecified open wound of left wrist, initial encounter: Secondary | ICD-10-CM | POA: Diagnosis not present

## 2021-02-12 DIAGNOSIS — W010XXA Fall on same level from slipping, tripping and stumbling without subsequent striking against object, initial encounter: Secondary | ICD-10-CM | POA: Diagnosis not present

## 2021-02-12 DIAGNOSIS — S81812A Laceration without foreign body, left lower leg, initial encounter: Secondary | ICD-10-CM | POA: Diagnosis not present

## 2021-02-14 DIAGNOSIS — S81812D Laceration without foreign body, left lower leg, subsequent encounter: Secondary | ICD-10-CM | POA: Diagnosis not present

## 2021-02-24 DIAGNOSIS — M5136 Other intervertebral disc degeneration, lumbar region: Secondary | ICD-10-CM | POA: Diagnosis not present

## 2021-02-26 ENCOUNTER — Ambulatory Visit (INDEPENDENT_AMBULATORY_CARE_PROVIDER_SITE_OTHER): Payer: Medicare Other

## 2021-02-26 DIAGNOSIS — I255 Ischemic cardiomyopathy: Secondary | ICD-10-CM | POA: Diagnosis not present

## 2021-02-27 LAB — CUP PACEART REMOTE DEVICE CHECK
Battery Impedance: 1737 Ohm
Battery Remaining Longevity: 45 mo
Battery Voltage: 2.75 V
Brady Statistic RV Percent Paced: 18 %
Date Time Interrogation Session: 20220714093358
Implantable Lead Implant Date: 20080818
Implantable Lead Implant Date: 20080818
Implantable Lead Location: 753859
Implantable Lead Location: 753860
Implantable Lead Model: 4469
Implantable Lead Model: 4470
Implantable Lead Serial Number: 499409
Implantable Lead Serial Number: 602985
Implantable Pulse Generator Implant Date: 20150320
Lead Channel Impedance Value: 443 Ohm
Lead Channel Impedance Value: 67 Ohm
Lead Channel Pacing Threshold Amplitude: 1.375 V
Lead Channel Pacing Threshold Pulse Width: 0.4 ms
Lead Channel Setting Pacing Amplitude: 2.75 V
Lead Channel Setting Pacing Pulse Width: 0.4 ms
Lead Channel Setting Sensing Sensitivity: 5.6 mV

## 2021-03-05 DIAGNOSIS — I10 Essential (primary) hypertension: Secondary | ICD-10-CM | POA: Diagnosis not present

## 2021-03-05 DIAGNOSIS — E785 Hyperlipidemia, unspecified: Secondary | ICD-10-CM | POA: Diagnosis not present

## 2021-03-05 DIAGNOSIS — M109 Gout, unspecified: Secondary | ICD-10-CM | POA: Diagnosis not present

## 2021-03-05 DIAGNOSIS — E039 Hypothyroidism, unspecified: Secondary | ICD-10-CM | POA: Diagnosis not present

## 2021-03-12 DIAGNOSIS — H401132 Primary open-angle glaucoma, bilateral, moderate stage: Secondary | ICD-10-CM | POA: Diagnosis not present

## 2021-03-17 DIAGNOSIS — Z85828 Personal history of other malignant neoplasm of skin: Secondary | ICD-10-CM | POA: Diagnosis not present

## 2021-03-17 DIAGNOSIS — L905 Scar conditions and fibrosis of skin: Secondary | ICD-10-CM | POA: Diagnosis not present

## 2021-03-17 DIAGNOSIS — D1801 Hemangioma of skin and subcutaneous tissue: Secondary | ICD-10-CM | POA: Diagnosis not present

## 2021-03-17 DIAGNOSIS — L570 Actinic keratosis: Secondary | ICD-10-CM | POA: Diagnosis not present

## 2021-03-17 DIAGNOSIS — D692 Other nonthrombocytopenic purpura: Secondary | ICD-10-CM | POA: Diagnosis not present

## 2021-03-17 DIAGNOSIS — L821 Other seborrheic keratosis: Secondary | ICD-10-CM | POA: Diagnosis not present

## 2021-03-21 NOTE — Progress Notes (Signed)
Remote pacemaker transmission.   

## 2021-03-25 ENCOUNTER — Telehealth: Payer: Self-pay | Admitting: Interventional Cardiology

## 2021-03-25 NOTE — Telephone Encounter (Signed)
Pt c/o medication issue:  1. Name of Medication:  apixaban (ELIQUIS) 5 MG TABS tablet  2. How are you currently taking this medication (dosage and times per day)?  As prescribed  3. Are you having a reaction (difficulty breathing--STAT)?  No   4. What is your medication issue?  Patient's wife states the patient is interested in cutting back on his blood thinner because he's been bleeding. He states his skin is very dry and his skin will tear and bleed very easily.

## 2021-03-26 NOTE — Telephone Encounter (Signed)
Pt is taking correct dose of Eliquis '5mg'$  BID for afib indication based on weight > 60kg and SCr 1.5 (most recently checked 03/05/21 in [place] and was 1.03). Hgb slightly lower at 11.6 on 7/20 (previously ranged 12-13). His CHADS2VASc score is 52 (age x2, CHF, HTN, CAD).   If main issue is dry skin that tears and bleeds, recommend trying to moisturize more to see if that helps. Unfortunately bruising/bleeding is a side effect associated with all anticoagulants and he's already taking the one with a better safety profile.  Potential dose decrease of Eliquis would need to come from physician orders as his risk of stroke would increase due to subtherapeutic anticoagulation.

## 2021-03-26 NOTE — Telephone Encounter (Signed)
Spoke with pt's wife,DPR and advised per Pharmacy team pt is on correct dose of Eliquis for weight and renal function.  Recommended pt try moisturizing skin twice daily with Eucerin cream. Educated pt's wife regarding applying pressure with 2 fingers for 10 minutes with skin tears.  Pt's wife denies pt having current blood in urine or stool. Pt's wife verbalizes understanding and agrees with current plan to continue Eliquis '5mg'$  - 1 tablet by mouth twice daily and will contact office with any change in pt's status.

## 2021-04-22 ENCOUNTER — Encounter: Payer: Self-pay | Admitting: Podiatry

## 2021-04-22 ENCOUNTER — Other Ambulatory Visit: Payer: Self-pay

## 2021-04-22 ENCOUNTER — Ambulatory Visit (INDEPENDENT_AMBULATORY_CARE_PROVIDER_SITE_OTHER): Payer: Medicare Other | Admitting: Podiatry

## 2021-04-22 DIAGNOSIS — D2371 Other benign neoplasm of skin of right lower limb, including hip: Secondary | ICD-10-CM

## 2021-04-22 DIAGNOSIS — D2372 Other benign neoplasm of skin of left lower limb, including hip: Secondary | ICD-10-CM | POA: Diagnosis not present

## 2021-04-22 DIAGNOSIS — D689 Coagulation defect, unspecified: Secondary | ICD-10-CM

## 2021-04-22 DIAGNOSIS — B351 Tinea unguium: Secondary | ICD-10-CM | POA: Diagnosis not present

## 2021-04-22 DIAGNOSIS — M79676 Pain in unspecified toe(s): Secondary | ICD-10-CM | POA: Diagnosis not present

## 2021-04-22 NOTE — Progress Notes (Signed)
He presents today for follow-up of his painful elongated toenails and calluses subfifth metatarsal head of his he states that he recently he had been diagnosed with neuropathy and is currently taking Lyrica which has made his pain in his feet completely go away.  Objective: Vital signs are stable he is alert and oriented x3 still has some edema to the bilateral lower extremity seems to be little bit worse possibly associated with the Lyrica prescription.  Also he does have painful hammertoe deformities bilaterally.  Elongated toenails and reactive hyperkeratotic lesion beneath the fifth metatarsal head of the left foot.  No open lesions or wounds are noted.  Assessment: Loss of fat pad metatarsalgia painful benign skin lesion plantar aspect fifth metatarsal left foot also painful elongated toenails 1 through 5 bilaterally.  Plan: Discussed etiology pathology conservative surgical therapies at this point I think the nail debridement will alleviate some of his symptoms as well as the debridement of the painful benign skin lesion.  I have recommended that he continue to take the Lyrica however I recommend that he take it on a regular basis rather than a as needed basis primarily for relieving the extra edema from the bilateral lower extremity can possibly be associated with it.

## 2021-05-21 DIAGNOSIS — Z23 Encounter for immunization: Secondary | ICD-10-CM | POA: Diagnosis not present

## 2021-05-22 DIAGNOSIS — H35363 Drusen (degenerative) of macula, bilateral: Secondary | ICD-10-CM | POA: Diagnosis not present

## 2021-05-22 DIAGNOSIS — H401132 Primary open-angle glaucoma, bilateral, moderate stage: Secondary | ICD-10-CM | POA: Diagnosis not present

## 2021-05-22 DIAGNOSIS — Z961 Presence of intraocular lens: Secondary | ICD-10-CM | POA: Diagnosis not present

## 2021-05-22 DIAGNOSIS — H353133 Nonexudative age-related macular degeneration, bilateral, advanced atrophic without subfoveal involvement: Secondary | ICD-10-CM | POA: Diagnosis not present

## 2021-05-22 DIAGNOSIS — H43813 Vitreous degeneration, bilateral: Secondary | ICD-10-CM | POA: Diagnosis not present

## 2021-05-28 ENCOUNTER — Ambulatory Visit (INDEPENDENT_AMBULATORY_CARE_PROVIDER_SITE_OTHER): Payer: Medicare Other

## 2021-05-28 DIAGNOSIS — I255 Ischemic cardiomyopathy: Secondary | ICD-10-CM | POA: Diagnosis not present

## 2021-05-29 LAB — CUP PACEART REMOTE DEVICE CHECK
Battery Impedance: 1830 Ohm
Battery Remaining Longevity: 40 mo
Battery Voltage: 2.75 V
Brady Statistic RV Percent Paced: 19 %
Date Time Interrogation Session: 20221013105018
Implantable Lead Implant Date: 20080818
Implantable Lead Implant Date: 20080818
Implantable Lead Location: 753859
Implantable Lead Location: 753860
Implantable Lead Model: 4469
Implantable Lead Model: 4470
Implantable Lead Serial Number: 499409
Implantable Lead Serial Number: 602985
Implantable Pulse Generator Implant Date: 20150320
Lead Channel Impedance Value: 465 Ohm
Lead Channel Impedance Value: 67 Ohm
Lead Channel Pacing Threshold Amplitude: 1.125 V
Lead Channel Pacing Threshold Pulse Width: 0.4 ms
Lead Channel Setting Pacing Amplitude: 2.5 V
Lead Channel Setting Pacing Pulse Width: 0.4 ms
Lead Channel Setting Sensing Sensitivity: 5.6 mV

## 2021-06-03 DIAGNOSIS — M5416 Radiculopathy, lumbar region: Secondary | ICD-10-CM | POA: Diagnosis not present

## 2021-06-05 NOTE — Progress Notes (Signed)
Remote pacemaker transmission.   

## 2021-06-11 DIAGNOSIS — D6869 Other thrombophilia: Secondary | ICD-10-CM | POA: Diagnosis not present

## 2021-06-11 DIAGNOSIS — G629 Polyneuropathy, unspecified: Secondary | ICD-10-CM | POA: Diagnosis not present

## 2021-06-11 DIAGNOSIS — E279 Disorder of adrenal gland, unspecified: Secondary | ICD-10-CM | POA: Diagnosis not present

## 2021-06-11 DIAGNOSIS — M109 Gout, unspecified: Secondary | ICD-10-CM | POA: Diagnosis not present

## 2021-06-11 DIAGNOSIS — Z0000 Encounter for general adult medical examination without abnormal findings: Secondary | ICD-10-CM | POA: Diagnosis not present

## 2021-06-11 DIAGNOSIS — R609 Edema, unspecified: Secondary | ICD-10-CM | POA: Diagnosis not present

## 2021-06-11 DIAGNOSIS — Z79899 Other long term (current) drug therapy: Secondary | ICD-10-CM | POA: Diagnosis not present

## 2021-06-11 DIAGNOSIS — E039 Hypothyroidism, unspecified: Secondary | ICD-10-CM | POA: Diagnosis not present

## 2021-06-11 DIAGNOSIS — R1310 Dysphagia, unspecified: Secondary | ICD-10-CM | POA: Diagnosis not present

## 2021-06-11 DIAGNOSIS — E785 Hyperlipidemia, unspecified: Secondary | ICD-10-CM | POA: Diagnosis not present

## 2021-06-19 DIAGNOSIS — M5459 Other low back pain: Secondary | ICD-10-CM | POA: Diagnosis not present

## 2021-07-01 DIAGNOSIS — R3912 Poor urinary stream: Secondary | ICD-10-CM | POA: Diagnosis not present

## 2021-07-01 DIAGNOSIS — N401 Enlarged prostate with lower urinary tract symptoms: Secondary | ICD-10-CM | POA: Diagnosis not present

## 2021-07-22 ENCOUNTER — Ambulatory Visit: Payer: Medicare Other | Admitting: Podiatry

## 2021-07-23 DIAGNOSIS — M109 Gout, unspecified: Secondary | ICD-10-CM | POA: Diagnosis not present

## 2021-07-23 DIAGNOSIS — R609 Edema, unspecified: Secondary | ICD-10-CM | POA: Diagnosis not present

## 2021-07-23 DIAGNOSIS — I872 Venous insufficiency (chronic) (peripheral): Secondary | ICD-10-CM | POA: Diagnosis not present

## 2021-07-28 DIAGNOSIS — I8310 Varicose veins of unspecified lower extremity with inflammation: Secondary | ICD-10-CM | POA: Diagnosis not present

## 2021-07-28 DIAGNOSIS — M109 Gout, unspecified: Secondary | ICD-10-CM | POA: Diagnosis not present

## 2021-07-28 DIAGNOSIS — R030 Elevated blood-pressure reading, without diagnosis of hypertension: Secondary | ICD-10-CM | POA: Diagnosis not present

## 2021-08-14 ENCOUNTER — Encounter: Payer: Self-pay | Admitting: Podiatry

## 2021-08-14 ENCOUNTER — Ambulatory Visit (INDEPENDENT_AMBULATORY_CARE_PROVIDER_SITE_OTHER): Payer: Medicare Other | Admitting: Podiatry

## 2021-08-14 ENCOUNTER — Other Ambulatory Visit: Payer: Self-pay

## 2021-08-14 DIAGNOSIS — D689 Coagulation defect, unspecified: Secondary | ICD-10-CM | POA: Diagnosis not present

## 2021-08-14 DIAGNOSIS — B351 Tinea unguium: Secondary | ICD-10-CM | POA: Diagnosis not present

## 2021-08-14 DIAGNOSIS — M79676 Pain in unspecified toe(s): Secondary | ICD-10-CM | POA: Diagnosis not present

## 2021-08-14 DIAGNOSIS — D2371 Other benign neoplasm of skin of right lower limb, including hip: Secondary | ICD-10-CM | POA: Diagnosis not present

## 2021-08-14 DIAGNOSIS — D2372 Other benign neoplasm of skin of left lower limb, including hip: Secondary | ICD-10-CM | POA: Diagnosis not present

## 2021-08-14 NOTE — Progress Notes (Signed)
He presents today with his son with a chief complaint of painful elongated toenails.  Objective: Pulses are palpable bilateral no open lesions or wounds.  Toenails are long thick yellow dystrophic clinical mycotic he has severe loss of fat pad and is experiencing metatarsalgia.'s few small reactive hyperkeratotic lesions are noted plantarly and debrided.  No iatrogenic lesions are noted.  Assessment: Pain in limb secondary to onychomycosis and benign skin lesions.  Plan: Debridement of benign skin lesion debridement of toenails 1 through 5 bilaterally.  Follow-up with him in 3 months

## 2021-09-09 ENCOUNTER — Ambulatory Visit: Payer: Medicare Other | Admitting: Podiatry

## 2021-09-12 DIAGNOSIS — H401132 Primary open-angle glaucoma, bilateral, moderate stage: Secondary | ICD-10-CM | POA: Diagnosis not present

## 2021-09-17 ENCOUNTER — Ambulatory Visit: Payer: Medicare Other | Admitting: Interventional Cardiology

## 2021-09-24 DIAGNOSIS — I8310 Varicose veins of unspecified lower extremity with inflammation: Secondary | ICD-10-CM | POA: Diagnosis not present

## 2021-09-30 ENCOUNTER — Encounter: Payer: Self-pay | Admitting: Podiatry

## 2021-09-30 ENCOUNTER — Other Ambulatory Visit: Payer: Self-pay

## 2021-09-30 ENCOUNTER — Ambulatory Visit (INDEPENDENT_AMBULATORY_CARE_PROVIDER_SITE_OTHER): Payer: Medicare Other | Admitting: Podiatry

## 2021-09-30 DIAGNOSIS — D2371 Other benign neoplasm of skin of right lower limb, including hip: Secondary | ICD-10-CM | POA: Diagnosis not present

## 2021-10-01 NOTE — Progress Notes (Signed)
He presents today chief complaint of a painful callus to the plantar aspect of his right heel.  Objective: Vital signs are stable pulses are palpable.  He has a benign skin lesion plantar aspect of his right heel.  There is no erythema edema cellulitis drainage odor no open lesions or wounds.  Assessment: Benign skin lesion.  Plan: Debridement of benign skin lesion.  Follow-up with him in the near future for routine nail debridement.

## 2021-10-02 DIAGNOSIS — I872 Venous insufficiency (chronic) (peripheral): Secondary | ICD-10-CM | POA: Diagnosis not present

## 2021-10-02 DIAGNOSIS — R609 Edema, unspecified: Secondary | ICD-10-CM | POA: Diagnosis not present

## 2021-11-02 NOTE — Progress Notes (Signed)
?Cardiology Office Note:   ? ?Date:  11/03/2021  ? ?ID:  [name], DOB [birth year], [name] [id number] ? ?PCP:  [name], MD  ?Cardiologist:  Sinclair Grooms, MD  ? ?Referring MD: [name], MD  ? ?Chief Complaint  ?Patient presents with  ? Congestive Heart Failure  ? ? ?History of Present Illness:   ? ?Adam [name] is a 88 y.o. male with a hx of ischemic cardiomyopathy, permanent pacemaker, chronic systolic heart failure, chronic atrial fibrillation, CAD with bypass graft failure (saphenous vein graft to the obtuse marginal), hypertension, chronic anticoagulation therapy, hypertension, and hyperlipidemia. EF is 35% per echo from 03/2016. ? ?Exertional fatigue and dyspnea.  Decreased mobility.  No orthopnea.  Positive lower extremity swelling and gets intermittent wrapping.  Wearing compression stockings today.  No angina or nitroglycerin use. ? ?Past Medical History:  ?Diagnosis Date  ? AAA (abdominal aortic aneurysm)   ? Anginal pain ([place])   ? Arthritis   ? CAD (coronary artery disease)   ? [place]  ? CHF (congestive heart failure) ([place])   ? Chronic anticoagulation   ? Diverticulosis   ? GERD (gastroesophageal reflux disease)   ? Hyperlipidemia   ? Hypertension   ? LBP (low back pain)   ? Permanent atrial fibrillation ([place])   ? decision made at [facility] 2015  ? Presence of permanent cardiac pacemaker   ? S/P CABG (coronary artery bypass graft)   ? Shortness of breath dyspnea   ? Sleep apnea   ? ? ?Past Surgical History:  ?Procedure Laterality Date  ? CARDIOVERSION  06/30/2012  ? Procedure: CARDIOVERSION;  Surgeon: Sinclair Grooms, MD;  Location: [place];  Service: Cardiovascular;  Laterality: N/A;  ? CARDIOVERSION N/A 11/23/2013  ? Procedure: CARDIOVERSION;  Surgeon: Sinclair Grooms, MD;  Location: [facility] ENDOSCOPY;  Service: Cardiovascular;  Laterality: N/A;  ? CATARACT EXTRACTION W/ INTRAOCULAR LENS  IMPLANT, BILATERAL    ? CORONARY ARTERY BYPASS GRAFT  Feb 1989  ? "CABG X6"  ?  ENDOVASCULAR STENT INSERTION  07/28/2011  ? Procedure: ENDOVASCULAR STENT GRAFT INSERTION;  Surgeon: Hinda Lenis, MD;  Location: [place];  Service: Vascular;  Laterality: N/A;  Pt. on Coumadin/ instructed to hold 5 days prior to procedure  ? EYE SURGERY    ? INGUINAL HERNIA REPAIR Right 01/04/2015  ? INGUINAL HERNIA REPAIR Right 01/04/2015  ? Procedure:  OPEN REPAIR RIGHT INGUNIAL HERNIA;  Surgeon: Fanny Skates, MD;  Location: [place];  Service: General;  Laterality: Right;  ? INSERT / REPLACE / REMOVE PACEMAKER  July 2008  ? INSERTION OF MESH Right 01/04/2015  ? Procedure: INSERTION OF MESH;  Surgeon: Fanny Skates, MD;  Location: [place];  Service: General;  Laterality: Right;  ? [place]  ? Bilateral total knee replacements  ? LEFT HEART CATHETERIZATION WITH CORONARY ANGIOGRAM N/A 03/28/2014  ? Procedure: LEFT HEART CATHETERIZATION WITH CORONARY ANGIOGRAM;  Surgeon: Sinclair Grooms, MD;  Location: [facility] CATH LAB;  Service: Cardiovascular;  Laterality: N/A;  ? PERMANENT PACEMAKER GENERATOR CHANGE N/A 11/03/2013  ? Procedure: PERMANENT PACEMAKER GENERATOR CHANGE;  Surgeon: [name], MD;  Location: [facility] CATH LAB;  Service: Cardiovascular;  Laterality: N/A;  ? TONSILLECTOMY    ? ? ?Current Medications: ?Current Meds  ?Medication Sig  ? acetaminophen (TYLENOL) 650 MG CR tablet Take 650 mg by mouth every 8 (eight) hours as needed for pain.  ? allopurinol (  ZYLOPRIM) 300 MG tablet Take 300 mg by mouth daily.  ? apixaban (ELIQUIS) 5 MG TABS tablet Take 5 mg by mouth 2 (two) times daily.  ? atorvastatin (LIPITOR) 10 MG tablet Take 1 tablet (10 mg total) by mouth every evening.  ? Cholecalciferol (VITAMIN D) 2000 UNITS CAPS Take 2,000 Units by mouth daily.  ? colchicine 0.6 MG tablet Take by mouth.  ? dorzolamide (TRUSOPT) 2 % ophthalmic solution Place 1 drop into the right eye 2 times daily.  ? Ferrous Sulfate (IRON PO) Take by mouth.  ? finasteride (PROSCAR) 5 MG tablet Take 5 mg by mouth daily.   ? furosemide (LASIX) 80 MG tablet Take by mouth.  ? latanoprost (XALATAN) 0.005 % ophthalmic solution Place 1 drop into both eyes at bedtime.  ? Multiple Vitamins-Minerals (PRESERVISION AREDS 2+MULTI VIT PO) Take 1 capsule by mouth 2 (two) times daily.   ? nitroGLYCERIN (NITROSTAT) 0.4 MG SL tablet PLACE 1 TABLET UNDER THE TONGUE EVERY 5 MINUTES AS NEEDED FOR CHEST PAIN  ? pregabalin (LYRICA) 75 MG capsule SMARTSIG:1 Capsule(s) By Mouth Every 12 Hours PRN  ? silodosin (RAPAFLO) 8 MG CAPS capsule Take 8 mg by mouth at bedtime.   ? VITAMIN D PO Take by mouth.  ?  ? ?Allergies:   Amlodipine, Codeine, Lisinopril, Morphine, and Amiodarone  ? ?Social History  ? ?Socioeconomic History  ? Marital status: Married  ?  Spouse name: Not on file  ? Number of children: Not on file  ? Years of education: Not on file  ? Highest education level: Not on file  ?Occupational History  ? Not on file  ?Tobacco Use  ? Smoking status: Former  ?  Years: 3.00  ?  Types: Cigarettes  ?  Quit date: 08/17/1948  ?  Years since quitting: 73.2  ? Smokeless tobacco: Never  ?Vaping Use  ? Vaping Use: Never used  ?Substance and Sexual Activity  ? Alcohol use: No  ? Drug use: No  ? Sexual activity: Not on file  ?Other Topics Concern  ? Not on file  ?Social History Narrative  ? Not on file  ? ?Social Determinants of Health  ? ?Financial Resource Strain: Not on file  ?Food Insecurity: Not on file  ?Transportation Needs: Not on file  ?Physical Activity: Not on file  ?Stress: Not on file  ?Social Connections: Not on file  ?  ? ?Family History: ?The patient's family history includes Cancer in his sister; Heart attack in his father; Heart disease in his father and sister; Heart failure in his mother; Other in his sister; Stroke in his mother; Varicose Veins in his mother. ? ?ROS:   ?Please see the history of present illness.    ?Appetite is stable.  Wife states that he breathes in the unlabored manner when asleep.  All other systems reviewed and are  negative. ? ?EKGs/Labs/Other Studies Reviewed:   ? ?The following studies were reviewed today: ?He is doing okay.  No recent laboratory data. ? ?EKG:  EKG atrial fibrillation with controlled slow ventricular response.  No pacing noted.  When compared to the prior tracing, heart rate is slightly faster. ? ?Recent Labs: ?No results found for requested labs within last 8760 hours.  ?Recent Lipid Panel ?   ?Component Value Date/Time  ? CHOL 138 01/03/2018 1146  ? TRIG 86 01/03/2018 1146  ? HDL 53 01/03/2018 1146  ? CHOLHDL 2.6 01/03/2018 1146  ? [place] 68 01/03/2018 1146  ? ? ?Physical Exam:   ? ?  VS:  BP 124/62   Pulse (!) 52   Ht '5\' 8"'$  (1.727 m)   Wt 161 lb 12.8 oz (73.4 kg)   SpO2 95%   BMI 24.60 kg/m?    ? ?Wt Readings from Last 3 Encounters:  ?11/03/21 161 lb 12.8 oz (73.4 kg)  ?01/14/21 146 lb 12.8 oz (66.6 kg)  ?11/19/20 144 lb (65.3 kg)  ?  ? ?GEN: Consistent with age.  Frail in appearance.. No acute distress ?HEENT: Normal ?NECK: No JVD. ?LYMPHATICS: No lymphadenopathy ?CARDIAC: Holosystolic left midsternal systolic murmur.  Irregularly irregular RR no gallop, with 2+ bilateral pedal to upper shin edema. ?VASCULAR:  Normal Pulses. No bruits. ?RESPIRATORY:  Clear to auscultation without rales, wheezing or rhonchi  ?ABDOMEN: Soft, non-tender, non-distended, No pulsatile mass, ?MUSCULOSKELETAL: No deformity  ?SKIN: Warm and dry ?NEUROLOGIC:  Alert and oriented x 3 ?PSYCHIATRIC:  Normal affect  ? ?ASSESSMENT:   ? ?1. Chronic atrial fibrillation (HCC)   ?2. Chronic systolic heart failure ([place])   ?3. PACEMAKER, PERMANENT-[employer]   ?4. Sinus brady-tachy syndrome ([place])   ?5. S/P AAA (abdominal aortic aneurysm) repair   ?6. Other hyperlipidemia   ?7. Long term current use of anticoagulant therapy   ? ?PLAN:   ? ?In order of problems listed above: ? ?Controlled, slow rate. ?No rales are heard.  Continue current heart failure regimen which includes furosemide for decongestion.  He has been unable to tolerate heart  failure therapy due to hypotension. ?Permanent pacemaker function is being monitored. ?Permanent pacemaker management ?Not being actively followed ?Continue statin therapy ?Continue Eliquis therapy twice daily.  [place]

## 2021-11-03 ENCOUNTER — Ambulatory Visit (INDEPENDENT_AMBULATORY_CARE_PROVIDER_SITE_OTHER): Payer: Medicare Other | Admitting: Interventional Cardiology

## 2021-11-03 ENCOUNTER — Encounter: Payer: Self-pay | Admitting: Interventional Cardiology

## 2021-11-03 ENCOUNTER — Other Ambulatory Visit: Payer: Self-pay

## 2021-11-03 VITALS — BP 124/62 | HR 52 | Ht 68.0 in | Wt 161.8 lb

## 2021-11-03 DIAGNOSIS — I482 Chronic atrial fibrillation, unspecified: Secondary | ICD-10-CM | POA: Diagnosis not present

## 2021-11-03 DIAGNOSIS — Z7901 Long term (current) use of anticoagulants: Secondary | ICD-10-CM | POA: Diagnosis not present

## 2021-11-03 DIAGNOSIS — I5022 Chronic systolic (congestive) heart failure: Secondary | ICD-10-CM

## 2021-11-03 DIAGNOSIS — Z950 Presence of cardiac pacemaker: Secondary | ICD-10-CM

## 2021-11-03 DIAGNOSIS — E7849 Other hyperlipidemia: Secondary | ICD-10-CM | POA: Diagnosis not present

## 2021-11-03 DIAGNOSIS — Z9889 Other specified postprocedural states: Secondary | ICD-10-CM

## 2021-11-03 DIAGNOSIS — Z8679 Personal history of other diseases of the circulatory system: Secondary | ICD-10-CM | POA: Diagnosis not present

## 2021-11-03 DIAGNOSIS — I495 Sick sinus syndrome: Secondary | ICD-10-CM

## 2021-11-03 NOTE — Patient Instructions (Signed)
Medication Instructions:  ?Your physician recommends that you continue on your current medications as directed. Please refer to the Current Medication list given to you today. ? ?*If you need a refill on your cardiac medications before your next appointment, please call your pharmacy* ? ? ?Lab Work: ?BMET, CBC and Pro BNP today ? ?If you have labs (blood work) drawn today and your tests are completely normal, you will receive your results only by: ?MyChart Message (if you have MyChart) OR ?A paper copy in the mail ?If you have any lab test that is abnormal or we need to change your treatment, we will call you to review the results. ? ? ?Testing/Procedures: ?None ? ? ?Follow-Up: ?At [facility], you and your health needs are our priority.  As part of our continuing mission to provide you with exceptional heart care, we have created designated Provider Care Teams.  These Care Teams include your primary Cardiologist (physician) and Advanced Practice Providers (APPs -  Physician Assistants and Nurse Practitioners) who all work together to provide you with the care you need, when you need it. ? ?We recommend signing up for the patient portal called "MyChart".  Sign up information is provided on this After Visit Summary.  MyChart is used to connect with patients for Virtual Visits (Telemedicine).  Patients are able to view lab/test results, encounter notes, upcoming appointments, etc.  Non-urgent messages can be sent to your provider as well.   ?To learn more about what you can do with MyChart, go to [email or web address].   ? ?Your next appointment:   ?1 year(s) ? ?The format for your next appointment:   ?In Person ? ?Provider:   ?Sinclair Grooms, MD  ? ? ?Other Instructions ?  ?

## 2021-11-04 LAB — BASIC METABOLIC PANEL
BUN/Creatinine Ratio: 25 — ABNORMAL HIGH (ref 10–24)
BUN: 28 mg/dL (ref 10–36)
CO2: 28 mmol/L (ref 20–29)
Calcium: 9.1 mg/dL (ref 8.6–10.2)
Chloride: 101 mmol/L (ref 96–106)
Creatinine, Ser: 1.13 mg/dL (ref 0.76–1.27)
Glucose: 114 mg/dL — ABNORMAL HIGH (ref 70–99)
Potassium: 4.7 mmol/L (ref 3.5–5.2)
Sodium: 143 mmol/L (ref 134–144)
eGFR: 60 mL/min/{1.73_m2} (ref >59)

## 2021-11-04 LAB — CBC
Hematocrit: 37.1 % — ABNORMAL LOW (ref 37.5–51.0)
Hemoglobin: 12.1 g/dL — ABNORMAL LOW (ref 13.0–17.7)
MCH: 31.7 pg (ref 26.6–33.0)
MCHC: 32.6 g/dL (ref 31.5–35.7)
MCV: 97 fL (ref 79–97)
Platelets: 130 10*3/uL — ABNORMAL LOW (ref 150–450)
RBC: 3.82 x10E6/uL — ABNORMAL LOW (ref 4.14–5.80)
RDW: 13.9 % (ref 11.6–15.4)
WBC: 10.6 10*3/uL (ref 3.4–10.8)

## 2021-11-04 LAB — PRO B NATRIURETIC PEPTIDE: NT-Pro BNP: 1321 pg/mL — ABNORMAL HIGH (ref 0–486)

## 2021-11-06 DIAGNOSIS — M5416 Radiculopathy, lumbar region: Secondary | ICD-10-CM | POA: Diagnosis not present

## 2021-11-07 ENCOUNTER — Telehealth: Payer: Self-pay | Admitting: *Deleted

## 2021-11-07 DIAGNOSIS — I5022 Chronic systolic (congestive) heart failure: Secondary | ICD-10-CM

## 2021-11-07 MED ORDER — DAPAGLIFLOZIN PROPANEDIOL 10 MG PO TABS: 10.0000 mg | ORAL_TABLET | Freq: Every day | ORAL | 11 refills | Status: DC

## 2021-11-07 NOTE — Telephone Encounter (Signed)
Spoke with wife and made her aware of results and recommendations.  She is agreeable to plan.  She will come by today and pick up samples of [place].  Labs will be done 4/10.  Wife very appreciative for assistance.  ?

## 2021-11-07 NOTE — Telephone Encounter (Signed)
-----  Message from [name], MD sent at 11/04/2021  9:11 AM EDT ----- ?Let the patient know the labs are stable with normal kidney function.  The BNP is elevated suggesting heart failure.  Start [place] or Jardiance 10 mg/day.  Be met in 2 to 4 weeks.  BNP at the same time.  Tell him this will help to treat some of the lower extremity edema and may improve his breathing. ?A copy will be sent to [name], MD ?

## 2021-11-13 ENCOUNTER — Ambulatory Visit (INDEPENDENT_AMBULATORY_CARE_PROVIDER_SITE_OTHER): Payer: Medicare Other | Admitting: Podiatry

## 2021-11-13 DIAGNOSIS — D2371 Other benign neoplasm of skin of right lower limb, including hip: Secondary | ICD-10-CM

## 2021-11-13 DIAGNOSIS — D689 Coagulation defect, unspecified: Secondary | ICD-10-CM

## 2021-11-13 DIAGNOSIS — B351 Tinea unguium: Secondary | ICD-10-CM | POA: Diagnosis not present

## 2021-11-13 DIAGNOSIS — D2372 Other benign neoplasm of skin of left lower limb, including hip: Secondary | ICD-10-CM

## 2021-11-13 DIAGNOSIS — M79676 Pain in unspecified toe(s): Secondary | ICD-10-CM | POA: Diagnosis not present

## 2021-11-13 NOTE — Progress Notes (Signed)
Presents today chief complaint of painful lesions plantar aspect of the bilateral foot.  Toenails are long thick yellow dystrophic mycotic painful lesions plantar aspect of forefoot bilateral. ? ?Assessment: Debridement of toenails 1 through 5 bilateral.  Debridement of painful lesions plantar aspect forefoot bilateral. ? ?Plan: Debridement of benign skin lesions bilateral debridement of nails 1 through 5 bilateral. ?

## 2021-11-16 ENCOUNTER — Other Ambulatory Visit: Payer: Self-pay

## 2021-11-16 DIAGNOSIS — I714 Abdominal aortic aneurysm, without rupture, unspecified: Secondary | ICD-10-CM

## 2021-11-16 NOTE — Addendum Note (Signed)
Addended byDoylene Bode on: 11/16/2021 12:11 PM ? ? Modules accepted: Orders ? ?

## 2021-11-18 ENCOUNTER — Ambulatory Visit: Payer: Medicare Other | Admitting: Interventional Cardiology

## 2021-11-24 ENCOUNTER — Other Ambulatory Visit: Payer: Medicare Other | Admitting: *Deleted

## 2021-11-24 ENCOUNTER — Other Ambulatory Visit: Payer: Self-pay

## 2021-11-24 DIAGNOSIS — I5022 Chronic systolic (congestive) heart failure: Secondary | ICD-10-CM

## 2021-11-24 DIAGNOSIS — I482 Chronic atrial fibrillation, unspecified: Secondary | ICD-10-CM

## 2021-11-24 MED ORDER — APIXABAN 5 MG PO TABS: 5.0000 mg | ORAL_TABLET | Freq: Two times a day (BID) | ORAL | 0 refills | Status: DC

## 2021-11-24 NOTE — Telephone Encounter (Signed)
Prescription refill request for Eliquis received. ?Indication: Afib  ?Last office visit: 11/03/21 [name]) ?Scr: 1.13 (11/03/21) ?Age: 88 ?Weight: 73.4kg ? ?Appropriate dose and refill sent to requested pharmacy.  ?

## 2021-11-25 ENCOUNTER — Other Ambulatory Visit: Payer: Self-pay | Admitting: Pharmacist

## 2021-11-25 ENCOUNTER — Encounter: Payer: Self-pay | Admitting: Vascular Surgery

## 2021-11-25 ENCOUNTER — Ambulatory Visit (INDEPENDENT_AMBULATORY_CARE_PROVIDER_SITE_OTHER): Payer: Medicare Other | Admitting: Vascular Surgery

## 2021-11-25 ENCOUNTER — Ambulatory Visit (HOSPITAL_COMMUNITY)
Admission: RE | Admit: 2021-11-25 | Discharge: 2021-11-25 | Disposition: A | Discharge status: Home / Self Care | Payer: Medicare Other | Source: Ambulatory Visit | Attending: Vascular Surgery | Admitting: Vascular Surgery

## 2021-11-25 VITALS — BP 157/77 | HR 49 | Temp 98.1°F | Resp 20 | Ht 68.0 in | Wt 155.0 lb

## 2021-11-25 DIAGNOSIS — I482 Chronic atrial fibrillation, unspecified: Secondary | ICD-10-CM

## 2021-11-25 DIAGNOSIS — I7143 Infrarenal abdominal aortic aneurysm, without rupture: Secondary | ICD-10-CM | POA: Diagnosis not present

## 2021-11-25 DIAGNOSIS — I714 Abdominal aortic aneurysm, without rupture, unspecified: Secondary | ICD-10-CM | POA: Diagnosis not present

## 2021-11-25 MED ORDER — APIXABAN 5 MG PO TABS: 5.0000 mg | ORAL_TABLET | Freq: Two times a day (BID) | ORAL | 5 refills | Status: DC

## 2021-11-25 NOTE — Telephone Encounter (Signed)
Prescription refill request for Eliquis received. ?Indication:afib ?Last office visit: 11/03/21 ?Scr: 1.09 ?Age: 88 ?Weight:73.4 ? ?

## 2021-11-25 NOTE — Progress Notes (Signed)
? ?Patient name: Adam [name] [name]: [id number] DOB: [birth year] Sex: male ? ?REASON FOR VISIT: 1 year follow-up for surveillance of EVAR in 2012 ? ?HPI: ?Adam [name] is a 88 y.o. male with history of coronary disease status post CABG, atrial fibrillation on DOAC, hypertension, hyperlipidemia, congestive heart failure, previous abdominal aortic aneurysm status post EVAR in 2012 with Dr. Bridgett Larsson.  He was seen in 05/2019 by the nurse practitioner for further surveillance.  His aneurysm was noted to increase from 4.7 to 5.4 cm.  There was no endoleak identified.  He was subsequently sent to see me from our NP.  I initially discussed plans for CTA for further evaluation.  Patient was very hesitant about a CT scan given a very poor experience in the past with IV access while on coumadin.  Ultimately elected 3 month follow-up with another duplex for ongoing surveillance.  He has had multiple duplexes including January 2021, August 2021, December 2021 and April 2022 that have all shown no significant growth and stable at 5.4 cm.  ? ?He reports no significant changes to his health over the last year other than hearing loss.  No new abdominal pain. ? ? ?Past Medical History:  ?Diagnosis Date  ? AAA (abdominal aortic aneurysm) ([place])   ? Anginal pain ([place])   ? Arthritis   ? CAD (coronary artery disease)   ? [place]  ? CHF (congestive heart failure) ([place])   ? Chronic anticoagulation   ? Diverticulosis   ? GERD (gastroesophageal reflux disease)   ? Hyperlipidemia   ? Hypertension   ? LBP (low back pain)   ? Permanent atrial fibrillation ([place])   ? decision made at [facility] 2015  ? Presence of permanent cardiac pacemaker   ? S/P CABG (coronary artery bypass graft)   ? Shortness of breath dyspnea   ? Sleep apnea   ? ? ?Past Surgical History:  ?Procedure Laterality Date  ? CARDIOVERSION  06/30/2012  ? Procedure: CARDIOVERSION;  Surgeon: Sinclair Grooms, MD;  Location: [place];  Service: Cardiovascular;  Laterality:  N/A;  ? CARDIOVERSION N/A 11/23/2013  ? Procedure: CARDIOVERSION;  Surgeon: Sinclair Grooms, MD;  Location[facility] ENDOSCOPY;  Service: Cardiovascular;  Laterality: N/A;  ? CATARACT EXTRACTION W/ INTRAOCULAR LENS  IMPLANT, BILATERAL    ? CORONARY ARTERY BYPASS GRAFT  Feb 1989  ? "CABG X6"  ? ENDOVASCULAR STENT INSERTION  07/28/2011  ? Procedure: ENDOVASCULAR STENT GRAFT INSERTION;  Surgeon: Hinda Lenis, MD;  Location: [place];  Service: Vascular;  Laterality: N/A;  Pt. on Coumadin/ instructed to hold 5 days prior to procedure  ? EYE SURGERY    ? INGUINAL HERNIA REPAIR Right 01/04/2015  ? INGUINAL HERNIA REPAIR Right 01/04/2015  ? Procedure:  OPEN REPAIR RIGHT INGUNIAL HERNIA;  Surgeon: Fanny Skates, MD;  Location: [place];  Service: General;  Laterality: Right;  ? INSERT / REPLACE / REMOVE PACEMAKER  July 2008  ? INSERTION OF MESH Right 01/04/2015  ? Procedure: INSERTION OF MESH;  Surgeon: Fanny Skates, MD;  Location: [place];  Service: General;  Laterality: Right;  ? [place]  ? Bilateral total knee replacements  ? LEFT HEART CATHETERIZATION WITH CORONARY ANGIOGRAM N/A 03/28/2014  ? Procedure: LEFT HEART CATHETERIZATION WITH CORONARY ANGIOGRAM;  Surgeon: Sinclair Grooms, MD;  Location: [facility] CATH LAB;  Service: Cardiovascular;  Laterality: N/A;  ? PERMANENT PACEMAKER GENERATOR CHANGE N/A 11/03/2013  ? Procedure: PERMANENT PACEMAKER  GENERATOR CHANGE;  Surgeon: [name], MD;  Location: [facility] CATH LAB;  Service: Cardiovascular;  Laterality: N/A;  ? TONSILLECTOMY    ? ? ?Family History  ?Problem Relation Age of Onset  ? Stroke Mother   ? Heart failure Mother   ? Varicose Veins Mother   ? Heart attack Father   ? Heart disease Father   ?     Before age 51    Heart Attack age 1 and 5  ? Cancer Sister   ?     breast  ? Heart disease Sister   ?     After age 89  ? Other Sister   ?     DJD  ? ? ?SOCIAL HISTORY: ?Social History  ? ?Tobacco Use  ? Smoking status: Former  ?  Years: 3.00  ?  Types:  Cigarettes  ?  Quit date: 08/17/1948  ?  Years since quitting: 73.3  ? Smokeless tobacco: Never  ?Substance Use Topics  ? Alcohol use: No  ? ? ?Allergies  ?Allergen Reactions  ? Amlodipine Other (See Comments)  ?  Wife unsure but thinks nausea was reaction  ? Codeine Other (See Comments)  ?  Nausea per wife  ? Lisinopril   ?  Elevated potassium  ? Morphine Nausea Only and Other (See Comments)  ? Amiodarone Anxiety and Rash  ? ? ?Current Outpatient Medications  ?Medication Sig Dispense Refill  ? acetaminophen (TYLENOL) 650 MG CR tablet Take 650 mg by mouth every 8 (eight) hours as needed for pain.    ? allopurinol (ZYLOPRIM) 300 MG tablet Take 300 mg by mouth daily.    ? atorvastatin (LIPITOR) 10 MG tablet Take 1 tablet (10 mg total) by mouth every evening. 90 tablet 2  ? Cholecalciferol (VITAMIN D) 2000 UNITS CAPS Take 2,000 Units by mouth daily.    ? colchicine 0.6 MG tablet Take by mouth.    ? dapagliflozin propanediol (FARXIGA) 10 MG TABS tablet Take 1 tablet (10 mg total) by mouth daily before breakfast. 30 tablet 11  ? dorzolamide (TRUSOPT) 2 % ophthalmic solution Place 1 drop into the right eye 2 times daily.    ? Ferrous Sulfate (IRON PO) Take by mouth.    ? finasteride (PROSCAR) 5 MG tablet Take 5 mg by mouth daily.    ? furosemide (LASIX) 80 MG tablet Take by mouth.    ? latanoprost (XALATAN) 0.005 % ophthalmic solution Place 1 drop into both eyes at bedtime.    ? Multiple Vitamins-Minerals (PRESERVISION AREDS 2+MULTI VIT PO) Take 1 capsule by mouth 2 (two) times daily.     ? nitroGLYCERIN (NITROSTAT) 0.4 MG SL tablet PLACE 1 TABLET UNDER THE TONGUE EVERY 5 MINUTES AS NEEDED FOR CHEST PAIN 25 tablet 5  ? pregabalin (LYRICA) 75 MG capsule SMARTSIG:1 Capsule(s) By Mouth Every 12 Hours PRN    ? silodosin (RAPAFLO) 8 MG CAPS capsule Take 8 mg by mouth at bedtime.     ? VITAMIN D PO Take by mouth.    ? VITAMIN E PO Take by mouth.    ? apixaban (ELIQUIS) 5 MG TABS tablet Take 1 tablet (5 mg total) by mouth 2 (two)  times daily. 60 tablet 5  ? ?No current facility-administered medications for this visit.  ? ? ?REVIEW OF SYSTEMS:  ?'[X]'$  denotes positive finding, '[ ]'$  denotes negative finding ?Cardiac  Comments:  ?Chest pain or chest pressure:    ?Shortness of breath upon exertion:    ?Short  of breath when lying flat:    ?Irregular heart rhythm:    ?    ?Vascular    ?Pain in calf, thigh, or hip brought on by ambulation:    ?Pain in feet at night that wakes you up from your sleep:     ?Blood clot in your veins:    ?Leg swelling:     ?    ?Pulmonary    ?Oxygen at home:    ?Productive cough:     ?Wheezing:     ?    ?Neurologic    ?Sudden weakness in arms or legs:     ?Sudden numbness in arms or legs:     ?Sudden onset of difficulty speaking or slurred speech:    ?Temporary loss of vision in one eye:     ?Problems with dizziness:     ?    ?Gastrointestinal    ?Blood in stool:     ?Vomited blood:     ?    ?Genitourinary    ?Burning when urinating:     ?Blood in urine:    ?    ?Psychiatric    ?Major depression:     ?    ?Hematologic    ?Bleeding problems:    ?Problems with blood clotting too easily:    ?    ?Skin    ?Rashes or ulcers:    ?    ?Constitutional    ?Fever or chills:    ? ? ?PHYSICAL EXAM: ?Vitals:  ? 11/25/21 0920  ?BP: (!) 157/77  ?Pulse: (!) 49  ?Resp: 20  ?Temp: 98.1 ?F (36.7 ?C)  ?SpO2: 95%  ?Weight: 155 lb (70.3 kg)  ?Height: '5\' 8"'$  (1.727 m)  ? ? ?GENERAL: The patient is a well-nourished male, in no acute distress. The vital signs are documented above. ?CARDIAC: There is a regular rate and rhythm.  ?VASCULAR:  ?2+ palpable femoral pulses bilaterally ?PULMONARY: No respiratory distress. ?ABDOMEN: Soft and non-tender.  No pain with deep palpation. ?MUSCULOSKELETAL: There are no major deformities or cyanosis. ?NEUROLOGIC: No focal weakness or paresthesias are detected. ?SKIN: There are no ulcers or rashes noted. ? ? ?DATA:  ? ?EVAR duplex in 2019 showed 4.7 cm aneurysm ? ?EVAR duplex 09/12/19 showed largest aortic  measurement being 5.5 cm from 5.4 cm 3 months prior. ? ?EVAR duplex 04/16/20 showed largest aortic measurement 5.44 cm ? ?EVAR duplex 08/13/20 showed largest aortic measurement 5.46 cm ? ?EVAR duplex 11/25/2021 shows max

## 2021-11-26 ENCOUNTER — Ambulatory Visit (INDEPENDENT_AMBULATORY_CARE_PROVIDER_SITE_OTHER): Payer: Medicare Other

## 2021-11-26 ENCOUNTER — Telehealth: Payer: Self-pay | Admitting: Internal Medicine

## 2021-11-26 DIAGNOSIS — I495 Sick sinus syndrome: Secondary | ICD-10-CM

## 2021-11-26 NOTE — Telephone Encounter (Signed)
?  Pt's spouse calling, she said, pt's monitor is having issue and can't send transmission. They called the company and was told they will be getting a new monitor in the mail  ?

## 2021-11-28 ENCOUNTER — Telehealth: Payer: Self-pay | Admitting: Interventional Cardiology

## 2021-11-28 MED ORDER — DAPAGLIFLOZIN PROPANEDIOL 10 MG PO TABS: 10.0000 mg | ORAL_TABLET | Freq: Every day | ORAL | 3 refills | Status: DC

## 2021-11-28 NOTE — Addendum Note (Signed)
Addended by: Antonieta Iba on: 11/28/2021 11:19 AM ? ? Modules accepted: Orders ? ?

## 2021-11-28 NOTE — Telephone Encounter (Signed)
[name], MD  ?11/26/2021  8:41 AM EDT Back to Top  ?  ?Let the patient know the labs look good.  Continue current therapy.  His breathing any better on SGLT2? ?A copy will be sent to [name], MD  ? ?Spoke with the patient's wife and advised that the patient should continue on Farxiga. She reports that the patient's swelling has improved. She states that she thinks that his shortness of breath has improved. She admits to not paying much attention to it but thinks that she has just not been noticing it so it has probably been better. ?

## 2021-11-28 NOTE — Telephone Encounter (Signed)
Spoke with the patient's wife and they are almost out of Farxiga samples. They would like the prescription to go to the [facility] in [place]. Rx has been sent in.  ?

## 2021-11-28 NOTE — Telephone Encounter (Signed)
[place] called with additional information.  She wants to know if Dr. [name] has a contract with the [employer].  ?

## 2021-11-28 NOTE — Telephone Encounter (Signed)
Pt c/o medication issue: ? ?1. Name of Medication:  ?dapagliflozin propanediol (FARXIGA) 10 MG TABS tablet ? ?2. How are you currently taking this medication (dosage and times per day)?  ? ?3. Are you having a reaction (difficulty breathing--STAT)?  ? ?4. What is your medication issue?  ? ?Patient's wife would like to know if the patient needs to continue taking [place]. I informed her that it has 11 refills remaining at patient's local pharmacy, but she still requested a call back to verify this.  ? ?

## 2021-12-02 LAB — CUP PACEART REMOTE DEVICE CHECK
Battery Impedance: 2097 Ohm
Battery Remaining Longevity: 36 mo
Battery Voltage: 2.74 V
Brady Statistic RV Percent Paced: 17 %
Date Time Interrogation Session: 20230417132307
Implantable Lead Implant Date: 20080818
Implantable Lead Implant Date: 20080818
Implantable Lead Location: 753859
Implantable Lead Location: 753860
Implantable Lead Model: 4469
Implantable Lead Model: 4470
Implantable Lead Serial Number: 499409
Implantable Lead Serial Number: 602985
Implantable Pulse Generator Implant Date: 20150320
Lead Channel Impedance Value: 481 Ohm
Lead Channel Impedance Value: 67 Ohm
Lead Channel Pacing Threshold Amplitude: 1.5 V
Lead Channel Pacing Threshold Pulse Width: 0.4 ms
Lead Channel Setting Pacing Amplitude: 3 V
Lead Channel Setting Pacing Pulse Width: 0.4 ms
Lead Channel Setting Sensing Sensitivity: 5.6 mV

## 2021-12-05 ENCOUNTER — Other Ambulatory Visit: Payer: Self-pay | Admitting: *Deleted

## 2021-12-05 ENCOUNTER — Telehealth: Payer: Self-pay | Admitting: Interventional Cardiology

## 2021-12-05 MED ORDER — DAPAGLIFLOZIN PROPANEDIOL 10 MG PO TABS: 10.0000 mg | ORAL_TABLET | Freq: Every day | ORAL | 3 refills | Status: DC

## 2021-12-05 NOTE — Telephone Encounter (Signed)
After viewing the chart, looks like Dr. Thompson Caul RN, Anderson [place] B, has already taken care of this. ?

## 2021-12-05 NOTE — Telephone Encounter (Signed)
Pt c/o medication issue: ? ?1. Name of Medication: dapagliflozin propanediol (FARXIGA) 10 MG TABS tablet ? ?2. How are you currently taking this medication (dosage and times per day)? Take 1 tablet (10 mg total) by mouth daily before breakfast. ? ?3. Are you having a reaction (difficulty breathing--STAT)? no ? ?4. What is your medication issue? Calling to see if we have any samples. Refill was sent but the phmaracy states they didn't get it. Resent refill but patient is out of the medication. Please advise  ?[name] ?

## 2021-12-05 NOTE — Telephone Encounter (Signed)
Spoke with wife and she states she contacted the [facility] and they said they did not receive the [place] prescription.  It takes several days for the [facility] to send medication out once they receive the prescription.  Advised I will place samples at the front desk for her to pick up while they are waiting on the [facility] to send out medication.   ?

## 2021-12-05 NOTE — Telephone Encounter (Signed)
?*  STAT* If patient is at the pharmacy, call can be transferred to refill team. ? ? ?1. Which medications need to be refilled? (please list name of each medication and dose if known) dapagliflozin propanediol (FARXIGA) 10 MG TABS tablet ? ?2. Which pharmacy/location (including street and city if local pharmacy) is medication to be sent to? [place], [place] [address] ? ?3. Do they need a 30 day or 90 day supply? 90 ? ?Pharmacy states they didn't get prescription  ? ?

## 2021-12-06 DIAGNOSIS — M5416 Radiculopathy, lumbar region: Secondary | ICD-10-CM | POA: Diagnosis not present

## 2021-12-09 ENCOUNTER — Telehealth: Payer: Self-pay | Admitting: Interventional Cardiology

## 2021-12-09 MED ORDER — DAPAGLIFLOZIN PROPANEDIOL 10 MG PO TABS: 10.0000 mg | ORAL_TABLET | Freq: Every day | ORAL | 3 refills | Status: DC

## 2021-12-09 NOTE — Telephone Encounter (Signed)
Patient's wife was told by the [facility] that Dr. [name] will need to send a new rx to the [facility] order service. The patient will not be able to get this medication from the [place] any longer.  ? ? ?*STAT* If patient is at the pharmacy, call can be transferred to refill team. ? ? ?1. Which medications need to be refilled? (please list name of each medication and dose if known) ?dapagliflozin propanediol (FARXIGA) 10 MG TABS tablet ? ?2. Which pharmacy/location (including street and city if local pharmacy) is medication to be sent to? ?[facility] ([place], [place] ? ?3. Do they need a 30 day or 90 day supply? 90 with refills ? ? ?

## 2021-12-09 NOTE — Telephone Encounter (Signed)
?*  STAT* If patient is at the pharmacy, call can be transferred to refill team. ? ? ?1. Which medications need to be refilled? (please list name of each medication and dose if known) dapagliflozin propanediol (FARXIGA) 10 MG TABS tablet ? ?2. Which pharmacy/location (including street and city if local pharmacy) is medication to be sent to? dapagliflozin propanediol (FARXIGA) 10 MG TABS tablet ? ?3. Do they need a 30 day or 90 day supply? 90 ? ?

## 2021-12-09 NOTE — Telephone Encounter (Signed)
Pt's medication was sent to pt's pharmacy as requested. Confirmation received.  °

## 2021-12-09 NOTE — Telephone Encounter (Signed)
Called pt's wife and she wanted pt's medication to be resent to [facility] mail order pharmacy. Medication resent to pharmacy requested pharmacy. Confirmation received.  ?

## 2021-12-11 ENCOUNTER — Telehealth: Payer: Self-pay | Admitting: Interventional Cardiology

## 2021-12-11 DIAGNOSIS — R001 Bradycardia, unspecified: Secondary | ICD-10-CM | POA: Diagnosis not present

## 2021-12-11 DIAGNOSIS — I8310 Varicose veins of unspecified lower extremity with inflammation: Secondary | ICD-10-CM | POA: Diagnosis not present

## 2021-12-11 DIAGNOSIS — G629 Polyneuropathy, unspecified: Secondary | ICD-10-CM | POA: Diagnosis not present

## 2021-12-11 DIAGNOSIS — R1310 Dysphagia, unspecified: Secondary | ICD-10-CM | POA: Diagnosis not present

## 2021-12-11 LAB — BASIC METABOLIC PANEL
BUN/Creatinine Ratio: 30 — ABNORMAL HIGH (ref 10–24)
BUN: 33 mg/dL (ref 10–36)
CO2: 27 mmol/L (ref 20–29)
Calcium: 9.3 mg/dL (ref 8.6–10.2)
Chloride: 103 mmol/L (ref 96–106)
Creatinine, Ser: 1.09 mg/dL (ref 0.76–1.27)
Glucose: 98 mg/dL (ref 70–99)
Potassium: 3.8 mmol/L (ref 3.5–5.2)
Sodium: 142 mmol/L (ref 134–144)
eGFR: 63 mL/min/{1.73_m2} (ref >59)

## 2021-12-11 LAB — PRO B NATRIURETIC PEPTIDE

## 2021-12-11 NOTE — Telephone Encounter (Signed)
STAT if HR is under 50 or over 120 ?(normal HR is 60-100 beats per minute) ? ?What is your heart rate? 50 ? ?Do you have a log of your heart rate readings (document readings)? No  ? ?Do you have any other symptoms? Tired, sleeps a lot  ?

## 2021-12-11 NOTE — Telephone Encounter (Signed)
Spoke with patient's wife Sherrell (OK per DPR), who returned call. ? ?Sherrell states patient saw his PCP Dr. Harrington Challenger today and was advised to let Dr. [name] know his HR was 50.  ? ?Sherrell reports patient has great appetite, denies CP/SOB/dizziness. Sherrell states patient has been tired and "sleeps a lot" but that this has been going on for a while and has not changed recently. ? ?Advised Sherrell to call and let us know if patient experiences and lightheadedness/dizziness/CP/SOB. Sherrell verbalized understanding. ? ?Will forward to Dr. [name] to review. ?

## 2021-12-11 NOTE — Telephone Encounter (Signed)
Attempted to contact patient, no answer, left message for patient to call back.

## 2021-12-12 NOTE — Progress Notes (Signed)
Remote pacemaker transmission.   

## 2021-12-18 DIAGNOSIS — H401132 Primary open-angle glaucoma, bilateral, moderate stage: Secondary | ICD-10-CM | POA: Diagnosis not present

## 2021-12-18 DIAGNOSIS — H353133 Nonexudative age-related macular degeneration, bilateral, advanced atrophic without subfoveal involvement: Secondary | ICD-10-CM | POA: Diagnosis not present

## 2021-12-18 DIAGNOSIS — Z961 Presence of intraocular lens: Secondary | ICD-10-CM | POA: Diagnosis not present

## 2021-12-18 DIAGNOSIS — H43813 Vitreous degeneration, bilateral: Secondary | ICD-10-CM | POA: Diagnosis not present

## 2022-01-12 DIAGNOSIS — S41112A Laceration without foreign body of left upper arm, initial encounter: Secondary | ICD-10-CM | POA: Diagnosis not present

## 2022-01-12 DIAGNOSIS — M79672 Pain in left foot: Secondary | ICD-10-CM | POA: Diagnosis not present

## 2022-01-14 ENCOUNTER — Encounter: Payer: Self-pay | Admitting: Internal Medicine

## 2022-01-14 ENCOUNTER — Ambulatory Visit (INDEPENDENT_AMBULATORY_CARE_PROVIDER_SITE_OTHER): Payer: Medicare Other | Admitting: Internal Medicine

## 2022-01-14 VITALS — BP 152/68 | HR 46 | Ht 68.0 in | Wt 158.4 lb

## 2022-01-14 DIAGNOSIS — I482 Chronic atrial fibrillation, unspecified: Secondary | ICD-10-CM | POA: Diagnosis not present

## 2022-01-14 DIAGNOSIS — I5022 Chronic systolic (congestive) heart failure: Secondary | ICD-10-CM

## 2022-01-14 DIAGNOSIS — I255 Ischemic cardiomyopathy: Secondary | ICD-10-CM | POA: Diagnosis not present

## 2022-01-14 DIAGNOSIS — R001 Bradycardia, unspecified: Secondary | ICD-10-CM

## 2022-01-14 DIAGNOSIS — Z950 Presence of cardiac pacemaker: Secondary | ICD-10-CM | POA: Diagnosis not present

## 2022-01-14 MED ORDER — TORSEMIDE 20 MG PO TABS: 40.0000 mg | ORAL_TABLET | Freq: Every day | ORAL | 3 refills | Status: DC

## 2022-01-14 NOTE — Patient Instructions (Addendum)
Medication Instructions:  Your physician has recommended you make the following change in your medication:   ** Stop Furosemide  ** Begin Torsemide '20mg'$  - 2 tablets by mouth daily.    *If you need a refill on your cardiac medications before your next appointment, please call your pharmacy*   Lab Work: None ordered.  If you have labs (blood work) drawn today and your tests are completely normal, you will receive your results only by: [place] (if you have MyChart) OR A paper copy in the mail If you have any lab test that is abnormal or we need to change your treatment, we will call you to review the results.   Testing/Procedures: None ordered.   Follow-Up: At [facility], you and your health needs are our priority.  As part of our continuing mission to provide you with exceptional heart care, we have created designated Provider Care Teams.  These Care Teams include your primary Cardiologist (physician) and Advanced Practice Providers (APPs -  Physician Assistants and Nurse Practitioners) who all work together to provide you with the care you need, when you need it.  We recommend signing up for the patient portal called "MyChart".  Sign up information is provided on this After Visit Summary.  MyChart is used to connect with patients for Virtual Visits (Telemedicine).  Patients are able to view lab/test results, encounter notes, upcoming appointments, etc.  Non-urgent messages can be sent to your provider as well.   To learn more about what you can do with MyChart, go to [email or web address].    Your next appointment:   12 months with Dr Caryl Comes  Important Information About Sugar

## 2022-01-14 NOTE — Progress Notes (Signed)
Patient ID: Adam [name], male   DOB: 09-21-1924, 88 y.o.   [name]: [id number]       Patient Care Team: [name], MD as PCP - General (Family Medicine) [name], MD as PCP - Cardiology (Cardiology) [name], MD as Consulting Physician (Cardiology)   HPI  Adam [name] is a 88 y.o. 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  Anticoagulated with Apixoban    Ischemic cardiomyopathy with prior bypass surgery and HFpEF  Previous pt of [name] and now sees [name] for cardiology followup     The patient denies chest pain, shortness of breath, orthopnea .  There have been no palpitations or syncope.  Complains of fatigue and imbalance upon standing.  Orthostatics turned out to be unilluminating (see below) and pain related to his back and neuropathy.  He is also losing vision secondary to macular degeneration.  This is limited his independence, contributing to depression.  This is a big struggle at home.[name]     DATE TEST EF   2015 Echo  25%   8/15 LHC EF 25%  LIMA/SVG-D1,RCA -patent SVG-OM-T  8/17 Echo   35 %         Date Cr K Hgb  6/18 0.94 3.9 12.7  8/19  0.97 4.8 13.0  9/20 1.06 4.1 12.1  1/21 1.00 3.9 12.7   4/23 1.09 3.8 12.1      Past Medical History:  Diagnosis Date   AAA (abdominal aortic aneurysm) (HCC)    Anginal pain (HCC)    Arthritis    CAD (coronary artery disease)    Adam [name] IS CARDIO   CHF (congestive heart failure) (HCC)    Chronic anticoagulation    Diverticulosis    GERD (gastroesophageal reflux disease)    Hyperlipidemia    Hypertension    LBP (low back pain)    Permanent atrial fibrillation (Adam [name])    decision made at [facility] 2015   Presence of permanent cardiac pacemaker    S/P CABG (coronary artery bypass graft)    Shortness of breath dyspnea    Sleep apnea     Past Surgical History:  Procedure Laterality Date    CARDIOVERSION  06/30/2012   Procedure: CARDIOVERSION;  Surgeon: Sinclair Grooms, MD;  Location: [place];  Service: Cardiovascular;  Laterality: N/A;   CARDIOVERSION N/A 11/23/2013   Procedure: CARDIOVERSION;  Surgeon: Sinclair Grooms, MD;  Location: [place];  Service: Cardiovascular;  Laterality: N/A;   CATARACT EXTRACTION W/ INTRAOCULAR LENS  IMPLANT, BILATERAL     CORONARY ARTERY BYPASS GRAFT  Feb 1989   "CABG X6"   ENDOVASCULAR STENT INSERTION  07/28/2011   Procedure: ENDOVASCULAR STENT GRAFT INSERTION;  Surgeon: Hinda Lenis, MD;  Location: [place];  Service: Vascular;  Laterality: N/A;  Pt. on Coumadin/ instructed to hold 5 days prior to procedure   [place] Right 01/04/2015   INGUINAL HERNIA REPAIR Right 01/04/2015   Procedure:  [place];  Surgeon: Fanny Skates, MD;  Location: [place];  Service: General;  Laterality: Right;   INSERT / REPLACE / REMOVE PACEMAKER  July 2008   INSERTION OF MESH Right 01/04/2015   Procedure: INSERTION OF MESH;  Surgeon: Fanny Skates, MD;  Location: [place];  Service: General;  Laterality: Right;   [place]  1996   Bilateral total knee replacements   LEFT HEART CATHETERIZATION WITH CORONARY ANGIOGRAM N/A 03/28/2014   Procedure: LEFT HEART CATHETERIZATION WITH CORONARY ANGIOGRAM;  Surgeon: Sinclair Grooms, MD;  Location: [facility] CATH LAB;  Service: Cardiovascular;  Laterality: N/A;   PERMANENT PACEMAKER GENERATOR CHANGE N/A 11/03/2013   Procedure: PERMANENT PACEMAKER GENERATOR CHANGE;  Surgeon: [name], MD;  Location: [facility] CATH LAB;  Service: Cardiovascular;  Laterality: N/A;   TONSILLECTOMY      Current Outpatient Medications  Medication Sig Dispense Refill   acetaminophen (TYLENOL) 650 MG CR tablet Take 650 mg by mouth every 8 (eight) hours as needed for pain.     allopurinol (ZYLOPRIM) 300 MG tablet Take 300 mg by mouth daily.     apixaban (ELIQUIS) 5 MG TABS  tablet Take 1 tablet (5 mg total) by mouth 2 (two) times daily. 60 tablet 5   atorvastatin (LIPITOR) 10 MG tablet Take 1 tablet (10 mg total) by mouth every evening. 90 tablet 2   Cholecalciferol (VITAMIN D) 2000 UNITS CAPS Take 2,000 Units by mouth daily.     colchicine 0.6 MG tablet Take by mouth.     dapagliflozin propanediol (FARXIGA) 10 MG TABS tablet Take 1 tablet (10 mg total) by mouth daily before breakfast. 90 tablet 3   dorzolamide (TRUSOPT) 2 % ophthalmic solution Place 1 drop into the right eye 2 times daily.     Ferrous Sulfate (IRON PO) Take by mouth.     finasteride (PROSCAR) 5 MG tablet Take 5 mg by mouth daily.     furosemide (LASIX) 80 MG tablet Take by mouth.     latanoprost (XALATAN) 0.005 % ophthalmic solution Place 1 drop into both eyes at bedtime.     Multiple Vitamins-Minerals (PRESERVISION AREDS 2+MULTI VIT PO) Take 1 capsule by mouth 2 (two) times daily.      pregabalin (LYRICA) 75 MG capsule SMARTSIG:1 Capsule(s) By Mouth Every 12 Hours PRN     silodosin (RAPAFLO) 8 MG CAPS capsule Take 8 mg by mouth at bedtime.      VITAMIN D PO Take by mouth.     VITAMIN E PO Take by mouth.     nitroGLYCERIN (NITROSTAT) 0.4 MG SL tablet PLACE 1 TABLET UNDER THE TONGUE EVERY 5 MINUTES AS NEEDED FOR CHEST PAIN (Patient not taking: Reported on 01/14/2022) 25 tablet 5   No current facility-administered medications for this visit.    Allergies  Allergen Reactions   Amlodipine Other (See Comments)    Wife unsure but thinks nausea was reaction   Codeine Other (See Comments)    Nausea per wife   Lisinopril     Elevated potassium   Morphine Nausea Only and Other (See Comments)   Amiodarone Anxiety and Rash    Other reaction(s): NV    Review of Systems negative except from HPI and PMH  Physical Exam: BP (!) 152/68 (BP Location: Left Arm, Patient Position: Sitting, Cuff Size: Normal)   Pulse (!) 46   Ht '5\' 8"'$  (1.727 m)   Wt 158 lb 6.4 oz (71.8 kg)   SpO2 91%   BMI 24.08 kg/m   Well developed and nourished in no acute distress HENT normal Neck supple with JVP-10 + HJR Carotids brisk and full without bruits Clear Irregularly irregular rate and rhythm with controlled ventricular response, no murmurs or gallops Abd-soft with active BS without hepatomegaly No Clubbing cyanosis 2+edema Skin-warm and dry A & Oriented  Grossly normal sensory and motor function  ECG: AF  '@46'$             Intervals-/09/49  Axis 55 occasional paced beat      Assessment and  Plan   Atrial fibrillation-permanent  Bradycardia  Ischemic cardiomyopathy  HFrEF   Pacemaker-[employer]    Orthostatic weakness  Senile [place]   No symptoms of angina.  We will continue him on his statin as well as his apixaban dose is appropriate for his weight and renal function.  Not withstanding the superficial bruising  With his heart failure, he is volume overloaded, not diuresing well with his furosemide, we will change it to torsemide 40.  Continue him on his Farxiga 10.  Not withstanding his cardiomyopathy, he is not a candidate for ACE/ARB/ASNI/MRA Because of hyperkalemia  Heart rate excursion is not super brisk but is reasonable and would like to avoid more pacing because he is already a 20% V pacing at a lower rate of 40  Orthostatics were unimpressive.  Lengthy discussion regarding depression secondary to progressive physical decline with his neuropathy and vision loss.

## 2022-01-15 ENCOUNTER — Telehealth: Payer: Self-pay | Admitting: Internal Medicine

## 2022-01-15 NOTE — Telephone Encounter (Signed)
New Message:     Patient was seen by Dr Caryl Comes on 01-14-22. Patient would like a paper summary of that office visit please,

## 2022-01-20 ENCOUNTER — Telehealth: Payer: Self-pay | Admitting: Internal Medicine

## 2022-01-20 NOTE — Telephone Encounter (Signed)
Patient's wife is calling bout medtronic monitor and says that she have to mail it off but for some reason it is not letting her mail it off. Please call back to discuss

## 2022-01-20 NOTE — Telephone Encounter (Signed)
Pt's wife called back and said to disregard last message because they found what they needed to find.

## 2022-01-21 DIAGNOSIS — D692 Other nonthrombocytopenic purpura: Secondary | ICD-10-CM | POA: Diagnosis not present

## 2022-01-21 DIAGNOSIS — L570 Actinic keratosis: Secondary | ICD-10-CM | POA: Diagnosis not present

## 2022-01-21 DIAGNOSIS — Z85828 Personal history of other malignant neoplasm of skin: Secondary | ICD-10-CM | POA: Diagnosis not present

## 2022-01-21 DIAGNOSIS — L812 Freckles: Secondary | ICD-10-CM | POA: Diagnosis not present

## 2022-01-21 DIAGNOSIS — L218 Other seborrheic dermatitis: Secondary | ICD-10-CM | POA: Diagnosis not present

## 2022-01-21 DIAGNOSIS — L820 Inflamed seborrheic keratosis: Secondary | ICD-10-CM | POA: Diagnosis not present

## 2022-01-21 DIAGNOSIS — L821 Other seborrheic keratosis: Secondary | ICD-10-CM | POA: Diagnosis not present

## 2022-02-10 ENCOUNTER — Encounter: Payer: Self-pay | Admitting: Podiatry

## 2022-02-10 ENCOUNTER — Ambulatory Visit (INDEPENDENT_AMBULATORY_CARE_PROVIDER_SITE_OTHER): Payer: Medicare Other | Admitting: Podiatry

## 2022-02-10 DIAGNOSIS — M79676 Pain in unspecified toe(s): Secondary | ICD-10-CM | POA: Diagnosis not present

## 2022-02-10 DIAGNOSIS — D2371 Other benign neoplasm of skin of right lower limb, including hip: Secondary | ICD-10-CM | POA: Diagnosis not present

## 2022-02-10 DIAGNOSIS — D2372 Other benign neoplasm of skin of left lower limb, including hip: Secondary | ICD-10-CM | POA: Diagnosis not present

## 2022-02-10 DIAGNOSIS — B351 Tinea unguium: Secondary | ICD-10-CM

## 2022-02-10 DIAGNOSIS — D689 Coagulation defect, unspecified: Secondary | ICD-10-CM

## 2022-02-11 DIAGNOSIS — H401132 Primary open-angle glaucoma, bilateral, moderate stage: Secondary | ICD-10-CM | POA: Diagnosis not present

## 2022-02-12 ENCOUNTER — Ambulatory Visit: Payer: Medicare Other | Admitting: Podiatry

## 2022-02-25 ENCOUNTER — Ambulatory Visit (INDEPENDENT_AMBULATORY_CARE_PROVIDER_SITE_OTHER): Payer: Medicare Other

## 2022-02-25 DIAGNOSIS — I255 Ischemic cardiomyopathy: Secondary | ICD-10-CM | POA: Diagnosis not present

## 2022-02-25 LAB — CUP PACEART REMOTE DEVICE CHECK
Battery Impedance: 2285 Ohm
Battery Remaining Longevity: 33 mo
Battery Voltage: 2.74 V
Brady Statistic RV Percent Paced: 25 %
Date Time Interrogation Session: 20230712123340
Implantable Lead Implant Date: 20080818
Implantable Lead Implant Date: 20080818
Implantable Lead Location: 753859
Implantable Lead Location: 753860
Implantable Lead Model: 4469
Implantable Lead Model: 4470
Implantable Lead Serial Number: 499409
Implantable Lead Serial Number: 602985
Implantable Pulse Generator Implant Date: 20150320
Lead Channel Impedance Value: 480 Ohm
Lead Channel Impedance Value: 67 Ohm
Lead Channel Pacing Threshold Amplitude: 1.5 V
Lead Channel Pacing Threshold Pulse Width: 0.4 ms
Lead Channel Setting Pacing Amplitude: 3 V
Lead Channel Setting Pacing Pulse Width: 0.4 ms
Lead Channel Setting Sensing Sensitivity: 5.6 mV

## 2022-03-06 DIAGNOSIS — S51011A Laceration without foreign body of right elbow, initial encounter: Secondary | ICD-10-CM | POA: Diagnosis not present

## 2022-03-06 DIAGNOSIS — S51012A Laceration without foreign body of left elbow, initial encounter: Secondary | ICD-10-CM | POA: Diagnosis not present

## 2022-03-06 DIAGNOSIS — W19XXXA Unspecified fall, initial encounter: Secondary | ICD-10-CM | POA: Diagnosis not present

## 2022-03-10 DIAGNOSIS — M5416 Radiculopathy, lumbar region: Secondary | ICD-10-CM | POA: Diagnosis not present

## 2022-03-16 DIAGNOSIS — R3911 Hesitancy of micturition: Secondary | ICD-10-CM | POA: Diagnosis not present

## 2022-03-16 DIAGNOSIS — R600 Localized edema: Secondary | ICD-10-CM | POA: Diagnosis not present

## 2022-03-16 DIAGNOSIS — H6123 Impacted cerumen, bilateral: Secondary | ICD-10-CM | POA: Diagnosis not present

## 2022-03-16 DIAGNOSIS — M791 Myalgia, unspecified site: Secondary | ICD-10-CM | POA: Diagnosis not present

## 2022-03-16 DIAGNOSIS — R351 Nocturia: Secondary | ICD-10-CM | POA: Diagnosis not present

## 2022-03-16 DIAGNOSIS — R0602 Shortness of breath: Secondary | ICD-10-CM | POA: Diagnosis not present

## 2022-03-16 NOTE — Progress Notes (Signed)
Remote pacemaker transmission.   

## 2022-03-18 ENCOUNTER — Telehealth: Payer: Self-pay | Admitting: Interventional Cardiology

## 2022-03-18 NOTE — Telephone Encounter (Signed)
Spoke with patient's wife informed her that patients pacemaker is functioning normally, patients PPM is set at 40bpm, and at time of transmission patients HR was 50bpm, informed patients wife that with patient who are in permanent AF the pulse oximeter that is used does not give an accurate HR when a patient is in AF informed patients wife that the best way to get an accurate HR for a patient in AF is and EKG or to apical auscultation of HR patients wife voiced understanding

## 2022-03-18 NOTE — Telephone Encounter (Signed)
STAT if HR is under 50 or over 120 (normal HR is 60-100 beats per minute)  What is your heart rate? not sure what HR is now, 33 a few hours ago  Do you have a log of your heart rate readings (document readings)? 33, Monday states it was around 34  Do you have any other symptoms? Fatigue, breathing harder  Patient's wife states the patient saw a doctor today and had a HR of 33. She says this was a few hours ago and he does not have a way to check it now. She says Monday he saw his PCP and it was around 34. She says he has also had fatigue and has been breathing harder.

## 2022-03-18 NOTE — Telephone Encounter (Signed)
Patient called in stating heart rate is 33 and is going to send in a transmission and would like a call back from a nurse to go over it

## 2022-03-19 DIAGNOSIS — L821 Other seborrheic keratosis: Secondary | ICD-10-CM | POA: Diagnosis not present

## 2022-03-19 DIAGNOSIS — Z85828 Personal history of other malignant neoplasm of skin: Secondary | ICD-10-CM | POA: Diagnosis not present

## 2022-03-19 DIAGNOSIS — L218 Other seborrheic dermatitis: Secondary | ICD-10-CM | POA: Diagnosis not present

## 2022-03-19 DIAGNOSIS — M7981 Nontraumatic hematoma of soft tissue: Secondary | ICD-10-CM | POA: Diagnosis not present

## 2022-04-17 DIAGNOSIS — W19XXXA Unspecified fall, initial encounter: Secondary | ICD-10-CM | POA: Diagnosis not present

## 2022-04-17 DIAGNOSIS — T148XXA Other injury of unspecified body region, initial encounter: Secondary | ICD-10-CM | POA: Diagnosis not present

## 2022-04-17 DIAGNOSIS — S81812A Laceration without foreign body, left lower leg, initial encounter: Secondary | ICD-10-CM | POA: Diagnosis not present

## 2022-04-17 DIAGNOSIS — S41112A Laceration without foreign body of left upper arm, initial encounter: Secondary | ICD-10-CM | POA: Diagnosis not present

## 2022-04-21 DIAGNOSIS — S8002XD Contusion of left knee, subsequent encounter: Secondary | ICD-10-CM | POA: Diagnosis not present

## 2022-04-21 DIAGNOSIS — S81812D Laceration without foreign body, left lower leg, subsequent encounter: Secondary | ICD-10-CM | POA: Diagnosis not present

## 2022-04-24 DIAGNOSIS — S41112A Laceration without foreign body of left upper arm, initial encounter: Secondary | ICD-10-CM | POA: Diagnosis not present

## 2022-04-24 DIAGNOSIS — S81812A Laceration without foreign body, left lower leg, initial encounter: Secondary | ICD-10-CM | POA: Diagnosis not present

## 2022-04-24 DIAGNOSIS — T148XXA Other injury of unspecified body region, initial encounter: Secondary | ICD-10-CM | POA: Diagnosis not present

## 2022-04-27 DIAGNOSIS — S81802D Unspecified open wound, left lower leg, subsequent encounter: Secondary | ICD-10-CM | POA: Diagnosis not present

## 2022-04-27 DIAGNOSIS — S8002XD Contusion of left knee, subsequent encounter: Secondary | ICD-10-CM | POA: Diagnosis not present

## 2022-04-28 ENCOUNTER — Telehealth: Payer: Self-pay | Admitting: Interventional Cardiology

## 2022-04-28 NOTE — Telephone Encounter (Signed)
Pt c/o Shortness Of Breath: STAT if SOB developed within the last 24 hours or pt is noticeably SOB on the phone  1. Are you currently SOB (can you hear that pt is SOB on the phone)? No  2. How long have you been experiencing SOB? Days or weeks the wife states   3. Are you SOB when sitting or when up moving around? Both   4. Are you currently experiencing any other symptoms? Weak and tires easily.

## 2022-04-28 NOTE — Telephone Encounter (Signed)
For the past several weeks patient has been getting exhausted and huffing and puffing after getting up to do anything. Patient's current symptoms are SOB with activity, has worsen in the last couple of weeks, swollen LE, not new, and has history of falls, seen by urgent care for wounds from fall. Patient's wife stated he also has leg pain due to neuropathy. She stated they check daily weights, but he has lost weight of 7 lbs over the last 2 to 3 months. Made patient an appointment to see APP, next available. Patient's wife verbalized understanding.

## 2022-05-04 DIAGNOSIS — S8002XD Contusion of left knee, subsequent encounter: Secondary | ICD-10-CM | POA: Diagnosis not present

## 2022-05-04 DIAGNOSIS — S81802D Unspecified open wound, left lower leg, subsequent encounter: Secondary | ICD-10-CM | POA: Diagnosis not present

## 2022-05-11 NOTE — Progress Notes (Unsigned)
Office Visit    Patient Name: Adam [name] Date of Encounter: 05/13/2022  Primary Care Provider:  [name], MD Primary Cardiologist:  Sinclair Grooms, MD Primary Electrophysiologist: None  Chief Complaint    Adam [name] is a 88 y.o. male with PMH of CAD s/p CABG x6 (bypass graft failure saphenous vein to the obtuse margin) in the late 80s, ICM, tachybradycardia syndrome s/p PPM 2008 (Medtronic), AAA, chronic systolic CHF, chronic AF, HTN, HLD who presents today with shortness of breath with activity.  Past Medical History    Past Medical History:  Diagnosis Date   AAA (abdominal aortic aneurysm) (HCC)    Anginal pain (HCC)    Arthritis    CAD (coronary artery disease)    [name]   CHF (congestive heart failure) (HCC)    Chronic anticoagulation    Diverticulosis    GERD (gastroesophageal reflux disease)    Hyperlipidemia    Hypertension    LBP (low back pain)    Permanent atrial fibrillation ([place])    decision made at [facility] 2015   Presence of permanent cardiac pacemaker    S/P CABG (coronary artery bypass graft)    Shortness of breath dyspnea    Sleep apnea    Past Surgical History:  Procedure Laterality Date   CARDIOVERSION  06/30/2012   Procedure: CARDIOVERSION;  Surgeon: Sinclair Grooms, MD;  Location: [place];  Service: Cardiovascular;  Laterality: N/A;   CARDIOVERSION N/A 11/23/2013   Procedure: CARDIOVERSION;  Surgeon: Sinclair Grooms, MD;  Location: [place];  Service: Cardiovascular;  Laterality: N/A;   CATARACT EXTRACTION W/ INTRAOCULAR LENS  IMPLANT, BILATERAL     CORONARY ARTERY BYPASS GRAFT  Feb 1989   "CABG X6"   ENDOVASCULAR STENT INSERTION  07/28/2011   Procedure: ENDOVASCULAR STENT GRAFT INSERTION;  Surgeon: Hinda Lenis, MD;  Location: [place];  Service: Vascular;  Laterality: N/A;  Pt. on Coumadin/ instructed to hold 5 days prior to procedure   [place] Right 01/04/2015    INGUINAL HERNIA REPAIR Right 01/04/2015   Procedure:  [place];  Surgeon: Fanny Skates, MD;  Location: [place];  Service: General;  Laterality: Right;   INSERT / REPLACE / REMOVE PACEMAKER  July 2008   INSERTION OF MESH Right 01/04/2015   Procedure: INSERTION OF MESH;  Surgeon: Fanny Skates, MD;  Location: [place];  Service: General;  Laterality: Right;   [place]   Bilateral total knee replacements   LEFT HEART CATHETERIZATION WITH CORONARY ANGIOGRAM N/A 03/28/2014   Procedure: LEFT HEART CATHETERIZATION WITH CORONARY ANGIOGRAM;  Surgeon: Sinclair Grooms, MD;  Location: [facility] CATH LAB;  Service: Cardiovascular;  Laterality: N/A;   PERMANENT PACEMAKER GENERATOR CHANGE N/A 11/03/2013   Procedure: PERMANENT PACEMAKER GENERATOR CHANGE;  Surgeon: [name], MD;  Location: [facility] CATH LAB;  Service: Cardiovascular;  Laterality: N/A;   TONSILLECTOMY      Allergies  Allergies  Allergen Reactions   Amlodipine Other (See Comments)    Wife unsure but thinks nausea was reaction   Codeine Other (See Comments)    Nausea per wife   Lisinopril     Elevated potassium   Morphine Nausea Only and Other (See Comments)   Amiodarone Anxiety and Rash    Other reaction(s): NV    History of Present Illness    Adam [name]  is a 88 year old male no with the above mention past medical history who presents today for shortness of breath with activity.  Adam [name] was initially seen by Dr. [name] in 2012 when he was cardioverted for recurrent atrial fibrillation.  2D echo completed in 2017 with EF of 35%.  He was most recently seen by Dr. [name] and 10/2021 for follow-up.  During visit patient was euvolemic and rate was controlled. Adam [name] presents today with his wife for follow-up of shortness of breath with exertion.  Since last being seen in the office patient reports that he is not had any chest pain or palpitations.  He does endorse mild shortness of breath with  exertion.  This is not associated with any increase fluid volume.  Patient is euvolemic on examination's and weights have been consistent for the last 5 months.  Patient and wife both report that he is not been very active and recently had a fall 2 months ago without any head injury.  He is compliant with current medications and denies any adverse medication reactions.  Patient denies chest pain, palpitations, dyspnea, PND, orthopnea, nausea, vomiting, dizziness, syncope, edema, weight gain, or early satiety.   Home Medications    Current Outpatient Medications  Medication Sig Dispense Refill   acetaminophen (TYLENOL) 650 MG CR tablet Take 650 mg by mouth every 8 (eight) hours as needed for pain.     allopurinol (ZYLOPRIM) 100 MG tablet SMARTSIG:1 Tablet(s) By Mouth     allopurinol (ZYLOPRIM) 300 MG tablet Take 300 mg by mouth daily.     apixaban (ELIQUIS) 5 MG TABS tablet Take 1 tablet (5 mg total) by mouth 2 (two) times daily. 60 tablet 5   atorvastatin (LIPITOR) 10 MG tablet Take 1 tablet (10 mg total) by mouth every evening. 90 tablet 2   Cholecalciferol (VITAMIN D) 2000 UNITS CAPS Take 2,000 Units by mouth daily.     colchicine 0.6 MG tablet Take by mouth.     dapagliflozin propanediol (FARXIGA) 10 MG TABS tablet Take 1 tablet (10 mg total) by mouth daily before breakfast. 90 tablet 3   dorzolamide (TRUSOPT) 2 % ophthalmic solution Place 1 drop into the right eye 2 times daily.     Ferrous Sulfate (IRON PO) Take by mouth.     finasteride (PROSCAR) 5 MG tablet Take 5 mg by mouth daily.     fluocinonide (LIDEX) 0.05 % external solution SMARTSIG:1 Milliliter(s) Topical Twice Daily PRN     latanoprost (XALATAN) 0.005 % ophthalmic solution Place 1 drop into both eyes at bedtime.     LYRICA 100 MG capsule SMARTSIG:1 Capsule(s) By Mouth     Multiple Vitamins-Minerals (PRESERVISION AREDS 2+MULTI VIT PO) Take 1 capsule by mouth 2 (two) times daily.      nitroGLYCERIN (NITROSTAT) 0.4 MG SL tablet  PLACE 1 TABLET UNDER THE TONGUE EVERY 5 MINUTES AS NEEDED FOR CHEST PAIN 25 tablet 5   pregabalin (LYRICA) 75 MG capsule SMARTSIG:1 Capsule(s) By Mouth Every 12 Hours PRN     silodosin (RAPAFLO) 8 MG CAPS capsule Take 8 mg by mouth at bedtime.      torsemide (DEMADEX) 20 MG tablet Take 2 tablets (40 mg total) by mouth daily. 180 tablet 3   VITAMIN D PO Take by mouth.     VITAMIN E PO Take by mouth.     metoprolol succinate (TOPROL-XL) 25 MG 24 hr tablet  (Patient not taking: Reported on 05/13/2022)     No current facility-administered  medications for this visit.     Review of Systems  Please see the history of present illness.    (+) Shortness of breath with exertion (+) Chronic neuropathy  All other systems reviewed and are otherwise negative except as noted above.  Physical Exam    Wt Readings from Last 3 Encounters:  05/13/22 158 lb 3.2 oz (71.8 kg)  01/14/22 158 lb 6.4 oz (71.8 kg)  11/25/21 155 lb (70.3 kg)   VS: Vitals:   05/13/22 1031  BP: (!) 140/68  Pulse: (!) 53  SpO2: 97%  ,Body mass index is 24.05 kg/m.  Constitutional:      Appearance: Elderly none completed today appearance. Not in distress.  Neck:     Vascular: JVD normal.  Pulmonary:     Effort: Pulmonary effort is normal.     Breath sounds: No wheezing. No rales. Diminished in the bases Cardiovascular:     Normal rate. Regular rhythm. Normal S1. Normal S2.      Murmurs: There is no murmur.  Edema:    Peripheral edema absent.  Abdominal:     Palpations: Abdomen is soft non tender. There is no hepatomegaly.  Skin:    General: Skin is warm and dry.  Neurological:     General: No focal deficit present.     Mental Status: Alert and oriented to person, place and time.     Cranial Nerves: Cranial nerves are intact.  EKG/LABS/Other Studies Reviewed    ECG personally reviewed by me today -none completed today  Risk Assessment/Calculations:    CHA2DS2-VASc Score = 5   This indicates a 7.2% annual  risk of stroke. The patient's score is based upon: CHF History: 1 HTN History: 1 Diabetes History: 0 Stroke History: 0 Vascular Disease History: 1 Age Score: 2 Gender Score: 0      Lab Results  Component Value Date   WBC 10.6 11/03/2021   HGB 12.1 (L) 11/03/2021   HCT 37.1 (L) 11/03/2021   MCV 97 11/03/2021   PLT 130 (L) 11/03/2021   Lab Results  Component Value Date   CREATININE 1.09 11/24/2021   BUN 33 11/24/2021   NA 142 11/24/2021   K 3.8 11/24/2021   CL 103 11/24/2021   CO2 27 11/24/2021   Lab Results  Component Value Date   ALT 13 01/03/2018   AST 24 01/03/2018   ALKPHOS 67 01/03/2018   BILITOT 0.5 01/03/2018   Lab Results  Component Value Date   CHOL 138 01/03/2018   HDL 53 01/03/2018   LDLCALC 68 01/03/2018   TRIG 86 01/03/2018   CHOLHDL 2.6 01/03/2018    Lab Results  Component Value Date   HGBA1C 6.2 (H) 08/20/2020    Assessment & Plan    1.  Chronic systolic CHF/ICM: -2D echo completed in 2017 with EF of 35% -Today patient reports increased shortness of breath with exertion -Patient is euvolemic on examination today. -Continue current GDMT with Farxiga 10 mg, lisinopril 2.5 mg, and -Was changed to torsemide 40 mg during most recent visit with Dr. Caryl Comes -Patient was advised to take extra half tab of Demadex if weights increase by 2 pounds in 24 hours or 5 pounds in 1 week. Low sodium diet, fluid restriction <2L, and daily weights encouraged. Educated to contact our office for weight gain of 2 lbs overnight or 5 lbs in one week.   2.  Permanent atrial fibrillation: -Patient currently rate controlled with heart rate of 53 -Anticoagulated with Eliquis -Patient  no longer on Tikosyn  3.  History of tachybradycardia syndrome: s/p PPM placed 2018 -Currently followed by Dr. Lennette Bihari report WNL  4.  Coronary artery disease: -s/p CABG x6 (bypass graft failure saphenous vein to the obtuse margin) in the late 80 -Today patient reports no chest  pain or angina -Continue GDMT with Lipitor 10 mg, ASA therapy due to Eliquis  5.  Dyspnea on exertion: -Today patient reports shortness of breath with activity -Shortness of breath may be related to deconditioning due to decreased activity level. -Patient is euvolemic on examination.      Disposition: Follow-up with Sinclair Grooms, MD as scheduled    Medication Adjustments/Labs and Tests Ordered: Current medicines are reviewed at length with the patient today.  Concerns regarding medicines are outlined above.   Signed, Mable Fill, Marissa Nestle, NP 05/13/2022, 12:30 PM [facility]  Note:  This document was prepared using Dragon voice recognition software and may include unintentional dictation errors.

## 2022-05-13 ENCOUNTER — Encounter: Payer: Self-pay | Admitting: Nurse Practitioner

## 2022-05-13 ENCOUNTER — Ambulatory Visit: Payer: Medicare Other | Attending: Nurse Practitioner | Admitting: Nurse Practitioner

## 2022-05-13 VITALS — BP 140/68 | HR 53 | Ht 68.0 in | Wt 158.2 lb

## 2022-05-13 DIAGNOSIS — I495 Sick sinus syndrome: Secondary | ICD-10-CM | POA: Diagnosis not present

## 2022-05-13 DIAGNOSIS — I25810 Atherosclerosis of coronary artery bypass graft(s) without angina pectoris: Secondary | ICD-10-CM | POA: Insufficient documentation

## 2022-05-13 DIAGNOSIS — I5022 Chronic systolic (congestive) heart failure: Secondary | ICD-10-CM | POA: Insufficient documentation

## 2022-05-13 DIAGNOSIS — R0609 Other forms of dyspnea: Secondary | ICD-10-CM | POA: Diagnosis not present

## 2022-05-13 DIAGNOSIS — I482 Chronic atrial fibrillation, unspecified: Secondary | ICD-10-CM | POA: Diagnosis not present

## 2022-05-13 NOTE — Patient Instructions (Signed)
Medication Instructions May take an extra 1/2 torsemide for weight up 2 lbs in a 24 hour preiod or 5 lbs in a week.  *If you need a refill on your cardiac medications before your next appointment, please call your pharmacy*   Lab Work:  If you have labs (blood work) drawn today and your tests are completely normal, you will receive your results only by: [place] (if you have MyChart) OR A paper copy in the mail If you have any lab test that is abnormal or we need to change your treatment, we will call you to review the results.   Testing/Procedures:    Follow-Up: At [facility], you and your health needs are our priority.  As part of our continuing mission to provide you with exceptional heart care, we have created designated Provider Care Teams.  These Care Teams include your primary Cardiologist (physician) and Advanced Practice Providers (APPs -  Physician Assistants and Nurse Practitioners) who all work together to provide you with the care you need, when you need it.  We recommend signing up for the patient portal called "MyChart".  Sign up information is provided on this After Visit Summary.  MyChart is used to connect with patients for Virtual Visits (Telemedicine).  Patients are able to view lab/test results, encounter notes, upcoming appointments, etc.  Non-urgent messages can be sent to your provider as well.   To learn more about what you can do with MyChart, go to [email or web address].     Important Information About Sugar

## 2022-05-14 ENCOUNTER — Encounter: Payer: Self-pay | Admitting: Podiatry

## 2022-05-14 ENCOUNTER — Ambulatory Visit (INDEPENDENT_AMBULATORY_CARE_PROVIDER_SITE_OTHER): Payer: Medicare Other | Admitting: Podiatry

## 2022-05-14 DIAGNOSIS — D2371 Other benign neoplasm of skin of right lower limb, including hip: Secondary | ICD-10-CM

## 2022-05-14 DIAGNOSIS — D689 Coagulation defect, unspecified: Secondary | ICD-10-CM

## 2022-05-14 DIAGNOSIS — B351 Tinea unguium: Secondary | ICD-10-CM

## 2022-05-14 DIAGNOSIS — L219 Seborrheic dermatitis, unspecified: Secondary | ICD-10-CM | POA: Insufficient documentation

## 2022-05-14 DIAGNOSIS — D2372 Other benign neoplasm of skin of left lower limb, including hip: Secondary | ICD-10-CM

## 2022-05-14 DIAGNOSIS — N400 Enlarged prostate without lower urinary tract symptoms: Secondary | ICD-10-CM | POA: Insufficient documentation

## 2022-05-14 DIAGNOSIS — G629 Polyneuropathy, unspecified: Secondary | ICD-10-CM | POA: Insufficient documentation

## 2022-05-14 DIAGNOSIS — Z461 Encounter for fitting and adjustment of hearing aid: Secondary | ICD-10-CM | POA: Insufficient documentation

## 2022-05-14 DIAGNOSIS — J310 Chronic rhinitis: Secondary | ICD-10-CM | POA: Insufficient documentation

## 2022-05-14 DIAGNOSIS — H903 Sensorineural hearing loss, bilateral: Secondary | ICD-10-CM | POA: Insufficient documentation

## 2022-05-14 DIAGNOSIS — M159 Polyosteoarthritis, unspecified: Secondary | ICD-10-CM | POA: Insufficient documentation

## 2022-05-14 DIAGNOSIS — M79676 Pain in unspecified toe(s): Secondary | ICD-10-CM

## 2022-05-14 DIAGNOSIS — Z96659 Presence of unspecified artificial knee joint: Secondary | ICD-10-CM | POA: Insufficient documentation

## 2022-05-16 NOTE — Progress Notes (Signed)
He presents today chief concern of painful elongated toenails.  Objective: Pulses are palpable.  His nails are long thick yellow dystrophic-like mycotic tender on palpation.  Assessment: Pain in limb secondary onychomycosis.  Plan: Debridement of toenails 1 through 5 bilateral follow-up with me as needed.

## 2022-05-27 ENCOUNTER — Ambulatory Visit (INDEPENDENT_AMBULATORY_CARE_PROVIDER_SITE_OTHER): Payer: Medicare Other

## 2022-05-27 DIAGNOSIS — I255 Ischemic cardiomyopathy: Secondary | ICD-10-CM | POA: Diagnosis not present

## 2022-05-28 LAB — CUP PACEART REMOTE DEVICE CHECK
Battery Impedance: 2419 Ohm
Battery Remaining Longevity: 31 mo
Battery Voltage: 2.74 V
Brady Statistic RV Percent Paced: 25 %
Date Time Interrogation Session: 20231012105044
Implantable Lead Implant Date: 20080818
Implantable Lead Implant Date: 20080818
Implantable Lead Location: 753859
Implantable Lead Location: 753860
Implantable Lead Model: 4469
Implantable Lead Model: 4470
Implantable Lead Serial Number: 499409
Implantable Lead Serial Number: 602985
Implantable Pulse Generator Implant Date: 20150320
Lead Channel Impedance Value: 483 Ohm
Lead Channel Impedance Value: 67 Ohm
Lead Channel Pacing Threshold Amplitude: 1.25 V
Lead Channel Pacing Threshold Pulse Width: 0.4 ms
Lead Channel Setting Pacing Amplitude: 2.5 V
Lead Channel Setting Pacing Pulse Width: 0.4 ms
Lead Channel Setting Sensing Sensitivity: 5.6 mV

## 2022-05-29 ENCOUNTER — Telehealth: Payer: Self-pay | Admitting: Interventional Cardiology

## 2022-05-29 ENCOUNTER — Other Ambulatory Visit: Payer: Self-pay

## 2022-05-29 DIAGNOSIS — I482 Chronic atrial fibrillation, unspecified: Secondary | ICD-10-CM

## 2022-05-29 MED ORDER — APIXABAN 5 MG PO TABS: 5.0000 mg | ORAL_TABLET | Freq: Two times a day (BID) | ORAL | 1 refills | Status: DC

## 2022-05-29 NOTE — Telephone Encounter (Signed)
*  STAT* If patient is at the pharmacy, call can be transferred to refill team.   1. Which medications need to be refilled? (please list name of each medication and dose if known)  apixaban (ELIQUIS) 5 MG TABS tablet  2. Which pharmacy/location (including street and city if local pharmacy) is medication to be sent to? [place], [place] - [address]  3. Do they need a 30 day or 90 day supply?  90 day supply

## 2022-05-29 NOTE — Telephone Encounter (Signed)
Prescription refill request for Eliquis received. Indication:Afib Last office visit:9/23 Scr:1.0 Age: 88 Weight:71.8  kg  Prescription refilled

## 2022-06-04 DIAGNOSIS — Z23 Encounter for immunization: Secondary | ICD-10-CM | POA: Diagnosis not present

## 2022-06-09 NOTE — Progress Notes (Signed)
Remote pacemaker transmission.   

## 2022-06-21 DIAGNOSIS — Z23 Encounter for immunization: Secondary | ICD-10-CM | POA: Diagnosis not present

## 2022-06-30 DIAGNOSIS — R3912 Poor urinary stream: Secondary | ICD-10-CM | POA: Diagnosis not present

## 2022-06-30 DIAGNOSIS — N401 Enlarged prostate with lower urinary tract symptoms: Secondary | ICD-10-CM | POA: Diagnosis not present

## 2022-07-02 DIAGNOSIS — H43813 Vitreous degeneration, bilateral: Secondary | ICD-10-CM | POA: Diagnosis not present

## 2022-07-02 DIAGNOSIS — H401132 Primary open-angle glaucoma, bilateral, moderate stage: Secondary | ICD-10-CM | POA: Diagnosis not present

## 2022-07-02 DIAGNOSIS — Z961 Presence of intraocular lens: Secondary | ICD-10-CM | POA: Diagnosis not present

## 2022-07-02 DIAGNOSIS — H353133 Nonexudative age-related macular degeneration, bilateral, advanced atrophic without subfoveal involvement: Secondary | ICD-10-CM | POA: Diagnosis not present

## 2022-07-20 DIAGNOSIS — H401132 Primary open-angle glaucoma, bilateral, moderate stage: Secondary | ICD-10-CM | POA: Diagnosis not present

## 2022-07-27 ENCOUNTER — Ambulatory Visit: Payer: Medicare Other | Admitting: Interventional Cardiology

## 2022-08-02 NOTE — Progress Notes (Unsigned)
Cardiology Office Note:    Date:  08/03/2022   ID:  Adam [name], DOB [birth year], [name] [id number]  PCP:  [name], MD  Cardiologist:  Sinclair Grooms, MD   Referring MD: [name], MD   Chief Complaint  Patient presents with   Congestive Heart Failure   Atrial Fibrillation    History of Present Illness:    Adam [name] is a 88 y.o. male with a hx of CAD s/p CABG x6 (bypass graft failure saphenous vein to the obtuse margin) in the late 80s, ICM, tachybradycardia syndrome s/p PPM 2008 ([employer]tr[employer]), AAA, chronic systolic CHF, chronic AF, HTN, HLD, and multifactorial shortness of breath.  Recently seen by Ambrose Pancoast in late September/early October.  Demadex sliding scale parameters were given at the last visit.  In May, furosemide was discontinued and Demadex 40 mg/day started.  This was done by Dr. Caryl Comes.  The addition of Demadex is a problem although no dramatic decrease in peripheral edema.  Breathing has improved.  He has felt somewhat tired and weak.  He is able to lie flat.  He had a recent fall injuring his right side.  Still bothers him.  He did not hit his head.  Past Medical History:  Diagnosis Date   AAA (abdominal aortic aneurysm) (HCC)    Anginal pain (HCC)    Arthritis    CAD (coronary artery disease)    HANK Saffron Busey IS CARDIO   CHF (congestive heart failure) (HCC)    Chronic anticoagulation    Diverticulosis    GERD (gastroesophageal reflux disease)    Hyperlipidemia    Hypertension    LBP (low back pain)    Permanent atrial fibrillation ([place])    decision made at [facility] 2015   Presence of permanent cardiac pacemaker    S/P CABG (coronary artery bypass graft)    Shortness of breath dyspnea    Sleep apnea     Past Surgical History:  Procedure Laterality Date   CARDIOVERSION  06/30/2012   Procedure: CARDIOVERSION;  Surgeon: Sinclair Grooms, MD;  Location: [place];  Service: Cardiovascular;  Laterality: N/A;   CARDIOVERSION N/A  11/23/2013   Procedure: CARDIOVERSION;  Surgeon: Sinclair Grooms, MD;  Location: [place];  Service: Cardiovascular;  Laterality: N/A;   CATARACT EXTRACTION W/ INTRAOCULAR LENS  IMPLANT, BILATERAL     CORONARY ARTERY BYPASS GRAFT  Feb 1989   "CABG X6"   ENDOVASCULAR STENT INSERTION  07/28/2011   Procedure: ENDOVASCULAR STENT GRAFT INSERTION;  Surgeon: Hinda Lenis, MD;  Location: [place];  Service: Vascular;  Laterality: N/A;  Pt. on Coumadin/ instructed to hold 5 days prior to procedure   [place] Right 01/04/2015   INGUINAL HERNIA REPAIR Right 01/04/2015   Procedure:  [place];  Surgeon: Fanny Skates, MD;  Location: [place];  Service: General;  Laterality: Right;   INSERT / REPLACE / REMOVE PACEMAKER  July 2008   INSERTION OF MESH Right 01/04/2015   Procedure: INSERTION OF MESH;  Surgeon: Fanny Skates, MD;  Location: [place];  Service: General;  Laterality: Right;   [place]   Bilateral total knee replacements   LEFT HEART CATHETERIZATION WITH CORONARY ANGIOGRAM N/A 03/28/2014   Procedure: LEFT HEART CATHETERIZATION WITH CORONARY ANGIOGRAM;  Surgeon: Sinclair Grooms, MD;  Location: [place] CATH LAB;  Service: Cardiovascular;  Laterality: N/A;   PERMANENT  PACEMAKER GENERATOR CHANGE N/A 11/03/2013   Procedure: PERMANENT PACEMAKER GENERATOR CHANGE;  Surgeon: [name], MD;  Location: [facility] CATH LAB;  Service: Cardiovascular;  Laterality: N/A;   TONSILLECTOMY      Current Medications: Current Meds  Medication Sig   acetaminophen (TYLENOL) 650 MG CR tablet Take 650 mg by mouth every 8 (eight) hours as needed for pain.   allopurinol (ZYLOPRIM) 100 MG tablet SMARTSIG:1 Tablet(s) By Mouth   allopurinol (ZYLOPRIM) 300 MG tablet Take 300 mg by mouth daily.   apixaban (ELIQUIS) 5 MG TABS tablet Take 1 tablet (5 mg total) by mouth 2 (two) times daily.   atorvastatin (LIPITOR) 10 MG tablet Take 1 tablet (10 mg total)  by mouth every evening.   Cholecalciferol (VITAMIN D) 2000 UNITS CAPS Take 2,000 Units by mouth daily.   colchicine 0.6 MG tablet Take by mouth.   dapagliflozin propanediol (FARXIGA) 10 MG TABS tablet Take 1 tablet (10 mg total) by mouth daily before breakfast.   dorzolamide (TRUSOPT) 2 % ophthalmic solution Place 1 drop into the right eye 2 times daily.   Ferrous Sulfate (IRON PO) Take by mouth.   finasteride (PROSCAR) 5 MG tablet Take 5 mg by mouth daily.   fluocinonide (LIDEX) 0.05 % external solution SMARTSIG:1 Milliliter(s) Topical Twice Daily PRN   latanoprost (XALATAN) 0.005 % ophthalmic solution Place 1 drop into both eyes at bedtime.   nitroGLYCERIN (NITROSTAT) 0.4 MG SL tablet PLACE 1 TABLET UNDER THE TONGUE EVERY 5 MINUTES AS NEEDED FOR CHEST PAIN   pregabalin (LYRICA) 75 MG capsule SMARTSIG:1 Capsule(s) By Mouth Every 12 Hours PRN   silodosin (RAPAFLO) 8 MG CAPS capsule Take 8 mg by mouth at bedtime.    torsemide (DEMADEX) 20 MG tablet Take 2 tablets (40 mg total) by mouth daily.   VITAMIN D PO Take by mouth.     Allergies:   Amlodipine, Codeine, Lisinopril, Morphine, and Amiodarone   Social History   Socioeconomic History   Marital status: Married    Spouse name: Not on file   Number of children: Not on file   Years of education: Not on file   Highest education level: Not on file  Occupational History   Not on file  Tobacco Use   Smoking status: Former    Years: 3.00    Types: Cigarettes    Quit date: 08/17/1948    Years since quitting: 74.0   Smokeless tobacco: Never  Vaping Use   Vaping Use: Never used  Substance and Sexual Activity   Alcohol use: No   Drug use: No   Sexual activity: Not on file  Other Topics Concern   Not on file  Social History Narrative   Not on file   Social Determinants of Health   Financial Resource Strain: Not on file  Food Insecurity: Not on file  Transportation Needs: Not on file  Physical Activity: Not on file  Stress: Not on  file  Social Connections: Not on file     Family History: The patient's family history includes Cancer in his sister; Heart attack in his father; Heart disease in his father and sister; Heart failure in his mother; Other in his sister; Stroke in his mother; Varicose Veins in his mother.  ROS:   Please see the history of present illness.    No blood in the urine or stool.  No head trauma.  All other systems reviewed and are negative.  EKGs/Labs/Other Studies Reviewed:    The following studies  were reviewed today: No new or recent imaging.  EKG:  EKG not performed  Recent Labs: 11/03/2021: Hemoglobin 12.1; Platelets 130 11/24/2021: BUN 33; Creatinine, Ser 1.09; NT-Pro BNP CANCELED; Potassium 3.8; Sodium 142  Recent Lipid Panel    Component Value Date/Time   CHOL 138 01/03/2018 1146   TRIG 86 01/03/2018 1146   HDL 53 01/03/2018 1146   CHOLHDL 2.6 01/03/2018 1146   LDLCALC 68 01/03/2018 1146    Physical Exam:    VS:  BP (!) 122/58   Pulse 62   Ht '5\' 8"'$  (1.727 m)   Wt 156 lb 6.4 oz (70.9 kg)   SpO2 98%   BMI 23.78 kg/m     Wt Readings from Last 3 Encounters:  08/03/22 156 lb 6.4 oz (70.9 kg)  05/13/22 158 lb 3.2 oz (71.8 kg)  01/14/22 158 lb 6.4 oz (71.8 kg)     GEN: Elderly and frail but compatible with age. No acute distress HEENT: Normal NECK: No JVD. LYMPHATICS: No lymphadenopathy CARDIAC: No murmur. RRR no gallop, or edema. VASCULAR:  Normal Pulses. No bruits. RESPIRATORY:  Clear to auscultation without rales, wheezing or rhonchi  ABDOMEN: Soft, non-tender, non-distended, No pulsatile mass, MUSCULOSKELETAL: No deformity  SKIN: Warm and dry NEUROLOGIC:  Alert and oriented x 3 PSYCHIATRIC:  Normal affect   ASSESSMENT:    1. Chronic systolic heart failure ([place])   2. Chronic atrial fibrillation (HCC)   3. Coronary artery disease involving coronary bypass graft of native heart without angina pectoris   4. PACEMAKER, PERMANENT-Medtronic   5. Long term current  use of anticoagulant therapy   6. S/P AAA (abdominal aortic aneurysm) repair   7. Medication management   8. Fall, initial encounter    PLAN:    In order of problems listed above:  Continue current therapy that includes Farxiga, Demadex 40 mg/day, and metoprolol has been discontinued.  Blood pressures run relatively low and have prevented other therapies from being added.  No blood work has been done since up titration and diuretic with the switch to Demadex. Continue Eliquis 5 mg twice daily. Stable without anginal pain.  Continue statin therapy. Followed in device clinic by Dr. Caryl Comes. Will check kidney function today and decide if any change in therapy is needed based upon kidney function.  Guideline directed therapy for left ventricular systolic dysfunction: Angiotensin receptor-neprilysin inhibitor (ARNI)-Entresto; beta-blocker therapy - carvedilol, metoprolol succinate, or bisoprolol; mineralocorticoid receptor antagonist (MRA) therapy -spironolactone or eplerenone.  SGLT-2 agents -  Dapagliflozin [place]) or Empagliflozin ([place]).These therapies have been shown to improve clinical outcomes including reduction of rehospitalization, survival, and acute heart failure.  Procedure Ambrose Pancoast in 6 months.  Dr. Marlou Porch should probably be his cardiologist going forward.  Basic metabolic panel today.  Medication Adjustments/Labs and Tests Ordered: Current medicines are reviewed at length with the patient today.  Concerns regarding medicines are outlined above.  Orders Placed This Encounter  Procedures   DG Chest 2 View   Basic metabolic panel   Pro b natriuretic peptide (BNP)   No orders of the defined types were placed in this encounter.

## 2022-08-03 ENCOUNTER — Ambulatory Visit: Payer: Medicare Other | Attending: Interventional Cardiology | Admitting: Interventional Cardiology

## 2022-08-03 ENCOUNTER — Encounter: Payer: Self-pay | Admitting: Interventional Cardiology

## 2022-08-03 ENCOUNTER — Ambulatory Visit
Admission: RE | Admit: 2022-08-03 | Discharge: 2022-08-03 | Disposition: A | Discharge status: Home / Self Care | Payer: Medicare Other | Source: Ambulatory Visit | Attending: Interventional Cardiology | Admitting: Interventional Cardiology

## 2022-08-03 VITALS — BP 122/58 | HR 62 | Ht 68.0 in | Wt 156.4 lb

## 2022-08-03 DIAGNOSIS — I482 Chronic atrial fibrillation, unspecified: Secondary | ICD-10-CM

## 2022-08-03 DIAGNOSIS — W19XXXA Unspecified fall, initial encounter: Secondary | ICD-10-CM

## 2022-08-03 DIAGNOSIS — I25810 Atherosclerosis of coronary artery bypass graft(s) without angina pectoris: Secondary | ICD-10-CM

## 2022-08-03 DIAGNOSIS — Z7901 Long term (current) use of anticoagulants: Secondary | ICD-10-CM

## 2022-08-03 DIAGNOSIS — I5022 Chronic systolic (congestive) heart failure: Secondary | ICD-10-CM

## 2022-08-03 DIAGNOSIS — Z79899 Other long term (current) drug therapy: Secondary | ICD-10-CM | POA: Diagnosis not present

## 2022-08-03 DIAGNOSIS — Z950 Presence of cardiac pacemaker: Secondary | ICD-10-CM | POA: Diagnosis not present

## 2022-08-03 DIAGNOSIS — Z9889 Other specified postprocedural states: Secondary | ICD-10-CM | POA: Insufficient documentation

## 2022-08-03 DIAGNOSIS — Z8679 Personal history of other diseases of the circulatory system: Secondary | ICD-10-CM | POA: Diagnosis not present

## 2022-08-03 DIAGNOSIS — I255 Ischemic cardiomyopathy: Secondary | ICD-10-CM

## 2022-08-03 DIAGNOSIS — R0602 Shortness of breath: Secondary | ICD-10-CM | POA: Diagnosis not present

## 2022-08-03 NOTE — Patient Instructions (Signed)
Medication Instructions:  Your physician recommends that you continue on your current medications as directed. Please refer to the Current Medication list given to you today.  *If you need a refill on your cardiac medications before your next appointment, please call your pharmacy*  Lab Work: TODAY: BNP, BMET If you have labs (blood work) drawn today and your tests are completely normal, you will receive your results only by: [place] (if you have MyChart) OR A paper copy in the mail If you have any lab test that is abnormal or we need to change your treatment, we will call you to review the results.  Testing/Procedures: Your physician has ordered a 2-view chest x-ray. This will be performed at [place] at [place]  [phone]. You do not need an appointment, you may walk-in during business hours to have this performed.  Follow-Up: At [facility], you and your health needs are our priority.  As part of our continuing mission to provide you with exceptional heart care, we have created designated Provider Care Teams.  These Care Teams include your primary Cardiologist (physician) and Advanced Practice Providers (APPs -  Physician Assistants and Nurse Practitioners) who all work together to provide you with the care you need, when you need it.  Your next appointment:   4-6 month(s)  The format for your next appointment:   In Person  Provider:   Ambrose Pancoast, NP   Important Information About Sugar

## 2022-08-04 LAB — BASIC METABOLIC PANEL
BUN/Creatinine Ratio: 23 (ref 10–24)
BUN: 27 mg/dL (ref 10–36)
CO2: 27 mmol/L (ref 20–29)
Calcium: 8.9 mg/dL (ref 8.6–10.2)
Chloride: 100 mmol/L (ref 96–106)
Creatinine, Ser: 1.2 mg/dL (ref 0.76–1.27)
Glucose: 103 mg/dL — ABNORMAL HIGH (ref 70–99)
Potassium: 3.4 mmol/L — ABNORMAL LOW (ref 3.5–5.2)
Sodium: 140 mmol/L (ref 134–144)
eGFR: 56 mL/min/{1.73_m2} — ABNORMAL LOW (ref >59)

## 2022-08-04 LAB — PRO B NATRIURETIC PEPTIDE: NT-Pro BNP: 3399 pg/mL — ABNORMAL HIGH (ref 0–486)

## 2022-08-06 ENCOUNTER — Telehealth: Payer: Self-pay | Admitting: Interventional Cardiology

## 2022-08-06 NOTE — Telephone Encounter (Signed)
Discussed lab results with patient's wife Sherrell (OK per DPR).  See result notes.

## 2022-08-06 NOTE — Telephone Encounter (Signed)
Patient's wife is requesting a call back to discuss lab results.

## 2022-08-09 ENCOUNTER — Encounter (HOSPITAL_COMMUNITY): Payer: Self-pay

## 2022-08-09 ENCOUNTER — Emergency Department (HOSPITAL_COMMUNITY): Payer: Medicare Other

## 2022-08-09 ENCOUNTER — Inpatient Hospital Stay (HOSPITAL_COMMUNITY)
Admission: EM | Admit: 2022-08-09 | Discharge: 2022-08-11 | DRG: 281 | Disposition: A | Discharge status: Home / Self Care | Payer: Medicare Other | Attending: Cardiology | Admitting: Cardiology

## 2022-08-09 DIAGNOSIS — I255 Ischemic cardiomyopathy: Secondary | ICD-10-CM | POA: Diagnosis present

## 2022-08-09 DIAGNOSIS — R001 Bradycardia, unspecified: Secondary | ICD-10-CM | POA: Diagnosis present

## 2022-08-09 DIAGNOSIS — E785 Hyperlipidemia, unspecified: Secondary | ICD-10-CM | POA: Diagnosis not present

## 2022-08-09 DIAGNOSIS — I214 Non-ST elevation (NSTEMI) myocardial infarction: Principal | ICD-10-CM | POA: Diagnosis present

## 2022-08-09 DIAGNOSIS — I482 Chronic atrial fibrillation, unspecified: Secondary | ICD-10-CM | POA: Diagnosis present

## 2022-08-09 DIAGNOSIS — Z87891 Personal history of nicotine dependence: Secondary | ICD-10-CM

## 2022-08-09 DIAGNOSIS — I714 Abdominal aortic aneurysm, without rupture, unspecified: Secondary | ICD-10-CM | POA: Diagnosis not present

## 2022-08-09 DIAGNOSIS — I2510 Atherosclerotic heart disease of native coronary artery without angina pectoris: Secondary | ICD-10-CM | POA: Diagnosis present

## 2022-08-09 DIAGNOSIS — I495 Sick sinus syndrome: Secondary | ICD-10-CM | POA: Diagnosis present

## 2022-08-09 DIAGNOSIS — Z8249 Family history of ischemic heart disease and other diseases of the circulatory system: Secondary | ICD-10-CM

## 2022-08-09 DIAGNOSIS — Z95810 Presence of automatic (implantable) cardiac defibrillator: Secondary | ICD-10-CM | POA: Diagnosis not present

## 2022-08-09 DIAGNOSIS — W19XXXA Unspecified fall, initial encounter: Secondary | ICD-10-CM | POA: Diagnosis present

## 2022-08-09 DIAGNOSIS — R079 Chest pain, unspecified: Secondary | ICD-10-CM | POA: Diagnosis not present

## 2022-08-09 DIAGNOSIS — I083 Combined rheumatic disorders of mitral, aortic and tricuspid valves: Secondary | ICD-10-CM | POA: Diagnosis present

## 2022-08-09 DIAGNOSIS — Z951 Presence of aortocoronary bypass graft: Secondary | ICD-10-CM

## 2022-08-09 DIAGNOSIS — M1990 Unspecified osteoarthritis, unspecified site: Secondary | ICD-10-CM | POA: Diagnosis present

## 2022-08-09 DIAGNOSIS — Z79899 Other long term (current) drug therapy: Secondary | ICD-10-CM

## 2022-08-09 DIAGNOSIS — Z66 Do not resuscitate: Secondary | ICD-10-CM | POA: Diagnosis present

## 2022-08-09 DIAGNOSIS — G4730 Sleep apnea, unspecified: Secondary | ICD-10-CM | POA: Diagnosis present

## 2022-08-09 DIAGNOSIS — Z885 Allergy status to narcotic agent status: Secondary | ICD-10-CM

## 2022-08-09 DIAGNOSIS — Z7901 Long term (current) use of anticoagulants: Secondary | ICD-10-CM | POA: Diagnosis not present

## 2022-08-09 DIAGNOSIS — I4821 Permanent atrial fibrillation: Secondary | ICD-10-CM | POA: Diagnosis present

## 2022-08-09 DIAGNOSIS — I110 Hypertensive heart disease with heart failure: Secondary | ICD-10-CM | POA: Diagnosis not present

## 2022-08-09 DIAGNOSIS — Z95828 Presence of other vascular implants and grafts: Secondary | ICD-10-CM | POA: Diagnosis not present

## 2022-08-09 DIAGNOSIS — K219 Gastro-esophageal reflux disease without esophagitis: Secondary | ICD-10-CM | POA: Diagnosis present

## 2022-08-09 DIAGNOSIS — Z888 Allergy status to other drugs, medicaments and biological substances status: Secondary | ICD-10-CM | POA: Diagnosis not present

## 2022-08-09 DIAGNOSIS — I5022 Chronic systolic (congestive) heart failure: Secondary | ICD-10-CM | POA: Diagnosis present

## 2022-08-09 DIAGNOSIS — Z96653 Presence of artificial knee joint, bilateral: Secondary | ICD-10-CM | POA: Diagnosis present

## 2022-08-09 LAB — TROPONIN I (HIGH SENSITIVITY)
Troponin I (High Sensitivity): 5428 ng/L (ref <18)
Troponin I (High Sensitivity): 6499 ng/L (ref <18)
Troponin I (High Sensitivity): 4757 ng/L (ref <18)
Troponin I (High Sensitivity): 3727 ng/L (ref <18)

## 2022-08-09 LAB — CBC
HCT: 37.5 % — ABNORMAL LOW (ref 39.0–52.0)
Hemoglobin: 12.1 g/dL — ABNORMAL LOW (ref 13.0–17.0)
MCH: 31.4 pg (ref 26.0–34.0)
MCHC: 32.3 g/dL (ref 30.0–36.0)
MCV: 97.4 fL (ref 80.0–100.0)
Platelets: 147 10*3/uL — ABNORMAL LOW (ref 150–400)
RBC: 3.85 MIL/uL — ABNORMAL LOW (ref 4.22–5.81)
RDW: 15.5 % (ref 11.5–15.5)
WBC: 12.8 10*3/uL — ABNORMAL HIGH (ref 4.0–10.5)
nRBC: 0 % (ref 0.0–0.2)

## 2022-08-09 LAB — BASIC METABOLIC PANEL
Anion gap: 9 (ref 5–15)
BUN: 32 mg/dL — ABNORMAL HIGH (ref 8–23)
CO2: 26 mmol/L (ref 22–32)
Calcium: 8.9 mg/dL (ref 8.9–10.3)
Chloride: 106 mmol/L (ref 98–111)
Creatinine, Ser: 1.29 mg/dL — ABNORMAL HIGH (ref 0.61–1.24)
GFR, Estimated: 51 mL/min — ABNORMAL LOW (ref >60)
Glucose, Bld: 104 mg/dL — ABNORMAL HIGH (ref 70–99)
Potassium: 3.5 mmol/L (ref 3.5–5.1)
Sodium: 141 mmol/L (ref 135–145)

## 2022-08-09 LAB — PROTIME-INR
INR: 1.5 — ABNORMAL HIGH (ref 0.8–1.2)
Prothrombin Time: 18 seconds — ABNORMAL HIGH (ref 11.4–15.2)

## 2022-08-09 LAB — APTT
aPTT: 38 seconds — ABNORMAL HIGH (ref 24–36)
aPTT: 68 seconds — ABNORMAL HIGH (ref 24–36)

## 2022-08-09 LAB — HEPARIN LEVEL (UNFRACTIONATED): Heparin Unfractionated: >1.1 IU/mL — ABNORMAL HIGH (ref 0.30–0.70)

## 2022-08-09 LAB — BRAIN NATRIURETIC PEPTIDE: B Natriuretic Peptide: 1292.9 pg/mL — ABNORMAL HIGH (ref 0.0–100.0)

## 2022-08-09 MED ORDER — ASPIRIN 81 MG PO TBEC: 81.0000 mg | DELAYED_RELEASE_TABLET | Freq: Every day | ORAL | Status: DC

## 2022-08-09 MED ORDER — ASPIRIN 81 MG PO TBEC
81.0000 mg | DELAYED_RELEASE_TABLET | Freq: Every day | ORAL | Status: DC
Start: 2022-08-10 — End: 2022-08-11
  Administered 2022-08-10 – 2022-08-11 (×2): 81 mg via ORAL
  Filled 2022-08-09 (×2): qty 1

## 2022-08-09 MED ORDER — TAMSULOSIN HCL 0.4 MG PO CAPS
0.4000 mg | ORAL_CAPSULE | Freq: Every day | ORAL | Status: DC
  Administered 2022-08-09 – 2022-08-10 (×2): 0.4 mg via ORAL
  Filled 2022-08-09 (×2): qty 1

## 2022-08-09 MED ORDER — DAPAGLIFLOZIN PROPANEDIOL 10 MG PO TABS
10.0000 mg | ORAL_TABLET | Freq: Every day | ORAL | Status: DC
  Administered 2022-08-10 – 2022-08-11 (×2): 10 mg via ORAL
  Filled 2022-08-09 (×2): qty 1

## 2022-08-09 MED ORDER — ACETAMINOPHEN 325 MG PO TABS: 650.0000 mg | ORAL_TABLET | ORAL | Status: DC | PRN

## 2022-08-09 MED ORDER — NITROGLYCERIN 0.4 MG SL SUBL: 0.4000 mg | SUBLINGUAL_TABLET | SUBLINGUAL | Status: DC | PRN

## 2022-08-09 MED ORDER — ONDANSETRON HCL 4 MG/2ML IJ SOLN: 4.0000 mg | Freq: Four times a day (QID) | INTRAMUSCULAR | Status: DC | PRN

## 2022-08-09 MED ORDER — FUROSEMIDE 10 MG/ML IJ SOLN
60.0000 mg | Freq: Once | INTRAMUSCULAR | Status: AC
  Administered 2022-08-09: 60 mg via INTRAVENOUS
  Filled 2022-08-09: qty 6

## 2022-08-09 MED ORDER — FINASTERIDE 5 MG PO TABS
5.0000 mg | ORAL_TABLET | Freq: Every day | ORAL | Status: DC
  Administered 2022-08-10 – 2022-08-11 (×2): 5 mg via ORAL
  Filled 2022-08-09 (×2): qty 1

## 2022-08-09 MED ORDER — ATORVASTATIN CALCIUM 80 MG PO TABS
80.0000 mg | ORAL_TABLET | Freq: Every day | ORAL | Status: DC
  Administered 2022-08-10 – 2022-08-11 (×2): 80 mg via ORAL
  Filled 2022-08-09 (×2): qty 1

## 2022-08-09 MED ORDER — HEPARIN (PORCINE) 25000 UT/250ML-% IV SOLN
850.0000 [IU]/h | INTRAVENOUS | Status: DC
  Administered 2022-08-09 – 2022-08-10 (×2): 850 [IU]/h via INTRAVENOUS
  Filled 2022-08-09 (×2): qty 250

## 2022-08-09 MED ORDER — ASPIRIN 81 MG PO CHEW
324.0000 mg | CHEWABLE_TABLET | Freq: Once | ORAL | Status: AC
  Administered 2022-08-09: 324 mg via ORAL
  Filled 2022-08-09: qty 4

## 2022-08-09 MED ORDER — PREGABALIN 75 MG PO CAPS
75.0000 mg | ORAL_CAPSULE | Freq: Two times a day (BID) | ORAL | Status: DC
  Administered 2022-08-09 – 2022-08-11 (×4): 75 mg via ORAL
  Filled 2022-08-09 (×4): qty 1

## 2022-08-09 NOTE — ED Triage Notes (Addendum)
AAOx3, pt stated chest pain is 3/10, on set 3 weeks after fall, pt stated he tripped over rug, currently experience some shortness of breath. Bp 127/66 map 82, o2 99% 2L nasal canula.

## 2022-08-09 NOTE — Progress Notes (Signed)
ANTICOAGULATION CONSULT NOTE  Pharmacy Consult for Heparin Indication: chest pain/ACS  Allergies  Allergen Reactions   Amlodipine Other (See Comments)    Wife unsure but thinks nausea was reaction   Codeine Nausea Only   Lisinopril Other (See Comments)    Elevated potassium   Morphine Nausea Only and Other (See Comments)   Amiodarone Nausea And Vomiting, Anxiety and Rash    Patient Measurements: Height: '5\' 8"'$  (172.7 cm) Weight: 70.3 kg (155 lb) IBW/kg (Calculated) : 68.4 Heparin Dosing Weight: actual weight  Vital Signs: Temp: 97.7 F (36.5 C) (12/24 2028) Temp Source: Oral (12/24 2028) BP: 135/63 (12/24 2028) Pulse Rate: 54 (12/24 2028)  Labs: Recent Labs    08/09/22 1219 08/09/22 1342 08/09/22 1937 08/09/22 2113  HGB 12.1*  --   --   --   HCT 37.5*  --   --   --   PLT 147*  --   --   --   APTT  --  38*  --  68*  LABPROT  --  18.0*  --   --   INR  --  1.5*  --   --   HEPARINUNFRC  --  >1.10*  --   --   CREATININE 1.29*  --   --   --   TROPONINIHS 5,428* 6,499* 4,757*  --      Estimated Creatinine Clearance: 33.1 mL/min (A) (by C-G formula based on SCr of 1.29 mg/dL (H)).   Medical History: Past Medical History:  Diagnosis Date   AAA (abdominal aortic aneurysm) (HCC)    Anginal pain (HCC)    Arthritis    CAD (coronary artery disease)    Adam [name] IS CARDIO   CHF (congestive heart failure) (HCC)    Chronic anticoagulation    Diverticulosis    GERD (gastroesophageal reflux disease)    Hyperlipidemia    Hypertension    LBP (low back pain)    Permanent atrial fibrillation ([place])    decision made at [facility] 2015   Presence of permanent cardiac pacemaker    S/P CABG (coronary artery bypass graft)    Shortness of breath dyspnea    Sleep apnea     Medications:  Scheduled:   [START ON 08/10/2022] aspirin EC  81 mg Oral Daily   [START ON 08/10/2022] atorvastatin  80 mg Oral Daily   [START ON 08/10/2022] dapagliflozin propanediol  10 mg Oral QAC  breakfast   [START ON 08/10/2022] finasteride  5 mg Oral Daily   pregabalin  75 mg Oral Q12H   tamsulosin  0.4 mg Oral QPC supper   Infusions:   heparin 850 Units/hr (08/09/22 1540)   PRN:   Assessment: 88 yo male presents with chest pain, troponin elevated 5428. Initial EKG with concern for ST depression in the lateral leads. Pharmacy consulted to dose IV heparin.  Last dose of eliquis '5mg'$  at 09:00 this morning. Baseline heparin level elevated >1.1 as expected, aPTT low at 38. aPTT tonight came back therapeutic on heparin 850 units/hr. No s/sx of bleeding or infusion issues.   Goal of Therapy:  aPTT 66-102 seconds Heparin level 0.3-0.7 units/ml Monitor platelets by anticoagulation protocol: Yes   Plan:  Continue heparin infusion at 850 units/hr >> will order heparin level in 8 hours with AM labs  Continue to monitor H&H and platelets Will monitor using aPTT until effects of DOAC have diminished, then can monitor using heparin levels once the two correlate  Antonietta Jewel, PharmD, [place] Clinical Pharmacist  Phone: [phone] 08/09/2022 10:30 PM  Please check AMION for all [facility] phone numbers After 10:00 PM, call [place] [phone]

## 2022-08-09 NOTE — Progress Notes (Signed)
Patient oxygen desated while ambulating to the restroom. Patient oxygen saturation is 97 at rest and 80s with exertion on rom air

## 2022-08-09 NOTE — ED Triage Notes (Signed)
Pt c/o chest pain by his pacemaker x 3 days. Pt states he hasn't slept due to pain . Pt also c/o SHOB, worse at night.

## 2022-08-09 NOTE — ED Provider Notes (Signed)
Patient arrives from [facility] emergency room as an ED to ED transfer for cardiology evaluation.  Upon arrival patient is chest pain-free.  Heparin drip is infusing.  Case discussed with Dr. Harl Bowie to notify him of patient's arrival.  He will evaluate patient for admission.  Family aware of plan. See note from Dr. Tyrone Nine for compete HPI and plan.    Evlyn Courier, PA-C 08/09/22 [place] A, DO 08/09/22 1702

## 2022-08-09 NOTE — ED Provider Triage Note (Signed)
Emergency Medicine Provider Triage Evaluation Note  Adam [name] , a 88 y.o. male  was evaluated in triage.  Pt complains of chest pain around the right rib cage.  He had a mechanical fall 2 weeks ago.  He is on blood thinner for atrial fibrillation and is status post AICD and pacemaker.  It is on the right side of his chest.  He was seen by his cardiologist earlier this week and had a workup and a chest x-ray that were negative but has persistent pain and shortness of breath which is worse with movement.  He denies loss of consciousness and did not hit his head.  He has no other complaints at this time.  Review of Systems  Positive: Chest pain Negative: Hemoptysis  Physical Exam  BP 131/62 (BP Location: Right Arm)   Pulse 66   Temp 97.6 F (36.4 C) (Oral)   Resp 18   Ht '5\' 8"'$  (1.727 m)   Wt 70.8 kg   SpO2 99%   BMI 23.72 kg/m  Gen:   Awake, no distress   Resp:  Normal effort  MSK:   Moves extremities without difficulty  Other:  Bruising around the right chest wall, no subcutaneous crepitus or step-off  Medical Decision Making  Medically screening exam initiated at 12:16 PM.  Appropriate orders placed.  Adam [name] was informed that the remainder of the evaluation will be completed by another provider, this initial triage assessment does not replace that evaluation, and the importance of remaining in the ED until their evaluation is complete.     Margarita Mail, PA-C 08/09/22 1219

## 2022-08-09 NOTE — ED Provider Notes (Signed)
[facility] DEPT Provider Note   CSN: [id number] Arrival date & time: 08/09/22  1155     History  Chief Complaint  Patient presents with   Chest Pain    Adam [name] is a 88 y.o. male.  88 yo M with a chief complaint of chest pain.  This been going on for a few weeks now.  He had a fall at the onset of his symptoms and thought maybe it was musculoskeletal.  She would seem to be worse with exertion and better with rest.  No significant shortness of breath with exertion as well.  Pain is sternal.  Denies radiation.  Pain was much worse this morning and so decided to come to the ED for evaluation.  He has been having trouble sleeping at night at times for the past 3 nights or so.  Denies significant leg swelling.  Denies cough congestion or fever.   Chest Pain      Home Medications Prior to Admission medications   Medication Sig Start Date End Date Taking? Authorizing Provider  acetaminophen (TYLENOL) 650 MG CR tablet Take 1,300 mg by mouth as needed for pain.   Yes [provider]  allopurinol (ZYLOPRIM) 300 MG tablet Take 300 mg by mouth daily as needed (gout). 07/28/21  Yes [provider]  apixaban (ELIQUIS) 5 MG TABS tablet Take 1 tablet (5 mg total) by mouth 2 (two) times daily. 05/29/22  Yes [name], MD  atorvastatin (LIPITOR) 10 MG tablet Take 1 tablet (10 mg total) by mouth every evening. Patient taking differently: Take 10 mg by mouth daily. 10/18/17  Yes [name], MD  dapagliflozin propanediol (FARXIGA) 10 MG TABS tablet Take 1 tablet (10 mg total) by mouth daily before breakfast. 12/09/21  Yes [name], MD  dorzolamide (TRUSOPT) 2 % ophthalmic solution Place 1 drop into the right eye 2 (two) times daily. 03/13/20  Yes [provider]  finasteride (PROSCAR) 5 MG tablet Take 5 mg by mouth daily. 06/26/19  Yes [provider]  latanoprost (XALATAN) 0.005 % ophthalmic solution Place 1 drop  into both eyes at bedtime.   Yes [provider]  Multiple Vitamins-Minerals (PRESERVISION AREDS PO) Take 1 tablet by mouth in the morning and at bedtime.   Yes [provider]  nitroGLYCERIN (NITROSTAT) 0.4 MG SL tablet PLACE 1 TABLET UNDER THE TONGUE EVERY 5 MINUTES AS NEEDED FOR CHEST PAIN Patient taking differently: Place 0.4 mg under the tongue every 5 (five) minutes as needed for chest pain. 06/27/20  Yes [name], MD  pregabalin (LYRICA) 75 MG capsule Take 75 mg by mouth every 12 (twelve) hours. 12/18/20  Yes [provider]  silodosin (RAPAFLO) 8 MG CAPS capsule Take 8 mg by mouth at bedtime.  04/17/14  Yes [provider]  torsemide (DEMADEX) 20 MG tablet Take 2 tablets (40 mg total) by mouth daily. Patient taking differently: Take 20 mg by mouth 2 (two) times daily. 01/14/22  Yes [name], MD  VITAMIN D PO Take 1 capsule by mouth daily.   Yes [provider]      Allergies    Amlodipine, Codeine, Lisinopril, Morphine, and Amiodarone    Review of Systems   Review of Systems  Cardiovascular:  Positive for chest pain.    Physical Exam Updated Vital Signs BP (!) 119/59   Pulse (!) 43   Temp 97.6 F (36.4 C) (Oral)   Resp 16  Ht '5\' 8"'$  (1.727 m)   Wt 70.3 kg   SpO2 99%   BMI 23.57 kg/m  Physical Exam Vitals and nursing note reviewed.  Constitutional:      Appearance: He is well-developed.  HENT:     Head: Normocephalic and atraumatic.  Eyes:     Pupils: Pupils are equal, round, and reactive to light.  Neck:     Vascular: No JVD.  Cardiovascular:     Rate and Rhythm: Normal rate and regular rhythm.     Heart sounds: No murmur heard.    No friction rub. No gallop.  Pulmonary:     Effort: No respiratory distress.     Breath sounds: No wheezing.  Abdominal:     General: There is no distension.     Tenderness: There is no abdominal tenderness. There is no guarding or rebound.  Musculoskeletal:        General:  Normal range of motion.     Cervical back: Normal range of motion and neck supple.  Skin:    Coloration: Skin is not pale.     Findings: No rash.  Neurological:     Mental Status: He is alert and oriented to person, place, and time.  Psychiatric:        Behavior: Behavior normal.     ED Results / Procedures / Treatments   Labs (all labs ordered are listed, but only abnormal results are displayed) Labs Reviewed  BASIC METABOLIC PANEL - Abnormal; Notable for the following components:      Result Value   Glucose, Bld 104 (*)    BUN 32 (*)    Creatinine, Ser 1.29 (*)    GFR, Estimated 51 (*)    All other components within normal limits  CBC - Abnormal; Notable for the following components:   WBC 12.8 (*)    RBC 3.85 (*)    Hemoglobin 12.1 (*)    HCT 37.5 (*)    Platelets 147 (*)    All other components within normal limits  BRAIN NATRIURETIC PEPTIDE - Abnormal; Notable for the following components:   B Natriuretic Peptide 1,292.9 (*)    All other components within normal limits  APTT - Abnormal; Notable for the following components:   aPTT 38 (*)    All other components within normal limits  PROTIME-INR - Abnormal; Notable for the following components:   Prothrombin Time 18.0 (*)    INR 1.5 (*)    All other components within normal limits  HEPARIN LEVEL (UNFRACTIONATED) - Abnormal; Notable for the following components:   Heparin Unfractionated >1.10 (*)    All other components within normal limits  TROPONIN I (HIGH SENSITIVITY) - Abnormal; Notable for the following components:   Troponin I (High Sensitivity) 5,428 (*)    All other components within normal limits  APTT  TROPONIN I (HIGH SENSITIVITY)    EKG EKG Interpretation  Date/Time:  Sunday August 09 2022 13:15:37 EST Ventricular Rate:  44 PR Interval:    QRS Duration: 126 QT Interval:  517 QTC Calculation: 443 R Axis:   -67 Text Interpretation: Junctional rhythm RBBB and LAFB downsloping st segments in  the inferior and lateral leads not seen on prior Otherwise no significant change Confirmed by Deno Etienne [phone]) on 08/09/2022 1:24:06 PM  Radiology DG Chest 2 View  Result Date: 08/09/2022 CLINICAL DATA:  cp chest pain; pain by pacemaker EXAM: CHEST - 2 VIEW COMPARISON:  August 03, 2022; January 25, 2017 FINDINGS: The cardiomediastinal silhouette  is unchanged in contour.Status post median sternotomy. Dual lead RIGHT chest cardiac pacing device. No pleural effusion. No pneumothorax. No acute pleuroparenchymal abnormality. Unchanged chronic interstitial markings. Partial visualization of endovascular repair of the abdominal aorta. Multilevel degenerative changes of the thoracic spine. IMPRESSION: No acute cardiopulmonary abnormality. Electronically Signed   By: Valentino Saxon M.D.   On: 08/09/2022 12:29    Procedures .Critical Care  Performed by: Deno Etienne, DO Authorized by: Deno Etienne, DO   Critical care provider statement:    Critical care time (minutes):  35   Critical care time was exclusive of:  Separately billable procedures and treating other patients   Critical care was time spent personally by me on the following activities:  Development of treatment plan with patient or surrogate, discussions with consultants, evaluation of patient's response to treatment, examination of patient, ordering and review of laboratory studies, ordering and review of radiographic studies, ordering and performing treatments and interventions, pulse oximetry, re-evaluation of patient's condition and review of old charts   Care discussed with: admitting provider       Medications Ordered in ED Medications  heparin ADULT infusion 100 units/mL (25000 units/245m) (850 Units/hr Intravenous New Bag/Given 08/09/22 1435)  aspirin chewable tablet 324 mg (324 mg Oral Given 08/09/22 1356)    ED Course/ Medical Decision Making/ A&P                           Medical Decision Making Amount and/or Complexity of  Data Reviewed Labs: ordered. Radiology: ordered. ECG/medicine tests: ordered.  Risk OTC drugs. Prescription drug management.   88yo M with a chief complaint of chest pain.  This is typical in nature, going on for a couple weeks but worsening this morning.  Initial EKG with concern for ST depression in the lateral leads.  Repeat also involving possibly the anterior leads.  Patient is pain-free on my exam.  Mild leukocytosis.  Renal function at baseline.  Troponin is significantly elevated at 5000.  Will start on heparin.  Given dose of aspirin.  Will discuss with cardiology.  I discussed case with Dr. BHarl Bowie cardiology recommends ED to ED transfer.  I discussed this with Dr. GPearline Cablesexcepts in ED to ED transfer.  The patients results and plan were reviewed and discussed.   Any x-rays performed were independently reviewed by myself.   Differential diagnosis were considered with the presenting HPI.  Medications  heparin ADULT infusion 100 units/mL (25000 units/253m (850 Units/hr Intravenous New Bag/Given 08/09/22 1435)  aspirin chewable tablet 324 mg (324 mg Oral Given 08/09/22 1356)    Vitals:   08/09/22 1315 08/09/22 1330 08/09/22 1400 08/09/22 1430  BP: 112/61 111/65 115/62 (!) 119/59  Pulse: (!) 54 (!) 48 (!) 43 (!) 43  Resp: '20 19 16   '$ Temp:      TempSrc:      SpO2: 100% 100% 100% 99%  Weight:      Height:        Final diagnoses:  NSTEMI (non-ST elevated myocardial infarction) ([place]   Admission/ observation were discussed with the admitting physician, patient and/or family and they are comfortable with the plan.          Final Clinical Impression(s) / ED Diagnoses Final diagnoses:  NSTEMI (non-ST elevated myocardial infarction) ([facility]   Rx / DC Orders ED Discharge Orders     None         FlDeno EtienneDO 08/09/22 1450

## 2022-08-09 NOTE — H&P (Signed)
Cardiology Admission History and Physical   Patient ID: Adam [name] [name]: [id number]; DOB: 1925-01-13   Admission date: 08/09/2022  PCP:  [name], [place] [facility] Cardiologist:  Sinclair Grooms, MD   {    Chief Complaint:  Chest pain  Patient Profile:   Adam [name] is a 88 y.o. male with CAD with CAGB  in late 1980s, ICM, tachy brady with PPM, AAA, chronic HFrEF, chronic afib who is being seen 08/09/2022 for the evaluation of chest pain.  History of Present Illness:   Adam [name] 88 yo male history of CAD with CAGB  in late 1980s, ICM, tachy brady with PPM, AAA, chronic HFrEF, chronic afib presents with chest pain. Initermittent symptoms over the last 3 weeks. Central chest pressure with associated SOB.     K 3.5 Cr 1.29 BUN 32 WBC 12.8 Hgb 12.1 Plt 147 BNP 1292 Trop 5428--> CXR no acute process Junctional rhythm, anterior/anterolateral ST depression   03/2022 echo Study Conclusions   - Left ventricle: The cavity size was normal. Wall thickness was    normal. Indeterminant diastolic function (atrial fibrillation).    The estimated ejection fraction was 35%. Diffuse hypokinesis.  - Aortic valve: There was no stenosis. There was trivial    regurgitation.  - Mitral valve: Mildly calcified annulus. There was mild    regurgitation.  - Left atrium: The atrium was moderately dilated.  - Right ventricle: The cavity size was normal. Systolic function    was mildly reduced.  - Right atrium: The atrium was moderately dilated.  - Tricuspid valve: There was severe regurgitation. There was    systolic flow reversal in the hepatic vein doppler signal. Peak    RV-RA gradient (S): 50 mm Hg.  - Pulmonary arteries: PA peak pressure: 65 mm Hg (S).  - Systemic veins: IVC measured 2.5 cm with < 50% respirophasic    variation, suggesting RA pressure 15 mmHg.    03/2014 cath: LM diffuse narrowing, LAD occluded, LCX 90% ostial, occluded RCA with  right to right collaterals. SVG-OM occluded chronically since 1990s, SVG-diag patent, SVG-distal RCA patent, LIMA-LAD patent    Past Medical History:  Diagnosis Date   AAA (abdominal aortic aneurysm) (HCC)    Anginal pain (HCC)    Arthritis    CAD (coronary artery disease)    Adam [name] IS CARDIO   CHF (congestive heart failure) (HCC)    Chronic anticoagulation    Diverticulosis    GERD (gastroesophageal reflux disease)    Hyperlipidemia    Hypertension    LBP (low back pain)    Permanent atrial fibrillation ([place])    decision made at [facility] 2015   Presence of permanent cardiac pacemaker    S/P CABG (coronary artery bypass graft)    Shortness of breath dyspnea    Sleep apnea     Past Surgical History:  Procedure Laterality Date   CARDIOVERSION  06/30/2012   Procedure: CARDIOVERSION;  Surgeon: Sinclair Grooms, MD;  Location: [place];  Service: Cardiovascular;  Laterality: N/A;   CARDIOVERSION N/A 11/23/2013   Procedure: CARDIOVERSION;  Surgeon: Sinclair Grooms, MD;  Location: [place];  Service: Cardiovascular;  Laterality: N/A;   CATARACT EXTRACTION W/ INTRAOCULAR LENS  IMPLANT, BILATERAL     CORONARY ARTERY BYPASS GRAFT  Feb 1989   "CABG X6"   ENDOVASCULAR STENT INSERTION  07/28/2011   Procedure: ENDOVASCULAR STENT GRAFT INSERTION;  Surgeon: Hinda Lenis,  MD;  Location: [place];  Service: Vascular;  Laterality: N/A;  Pt. on Coumadin/ instructed to hold 5 days prior to procedure   [place] Right 01/04/2015   INGUINAL HERNIA REPAIR Right 01/04/2015   Procedure:  [place];  Surgeon: Fanny Skates, MD;  Location: [place];  Service: General;  Laterality: Right;   INSERT / REPLACE / REMOVE PACEMAKER  July 2008   INSERTION OF MESH Right 01/04/2015   Procedure: INSERTION OF MESH;  Surgeon: Fanny Skates, MD;  Location: [place];  Service: General;  Laterality: Right;   [place]   Bilateral total  knee replacements   LEFT HEART CATHETERIZATION WITH CORONARY ANGIOGRAM N/A 03/28/2014   Procedure: LEFT HEART CATHETERIZATION WITH CORONARY ANGIOGRAM;  Surgeon: Sinclair Grooms, MD;  Location: [facility] CATH LAB;  Service: Cardiovascular;  Laterality: N/A;   PERMANENT PACEMAKER GENERATOR CHANGE N/A 11/03/2013   Procedure: PERMANENT PACEMAKER GENERATOR CHANGE;  Surgeon: [name], MD;  Location: [facility] C[facility] LAB;  Service: Cardiovascular;  Laterality: N/A;   TONSILLECTOMY       Medications Prior to Admission: Prior to Admission medications   Medication Sig Start Date End Date Taking? Authorizing Provider  acetaminophen (TYLENOL) 650 MG CR tablet Take 1,300 mg by mouth as needed for pain.   Yes [provider]  allopurinol (ZYLOPRIM) 300 MG tablet Take 300 mg by mouth daily as needed (gout). 07/28/21  Yes [provider]  apixaban (ELIQUIS) 5 MG TABS tablet Take 1 tablet (5 mg total) by mouth 2 (two) times daily. 05/29/22  Yes [name], MD  atorvastatin (LIPITOR) 10 MG tablet Take 1 tablet (10 mg total) by mouth every evening. Patient taking differently: Take 10 mg by mouth daily. 10/18/17  Yes [name], MD  dapagliflozin propanediol (FARXIGA) 10 MG TABS tablet Take 1 tablet (10 mg total) by mouth daily before breakfast. 12/09/21  Yes [name], MD  dorzolamide (TRUSOPT) 2 % ophthalmic solution Place 1 drop into the right eye 2 (two) times daily. 03/13/20  Yes [provider]  finasteride (PROSCAR) 5 MG tablet Take 5 mg by mouth daily. 06/26/19  Yes [provider]  latanoprost (XALATAN) 0.005 % ophthalmic solution Place 1 drop into both eyes at bedtime.   Yes [provider]  Multiple Vitamins-Minerals (PRESERVISION AREDS PO) Take 1 tablet by mouth in the morning and at bedtime.   Yes [provider]  nitroGLYCERIN (NITROSTAT) 0.4 MG SL tablet PLACE 1 TABLET UNDER THE TONGUE EVERY 5 MINUTES AS NEEDED FOR CHEST PAIN Patient taking  differently: Place 0.4 mg under the tongue every 5 (five) minutes as needed for chest pain. 06/27/20  Yes [name], MD  pregabalin (LYRICA) 75 MG capsule Take 75 mg by mouth every 12 (twelve) hours. 12/18/20  Yes [provider]  silodosin (RAPAFLO) 8 MG CAPS capsule Take 8 mg by mouth at bedtime.  04/17/14  Yes [provider]  torsemide (DEMADEX) 20 MG tablet Take 2 tablets (40 mg total) by mouth daily. Patient taking differently: Take 20 mg by mouth 2 (two) times daily. 01/14/22  Yes [name], MD  VITAMIN D PO Take 1 capsule by mouth daily.   Yes [provider]     Allergies:    Allergies  Allergen Reactions   Amlodipine Other (See Comments)    Wife unsure but thinks nausea was reaction  Codeine Nausea Only   Lisinopril Other (See Comments)    Elevated potassium   Morphine Nausea Only and Other (See Comments)   Amiodarone Nausea And Vomiting, Anxiety and Rash    Social History:   Social History   Socioeconomic History   Marital status: Married    Spouse name: Not on file   Number of children: Not on file   Years of education: Not on file   Highest education level: Not on file  Occupational History   Not on file  Tobacco Use   Smoking status: Former    Years: 3.00    Types: Cigarettes    Quit date: 08/17/1948    Years since quitting: 74.0   Smokeless tobacco: Never  Vaping Use   Vaping Use: Never used  Substance and Sexual Activity   Alcohol use: No   Drug use: No   Sexual activity: Not on file  Other Topics Concern   Not on file  Social History Narrative   Not on file   Social Determinants of Health   Financial Resource Strain: Not on file  Food Insecurity: Not on file  Transportation Needs: Not on file  Physical Activity: Not on file  Stress: Not on file  Social Connections: Not on file  Intimate Partner Violence: Not on file    Family History:   The patient's family history includes Cancer in his sister; Heart attack  in his father; Heart disease in his father and sister; Heart failure in his mother; Other in his sister; Stroke in his mother; Varicose Veins in his mother.    ROS:  Please see the history of present illness.  All other ROS reviewed and negative.     Physical Exam/Data:   Vitals:   08/09/22 1240 08/09/22 1241 08/09/22 1300 08/09/22 1330  BP:   (!) 118/57 111/65  Pulse:   (!) 44 (!) 48  Resp:   19 19  Temp:      TempSrc:      SpO2:  98% 99% 100%  Weight: 70.3 kg     Height: '5\' 8"'$  (1.727 m)      No intake or output data in the 24 hours ending 08/09/22 1413    08/09/2022   12:40 PM 08/09/2022   11:59 AM 08/03/2022   10:41 AM  Last 3 Weights  Weight (lbs) 155 lb 156 lb 156 lb 6.4 oz  Weight (kg) 70.308 kg 70.761 kg 70.943 kg     Body mass index is 23.57 kg/m.  General:  Well nourished, well developed, in no acute distress HEENT: normal Neck: +JVD Vascular: No carotid bruits; Distal pulses 2+ bilaterally   Cardiac:  normal S1, S2; RRR; no murmur  Lungs:  crackles bilaterally  Abd: soft, nontender, no hepatomegaly  Ext: no edema Musculoskeletal:  No deformities, BUE and BLE strength normal and equal Skin: warm and dry  Neuro:  CNs 2-12 intact, no focal abnormalities noted Psych:  Normal affect      Laboratory Data:  High Sensitivity Troponin:   Recent Labs  Lab 08/09/22 [place]  Lab 08/03/22 1124 08/09/22 1219  NA 140 141  K 3.4* 3.5  CL 100 106  CO2 27 26  GLUCOSE 103* 104*  BUN 27 32*  CREATININE 1.20 1.29*  CALCIUM 8.9 8.9  GFRNONAA  --  51*  ANIONGAP  --  9    No results for input(s): "PROT", "ALBUMIN", "AST", "ALT", "ALKPHOS", "  BILITOT" in the last 168 hours. Lipids No results for input(s): "CHOL", "TRIG", "HDL", "LABVLDL", "LDLCALC", "CHOLHDL" in the last 168 hours. Hematology Recent Labs  Lab 08/09/22 1219  WBC 12.8*  RBC 3.85*  HGB 12.1*  HCT 37.5*  MCV 97.4  MCH 31.4  MCHC 32.3  RDW 15.5   PLT 147*   Thyroid No results for input(s): "TSH", "FREET4" in the last 168 hours. BNP Recent Labs  Lab 08/03/22 1124 08/09/22 1219  BNP  --  1,292.9*  PROBNP 3,399*  --     DDimer No results for input(s): "DDIMER" in the last 168 hours.   Radiology/Studies:  DG Chest 2 View  Result Date: 08/09/2022 CLINICAL DATA:  cp chest pain; pain by pacemaker EXAM: CHEST - 2 VIEW COMPARISON:  August 03, 2022; January 25, 2017 FINDINGS: The cardiomediastinal silhouette is unchanged in contour.Status post median sternotomy. Dual lead RIGHT chest cardiac pacing device. No pleural effusion. No pneumothorax. No acute pleuroparenchymal abnormality. Unchanged chronic interstitial markings. Partial visualization of endovascular repair of the abdominal aorta. Multilevel degenerative changes of the thoracic spine. IMPRESSION: No acute cardiopulmonary abnormality. Electronically Signed   By: Valentino Saxon M.D.   On: 08/09/2022 12:29     Assessment and Plan:   1.CAD - history of CABG in 1980s - 03/2014 cath: LM diffuse narrowing, LAD occluded, LCX 90% ostial, occluded RCA with right to right collaterals. SVG-OM occluded chronically since 1990s, SVG-diag patent, SVG-distal RCA patent, LIMA-LAD patent - trop 5400-->6499--> - Junctional rhythm, anterior/anterolateral ST depression  -88 yo patient but still functionsl indepedently with his ADLs, cognitively intact. We discussed cath options, at this time he is not interested in pursuing cath. Discussed in detail with patient, wife, and son and all agree that at this time patient does not want cath, will consider overnight and update [place] again in AM. He confirms he is DNR. Initially listed for cath, we will d/c - for now medical therapy with hep x 48 hrs, while on heparin and off eliquis start asa 81  2. Chronic HFrEF - from clinic notes low bp's have limited medical therapy. Just on farxiga '10mg'$  daily at home.  - evidence of fluid overload by exam, BNP  1292.  - dose IV lasix '60mg'$  x 1.   3.Afib - eliquis on hold for cath, on heparin gtt - self rate controlled  4.Tachy brady - has PPM  5. DNR - I confirmed with patient with wife and son present that his wishes are not to be resuscitated in the setting of cardiac arrest. We have listed him as DNR        For questions or updates, please contact [place] Please consult [email or web address] for contact info under     Signed, Carlyle Dolly, MD  08/09/2022 2:13 PM

## 2022-08-09 NOTE — Progress Notes (Signed)
ANTICOAGULATION CONSULT NOTE - Initial Consult  Pharmacy Consult for Heparin Indication: chest pain/ACS  Allergies  Allergen Reactions   Amlodipine Other (See Comments)    Wife unsure but thinks nausea was reaction   Codeine Nausea Only   Lisinopril Other (See Comments)    Elevated potassium   Morphine Nausea Only and Other (See Comments)   Amiodarone Nausea And Vomiting, Anxiety and Rash    Patient Measurements: Height: '5\' 8"'$  (172.7 cm) Weight: 70.3 kg (155 lb) IBW/kg (Calculated) : 68.4 Heparin Dosing Weight: actual weight  Vital Signs: Temp: 97.6 F (36.4 C) (12/24 1204) Temp Source: Oral (12/24 1204) BP: 118/57 (12/24 1300) Pulse Rate: 44 (12/24 1300)  Labs: Recent Labs    08/09/22 1219  HGB 12.1*  HCT 37.5*  PLT 147*  CREATININE 1.29*  TROPONINIHS 5,428*    Estimated Creatinine Clearance: 33.1 mL/min (A) (by C-G formula based on SCr of 1.29 mg/dL (H)).   Medical History: Past Medical History:  Diagnosis Date   AAA (abdominal aortic aneurysm) (HCC)    Anginal pain (HCC)    Arthritis    CAD (coronary artery disease)    Adam [name] IS CARDIO   CHF (congestive heart failure) (HCC)    Chronic anticoagulation    Diverticulosis    GERD (gastroesophageal reflux disease)    Hyperlipidemia    Hypertension    LBP (low back pain)    Permanent atrial fibrillation ([place])    decision made at [facility] 2015   Presence of permanent cardiac pacemaker    S/P CABG (coronary artery bypass graft)    Shortness of breath dyspnea    Sleep apnea     Medications:  Scheduled:   aspirin  324 mg Oral Once   Infusions:  PRN:   Assessment: 88 yo male presents with chest pain, troponin elevated 5428. Initial EKG with concern for ST depression in the lateral leads. Pharmacy consulted to dose IV heparin.  Last dose of eliquis '5mg'$  at 09:00 this morning. Baseline labs pending, expect anti-Xa level elevation. Hgb 12.4, Plts 147, consistent with previous results.  Goal of  Therapy:  aPTT 66-102 seconds Heparin level 0.3-0.7 units/ml Monitor platelets by anticoagulation protocol: Yes   Plan:  No heparin bolus since had eliquis this AM Start heparin infusion at 850 units/hr Continue to monitor H&H and platelets Will monitor using aPTT until effects of DOAC have diminished, then can monitor using heparin levels once the two correlate  Peggyann Juba, PharmD, BCPS Pharmacy: [phone] 08/09/2022,1:46 PM

## 2022-08-09 NOTE — ED Triage Notes (Signed)
Pt BIB [facility] from [facility] d/t Pacemaker issues & CP associated with elevated Troponins. [facility]  also reports on their monitor while en route to [facility] ED they noticed how his Pacemaker spikes where not noted or picked up intermittently, strips at bedside showing these findings.

## 2022-08-10 ENCOUNTER — Inpatient Hospital Stay (HOSPITAL_COMMUNITY): Payer: Medicare Other

## 2022-08-10 ENCOUNTER — Other Ambulatory Visit: Payer: Self-pay

## 2022-08-10 DIAGNOSIS — I214 Non-ST elevation (NSTEMI) myocardial infarction: Secondary | ICD-10-CM

## 2022-08-10 LAB — LIPID PANEL
Cholesterol: 104 mg/dL (ref 0–200)
HDL: 32 mg/dL — ABNORMAL LOW (ref >40)
Total CHOL/HDL Ratio: 3.3 RATIO
Triglycerides: 73 mg/dL (ref <150)
VLDL: 15 mg/dL (ref 0–40)

## 2022-08-10 LAB — CBC
HCT: 34.4 % — ABNORMAL LOW (ref 39.0–52.0)
Hemoglobin: 11.1 g/dL — ABNORMAL LOW (ref 13.0–17.0)
MCH: 31.1 pg (ref 26.0–34.0)
MCHC: 32.3 g/dL (ref 30.0–36.0)
MCV: 96.4 fL (ref 80.0–100.0)
Platelets: 133 10*3/uL — ABNORMAL LOW (ref 150–400)
RBC: 3.57 MIL/uL — ABNORMAL LOW (ref 4.22–5.81)
RDW: 15.5 % (ref 11.5–15.5)
WBC: 11.3 10*3/uL — ABNORMAL HIGH (ref 4.0–10.5)
nRBC: 0 % (ref 0.0–0.2)

## 2022-08-10 LAB — BASIC METABOLIC PANEL
Anion gap: 9 (ref 5–15)
BUN: 32 mg/dL — ABNORMAL HIGH (ref 8–23)
CO2: 27 mmol/L (ref 22–32)
Calcium: 8.2 mg/dL — ABNORMAL LOW (ref 8.9–10.3)
Chloride: 105 mmol/L (ref 98–111)
Creatinine, Ser: 1.59 mg/dL — ABNORMAL HIGH (ref 0.61–1.24)
GFR, Estimated: 40 mL/min — ABNORMAL LOW (ref >60)
Glucose, Bld: 122 mg/dL — ABNORMAL HIGH (ref 70–99)
Potassium: 3 mmol/L — ABNORMAL LOW (ref 3.5–5.1)
Sodium: 141 mmol/L (ref 135–145)

## 2022-08-10 LAB — ECHOCARDIOGRAM COMPLETE
Area-P 1/2: 5.52 cm2
Calc EF: 52.5 %
Height: 68 in
MV M vel: 4.56 m/s
MV Peak grad: 83.2 mmHg
MV VTI: 1.26 cm2
Radius: 0.3 cm
S' Lateral: 4.8 cm
Single Plane A2C EF: 54.5 %
Single Plane A4C EF: 46.5 %
Weight: 2480 oz

## 2022-08-10 LAB — HEPARIN LEVEL (UNFRACTIONATED): Heparin Unfractionated: >1.1 IU/mL — ABNORMAL HIGH (ref 0.30–0.70)

## 2022-08-10 LAB — APTT: aPTT: 100 seconds — ABNORMAL HIGH (ref 24–36)

## 2022-08-10 MED ORDER — POTASSIUM CHLORIDE CRYS ER 20 MEQ PO TBCR
20.0000 meq | EXTENDED_RELEASE_TABLET | Freq: Two times a day (BID) | ORAL | Status: AC
  Administered 2022-08-10 – 2022-08-11 (×3): 20 meq via ORAL
  Filled 2022-08-10 (×3): qty 1

## 2022-08-10 NOTE — Progress Notes (Signed)
Rounding Note    Patient Name: Adam [name] Date of Encounter: 08/10/2022  [place] Cardiologist: Sinclair Grooms, MD   Subjective   No further CP. No dyspnea at rest. Wife feels tha O2 has really helped him - he had marked dyspnea over the preceding 2 weeks.   Inpatient Medications    Scheduled Meds:  aspirin EC  81 mg Oral Daily   atorvastatin  80 mg Oral Daily   dapagliflozin propanediol  10 mg Oral QAC breakfast   finasteride  5 mg Oral Daily   pregabalin  75 mg Oral Q12H   tamsulosin  0.4 mg Oral QPC supper   Continuous Infusions:  heparin 850 Units/hr (08/10/22 0500)   PRN Meds: acetaminophen, nitroGLYCERIN, ondansetron (ZOFRAN) IV   Vital Signs    Vitals:   08/09/22 1851 08/09/22 2028 08/09/22 2300 08/10/22 0505  BP: (!) 146/57 135/63 102/75 128/62  Pulse: (!) 55 (!) 54 80 (!) 51  Resp: '16 18 16 16  '$ Temp: 97.6 F (36.4 C) 97.7 F (36.5 C) 97.6 F (36.4 C) 97.6 F (36.4 C)  TempSrc: Oral Oral Oral Oral  SpO2: 95% 100% 100% 99%  Weight:      Height:        Intake/Output Summary (Last 24 hours) at 08/10/2022 0746 Last data filed at 08/10/2022 0500 Gross per 24 hour  Intake 122.43 ml  Output 300 ml  Net -177.57 ml      08/09/2022   12:40 PM 08/09/2022   11:59 AM 08/03/2022   10:41 AM  Last 3 Weights  Weight (lbs) 155 lb 156 lb 156 lb 6.4 oz  Weight (kg) 70.308 kg 70.761 kg 70.943 kg      Telemetry    Intermittent ventricular pacing, atrial fib with slow ventricular response - Personally Reviewed   Physical Exam  Alert, oriented, elderly male in NAD GEN: No acute distress.   Neck: No JVD Cardiac: irregularly irregular, 2/6 systolic murmur LLSB Respiratory: Clear to auscultation bilaterally. GI: Soft, nontender, non-distended  MS: trace pretib edema, chronic stasis changes Neuro:  Nonfocal  Psych: Normal affect   Labs    High Sensitivity Troponin:   Recent Labs  Lab 08/09/22 1219 08/09/22 1342 08/09/22 1937  08/09/22 2113  TROPONINIHS 5,428* 6,499* 4,757* 3,727*     Chemistry Recent Labs  Lab 08/03/22 1124 08/09/22 1219 08/10/22 0201  NA 140 141 141  K 3.4* 3.5 3.0*  CL 100 106 105  CO2 '27 26 27  '$ GLUCOSE 103* 104* 122*  BUN 27 32* 32*  CREATININE 1.20 1.29* 1.59*  CALCIUM 8.9 8.9 8.2*  GFRNONAA  --  51* 40*  ANIONGAP  --  9 9    Lipids  Recent Labs  Lab 08/10/22 0201  CHOL 104  TRIG 73  HDL 32*  LDLCALC NOT CALCULATED  CHOLHDL 3.3    Hematology Recent Labs  Lab 08/09/22 1219 08/10/22 0201  WBC 12.8* 11.3*  RBC 3.85* 3.57*  HGB 12.1* 11.1*  HCT 37.5* 34.4*  MCV 97.4 96.4  MCH 31.4 31.1  MCHC 32.3 32.3  RDW 15.5 15.5  PLT 147* 133*   Thyroid No results for input(s): "TSH", "FREET4" in the last 168 hours.  BNP Recent Labs  Lab 08/03/22 1124 08/09/22 1219  BNP  --  1,292.9*  PROBNP 3,399*  --     DDimer No results for input(s): "DDIMER" in the last 168 hours.   Radiology    DG Chest 2 View  Result Date: 08/09/2022 CLINICAL DATA:  cp chest pain; pain by pacemaker EXAM: CHEST - 2 VIEW COMPARISON:  August 03, 2022; January 25, 2017 FINDINGS: The cardiomediastinal silhouette is unchanged in contour.Status post median sternotomy. Dual lead RIGHT chest cardiac pacing device. No pleural effusion. No pneumothorax. No acute pleuroparenchymal abnormality. Unchanged chronic interstitial markings. Partial visualization of endovascular repair of the abdominal aorta. Multilevel degenerative changes of the thoracic spine. IMPRESSION: No acute cardiopulmonary abnormality. Electronically Signed   By: Valentino Saxon M.D.   On: 08/09/2022 12:29    Cardiac Studies   2D echo pending  Patient Profile     88 y.o. male with CAD s/p remote CABG presents with NSTEMI  Assessment & Plan    NSTEMI: plan medical therapy in setting of patient's very advanced age and wishes for conservative Rx. Heparin x 48 hours. Hold on plavix in setting chronic oral anticoagulation. Not a  candidate for beta blocker with bradycardia as rates slow even with intermittent pacing. Continue high-intensity statin. Resume apixaban tomorrow. Add ASA 81 mg daily Acute on chronic HFrEF: received IV lasix yesterday. Continue farxiga. Limited med titration is setting of his BP and advanced age. Continue current Rx. Hold diuretic today as creatinine has bumped. Replete K.  Atrial fibrillation: eliquis on hold while on IV heparin. Resume  tomorrow.  Tachy-brady: pt s/p PPM  Dispo: discussed plan with family at bedside. Pt with significant NSTEMI based on enzymes, managed conservatively as above. Continue IV heparin today. Resume apixaban tomorrow. Add ASA. Mobilize in am. Assess echo result when completed. Assess for home O2 needs.  For questions or updates, please contact [place] Please consult [email or web address] for contact info under        Signed, Sherren Mocha, MD  08/10/2022, 7:46 AM

## 2022-08-10 NOTE — Progress Notes (Signed)
ANTICOAGULATION CONSULT NOTE  Pharmacy Consult for Heparin Indication: chest pain/ACS  Allergies  Allergen Reactions   Amlodipine Other (See Comments)    Wife unsure but thinks nausea was reaction   Codeine Nausea Only   Lisinopril Other (See Comments)    Elevated potassium   Morphine Nausea Only and Other (See Comments)   Amiodarone Nausea And Vomiting, Anxiety and Rash    Patient Measurements: Height: '5\' 8"'$  (172.7 cm) Weight: 70.3 kg (155 lb) IBW/kg (Calculated) : 68.4 Heparin Dosing Weight: actual weight  Vital Signs: Temp: 97.7 F (36.5 C) (12/25 0853) Temp Source: Oral (12/25 0853) BP: 126/63 (12/25 0853) Pulse Rate: 64 (12/25 0853)  Labs: Recent Labs    08/09/22 1219 08/09/22 1342 08/09/22 1937 08/09/22 2113 08/10/22 0201  HGB 12.1*  --   --   --  11.1*  HCT 37.5*  --   --   --  34.4*  PLT 147*  --   --   --  133*  APTT  --  38*  --  68* 100*  LABPROT  --  18.0*  --   --   --   INR  --  1.5*  --   --   --   HEPARINUNFRC  --  >1.10*  --   --  >1.10*  CREATININE 1.29*  --   --   --  1.59*  TROPONINIHS 5,428* 6,499* 4,757* 3,727*  --      Estimated Creatinine Clearance: 26.9 mL/min (A) (by C-G formula based on SCr of 1.59 mg/dL (H)).   Medical History: Past Medical History:  Diagnosis Date   AAA (abdominal aortic aneurysm) (HCC)    Anginal pain (HCC)    Arthritis    CAD (coronary artery disease)    Adam [name] IS CARDIO   CHF (congestive heart failure) (HCC)    Chronic anticoagulation    Diverticulosis    GERD (gastroesophageal reflux disease)    Hyperlipidemia    Hypertension    LBP (low back pain)    Permanent atrial fibrillation ([place])    decision made at [facility] 2015   Presence of permanent cardiac pacemaker    S/P CABG (coronary artery bypass graft)    Shortness of breath dyspnea    Sleep apnea     Medications:  Scheduled:   aspirin EC  81 mg Oral Daily   atorvastatin  80 mg Oral Daily   dapagliflozin propanediol  10 mg Oral QAC  breakfast   finasteride  5 mg Oral Daily   potassium chloride  20 mEq Oral BID   pregabalin  75 mg Oral Q12H   tamsulosin  0.4 mg Oral QPC supper   Infusions:   heparin 850 Units/hr (08/10/22 0500)   PRN:   Assessment: 88 yo male presents with chest pain, troponin elevated 5428. Initial EKG with concern for ST depression in the lateral leads. Pharmacy consulted to dose IV heparin.  Last dose of eliquis '5mg'$  at 09:00 on 12/24. Heparin level elevated >1.1 as expected, aPTT therapeutic at 100 sec. Hgb 11.1, plt 133. No issues with infusion reported, no signs/symptoms of bleeding noted.  Goal of Therapy:  aPTT 66-102 seconds Heparin level 0.3-0.7 units/ml Monitor platelets by anticoagulation protocol: Yes   Plan:  Continue heparin infusion at 850 units/hr Continue to monitor H&H and platelets Will monitor using aPTT until effects of DOAC have diminished, then can monitor using heparin levels once the two correlate  Louanne Belton, PharmD, [facility] PGY1 Pharmacy Resident 08/10/2022 10:29 AM

## 2022-08-10 NOTE — Progress Notes (Signed)
SATURATION QUALIFICATIONS:  Patient Saturations on Room Air at Rest = 88%  Patient Saturations on Room Air while Ambulating = 80%  Patient Saturations on 2 Liters of oxygen while Ambulating = 95%  Please briefly explain why patient needs home oxygen: patient's oxygen saturations decreased to  80% on room air with ambulation.

## 2022-08-10 NOTE — Progress Notes (Signed)
  Echocardiogram 2D Echocardiogram has been performed.  [name] M 08/10/2022, 9:06 AM

## 2022-08-10 NOTE — Progress Notes (Signed)
  Echocardiogram 2D Echocardiogram has been performed.  [name] M 08/10/2022, 9:04 AM

## 2022-08-11 ENCOUNTER — Other Ambulatory Visit (HOSPITAL_COMMUNITY): Payer: Self-pay

## 2022-08-11 DIAGNOSIS — I214 Non-ST elevation (NSTEMI) myocardial infarction: Secondary | ICD-10-CM | POA: Diagnosis not present

## 2022-08-11 LAB — CBC
HCT: 36.3 % — ABNORMAL LOW (ref 39.0–52.0)
Hemoglobin: 11.9 g/dL — ABNORMAL LOW (ref 13.0–17.0)
MCH: 31.8 pg (ref 26.0–34.0)
MCHC: 32.8 g/dL (ref 30.0–36.0)
MCV: 97.1 fL (ref 80.0–100.0)
Platelets: 139 10*3/uL — ABNORMAL LOW (ref 150–400)
RBC: 3.74 MIL/uL — ABNORMAL LOW (ref 4.22–5.81)
RDW: 15.7 % — ABNORMAL HIGH (ref 11.5–15.5)
WBC: 12.2 10*3/uL — ABNORMAL HIGH (ref 4.0–10.5)
nRBC: 0 % (ref 0.0–0.2)

## 2022-08-11 LAB — BASIC METABOLIC PANEL
Anion gap: 8 (ref 5–15)
BUN: 28 mg/dL — ABNORMAL HIGH (ref 8–23)
CO2: 23 mmol/L (ref 22–32)
Calcium: 8.7 mg/dL — ABNORMAL LOW (ref 8.9–10.3)
Chloride: 106 mmol/L (ref 98–111)
Creatinine, Ser: 1.08 mg/dL (ref 0.61–1.24)
GFR, Estimated: >60 mL/min (ref >60)
Glucose, Bld: 113 mg/dL — ABNORMAL HIGH (ref 70–99)
Potassium: 4 mmol/L (ref 3.5–5.1)
Sodium: 137 mmol/L (ref 135–145)

## 2022-08-11 LAB — HEPARIN LEVEL (UNFRACTIONATED): Heparin Unfractionated: 0.87 IU/mL — ABNORMAL HIGH (ref 0.30–0.70)

## 2022-08-11 LAB — APTT: aPTT: 85 seconds — ABNORMAL HIGH (ref 24–36)

## 2022-08-11 MED ORDER — APIXABAN 5 MG PO TABS
5.0000 mg | ORAL_TABLET | Freq: Two times a day (BID) | ORAL | Status: DC
  Administered 2022-08-11: 5 mg via ORAL
  Filled 2022-08-11: qty 1

## 2022-08-11 MED ORDER — ATORVASTATIN CALCIUM 80 MG PO TABS
80.0000 mg | ORAL_TABLET | Freq: Every day | ORAL | 1 refills | Status: DC
  Filled 2022-08-11: qty 90, 90d supply, fill #0

## 2022-08-11 MED ORDER — ASPIRIN 81 MG PO TBEC
81.0000 mg | DELAYED_RELEASE_TABLET | Freq: Every day | ORAL | 1 refills | Status: DC
  Filled 2022-08-11: qty 90, 90d supply, fill #0

## 2022-08-11 NOTE — TOC Transition Note (Signed)
Transition of Care [facility]) - CM/SW Discharge Note   Patient Details  Name: Adam [name] [name]: [id number] Date of Birth: 04-Jul-1925  Transition of Care [facility]) CM/SW Contact:  Bartholomew Crews, RN Phone Number: [phone] 08/11/2022, 11:13 AM   Clinical Narrative:     Spoke with patient and spouse at the bedside to discuss post acute transition. PTA home with spouse. Discussed needing home oxygen. Offered choice of agency provider. No preference. Referral called to [place]. [employer] to provide oxygen to bedside for transportation home, then do home set up later today. Family to provide transportation home. No further TOC needs identified at this time.   Final next level of care: Home/Self Care Barriers to Discharge: No Barriers Identified   Patient Goals and CMS Choice CMS Medicare.gov Compare Post Acute Care list provided to:: Patient Choice offered to / list presented to : Patient, Spouse  Discharge Placement                         Discharge Plan and Services Additional resources added to the After Visit Summary for                  DME Arranged: Oxygen DME Agency: [facility] Date DME Agency Contacted: 08/11/22 Time DME Agency Contacted: [id number] Representative spoke with at DME Agency: [place]: NA [place] Agency: NA        Social Determinants of Health ([place]) Interventions [place]: No Food Insecurity (08/10/2022)  Housing: Low Risk  (08/10/2022)  Transportation Needs: No Transportation Needs (08/10/2022)  Utilities: Not At Risk (08/10/2022)  Tobacco Use: Medium Risk (08/09/2022)     Readmission Risk Interventions     No data to display

## 2022-08-11 NOTE — Progress Notes (Signed)
Mobility Specialist - Progress Note   08/11/22 1432  Mobility  Activity Ambulated with assistance to bathroom  Level of Assistance Minimal assist, patient does 75% or more  Assistive Device Front wheel walker  Distance Ambulated (ft) 10 ft  Activity Response Tolerated well  Mobility Referral Yes  $Mobility charge 1 Mobility   Pt received in bed and requesting assistance to BR. Pt was MinA throughout ambulation. Pt was left in BR with all needs meet and instructed to use call bell when finished.   Franki Monte  Mobility Specialist Please contact via Solicitor or Rehab office at [phone]

## 2022-08-11 NOTE — Discharge Summary (Addendum)
Discharge Summary    Patient ID: ZADIEL LEYH [name]: [id number]; DOB: 04-17-1925  Admit date: 08/09/2022 Discharge date: 08/11/2022  PCP:  [name], [place] Providers Cardiologist:  Sinclair Grooms, MD      Discharge Diagnoses    Principal Problem:   NSTEMI (non-ST elevated myocardial infarction) [facility]) Active Problems:   Chronic atrial fibrillation ([place])   Bradycardia   Hyperlipidemia  Diagnostic Studies/Procedures    Echo: 08/10/2022   IMPRESSIONS     1. Left ventricular ejection fraction, by estimation, is 50 to 55%. Left  ventricular ejection fraction by 2D MOD biplane is 52.5 %. The left  ventricle has low normal function. The left ventricle demonstrates global  hypokinesis. Left ventricular  diastolic parameters are indeterminate.   2. Right ventricular systolic function is low normal. The right  ventricular size is normal. There is severely elevated pulmonary artery  systolic pressure.   3. Left atrial size was severely dilated.   4. Right atrial size was moderately dilated.   5. The mitral valve is degenerative. Mild mitral valve regurgitation. No  evidence of mitral stenosis.   6. Tricuspid valve regurgitation is moderate.   7. The aortic valve is tricuspid. There is mild calcification of the  aortic valve. There is mild thickening of the aortic valve. Aortic valve  regurgitation is trivial. Aortic valve sclerosis/calcification is present,  without any evidence of aortic  stenosis.   8. The inferior vena cava is dilated in size with <50% respiratory  variability, suggesting right atrial pressure of 15 mmHg.   FINDINGS   Left Ventricle: Left ventricular ejection fraction, by estimation, is 50  to 55%. Left ventricular ejection fraction by 2D MOD biplane is 52.5 %.  The left ventricle has low normal function. The left ventricle  demonstrates global hypokinesis. The left  ventricular internal cavity size was normal in size.  There is no left  ventricular hypertrophy. Left ventricular diastolic parameters are  indeterminate.   Right Ventricle: The right ventricular size is normal. No increase in  right ventricular wall thickness. Right ventricular systolic function is  low normal. There is severely elevated pulmonary artery systolic pressure.  The tricuspid regurgitant velocity  is 3.51 m/s, and with an assumed right atrial pressure of 15 mmHg, the  estimated right ventricular systolic pressure is 10.2 mmHg.   Left Atrium: Left atrial size was severely dilated.   Right Atrium: Right atrial size was moderately dilated.   Pericardium: There is no evidence of pericardial effusion.   Mitral Valve: The mitral valve is degenerative in appearance. There is  mild thickening of the mitral valve leaflet(s). Mild mitral annular  calcification. Mild mitral valve regurgitation. No evidence of mitral  valve stenosis. MV peak gradient, 3.5 mmHg.  The mean mitral valve gradient is 1.0 mmHg.   Tricuspid Valve: The tricuspid valve is grossly normal. Tricuspid valve  regurgitation is moderate . No evidence of tricuspid stenosis.   Aortic Valve: The aortic valve is tricuspid. There is mild calcification  of the aortic valve. There is mild thickening of the aortic valve. There  is mild aortic valve annular calcification. Aortic valve regurgitation is  trivial. Aortic valve  sclerosis/calcification is present, without any evidence of aortic  stenosis.   Pulmonic Valve: The pulmonic valve was grossly normal. Pulmonic valve  regurgitation is mild. No evidence of pulmonic stenosis.   Aorta: The aortic root and ascending aorta are structurally normal, with  no evidence of  dilitation.   Venous: The inferior vena cava is dilated in size with less than 50%  respiratory variability, suggesting right atrial pressure of 15 mmHg.   IAS/Shunts: No atrial level shunt detected by color flow Doppler.   Additional Comments: A  device lead is visualized.  _____________   History of Present Illness     DEMARIS LEAVELL is a 88 y.o. 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 Initermittent symptoms over the last 3 weeks. Central chest pressure with associated SOB.    K 3.5 Cr 1.29 BUN 32 WBC 12.8 Hgb 12.1 Plt 147 BNP 1292 Trop 5428--> CXR no acute process Junctional rhythm, anterior/anterolateral ST depression   Prior echo 03/2022  - Left ventricle: The cavity size was normal. Wall thickness was    normal. Indeterminant diastolic function (atrial fibrillation).    The estimated ejection fraction was 35%. Diffuse hypokinesis.  - Aortic valve: There was no stenosis. There was trivial    regurgitation.  - Mitral valve: Mildly calcified annulus. There was mild    regurgitation.  - Left atrium: The atrium was moderately dilated.  - Right ventricle: The cavity size was normal. Systolic function    was mildly reduced.  - Right atrium: The atrium was moderately dilated.  - Tricuspid valve: There was severe regurgitation. There was    systolic flow reversal in the hepatic vein doppler signal. Peak    RV-RA gradient (S): 50 mm Hg.  - Pulmonary arteries: PA peak pressure: 65 mm Hg (S).  - Systemic veins: IVC measured 2.5 cm with < 50% respirophasic    variation, suggesting RA pressure 15 mmHg.      03/2014 cath: LM diffuse narrowing, LAD occluded, LCX 90% ostial, occluded RCA with right to right collaterals. SVG-OM occluded chronically since 1990s, SVG-diag patent, SVG-distal RCA patent, LIMA-LAD patent  Given symptoms, along with elevated troponin he was admitted to cardiology for further management.   Hospital Course     NSTEMI -- hsTn peaked at 6499, being managed medically per patient/family wishes. Patient confirmed DNR status. Was treated with IV heparin for 48hrs, and transitioned back to Eliquis '5mg'$  BID. Echo showed  LVEF of 50-55%, global hypokinesis, severely elevated PA pressure, severely dilated LA, moderately dilated RA., moderate TR -- was able to ambulate in the hallway without recurrent chest pain, though O2 sats low on RA. Home O2 ordered at DC -- continue ASA, statin, no BB with bradycardia. No plavix with need for [place]   HFrEF, now improved EF of 50-55% -- hx of reduced LVEF of 35% from 2017, echo this admission actually improved LV function of 50-55%. BNP 1292 on admission. -- s/p IV lasix, net - 560cc, 1.3L UOP prior to DC -- continue farxiga  -- resumed home torsemide at DC   Permanent Afib Tachy-brady s/p PPM -- Eliquis resumed prior to DC -- remains in afib with controlled rates, intermittent pacing   Patient was seen by Dr. Angelena Form and deemed stable for discharge home. Follow up in the office arranged. Medications sent to the [facility] pharmacy.  Did the patient have an acute coronary syndrome (MI, NSTEMI, STEMI, etc) this admission?:  Yes                               AHA/ACC Clinical Performance & Quality Measures: Aspirin prescribed? - Yes ADP  Receptor Inhibitor (Plavix/Clopidogrel, Brilinta/Ticagrelor or Effient/Prasugrel) prescribed (includes medically managed patients)? - No - monotherapy with ASA given the need for [facility] Beta Blocker prescribed? - No - bradycardia High Intensity Statin (Lipitor 40-'80mg'$  or Crestor 20-'40mg'$ ) prescribed? - Yes EF assessed during THIS hospitalization? - Yes For EF <40%, was ACEI/ARB prescribed? - Not Applicable (EF >/= 20%) For EF <40%, Aldosterone Antagonist (Spironolactone or Eplerenone) prescribed? - Not Applicable (EF >/= 25%) Cardiac Rehab Phase II ordered (including medically managed patients)? - No - not a candidate given advanced age, medical therapy       The patient will be scheduled for a TOC follow up appointment in 10-14 days.  A message has been sent to the [facility] and Scheduling Pool at the office where the patient should be seen for  follow up.  _____________  Discharge Vitals Blood pressure 121/62, pulse (!) 48, temperature 97.9 F (36.6 C), temperature source Oral, resp. rate 18, height '5\' 8"'$  (1.727 m), weight 70.3 kg, SpO2 98 %.  Filed Weights   08/09/22 1159 08/09/22 1240  Weight: 70.8 kg 70.3 kg    Labs & Radiologic Studies    CBC Recent Labs    08/10/22 0201 08/11/22 0216  WBC 11.3* 12.2*  HGB 11.1* 11.9*  HCT 34.4* 36.3*  MCV 96.4 97.1  PLT 133* 427*   Basic Metabolic Panel Recent Labs    08/10/22 0201 08/11/22 0216  NA 141 137  K 3.0* 4.0  CL 105 106  CO2 27 23  GLUCOSE 122* 113*  BUN 32* 28*  CREATININE 1.59* 1.08  CALCIUM 8.2* 8.7*   Liver Function Tests No results for input(s): "AST", "ALT", "ALKPHOS", "BILITOT", "PROT", "ALBUMIN" in the last 72 hours. No results for input(s): "LIPASE", "AMYLASE" in the last 72 hours. High Sensitivity Troponin:   Recent Labs  Lab 08/09/22 1219 08/09/22 1342 08/09/22 1937 08/09/22 2113  TROPONINIHS 5,428* 6,499* 4,757* 3,727*    BNP Invalid input(s): "POCBNP" D-Dimer No results for input(s): "DDIMER" in the last 72 hours. Hemoglobin A1C No results for input(s): "HGBA1C" in the last 72 hours. Fasting Lipid Panel Recent Labs    08/10/22 0201  CHOL 104  HDL 32*  LDLCALC NOT CALCULATED  TRIG 73  CHOLHDL 3.3   Thyroid Function Tests No results for input(s): "TSH", "T4TOTAL", "T3FREE", "THYROIDAB" in the last 72 hours.  Invalid input(s): "FREET3" _____________  ECHOCARDIOGRAM COMPLETE  Result Date: 08/10/2022    ECHOCARDIOGRAM REPORT   Patient Name:   EREN RYSER Date of Exam: 08/10/2022 Medical Rec #:  [id number]       Height:       68.0 in Accession #:    [id number]      Weight:       155.0 lb Date of Birth:  12/30/1924        BSA:          1.834 m Patient Age:    89 years        BP:           114/54 mmHg Patient Gender: M               HR:           56 bpm. Exam Location:  Inpatient Procedure: 2D Echo, Cardiac Doppler and Color  Doppler Indications:    NSTEMI I21.4  History:        Patient has prior history of Echocardiogram examinations. CHF,  CAD, Prior CABG and Pacemaker, Arrythmias:Atrial Fibrillation                 and Tachy-Brady syndrome; Risk Factors:Hypertension and                 Dyslipidemia. AAA.  Sonographer:    [name] RDCS Referring Phys: [id number] [place]  1. Left ventricular ejection fraction, by estimation, is 50 to 55%. Left ventricular ejection fraction by 2D MOD biplane is 52.5 %. The left ventricle has low normal function. The left ventricle demonstrates global hypokinesis. Left ventricular diastolic parameters are indeterminate.  2. Right ventricular systolic function is low normal. The right ventricular size is normal. There is severely elevated pulmonary artery systolic pressure.  3. Left atrial size was severely dilated.  4. Right atrial size was moderately dilated.  5. The mitral valve is degenerative. Mild mitral valve regurgitation. No evidence of mitral stenosis.  6. Tricuspid valve regurgitation is moderate.  7. The aortic valve is tricuspid. There is mild calcification of the aortic valve. There is mild thickening of the aortic valve. Aortic valve regurgitation is trivial. Aortic valve sclerosis/calcification is present, without any evidence of aortic stenosis.  8. The inferior vena cava is dilated in size with <50% respiratory variability, suggesting right atrial pressure of 15 mmHg. FINDINGS  Left Ventricle: Left ventricular ejection fraction, by estimation, is 50 to 55%. Left ventricular ejection fraction by 2D MOD biplane is 52.5 %. The left ventricle has low normal function. The left ventricle demonstrates global hypokinesis. The left ventricular internal cavity size was normal in size. There is no left ventricular hypertrophy. Left ventricular diastolic parameters are indeterminate. Right Ventricle: The right ventricular size is normal. No increase in right  ventricular wall thickness. Right ventricular systolic function is low normal. There is severely elevated pulmonary artery systolic pressure. The tricuspid regurgitant velocity is 3.51 m/s, and with an assumed right atrial pressure of 15 mmHg, the estimated right ventricular systolic pressure is 62.8 mmHg. Left Atrium: Left atrial size was severely dilated. Right Atrium: Right atrial size was moderately dilated. Pericardium: There is no evidence of pericardial effusion. Mitral Valve: The mitral valve is degenerative in appearance. There is mild thickening of the mitral valve leaflet(s). Mild mitral annular calcification. Mild mitral valve regurgitation. No evidence of mitral valve stenosis. MV peak gradient, 3.5 mmHg. The mean mitral valve gradient is 1.0 mmHg. Tricuspid Valve: The tricuspid valve is grossly normal. Tricuspid valve regurgitation is moderate . No evidence of tricuspid stenosis. Aortic Valve: The aortic valve is tricuspid. There is mild calcification of the aortic valve. There is mild thickening of the aortic valve. There is mild aortic valve annular calcification. Aortic valve regurgitation is trivial. Aortic valve sclerosis/calcification is present, without any evidence of aortic stenosis. Pulmonic Valve: The pulmonic valve was grossly normal. Pulmonic valve regurgitation is mild. No evidence of pulmonic stenosis. Aorta: The aortic root and ascending aorta are structurally normal, with no evidence of dilitation. Venous: The inferior vena cava is dilated in size with less than 50% respiratory variability, suggesting right atrial pressure of 15 mmHg. IAS/Shunts: No atrial level shunt detected by color flow Doppler. Additional Comments: A device lead is visualized.  LEFT VENTRICLE PLAX 2D                        Biplane EF (MOD) LVIDd:         5.70 cm         LV  Biplane EF:   Left LVIDs:         4.80 cm                          ventricular LV PW:         0.70 cm                          ejection LV IVS:         0.80 cm                          fraction by LVOT diam:     2.10 cm                          2D MOD LV SV:         39                               biplane is LV SV Index:   21                               52.5 %. LVOT Area:     3.46 cm                                Diastology                                LV e' medial:    6.28 cm/s LV Volumes (MOD)               LV E/e' medial:  13.8 LV vol d, MOD    129.0 ml      LV e' lateral:   8.32 cm/s A2C:                           LV E/e' lateral: 10.4 LV vol d, MOD    116.0 ml A4C: LV vol s, MOD    58.7 ml A2C: LV vol s, MOD    62.1 ml A4C: LV SV MOD A2C:   70.3 ml LV SV MOD A4C:   116.0 ml LV SV MOD BP:    67.0 ml RIGHT VENTRICLE RV S prime:     10.50 cm/s TAPSE (M-mode): 1.2 cm LEFT ATRIUM             Index        RIGHT ATRIUM           Index LA diam:        5.65 cm 3.08 cm/m   RA Area:     29.90 cm LA Vol (A2C):   66.6 ml 36.32 ml/m  RA Volume:   102.00 ml 55.62 ml/m LA Vol (A4C):   91.6 ml 49.95 ml/m LA Biplane Vol: 82.0 ml 44.71 ml/m  AORTIC VALVE LVOT Vmax:   61.50 cm/s LVOT Vmean:  33.600 cm/s LVOT VTI:    0.112 m  AORTA Ao Root diam: 3.30 cm Ao Asc diam:  3.30 cm MITRAL VALVE  TRICUSPID VALVE MV Area (PHT): 5.52 cm       TV Peak grad:   49.6 mmHg MV Area VTI:   1.26 cm       TV Vmax:        3.52 m/s MV Peak grad:  3.5 mmHg       TR Peak grad:   49.3 mmHg MV Mean grad:  1.0 mmHg       TR Vmax:        351.00 cm/s MV Vmax:       0.94 m/s MV Vmean:      47.2 cm/s      SHUNTS MV Decel Time: 138 msec       Systemic VTI:  0.11 m MR Peak grad:    83.2 mmHg    Systemic Diam: 2.10 cm MR Mean grad:    54.0 mmHg MR Vmax:         456.00 cm/s MR Vmean:        348.0 cm/s MR PISA:         0.57 cm MR PISA Eff ROA: 3 mm MR PISA Radius:  0.30 cm MV E velocity: 86.50 cm/s Skeet Latch MD Electronically signed by Skeet Latch MD Signature Date/Time: 08/10/2022/9:58:25 AM    Final    DG Chest 2 View  Result Date: 08/09/2022 CLINICAL DATA:   cp chest pain; pain by pacemaker EXAM: CHEST - 2 VIEW COMPARISON:  August 03, 2022; January 25, 2017 FINDINGS: The cardiomediastinal silhouette is unchanged in contour.Status post median sternotomy. Dual lead RIGHT chest cardiac pacing device. No pleural effusion. No pneumothorax. No acute pleuroparenchymal abnormality. Unchanged chronic interstitial markings. Partial visualization of endovascular repair of the abdominal aorta. Multilevel degenerative changes of the thoracic spine. IMPRESSION: No acute cardiopulmonary abnormality. Electronically Signed   By: Valentino Saxon M.D.   On: 08/09/2022 12:29   DG Chest 2 View  Result Date: 08/03/2022 CLINICAL DATA:  Fall 3 weeks ago onto RIGHT side, shortness of breath with RIGHT-sided bruising. EXAM: CHEST - 2 VIEW COMPARISON:  Chest x-rays dated 01/25/2017 and 01/20/2017. FINDINGS: Heart size and mediastinal contours are stable. RIGHT chest wall pacemaker/ICD apparatus in place. Median sternotomy wires appear intact and stable in alignment. Lungs appear clear. No pleural effusion or pneumothorax is seen. No acute-appearing osseous abnormality. IMPRESSION: No acute findings. Lungs are clear. No pleural effusion or pneumothorax is seen. Electronically Signed   By: Franki Cabot M.D.   On: 08/03/2022 15:27   Disposition   Pt is being discharged home today in good condition.  Follow-up Plans & Appointments     Follow-up Information     Emmaline Life, NP Follow up on 08/20/2022.   Specialty: Cardiology Why: at 2:20pm for your follow up appt with Dr. Darliss Ridgel' NP Natasha Mead information: [place] [address] [phone]                Discharge Instructions     Call MD for:  difficulty breathing, headache or visual disturbances   Complete by: As directed    Call MD for:  persistant dizziness or light-headedness   Complete by: As directed    Diet - low sodium heart healthy   Complete by: As directed     For home use only DME oxygen   Complete by: As directed    Length of Need: 6 Months   Mode or (Route): Nasal cannula   Liters per Minute: 2   Frequency: Continuous (  stationary and portable oxygen unit needed)   Oxygen delivery system: Gas   Increase activity slowly   Complete by: As directed         Discharge Medications   Allergies as of 08/11/2022       Reactions   Amlodipine Other (See Comments)   Wife unsure but thinks nausea was reaction   Codeine Nausea Only   Lisinopril Other (See Comments)   Elevated potassium   Morphine Nausea Only, Other (See Comments)   Amiodarone Nausea And Vomiting, Anxiety, Rash        Medication List     TAKE these medications    acetaminophen 650 MG CR tablet Commonly known as: TYLENOL Take 1,300 mg by mouth as needed for pain.   allopurinol 300 MG tablet Commonly known as: ZYLOPRIM Take 300 mg by mouth daily as needed (gout).   apixaban 5 MG Tabs tablet Commonly known as: ELIQUIS Take 1 tablet (5 mg total) by mouth 2 (two) times daily.   aspirin EC 81 MG tablet Take 1 tablet (81 mg total) by mouth daily. Swallow whole.   atorvastatin 80 MG tablet Commonly known as: LIPITOR Take 1 tablet (80 mg total) by mouth daily. What changed:  medication strength how much to take when to take this   dapagliflozin propanediol 10 MG Tabs tablet Commonly known as: Farxiga Take 1 tablet (10 mg total) by mouth daily before breakfast.   dorzolamide 2 % ophthalmic solution Commonly known as: TRUSOPT Place 1 drop into the right eye 2 (two) times daily.   finasteride 5 MG tablet Commonly known as: PROSCAR Take 5 mg by mouth daily.   latanoprost 0.005 % ophthalmic solution Commonly known as: XALATAN Place 1 drop into both eyes at bedtime.   nitroGLYCERIN 0.4 MG SL tablet Commonly known as: NITROSTAT PLACE 1 TABLET UNDER THE TONGUE EVERY 5 MINUTES AS NEEDED FOR CHEST PAIN What changed: See the new instructions.   pregabalin 75  MG capsule Commonly known as: LYRICA Take 75 mg by mouth every 12 (twelve) hours.   PRESERVISION AREDS PO Take 1 tablet by mouth in the morning and at bedtime.   silodosin 8 MG Caps capsule Commonly known as: RAPAFLO Take 8 mg by mouth at bedtime.   torsemide 20 MG tablet Commonly known as: DEMADEX Take 2 tablets (40 mg total) by mouth daily. What changed:  how much to take when to take this   VITAMIN D PO Take 1 capsule by mouth daily.               Durable Medical Equipment  (From admission, onward)           Start     Ordered   08/11/22 0000  For home use only DME oxygen       Question Answer Comment  Length of Need 6 Months   Mode or (Route) Nasal cannula   Liters per Minute 2   Frequency Continuous (stationary and portable oxygen unit needed)   Oxygen delivery system Gas      08/11/22 0933            Outstanding Labs/Studies   FLP/LFTs in 8 weeks   Duration of Discharge Encounter   Greater than 30 minutes including physician time.  Signed, Reino Bellis, NP 08/11/2022, 9:53 AM    I have personally seen and examined this patient. I agree with the assessment and plan as outlined above.  See my full note this am.   Lauree Chandler,  MD, [facility] 08/11/2022 9:59 AM

## 2022-08-11 NOTE — Progress Notes (Signed)
Nurse requested Mobility Specialist to perform oxygen saturation test with pt which includes removing pt from oxygen both at rest and while ambulating.  Below are the results from that testing.     Patient Saturations on Room Air at Rest = spO2 98%  Patient Saturations on Room Air while Ambulating = sp02 84% .  Rested and performed pursed lip breathing for 1 minute with sp02 at 86%.  Patient Saturations on 2 Liters of oxygen while Ambulating = sp02 95%  At end of testing pt left in room on 2  Liters of oxygen.  Reported results to nurse.

## 2022-08-11 NOTE — Progress Notes (Addendum)
Rounding Note    Patient Name: Adam [name] Date of Encounter: 08/11/2022  [place] Cardiologist: Sinclair Grooms, MD   Subjective   Had a rough night.   Inpatient Medications    Scheduled Meds:  aspirin EC  81 mg Oral Daily   atorvastatin  80 mg Oral Daily   dapagliflozin propanediol  10 mg Oral QAC breakfast   finasteride  5 mg Oral Daily   potassium chloride  20 mEq Oral BID   pregabalin  75 mg Oral Q12H   tamsulosin  0.4 mg Oral QPC supper   Continuous Infusions:  heparin 850 Units/hr (08/10/22 2000)   PRN Meds: acetaminophen, nitroGLYCERIN, ondansetron (ZOFRAN) IV   Vital Signs    Vitals:   08/10/22 1529 08/10/22 1912 08/11/22 0358 08/11/22 0804  BP: 128/74 (!) 130/56 126/67 121/62  Pulse: 78 (!) 58 (!) 53 (!) 48  Resp: '18 19 19 18  '$ Temp:  98.1 F (36.7 C) 98.1 F (36.7 C) 97.9 F (36.6 C)  TempSrc:  Oral Oral Oral  SpO2: 95% 100% 97% 98%  Weight:      Height:        Intake/Output Summary (Last 24 hours) at 08/11/2022 0820 Last data filed at 08/11/2022 0359 Gross per 24 hour  Intake 727.5 ml  Output 1350 ml  Net -622.5 ml      08/09/2022   12:40 PM 08/09/2022   11:59 AM 08/03/2022   10:41 AM  Last 3 Weights  Weight (lbs) 155 lb 156 lb 156 lb 6.4 oz  Weight (kg) 70.308 kg 70.761 kg 70.943 kg      Telemetry    Afib rates 50s - Personally Reviewed  ECG    No new tracing this morning.  Physical Exam   Elderly older MW, sitting up in bed.  GEN: No acute distress.   Neck: No JVD Cardiac: Irreg Irreg, 2/6 systolic murmur, no rubs, or gallops.  Respiratory: Clear to auscultation bilaterally. GI: Soft, nontender, non-distended  MS: No edema; No deformity. Neuro:  Nonfocal  Psych: Normal affect   Labs    High Sensitivity Troponin:   Recent Labs  Lab 08/09/22 1219 08/09/22 1342 08/09/22 1937 08/09/22 2113  TROPONINIHS 5,428* 6,499* 4,757* 3,727*     Chemistry Recent Labs  Lab 08/09/22 1219 08/10/22 0201  08/11/22 0216  NA 141 141 137  K 3.5 3.0* 4.0  CL 106 105 106  CO2 '26 27 23  '$ GLUCOSE 104* 122* 113*  BUN 32* 32* 28*  CREATININE 1.29* 1.59* 1.08  CALCIUM 8.9 8.2* 8.7*  GFRNONAA 51* 40* >60  ANIONGAP '9 9 8    '$ Lipids  Recent Labs  Lab 08/10/22 0201  CHOL 104  TRIG 73  HDL 32*  LDLCALC NOT CALCULATED  CHOLHDL 3.3    Hematology Recent Labs  Lab 08/09/22 1219 08/10/22 0201 08/11/22 0216  WBC 12.8* 11.3* 12.2*  RBC 3.85* 3.57* 3.74*  HGB 12.1* 11.1* 11.9*  HCT 37.5* 34.4* 36.3*  MCV 97.4 96.4 97.1  MCH 31.4 31.1 31.8  MCHC 32.3 32.3 32.8  RDW 15.5 15.5 15.7*  PLT 147* 133* 139*   Thyroid No results for input(s): "TSH", "FREET4" in the last 168 hours.  BNP Recent Labs  Lab 08/09/22 1219  BNP 1,292.9*    DDimer No results for input(s): "DDIMER" in the last 168 hours.   Radiology    ECHOCARDIOGRAM COMPLETE  Result Date: 08/10/2022    ECHOCARDIOGRAM REPORT   Patient Name:  Adam [name] Date of Exam: 08/10/2022 Medical Rec #:  [id number]       Height:       68.0 in Accession #:    [id number]      Weight:       155.0 lb Date of Birth:  [birth year]        BSA:          1.834 m Patient Age:    88 years        BP:           114/54 mmHg Patient Gender: M               HR:           56 bpm. Exam Location:  Inpatient Procedure: 2D Echo, Cardiac Doppler and Color Doppler Indications:    NSTEMI I21.4  History:        Patient has prior history of Echocardiogram examinations. CHF,                 CAD, Prior CABG and Pacemaker, Arrythmias:Atrial Fibrillation                 and Tachy-Brady syndrome; Risk Factors:Hypertension and                 Dyslipidemia. AAA.  Sonographer:    [name] RDCS Referring Phys: [id number] [place]  1. Left ventricular ejection fraction, by estimation, is 50 to 55%. Left ventricular ejection fraction by 2D MOD biplane is 52.5 %. The left ventricle has low normal function. The left ventricle demonstrates global hypokinesis. Left  ventricular diastolic parameters are indeterminate.  2. Right ventricular systolic function is low normal. The right ventricular size is normal. There is severely elevated pulmonary artery systolic pressure.  3. Left atrial size was severely dilated.  4. Right atrial size was moderately dilated.  5. The mitral valve is degenerative. Mild mitral valve regurgitation. No evidence of mitral stenosis.  6. Tricuspid valve regurgitation is moderate.  7. The aortic valve is tricuspid. There is mild calcification of the aortic valve. There is mild thickening of the aortic valve. Aortic valve regurgitation is trivial. Aortic valve sclerosis/calcification is present, without any evidence of aortic stenosis.  8. The inferior vena cava is dilated in size with <50% respiratory variability, suggesting right atrial pressure of 15 mmHg. FINDINGS  Left Ventricle: Left ventricular ejection fraction, by estimation, is 50 to 55%. Left ventricular ejection fraction by 2D MOD biplane is 52.5 %. The left ventricle has low normal function. The left ventricle demonstrates global hypokinesis. The left ventricular internal cavity size was normal in size. There is no left ventricular hypertrophy. Left ventricular diastolic parameters are indeterminate. Right Ventricle: The right ventricular size is normal. No increase in right ventricular wall thickness. Right ventricular systolic function is low normal. There is severely elevated pulmonary artery systolic pressure. The tricuspid regurgitant velocity is 3.51 m/s, and with an assumed right atrial pressure of 15 mmHg, the estimated right ventricular systolic pressure is 32.4 mmHg. Left Atrium: Left atrial size was severely dilated. Right Atrium: Right atrial size was moderately dilated. Pericardium: There is no evidence of pericardial effusion. Mitral Valve: The mitral valve is degenerative in appearance. There is mild thickening of the mitral valve leaflet(s). Mild mitral annular calcification.  Mild mitral valve regurgitation. No evidence of mitral valve stenosis. MV peak gradient, 3.5 mmHg. The mean mitral valve gradient is 1.0 mmHg. Tricuspid Valve: The tricuspid valve is grossly normal.  Tricuspid valve regurgitation is moderate . No evidence of tricuspid stenosis. Aortic Valve: The aortic valve is tricuspid. There is mild calcification of the aortic valve. There is mild thickening of the aortic valve. There is mild aortic valve annular calcification. Aortic valve regurgitation is trivial. Aortic valve sclerosis/calcification is present, without any evidence of aortic stenosis. Pulmonic Valve: The pulmonic valve was grossly normal. Pulmonic valve regurgitation is mild. No evidence of pulmonic stenosis. Aorta: The aortic root and ascending aorta are structurally normal, with no evidence of dilitation. Venous: The inferior vena cava is dilated in size with less than 50% respiratory variability, suggesting right atrial pressure of 15 mmHg. IAS/Shunts: No atrial level shunt detected by color flow Doppler. Additional Comments: A device lead is visualized.  LEFT VENTRICLE PLAX 2D                        Biplane EF (MOD) LVIDd:         5.70 cm         LV Biplane EF:   Left LVIDs:         4.80 cm                          ventricular LV PW:         0.70 cm                          ejection LV IVS:        0.80 cm                          fraction by LVOT diam:     2.10 cm                          2D MOD LV SV:         39                               biplane is LV SV Index:   21                               52.5 %. LVOT Area:     3.46 cm                                Diastology                                LV e' medial:    6.28 cm/s LV Volumes (MOD)               LV E/e' medial:  13.8 LV vol d, MOD    129.0 ml      LV e' lateral:   8.32 cm/s A2C:                           LV E/e' lateral: 10.4 LV vol d, MOD    116.0 ml A4C: LV vol s, MOD    58.7 ml A2C: LV vol s, MOD    62.1 ml A4C: LV SV MOD  A2C:   70.3 ml LV  SV MOD A4C:   116.0 ml LV SV MOD BP:    67.0 ml RIGHT VENTRICLE RV S prime:     10.50 cm/s TAPSE (M-mode): 1.2 cm LEFT ATRIUM             Index        RIGHT ATRIUM           Index LA diam:        5.65 cm 3.08 cm/m   RA Area:     29.90 cm LA Vol (A2C):   66.6 ml 36.32 ml/m  RA Volume:   102.00 ml 55.62 ml/m LA Vol (A4C):   91.6 ml 49.95 ml/m LA Biplane Vol: 82.0 ml 44.71 ml/m  AORTIC VALVE LVOT Vmax:   61.50 cm/s LVOT Vmean:  33.600 cm/s LVOT VTI:    0.112 m  AORTA Ao Root diam: 3.30 cm Ao Asc diam:  3.30 cm MITRAL VALVE                  TRICUSPID VALVE MV Area (PHT): 5.52 cm       TV Peak grad:   49.6 mmHg MV Area VTI:   1.26 cm       TV Vmax:        3.52 m/s MV Peak grad:  3.5 mmHg       TR Peak grad:   49.3 mmHg MV Mean grad:  1.0 mmHg       TR Vmax:        351.00 cm/s MV Vmax:       0.94 m/s MV Vmean:      47.2 cm/s      SHUNTS MV Decel Time: 138 msec       Systemic VTI:  0.11 m MR Peak grad:    83.2 mmHg    Systemic Diam: 2.10 cm MR Mean grad:    54.0 mmHg MR Vmax:         456.00 cm/s MR Vmean:        348.0 cm/s MR PISA:         0.57 cm MR PISA Eff ROA: 3 mm MR PISA Radius:  0.30 cm MV E velocity: 86.50 cm/s Skeet Latch MD Electronically signed by Skeet Latch MD Signature Date/Time: 08/10/2022/9:58:25 AM    Final    DG Chest 2 View  Result Date: 08/09/2022 CLINICAL DATA:  cp chest pain; pain by pacemaker EXAM: CHEST - 2 VIEW COMPARISON:  August 03, 2022; January 25, 2017 FINDINGS: The cardiomediastinal silhouette is unchanged in contour.Status post median sternotomy. Dual lead RIGHT chest cardiac pacing device. No pleural effusion. No pneumothorax. No acute pleuroparenchymal abnormality. Unchanged chronic interstitial markings. Partial visualization of endovascular repair of the abdominal aorta. Multilevel degenerative changes of the thoracic spine. IMPRESSION: No acute cardiopulmonary abnormality. Electronically Signed   By: Valentino Saxon M.D.   On: 08/09/2022 12:29    Cardiac  Studies   Echo: 08/10/2022  IMPRESSIONS     1. Left ventricular ejection fraction, by estimation, is 50 to 55%. Left  ventricular ejection fraction by 2D MOD biplane is 52.5 %. The left  ventricle has low normal function. The left ventricle demonstrates global  hypokinesis. Left ventricular  diastolic parameters are indeterminate.   2. Right ventricular systolic function is low normal. The right  ventricular size is normal. There is severely elevated pulmonary artery  systolic pressure.   3. Left atrial size was severely dilated.   4.  Right atrial size was moderately dilated.   5. The mitral valve is degenerative. Mild mitral valve regurgitation. No  evidence of mitral stenosis.   6. Tricuspid valve regurgitation is moderate.   7. The aortic valve is tricuspid. There is mild calcification of the  aortic valve. There is mild thickening of the aortic valve. Aortic valve  regurgitation is trivial. Aortic valve sclerosis/calcification is present,  without any evidence of aortic  stenosis.   8. The inferior vena cava is dilated in size with <50% respiratory  variability, suggesting right atrial pressure of 15 mmHg.   FINDINGS   Left Ventricle: Left ventricular ejection fraction, by estimation, is 50  to 55%. Left ventricular ejection fraction by 2D MOD biplane is 52.5 %.  The left ventricle has low normal function. The left ventricle  demonstrates global hypokinesis. The left  ventricular internal cavity size was normal in size. There is no left  ventricular hypertrophy. Left ventricular diastolic parameters are  indeterminate.   Right Ventricle: The right ventricular size is normal. No increase in  right ventricular wall thickness. Right ventricular systolic function is  low normal. There is severely elevated pulmonary artery systolic pressure.  The tricuspid regurgitant velocity  is 3.51 m/s, and with an assumed right atrial pressure of 15 mmHg, the  estimated right ventricular  systolic pressure is 13.2 mmHg.   Left Atrium: Left atrial size was severely dilated.   Right Atrium: Right atrial size was moderately dilated.   Pericardium: There is no evidence of pericardial effusion.   Mitral Valve: The mitral valve is degenerative in appearance. There is  mild thickening of the mitral valve leaflet(s). Mild mitral annular  calcification. Mild mitral valve regurgitation. No evidence of mitral  valve stenosis. MV peak gradient, 3.5 mmHg.  The mean mitral valve gradient is 1.0 mmHg.   Tricuspid Valve: The tricuspid valve is grossly normal. Tricuspid valve  regurgitation is moderate . No evidence of tricuspid stenosis.   Aortic Valve: The aortic valve is tricuspid. There is mild calcification  of the aortic valve. There is mild thickening of the aortic valve. There  is mild aortic valve annular calcification. Aortic valve regurgitation is  trivial. Aortic valve  sclerosis/calcification is present, without any evidence of aortic  stenosis.   Pulmonic Valve: The pulmonic valve was grossly normal. Pulmonic valve  regurgitation is mild. No evidence of pulmonic stenosis.   Aorta: The aortic root and ascending aorta are structurally normal, with  no evidence of dilitation.   Venous: The inferior vena cava is dilated in size with less than 50%  respiratory variability, suggesting right atrial pressure of 15 mmHg.   IAS/Shunts: No atrial level shunt detected by color flow Doppler.   Additional Comments: A device lead is visualized.    Patient Profile     88 y.o. male with CAD with CAGB  in late 1980s, ICM, tachy brady with PPM, AAA, chronic HFrEF, chronic afib who was seen 08/09/2022 for the evaluation of chest pain.   Assessment & Plan    NSTEMI -- hsTn peaked at 6499, being managed medically per patient/family wishes. Has been on IV heparin for 48hrs, will transition back to Eliquis '5mg'$  BID this morning. Echo showed LVEF of 50-55%, global hypokinesis,  severely elevated PA pressure, severely dilated LA, moderately dilated RA., moderate TR -- has been ambulating in the hallway without recurrent chest pain, though O2 sats low on RA -- continue ASA, statin, no BB with bradycardia. No plavix with need for [facility]  HFpEF -- hx of reduced LVEF of 35% from 2017, echo this admission actually improved LV function of 50-55% -- s/p IV lasix, net - 560cc, 1.3L UOP yesterday -- continue farxiga  -- plan to resume home torsemide at DC  Permanent Afib Tachy-brady s/p PPM -- Eliquis resumed this morning -- remains in afib with controlled rates, intermittent pacing  Dispo: will need home O2 at discharge, likely DC this afternoon  For questions or updates, please contact [place] Please consult [email or web address] for contact info under        Signed, Reino Bellis, NP  08/11/2022, 8:20 AM    I have personally seen and examined this patient. I agree with the assessment and plan as outlined above.  Doing well this am. Plans for medical management of NSTEMI. Restart Eliquis. Resume Torsemide. Supplemental O2 will be arranged.  D/c home today  Lauree Chandler, MD, [facility] 08/11/2022 9:15 AM

## 2022-08-11 NOTE — TOC Progression Note (Signed)
Transition of Care [facility]) - Progression Note    Patient Details  Name: Adam [name] [name]: [id number] Date of Birth: [birth year]  Transition of [facility] CM/SW Contact  Bartholomew Crews, RN Phone Number: [phone] 08/11/2022, 10:22 AM  Clinical Narrative:     Patient with discharge order. Chart reviewed. No TOC needs identified.        Expected Discharge Plan and Services         Expected Discharge Date: 08/11/22                                     Social Determinants of Health ([place]) Interventions [place]: No Food Insecurity (08/10/2022)  Housing: Low Risk  (08/10/2022)  Transportation Needs: No Transportation Needs (08/10/2022)  Utilities: Not At Risk (08/10/2022)  Tobacco Use: Medium Risk (08/09/2022)    Readmission Risk Interventions     No data to display

## 2022-08-11 NOTE — Progress Notes (Signed)
Mobility Specialist - Progress Note   08/11/22 1034  Mobility  Activity Ambulated with assistance in hallway  Level of Assistance Standby assist, set-up cues, supervision of patient - no hands on  Assistive Device Front wheel walker  Distance Ambulated (ft) 180 ft  Activity Response Tolerated well  Mobility Referral Yes  $Mobility charge 1 Mobility   Pt was received in bed and agreeable to mobility. No complaints during session. Pt was returned to bed with all needs met.   Franki Monte  Mobility Specialist Please contact via Solicitor or Rehab office at [phone]

## 2022-08-11 NOTE — Plan of Care (Signed)
  Problem: Health Behavior/Discharge Planning: Goal: Ability to safely manage health-related needs after discharge will improve Outcome: Progressing   Problem: Education: Goal: Knowledge of General Education information will improve Description: Including pain rating scale, medication(s)/side effects and non-pharmacologic comfort measures Outcome: Progressing   Problem: Clinical Measurements: Goal: Respiratory complications will improve Outcome: Progressing   Problem: Clinical Measurements: Goal: Cardiovascular complication will be avoided Outcome: Progressing   Problem: Activity: Goal: Risk for activity intolerance will decrease Outcome: Progressing   Problem: Nutrition: Goal: Adequate nutrition will be maintained Outcome: Progressing   Problem: Coping: Goal: Level of anxiety will decrease Outcome: Progressing   Problem: Pain Managment: Goal: General experience of comfort will improve Outcome: Progressing   Problem: Safety: Goal: Ability to remain free from injury will improve Outcome: Progressing   Problem: Skin Integrity: Goal: Risk for impaired skin integrity will decrease Outcome: Progressing

## 2022-08-12 LAB — LIPOPROTEIN A (LPA): Lipoprotein (a): 15.5 nmol/L (ref <75.0)

## 2022-08-13 ENCOUNTER — Ambulatory Visit: Payer: Medicare Other | Admitting: Podiatry

## 2022-08-17 NOTE — Progress Notes (Unsigned)
Cardiology Office Note:    Date:  08/20/2022   ID:  Adam [name], DOB Dec 08, 1924, [name] [id number]  PCP:  [name], MD   [facility] Providers Cardiologist:  Sinclair Grooms, MD     Referring MD: [name], MD   Chief Complaint: hospital follow-up s/p MI  History of Present Illness:    Adam [name] is a very pleasant 89 y.o. male with a hx of CAD s/p CABG x 6 (bypass graft failure saphenous vein to the obtuse marginal) in the late 80s, ICM, tachybradycardia syndrome s/p PPM 2008 (Medtronic), AAA, chronic HFrEF, chronic AF, HTN, HLD and multifactorial shortness of breath.  Initially seen by Dr. [name] in 2012 when he was cardioverted for recurrent atrial fibrillation.  2D echo completed in 2017 with a EF of 35%.  Seen by Ambrose Pancoast, NP on 05/13/2022 for shortness of breath.  He had a fall 2 months prior without head injury and had not been as active recently.  He has diuretic was changed to torsemide 40 mg by Dr. Caryl Comes May 2023.  He was encouraged at [place] 9/27 to take extra half tablet of Demadex if weight increased 2 pounds in 24 hours or 5 pounds in 1 week. No further testing was ordered.   Seen by Dr. [name] on 08/03/22 at which time he was feeling a little tired and weak but otherwise stable.  No changes were made to medical therapy and labs collected on that day were reported as stable by Dr. [name].  Admission 12/24-12/26/23. He presented to [facility] with 3 weeks of central chest pressure with associated SOB. Hs troponin peaked at 6499, being managed medically per patient/family wishes. Patient confirmed DNR status.  Was treated with IV heparin for 48 hours and transitioned back to Eliquis 5 mg twice daily. TTE 12/25 revealed LVEF 2 to 55%, global hypokinesis, severely elevated PA pressure, severely dilated LA, moderately dilated RA, moderate TR.  He was not placed on Plavix due to need for [place]. No BB with bradycardia.  Today, he is here with his wife, daughter, and son  for follow-up. Overall, feeling pretty good since discharge. Is weaker than prior to admission but is not having any discomfort. He is able to ambulate with a rolling walker. Has oxygen tank and is using oxygen around the clock, which is new for him. Was able to take a 25 minute shower the day prior to this visit without oxygen and had no respiratory distress or angina. Wife is concerned that he is not able to move around freely and will not be able to get any exercise. Leg swelling has improved significantly since he has been elevating legs and wearing compression. [employer] is managing oxygen equipment, they would like to get a portable tank.  He denies chest pain, dyspnea, orthopnea, PND, palpitations, presyncope, syncope.  Past Medical History:  Diagnosis Date   AAA (abdominal aortic aneurysm) (HCC)    Anginal pain (HCC)    Arthritis    CAD (coronary artery disease)    [name]   CHF (congestive heart failure) (HCC)    Chronic anticoagulation    Diverticulosis    GERD (gastroesophageal reflux disease)    Hyperlipidemia    Hypertension    LBP (low back pain)    Permanent atrial fibrillation ([place])    decision made at [facility] 2015   Presence of permanent cardiac pacemaker    S/P CABG (coronary artery bypass graft)  Shortness of breath dyspnea    Sleep apnea     Past Surgical History:  Procedure Laterality Date   CARDIOVERSION  06/30/2012   Procedure: CARDIOVERSION;  Surgeon: Sinclair Grooms, MD;  Location: [place];  Service: Cardiovascular;  Laterality: N/A;   CARDIOVERSION N/A 11/23/2013   Procedure: CARDIOVERSION;  Surgeon: Sinclair Grooms, MD;  Location: [facility] ENDOSCOPY;  Service: Cardiovascular;  Laterality: N/A;   CATARACT EXTRACTION W/ INTRAOCULAR LENS  IMPLANT, BILATERAL     CORONARY ARTERY BYPASS GRAFT  Feb 1989   "CABG X6"   ENDOVASCULAR STENT INSERTION  07/28/2011   Procedure: ENDOVASCULAR STENT GRAFT INSERTION;  Surgeon: Hinda Lenis, MD;  Location:  [place];  Service: Vascular;  Laterality: N/A;  Pt. on Coumadin/ instructed to hold 5 days prior to procedure   [place] Right 01/04/2015   INGUINAL HERNIA REPAIR Right 01/04/2015   Procedure:  [place];  Surgeon: Fanny Skates, MD;  Location: [place];  Service: General;  Laterality: Right;   INSERT / REPLACE / REMOVE PACEMAKER  July 2008   INSERTION OF MESH Right 01/04/2015   Procedure: INSERTION OF MESH;  Surgeon: Fanny Skates, MD;  Location: [place];  Service: General;  Laterality: Right;   [place]   Bilateral total knee replacements   LEFT HEART CATHETERIZATION WITH CORONARY ANGIOGRAM N/A 03/28/2014   Procedure: LEFT HEART CATHETERIZATION WITH CORONARY ANGIOGRAM;  Surgeon: Sinclair Grooms, MD;  Location: [facility] CATH LAB;  Service: Cardiovascular;  Laterality: N/A;   PERMANENT PACEMAKER GENERATOR CHANGE N/A 11/03/2013   Procedure: PERMANENT PACEMAKER GENERATOR CHANGE;  Surgeon: [name], MD;  Location: [facility] CATH LAB;  Service: Cardiovascular;  Laterality: N/A;   TONSILLECTOMY      Current Medications: Current Meds  Medication Sig   acetaminophen (TYLENOL) 650 MG CR tablet Take 1,300 mg by mouth as needed for pain.   allopurinol (ZYLOPRIM) 300 MG tablet Take 300 mg by mouth daily as needed (gout).   apixaban (ELIQUIS) 5 MG TABS tablet Take 1 tablet (5 mg total) by mouth 2 (two) times daily.   aspirin EC 81 MG tablet Take 1 tablet (81 mg total) by mouth daily. Swallow whole.   atorvastatin (LIPITOR) 80 MG tablet Take 1 tablet (80 mg total) by mouth daily.   dapagliflozin propanediol (FARXIGA) 10 MG TABS tablet Take 1 tablet (10 mg total) by mouth daily before breakfast.   dorzolamide (TRUSOPT) 2 % ophthalmic solution Place 1 drop into the right eye 2 (two) times daily.   finasteride (PROSCAR) 5 MG tablet Take 5 mg by mouth daily.   latanoprost (XALATAN) 0.005 % ophthalmic solution Place 1 drop into both eyes at  bedtime.   Multiple Vitamins-Minerals (PRESERVISION AREDS PO) Take 1 tablet by mouth in the morning and at bedtime.   nitroGLYCERIN (NITROSTAT) 0.4 MG SL tablet PLACE 1 TABLET UNDER THE TONGUE EVERY 5 MINUTES AS NEEDED FOR CHEST PAIN (Patient taking differently: Place 0.4 mg under the tongue every 5 (five) minutes as needed for chest pain.)   pregabalin (LYRICA) 75 MG capsule Take 75 mg by mouth every 12 (twelve) hours.   silodosin (RAPAFLO) 8 MG CAPS capsule Take 8 mg by mouth at bedtime.    torsemide (DEMADEX) 20 MG tablet Take 2 tablets (40 mg total) by mouth daily. (Patient taking differently: Take 20 mg by mouth 2 (two) times daily.)   VITAMIN D PO  Take 1 capsule by mouth daily.     Allergies:   Amlodipine, Codeine, Lisinopril, Morphine, and Amiodarone   Social History   Socioeconomic History   Marital status: Married    Spouse name: Not on file   Number of children: Not on file   Years of education: Not on file   Highest education level: Not on file  Occupational History   Not on file  Tobacco Use   Smoking status: Former    Years: 3.00    Types: Cigarettes    Quit date: 08/17/1948    Years since quitting: 74.0   Smokeless tobacco: Never  Vaping Use   Vaping Use: Never used  Substance and Sexual Activity   Alcohol use: No   Drug use: No   Sexual activity: Not on file  Other Topics Concern   Not on file  Social History Narrative   Not on file   Social Determinants of Health   Financial Resource Strain: Not on file  Food Insecurity: No Food Insecurity (08/10/2022)   Hunger Vital Sign    Worried About Running Out of Food in the Last Year: Never true    Ran Out of Food in the Last Year: Never true  Transportation Needs: No Transportation Needs (08/10/2022)   PRAPARE - Hydrologist (Medical): No    Lack of Transportation (Non-Medical): No  Physical Activity: Not on file  Stress: Not on file  Social Connections: Not on file     Family  History: The patient's family history includes Cancer in his sister; Heart attack in his father; Heart disease in his father and sister; Heart failure in his mother; Other in his sister; Stroke in his mother; Varicose Veins in his mother.  ROS:   Please see the history of present illness.    + weakness  All other systems reviewed and are negative.  Labs/Other Studies Reviewed:    The following studies were reviewed today:  Echo 08/10/22 1. Left ventricular ejection fraction, by estimation, is 50 to 55%. Left  ventricular ejection fraction by 2D MOD biplane is 52.5 %. The left  ventricle has low normal function. The left ventricle demonstrates global  hypokinesis. Left ventricular  diastolic parameters are indeterminate.   2. Right ventricular systolic function is low normal. The right  ventricular size is normal. There is severely elevated pulmonary artery  systolic pressure.   3. Left atrial size was severely dilated.   4. Right atrial size was moderately dilated.   5. The mitral valve is degenerative. Mild mitral valve regurgitation. No  evidence of mitral stenosis.   6. Tricuspid valve regurgitation is moderate.   7. The aortic valve is tricuspid. There is mild calcification of the  aortic valve. There is mild thickening of the aortic valve. Aortic valve  regurgitation is trivial. Aortic valve sclerosis/calcification is present,  without any evidence of aortic  stenosis.   8. The inferior vena cava is dilated in size with <50% respiratory  variability, suggesting right atrial pressure of 15 mmHg.  VAS [place] EVAR Duplex 11/25/21 Abdominal Aorta: Patent endovascular aneurysm repair with no evidence of  endoleak.    *See table(s) above for measurements and observations.    Recent Labs: 08/03/2022: NT-Pro BNP 3,399 08/09/2022: B Natriuretic Peptide 1,292.9 08/11/2022: BUN 28; Creatinine, Ser 1.08; Hemoglobin 11.9; Platelets 139; Potassium 4.0; Sodium 137  Recent Lipid Panel     Component Value Date/Time   CHOL 104 08/10/2022 0201   CHOL  138 01/03/2018 1146   TRIG 73 08/10/2022 0201   HDL 32 (L) 08/10/2022 0201   HDL 53 01/03/2018 1146   CHOLHDL 3.3 08/10/2022 0201   VLDL 15 08/10/2022 0201   LDLCALC NOT CALCULATED 08/10/2022 0201   LDLCALC 68 01/03/2018 1146     Risk Assessment/Calculations:    CHA2DS2-VASc Score = 5  {This indicates a 7.2% annual risk of stroke. The patient's score is based upon: CHF History: 1 HTN History: 1 Diabetes History: 0 Stroke History: 0 Vascular Disease History: 1 Age Score: 2 Gender Score: 0    Physical Exam:    VS:  BP 116/60   Pulse 83   Ht '5\' 8"'$  (1.727 m)   Wt 154 lb 9.6 oz (70.1 kg)   SpO2 100%   BMI 23.51 kg/m     Wt Readings from Last 3 Encounters:  08/20/22 154 lb 9.6 oz (70.1 kg)  08/09/22 155 lb (70.3 kg)  08/03/22 156 lb 6.4 oz (70.9 kg)     GEN: Frail, chronically ill appearing in no acute distress HEENT: Normal NECK: No JVD; No carotid bruits CARDIAC: Irregular RR, no murmurs, rubs, gallops RESPIRATORY:  Clear to auscultation without rales, wheezing or rhonchi  ABDOMEN: Soft, non-tender, non-distended MUSCULOSKELETAL:  Bilateral LE nonpitting edema; Diffuse bruising. 2+ pedal pulses, equal bilaterally SKIN: Warm and dry. Diffuse bruising without bleeding NEUROLOGIC:  Alert and oriented x 3 PSYCHIATRIC:  Normal affect   EKG:  EKG is not ordered today.    Diagnoses:    1. NSTEMI (non-ST elevated myocardial infarction) ([place])   2. Coronary artery disease involving native coronary artery of native heart without angina pectoris   3. Ischemic cardiomyopathy   4. Long term current use of anticoagulant therapy   5. Hypoxia   6. Sinus brady-tachy syndrome ([place])   7. PACEMAKER, PERMANENT-[employer]   8. Chronic systolic heart failure (HCC)   9. Chronic atrial fibrillation (HCC)    Assessment and Plan:     NSTEMI: Admission 08/09/2022 with NSTEMI, hs trop peaked at 6499, medical management  per patient and family wishes. He is doing well today. Denies chest pain, significant dyspnea although he has not been as active since hospital d/c. Minor nosebleeds since starting O2 and skin bruising/mild bleeding. Continue medical management with aspirin, statin. No BB due to bradycardia. No Plavix due to [facility]. Notify us if bleeding worsens.   Hypoxia: Now on chronic O2 4 low O2 sats with ambulation during hospitalization. O2 sats 100% on O2. Has been off oxygen for showers with no acute distress. Family requested a portable O2 tank. I called [employer] and spoke with the clinical liaison who will reach out to patient and family. Encouraged patient and family to continue to monitor activity tolerance and O2 response for activities when not wearing oxygen.   Chronic atrial fibrillation on chronic anticoagulation: Clinically appears to be in atrial fibrillation with well-controlled ventricular rate.  Has had some minor bleeding issues since hospitalization. Encouraged family to notify us with significant bleeding concerns. No palpitations, tachycardia. Continue Eliquis 5 mg twice daily which is appropriate dose for patient age/weight/creatinine.   Tachy-bradycardia syndrome s/p PPM implant: Continue remote device monitoring. Will schedule follow-up with Dr. Caryl Comes due in May.   Hyperlipidemia LDL goal < 70: Normal lipoprotein a, unable to calculate LDL during hospitalization   Chronic HFrEF: History of reduced LVEF 35% from 2017 with improvement of LVEF to 50 to 55% 08/10/2022.  Some evidence of volume overload during admission. Appears euvolemic on  exam today. Mild LE edema, no dyspnea, orthopnea, PND. Continue Farxiga, torsemide.      Disposition:  1 month with APP and 3 months with Dr. Marlou Porch (transition from Dr. [name])  Medication Adjustments/Labs and Tests Ordered: Current medicines are reviewed at length with the patient today.  Concerns regarding medicines are outlined above.  No orders of the  defined types were placed in this encounter.  No orders of the defined types were placed in this encounter.   Patient Instructions  Medication Instructions:  Your physician recommends that you continue on your current medications as directed. Please refer to the Current Medication list given to you today.  *If you need a refill on your cardiac medications before your next appointment, please call your pharmacy*   Lab Work: None ordered   If you have labs (blood work) drawn today and your tests are completely normal, you will receive your results only by: [place] (if you have MyChart) OR A paper copy in the mail If you have any lab test that is abnormal or we need to change your treatment, we will call you to review the results.   Testing/Procedures: None ordered    Follow-Up: Follow up as scheduled    Other Instructions Your physician recommends that you keep check on your blood pressure. Your blood pressure should not run above 140/80       Signed, Emmaline Life, NP  08/20/2022 [place]

## 2022-08-20 ENCOUNTER — Encounter: Payer: Self-pay | Admitting: Nurse Practitioner

## 2022-08-20 ENCOUNTER — Ambulatory Visit: Payer: Medicare Other | Attending: Nurse Practitioner | Admitting: Nurse Practitioner

## 2022-08-20 VITALS — BP 116/60 | HR 83 | Ht 68.0 in | Wt 154.6 lb

## 2022-08-20 DIAGNOSIS — Z7901 Long term (current) use of anticoagulants: Secondary | ICD-10-CM | POA: Diagnosis not present

## 2022-08-20 DIAGNOSIS — Z950 Presence of cardiac pacemaker: Secondary | ICD-10-CM | POA: Insufficient documentation

## 2022-08-20 DIAGNOSIS — I5022 Chronic systolic (congestive) heart failure: Secondary | ICD-10-CM | POA: Diagnosis not present

## 2022-08-20 DIAGNOSIS — R0902 Hypoxemia: Secondary | ICD-10-CM | POA: Insufficient documentation

## 2022-08-20 DIAGNOSIS — I255 Ischemic cardiomyopathy: Secondary | ICD-10-CM | POA: Diagnosis not present

## 2022-08-20 DIAGNOSIS — I495 Sick sinus syndrome: Secondary | ICD-10-CM | POA: Insufficient documentation

## 2022-08-20 DIAGNOSIS — I482 Chronic atrial fibrillation, unspecified: Secondary | ICD-10-CM | POA: Insufficient documentation

## 2022-08-20 DIAGNOSIS — I2510 Atherosclerotic heart disease of native coronary artery without angina pectoris: Secondary | ICD-10-CM | POA: Diagnosis not present

## 2022-08-20 DIAGNOSIS — I214 Non-ST elevation (NSTEMI) myocardial infarction: Secondary | ICD-10-CM | POA: Insufficient documentation

## 2022-08-20 NOTE — Patient Instructions (Addendum)
Medication Instructions:  Your physician recommends that you continue on your current medications as directed. Please refer to the Current Medication list given to you today.  *If you need a refill on your cardiac medications before your next appointment, please call your pharmacy*   Lab Work: None ordered   If you have labs (blood work) drawn today and your tests are completely normal, you will receive your results only by: [place] (if you have MyChart) OR A paper copy in the mail If you have any lab test that is abnormal or we need to change your treatment, we will call you to review the results.   Testing/Procedures: None ordered    Follow-Up: Follow up as scheduled    Other Instructions Your physician recommends that you keep check on your blood pressure. Your blood pressure should not run above 140/80

## 2022-08-24 DIAGNOSIS — I491 Atrial premature depolarization: Secondary | ICD-10-CM | POA: Diagnosis not present

## 2022-08-24 DIAGNOSIS — R42 Dizziness and giddiness: Secondary | ICD-10-CM | POA: Diagnosis not present

## 2022-08-24 DIAGNOSIS — R230 Cyanosis: Secondary | ICD-10-CM | POA: Diagnosis not present

## 2022-08-24 DIAGNOSIS — R239 Unspecified skin changes: Secondary | ICD-10-CM | POA: Diagnosis not present

## 2022-08-24 DIAGNOSIS — R0902 Hypoxemia: Secondary | ICD-10-CM | POA: Diagnosis not present

## 2022-08-26 ENCOUNTER — Telehealth: Payer: Self-pay | Admitting: Cardiology

## 2022-08-26 ENCOUNTER — Ambulatory Visit (INDEPENDENT_AMBULATORY_CARE_PROVIDER_SITE_OTHER): Payer: Medicare Other

## 2022-08-26 DIAGNOSIS — I255 Ischemic cardiomyopathy: Secondary | ICD-10-CM | POA: Diagnosis not present

## 2022-08-26 NOTE — Telephone Encounter (Signed)
Pt spouse called back to discuss question regarding home oxygen machine.    Left message for spouse to call me back.

## 2022-08-26 NOTE — Telephone Encounter (Signed)
On the day of Mr. [place] office visit, I spoke with Caryl Pina at [facility] who is handling his oxygen equipment. They were supposed to get in touch with the patient to deliver a portable machine. Please have the patient's wife to contact [place]. She can tell them that I spoke with Caryl Pina.  Thank you, Sharyn Lull

## 2022-08-26 NOTE — Telephone Encounter (Signed)
Wife stated they have an oxygen machine and they are having trouble with the equipment.  Wife would like to get a portable oxygen machine.

## 2022-08-26 NOTE — Telephone Encounter (Signed)
I spoke with the pts wife... and she verbalized understanding of calling [facility] and talking with Caryl Pina. She will let us know if she has any difficulties.

## 2022-08-26 NOTE — Telephone Encounter (Signed)
Pt called back per request for portable home oxygen machine.    Ms. Lichtenwalner stated her husband is 89 y/o, and she is concerned about the extra tubing around their home  / on the floor, and the fall hazard it has become.     Ms. Coppolino wants to know if portable oxygen machine / option is available, and if the providers would be willing to order this?  I told Ms. Mandato that I would forward her concern / question to the providers.  Mr. Weissberg is a former Dr. [name] patient.

## 2022-08-27 LAB — CUP PACEART REMOTE DEVICE CHECK
Battery Impedance: 2450 Ohm
Battery Remaining Longevity: 30 mo
Battery Voltage: 2.74 V
Brady Statistic RV Percent Paced: 23 %
Date Time Interrogation Session: 20240110180031
Implantable Lead Connection Status: 753985
Implantable Lead Connection Status: 753985
Implantable Lead Implant Date: 20080818
Implantable Lead Implant Date: 20080818
Implantable Lead Location: 753859
Implantable Lead Location: 753860
Implantable Lead Model: 4469
Implantable Lead Model: 4470
Implantable Lead Serial Number: 499409
Implantable Lead Serial Number: 602985
Implantable Pulse Generator Implant Date: 20150320
Lead Channel Impedance Value: 425 Ohm
Lead Channel Impedance Value: 67 Ohm
Lead Channel Pacing Threshold Amplitude: 1.25 V
Lead Channel Pacing Threshold Pulse Width: 0.4 ms
Lead Channel Setting Pacing Amplitude: 2.5 V
Lead Channel Setting Pacing Pulse Width: 0.4 ms
Lead Channel Setting Sensing Sensitivity: 5.6 mV
Zone Setting Status: 755011
Zone Setting Status: 755011

## 2022-08-30 ENCOUNTER — Emergency Department (HOSPITAL_COMMUNITY): Payer: Medicare Other

## 2022-08-30 ENCOUNTER — Emergency Department (HOSPITAL_COMMUNITY)
Admission: EM | Admit: 2022-08-30 | Discharge: 2022-08-30 | Disposition: A | Discharge status: Home / Self Care | Payer: Medicare Other | Attending: Emergency Medicine | Admitting: Emergency Medicine

## 2022-08-30 ENCOUNTER — Other Ambulatory Visit: Payer: Self-pay

## 2022-08-30 DIAGNOSIS — R0600 Dyspnea, unspecified: Secondary | ICD-10-CM | POA: Diagnosis not present

## 2022-08-30 DIAGNOSIS — I110 Hypertensive heart disease with heart failure: Secondary | ICD-10-CM | POA: Insufficient documentation

## 2022-08-30 DIAGNOSIS — R531 Weakness: Secondary | ICD-10-CM | POA: Diagnosis not present

## 2022-08-30 DIAGNOSIS — I959 Hypotension, unspecified: Secondary | ICD-10-CM | POA: Diagnosis not present

## 2022-08-30 DIAGNOSIS — I4891 Unspecified atrial fibrillation: Secondary | ICD-10-CM | POA: Insufficient documentation

## 2022-08-30 DIAGNOSIS — Z87891 Personal history of nicotine dependence: Secondary | ICD-10-CM | POA: Insufficient documentation

## 2022-08-30 DIAGNOSIS — I482 Chronic atrial fibrillation, unspecified: Secondary | ICD-10-CM

## 2022-08-30 DIAGNOSIS — Z8679 Personal history of other diseases of the circulatory system: Secondary | ICD-10-CM | POA: Diagnosis not present

## 2022-08-30 DIAGNOSIS — Z951 Presence of aortocoronary bypass graft: Secondary | ICD-10-CM | POA: Diagnosis not present

## 2022-08-30 DIAGNOSIS — I509 Heart failure, unspecified: Secondary | ICD-10-CM | POA: Diagnosis not present

## 2022-08-30 DIAGNOSIS — M6281 Muscle weakness (generalized): Secondary | ICD-10-CM | POA: Insufficient documentation

## 2022-08-30 DIAGNOSIS — R609 Edema, unspecified: Secondary | ICD-10-CM | POA: Diagnosis not present

## 2022-08-30 DIAGNOSIS — Z950 Presence of cardiac pacemaker: Secondary | ICD-10-CM | POA: Insufficient documentation

## 2022-08-30 DIAGNOSIS — Z7901 Long term (current) use of anticoagulants: Secondary | ICD-10-CM | POA: Insufficient documentation

## 2022-08-30 DIAGNOSIS — R0789 Other chest pain: Secondary | ICD-10-CM | POA: Diagnosis not present

## 2022-08-30 DIAGNOSIS — R079 Chest pain, unspecified: Secondary | ICD-10-CM | POA: Diagnosis not present

## 2022-08-30 DIAGNOSIS — Z96653 Presence of artificial knee joint, bilateral: Secondary | ICD-10-CM | POA: Insufficient documentation

## 2022-08-30 DIAGNOSIS — Z743 Need for continuous supervision: Secondary | ICD-10-CM | POA: Diagnosis not present

## 2022-08-30 DIAGNOSIS — Z7982 Long term (current) use of aspirin: Secondary | ICD-10-CM | POA: Insufficient documentation

## 2022-08-30 DIAGNOSIS — R0602 Shortness of breath: Secondary | ICD-10-CM | POA: Diagnosis not present

## 2022-08-30 DIAGNOSIS — F32A Depression, unspecified: Secondary | ICD-10-CM | POA: Diagnosis not present

## 2022-08-30 LAB — COMPREHENSIVE METABOLIC PANEL
ALT: 21 U/L (ref 0–44)
AST: 45 U/L — ABNORMAL HIGH (ref 15–41)
Albumin: 3.4 g/dL — ABNORMAL LOW (ref 3.5–5.0)
Alkaline Phosphatase: 97 U/L (ref 38–126)
Anion gap: 10 (ref 5–15)
BUN: 31 mg/dL — ABNORMAL HIGH (ref 8–23)
CO2: 30 mmol/L (ref 22–32)
Calcium: 8.5 mg/dL — ABNORMAL LOW (ref 8.9–10.3)
Chloride: 99 mmol/L (ref 98–111)
Creatinine, Ser: 1.39 mg/dL — ABNORMAL HIGH (ref 0.61–1.24)
GFR, Estimated: 47 mL/min — ABNORMAL LOW (ref >60)
Glucose, Bld: 125 mg/dL — ABNORMAL HIGH (ref 70–99)
Potassium: 3.7 mmol/L (ref 3.5–5.1)
Sodium: 139 mmol/L (ref 135–145)
Total Bilirubin: 1.2 mg/dL (ref 0.3–1.2)
Total Protein: 6.6 g/dL (ref 6.5–8.1)

## 2022-08-30 LAB — CBC WITH DIFFERENTIAL/PLATELET
Abs Immature Granulocytes: 0.22 10*3/uL — ABNORMAL HIGH (ref 0.00–0.07)
Basophils Absolute: 0.1 10*3/uL (ref 0.0–0.1)
Basophils Relative: 1 %
Eosinophils Absolute: 0.1 10*3/uL (ref 0.0–0.5)
Eosinophils Relative: 1 %
HCT: 33.2 % — ABNORMAL LOW (ref 39.0–52.0)
Hemoglobin: 10.9 g/dL — ABNORMAL LOW (ref 13.0–17.0)
Immature Granulocytes: 2 %
Lymphocytes Relative: 14 %
Lymphs Abs: 1.7 10*3/uL (ref 0.7–4.0)
MCH: 31.9 pg (ref 26.0–34.0)
MCHC: 32.8 g/dL (ref 30.0–36.0)
MCV: 97.1 fL (ref 80.0–100.0)
Monocytes Absolute: 1.2 10*3/uL — ABNORMAL HIGH (ref 0.1–1.0)
Monocytes Relative: 10 %
Neutro Abs: 8.3 10*3/uL — ABNORMAL HIGH (ref 1.7–7.7)
Neutrophils Relative %: 72 %
Platelets: 129 10*3/uL — ABNORMAL LOW (ref 150–400)
RBC: 3.42 MIL/uL — ABNORMAL LOW (ref 4.22–5.81)
RDW: 16.2 % — ABNORMAL HIGH (ref 11.5–15.5)
WBC: 11.6 10*3/uL — ABNORMAL HIGH (ref 4.0–10.5)
nRBC: 0 % (ref 0.0–0.2)

## 2022-08-30 LAB — TROPONIN I (HIGH SENSITIVITY)
Troponin I (High Sensitivity): 49 ng/L — ABNORMAL HIGH (ref <18)
Troponin I (High Sensitivity): 45 ng/L — ABNORMAL HIGH (ref <18)

## 2022-08-30 LAB — BRAIN NATRIURETIC PEPTIDE: B Natriuretic Peptide: 676.8 pg/mL — ABNORMAL HIGH (ref 0.0–100.0)

## 2022-08-30 NOTE — ED Provider Notes (Signed)
[facility] EMERGENCY DEPARTMENT Provider Note   CSN: [id number] Arrival date & time: 08/30/22  0242     History  Chief Complaint  Patient presents with   Chest Pain    Adam [name] is a 89 y.o. male.  He is a fairly poor historian and wife is giving a lot of the history.  He was just discharged from the hospital after an NSTEMI.  She said since he has been home he has been very fatigued not eating or drinking well.  He has had on and off shortness of breath.  He felt worse last night and asked her to call EMS.  He was experiencing some chest discomfort at that time.  She also said he has been complaining of some abdominal pain intermittently.  She feel he has been depressed.  Patient currently denies any chest pain or shortness of breath  The history is provided by the patient.  Shortness of Breath Severity:  Moderate Onset quality:  Gradual Timing:  Intermittent Progression:  Unchanged Relieved by:  Rest Worsened by:  Activity Ineffective treatments:  Oxygen Associated symptoms: abdominal pain and chest pain   Associated symptoms: no fever, no hemoptysis, no sputum production and no vomiting        Home Medications Prior to Admission medications   Medication Sig Start Date End Date Taking? Authorizing Provider  acetaminophen (TYLENOL) 650 MG CR tablet Take 1,300 mg by mouth as needed for pain.    [provider]  allopurinol (ZYLOPRIM) 300 MG tablet Take 300 mg by mouth daily as needed (gout). 07/28/21   [provider]  apixaban (ELIQUIS) 5 MG TABS tablet Take 1 tablet (5 mg total) by mouth 2 (two) times daily. 05/29/22   [name], MD  aspirin EC 81 MG tablet Take 1 tablet (81 mg total) by mouth daily. Swallow whole. 08/11/22   Cheryln Manly, NP  atorvastatin (LIPITOR) 80 MG tablet Take 1 tablet (80 mg total) by mouth daily. 08/11/22   Cheryln Manly, NP  dapagliflozin propanediol (FARXIGA) 10 MG TABS tablet Take 1  tablet (10 mg total) by mouth daily before breakfast. 12/09/21   [name], MD  dorzolamide (TRUSOPT) 2 % ophthalmic solution Place 1 drop into the right eye 2 (two) times daily. 03/13/20   [provider]  finasteride (PROSCAR) 5 MG tablet Take 5 mg by mouth daily. 06/26/19   [provider]  latanoprost (XALATAN) 0.005 % ophthalmic solution Place 1 drop into both eyes at bedtime.    [provider]  Multiple Vitamins-Minerals (PRESERVISION AREDS PO) Take 1 tablet by mouth in the morning and at bedtime.    [provider]  nitroGLYCERIN (NITROSTAT) 0.4 MG SL tablet PLACE 1 TABLET UNDER THE TONGUE EVERY 5 MINUTES AS NEEDED FOR CHEST PAIN Patient taking differently: Place 0.4 mg under the tongue every 5 (five) minutes as needed for chest pain. 06/27/20   [name], MD  pregabalin (LYRICA) 75 MG capsule Take 75 mg by mouth every 12 (twelve) hours. 12/18/20   [provider]  silodosin (RAPAFLO) 8 MG CAPS capsule Take 8 mg by mouth at bedtime.  04/17/14   [provider]  torsemide (DEMADEX) 20 MG tablet Take 2 tablets (40 mg total) by mouth daily. Patient taking differently: Take 20 mg by mouth 2 (two) times daily. 01/14/22   [name], MD  VITAMIN D PO Take 1 capsule by mouth daily.    [provider]      Allergies    Amlodipine, Codeine, Lisinopril, Morphine, and Amiodarone    Review of Systems   Review of Systems  Constitutional:  Positive for activity change, appetite change and fatigue. Negative for fever.  Respiratory:  Positive for shortness of breath. Negative for hemoptysis and sputum production.   Cardiovascular:  Positive for chest pain and leg swelling.  Gastrointestinal:  Positive for abdominal pain. Negative for vomiting.    Physical Exam Updated Vital Signs BP (!) 132/121   Pulse (!) 56   Temp (!) 97.5 F (36.4 C) (Oral)   Resp 19   Ht '5\' 8"'$  (1.727 m)   Wt 69.9 kg   SpO2 100%   BMI 23.42 kg/m   Physical Exam Vitals and nursing note reviewed.  Constitutional:      General: He is not in acute distress.    Appearance: He is well-developed.  HENT:     Head: Normocephalic and atraumatic.  Eyes:     Conjunctiva/sclera: Conjunctivae normal.  Cardiovascular:     Rate and Rhythm: Bradycardia present. Rhythm irregular.     Heart sounds: Normal heart sounds. No murmur heard. Pulmonary:     Effort: Pulmonary effort is normal. No respiratory distress.     Breath sounds: Normal breath sounds.  Abdominal:     Palpations: Abdomen is soft.     Tenderness: There is no abdominal tenderness.  Musculoskeletal:        General: No swelling.     Cervical back: Neck supple.     Right lower leg: No tenderness. Edema present.     Left lower leg: No tenderness. Edema present.  Skin:    General: Skin is warm and dry.     Capillary Refill: Capillary refill takes less than 2 seconds.  Neurological:     General: No focal deficit present.     Mental Status: He is alert.     ED Results / Procedures / Treatments   Labs (all labs ordered are listed, but only abnormal results are displayed) Labs Reviewed  CBC WITH DIFFERENTIAL/PLATELET - Abnormal; Notable for the following components:      Result Value   WBC 11.6 (*)    RBC 3.42 (*)    Hemoglobin 10.9 (*)    HCT 33.2 (*)    RDW 16.2 (*)    Platelets 129 (*)    Neutro Abs 8.3 (*)    Monocytes Absolute 1.2 (*)    Abs Immature Granulocytes 0.22 (*)    All other components within normal limits  BRAIN NATRIURETIC PEPTIDE - Abnormal; Notable for the following components:   B Natriuretic Peptide 676.8 (*)    All other components within normal limits  COMPREHENSIVE METABOLIC PANEL - Abnormal; Notable for the following components:   Glucose, Bld 125 (*)    BUN 31 (*)    Creatinine, Ser 1.39 (*)    Calcium 8.5 (*)    Albumin 3.4 (*)    AST 45 (*)    GFR, Estimated 47 (*)    All other components within normal limits  TROPONIN I (HIGH  SENSITIVITY) - Abnormal; Notable for the following components:   Troponin I (High Sensitivity) 49 (*)    All other components within normal limits  TROPONIN I (HIGH SENSITIVITY) - Abnormal; Notable for the following components:   Troponin I (High Sensitivity) 45 (*)    All other components within normal limits    EKG EKG Interpretation  Date/Time:  Sunday August 30 2022 08:35:25 EST Ventricular Rate:  43 PR Interval:    QRS Duration: 133 QT Interval:  513 QTC Calculation: 434 R Axis:   97 Text Interpretation: Atrial flutter Nonspecific intraventricular conduction delay Anteroseptal infarct, old Repol abnrm suggests ischemia, anterolateral Confirmed by Aletta Edouard [phone]) on 08/30/2022 8:58:29 AM  Radiology DG Chest 2 View  Result Date: 08/30/2022 CLINICAL DATA:  Chest pain EXAM: CHEST - 2 VIEW COMPARISON:  08/09/2022 FINDINGS: Cardiac shadow is stable. Postsurgical changes are seen. Pacing device is again noted and stable. Mild increased central vascular congestion is noted with interstitial edema new from the prior exam. No focal confluent infiltrate is seen. No bony abnormality is noted. IMPRESSION: Changes consistent with CHF and interstitial edema. Electronically Signed   By: Inez Catalina M.D.   On: 08/30/2022 03:16    Procedures Procedures    Medications Ordered in ED Medications - No data to display  ED Course/ Medical Decision Making/ A&P Clinical Course as of 08/30/22 1706  Sun Aug 30, 2022  0954 Discussed with Dr. Debara Pickett cardiology.  He reviewed patient's recent notes.  He had declined catheterization.  He does not feel he is a candidate for any other aggressive treatment at this time. [MB]  1005 Patient and wife would rather return home than be admitted.  She said she does not have services at home.  Patient wanted to reiterate that he does not want any life-saving measures.  He is requiring oxygen and has oxygen at home but they do not have a tank here.  Will see if  qualifies for ambulance transfer [MB]    Clinical Course User Index [MB] Hayden Rasmussen, MD                             Medical Decision Making  This patient complains of chest pain shortness of breath fatigue poor intake; this involves an extensive number of treatment Options and is a complaint that carries with it a high risk of complications and morbidity. The differential includes ACS, pneumonia, PE, CHF, infection, failure to thrive, metabolic derangement  I ordered, reviewed and interpreted labs, which included CBC with mildly elevated white count hemoglobin slightly lower than priors platelets slightly lower than priors chemistries with elevated BUN and creatinine, troponins elevated but flat, BNP mildly elevated but better than priors I ordered imaging studies which included chest x-ray and I independently    visualized and interpreted imaging which showed congestive heart failure Additional history obtained from patient's wife Previous records obtained and reviewed in epic including recent discharge summary I consulted cardiology Dr. Debara Pickett and discussed lab and imaging findings and discussed disposition.  Cardiac monitoring reviewed, frequent paced beats Social determinants considered, no significant barriers Critical Interventions: None  After the interventions stated above, I reevaluated the patient and found patient to be resting comfortably on his home oxygen Admission and further testing considered, I offered them admission and he felt he would be better off going home.  He does not want any aggressive intervention and cardiology is also decided that he is not a candidate for anything.  Wife is a little concerned as she did not bring his oxygen with him so will return back to home by ambulance.  Return instructions discussed.  I have also put in for home health referral to see if we can get him services at home.         Final Clinical Impression(s) / ED  Diagnoses Final diagnoses:  Nonspecific chest pain  SOB (shortness of breath)  Generalized weakness    Rx / DC Orders ED Discharge Orders     None         Hayden Rasmussen, MD 08/30/22 1709

## 2022-08-30 NOTE — ED Triage Notes (Signed)
Pt BIB EMS with c/o CP and SOB that started yesterday. Pt has hx of CABG.    '324mg'$  Aspirin Nitro x1

## 2022-08-30 NOTE — Discharge Instructions (Signed)
You were seen in the emergency department for chest pain shortness of breath general weakness.  You had lab work done chest x-ray EKG.  You were feeling better and wanted to go home.  Will be important you to followed up with your primary care doctor.  We have also put in a referral to get some home services started.  Please continue your regular medications.  Return to the emergency department if any worsening or concerning symptoms

## 2022-08-30 NOTE — Consult Note (Signed)
CARDIOLOGY CONSULT NOTE       Patient ID: Adam [name] [name]: [id number] DOB/AGE: 1924-12-15 89 y.o.  Admit date: 08/30/2022 Referring Physician: Ian [facility] Primary Physician: [name], MD Primary Cardiologist: [name] Reason for Consultation: Chest Pain / Dyspnea  Active Problems:   * No active hospital problems. *   HPI:  89 y.o. seen in ED at request of Dr Melina Copa for chest pain/dyspnea He was d/c on 08/11/22 after SEMI with peak troponin in 6499  range. Seen by Dr Burt Knack Not a candidate for cath lab given advanced age and patient/family wishes. Distant history of CABG with known occluded SVG to OM and patent SVG RCA, D1 and LIMA to LAD by cath 2015. TTE  EF 50-55% He is DNR and seems appropriate for hospice / palliative services.   History mostly through wife He follows commands but doesn't talk much She indicates biggest issue is fear of being SOB. Currently not complaining of chest pain ECG with chronic afib and escape pacing at rate 40 bpm. He is able to ambulate to bathroom but gets fearful of his breathing CXR NAD sats 98% on 2L's BNP only 676.  Note on DC summary that home oxygen to be arranged but wife indicates they do not have any   ROS All other systems reviewed and negative except as noted above  Past Medical History:  Diagnosis Date   AAA (abdominal aortic aneurysm) (HCC)    Anginal pain (HCC)    Arthritis    CAD (coronary artery disease)    [name]   CHF (congestive heart failure) (HCC)    Chronic anticoagulation    Diverticulosis    GERD (gastroesophageal reflux disease)    Hyperlipidemia    Hypertension    LBP (low back pain)    Permanent atrial fibrillation ([place])    decision made at [facility] 2015   Presence of permanent cardiac pacemaker    S/P CABG (coronary artery bypass graft)    Shortness of breath dyspnea    Sleep apnea     Family History  Problem Relation Age of Onset   Stroke Mother    Heart failure Mother    Varicose  Veins Mother    Heart attack Father    Heart disease Father        Before age 41    Heart Attack age 70 and 18   Cancer Sister        breast   Heart disease Sister        After age 24   Other Sister        DJD    Social History   Socioeconomic History   Marital status: Married    Spouse name: Not on file   Number of children: Not on file   Years of education: Not on file   Highest education level: Not on file  Occupational History   Not on file  Tobacco Use   Smoking status: Former    Years: 3.00    Types: Cigarettes    Quit date: 08/17/1948    Years since quitting: 74.0   Smokeless tobacco: Never  Vaping Use   Vaping Use: Never used  Substance and Sexual Activity   Alcohol use: No   Drug use: No   Sexual activity: Not on file  Other Topics Concern   Not on file  Social History Narrative   Not on file   Social Determinants of Radio broadcast assistant  Strain: Not on file  Food Insecurity: No Food Insecurity (08/10/2022)   Hunger Vital Sign    Worried About Running Out of Food in the Last Year: Never true    Ran Out of Food in the Last Year: Never true  Transportation Needs: No Transportation Needs (08/10/2022)   PRAPARE - Hydrologist (Medical): No    Lack of Transportation (Non-Medical): No  Physical Activity: Not on file  Stress: Not on file  Social Connections: Not on file  Intimate Partner Violence: Not At Risk (08/10/2022)   Humiliation, Afraid, Rape, and Kick questionnaire    Fear of Current or Ex-Partner: No    Emotionally Abused: No    Physically Abused: No    Sexually Abused: No    Past Surgical History:  Procedure Laterality Date   CARDIOVERSION  06/30/2012   Procedure: CARDIOVERSION;  Surgeon: Sinclair Grooms, MD;  Location: [place];  Service: Cardiovascular;  Laterality: N/A;   CARDIOVERSION N/A 11/23/2013   Procedure: CARDIOVERSION;  Surgeon: Sinclair Grooms, MD;  Location[facility] ENDOSCOPY;  Service:  Cardiovascular;  Laterality: N/A;   CATARACT EXTRACTION W/ INTRAOCULAR LENS  IMPLANT, BILATERAL     CORONARY ARTERY BYPASS GRAFT  Feb 1989   "CABG X6"   ENDOVASCULAR STENT INSERTION  07/28/2011   Procedure: ENDOVASCULAR STENT GRAFT INSERTION;  Surgeon: Hinda Lenis, MD;  Location: [place];  Service: Vascular;  Laterality: N/A;  Pt. on Coumadin/ instructed to hold 5 days prior to procedure   [place]ard Right 01/04/2015   INGUINAL HERNIA REPAIR Right 01/04/2015   Procedure:  [place];  Surgeon: Fanny Skates, MD;  Location: [place];  Service: General;  Laterality: Right;   INSERT / REPLACE / REMOVE PACEMAKER  July 2008   INSERTION OF MESH Right 01/04/2015   Procedure: INSERTION OF MESH;  Surgeon: Fanny Skates, MD;  Location: [place];  Service: General;  Laterality: Right;   [place]   Bilateral total knee replacements   LEFT HEART CATHETERIZATION WITH CORONARY ANGIOGRAM N/A 03/28/2014   Procedure: LEFT HEART CATHETERIZATION WITH CORONARY ANGIOGRAM;  Surgeon: Sinclair Grooms, MD;  Location: [facility] CATH LAB;  Service: Cardiovascular;  Laterality: N/A;   PERMANENT PACEMAKER GENERATOR CHANGE N/A 11/03/2013   Procedure: PERMANENT PACEMAKER GENERATOR CHANGE;  Surgeon: [name], MD;  Location: [facility] CATH LAB;  Service: Cardiovascular;  Laterality: N/A;   TONSILLECTOMY       No current facility-administered medications for this encounter.  Current Outpatient Medications:    acetaminophen (TYLENOL) 650 MG CR tablet, Take 1,300 mg by mouth as needed for pain., Disp: , Rfl:    allopurinol (ZYLOPRIM) 300 MG tablet, Take 300 mg by mouth daily as needed (gout)., Disp: , Rfl:    apixaban (ELIQUIS) 5 MG TABS tablet, Take 1 tablet (5 mg total) by mouth 2 (two) times daily., Disp: 180 tablet, Rfl: 1   aspirin EC 81 MG tablet, Take 1 tablet (81 mg total) by mouth daily. Swallow whole., Disp: 90 tablet, Rfl: 1   atorvastatin (LIPITOR)  80 MG tablet, Take 1 tablet (80 mg total) by mouth daily., Disp: 90 tablet, Rfl: 1   dapagliflozin propanediol (FARXIGA) 10 MG TABS tablet, Take 1 tablet (10 mg total) by mouth daily before breakfast., Disp: 90 tablet, Rfl: 3   dorzolamide (TRUSOPT) 2 % ophthalmic solution, Place 1 drop into the right eye  2 (two) times daily., Disp: , Rfl:    finasteride (PROSCAR) 5 MG tablet, Take 5 mg by mouth daily., Disp: , Rfl:    latanoprost (XALATAN) 0.005 % ophthalmic solution, Place 1 drop into both eyes at bedtime., Disp: , Rfl:    Multiple Vitamins-Minerals (PRESERVISION AREDS PO), Take 1 tablet by mouth in the morning and at bedtime., Disp: , Rfl:    nitroGLYCERIN (NITROSTAT) 0.4 MG SL tablet, PLACE 1 TABLET UNDER THE TONGUE EVERY 5 MINUTES AS NEEDED FOR CHEST PAIN (Patient taking differently: Place 0.4 mg under the tongue every 5 (five) minutes as needed for chest pain.), Disp: 25 tablet, Rfl: 5   pregabalin (LYRICA) 75 MG capsule, Take 75 mg by mouth every 12 (twelve) hours., Disp: , Rfl:    silodosin (RAPAFLO) 8 MG CAPS capsule, Take 8 mg by mouth at bedtime. , Disp: , Rfl:    torsemide (DEMADEX) 20 MG tablet, Take 2 tablets (40 mg total) by mouth daily. (Patient taking differently: Take 20 mg by mouth 2 (two) times daily.), Disp: 180 tablet, Rfl: 3   VITAMIN D PO, Take 1 capsule by mouth daily., Disp: , Rfl:     Physical Exam: Blood pressure (!) 147/53, pulse (!) 51, temperature 98.8 F (37.1 C), resp. rate 12, height '5\' 8"'$  (1.727 m), weight 69.9 kg, SpO2 100 %.    Elderly male not very verbal Lungs clear SEM post sternotomy Abdomen benign  Plus 2 LE edema with bruising Neuro non focal able to move all extremities   Labs:   Lab Results  Component Value Date   WBC 11.6 (H) 08/30/2022   HGB 10.9 (L) 08/30/2022   HCT 33.2 (L) 08/30/2022   MCV 97.1 08/30/2022   PLT 129 (L) 08/30/2022    Recent Labs  Lab 08/30/22 0253  NA 139  K 3.7  CL 99  CO2 30  BUN 31*  CREATININE 1.39*   CALCIUM 8.5*  PROT 6.6  BILITOT 1.2  ALKPHOS 97  ALT 21  AST 45*  GLUCOSE 125*   Lab Results  Component Value Date   CKTOTAL 57 08/20/2020   CKMB 2.2 05/10/2011   TROPONINI <0.30 05/10/2011    Lab Results  Component Value Date   CHOL 104 08/10/2022   CHOL 138 01/03/2018   CHOL 141 12/07/2016   Lab Results  Component Value Date   HDL 32 (L) 08/10/2022   HDL 53 01/03/2018   HDL 56 12/07/2016   Lab Results  Component Value Date   LDLCALC NOT CALCULATED 08/10/2022   LDLCALC 68 01/03/2018   LDLCALC 73 12/07/2016   Lab Results  Component Value Date   TRIG 73 08/10/2022   TRIG 86 01/03/2018   TRIG 59 12/07/2016   Lab Results  Component Value Date   CHOLHDL 3.3 08/10/2022   CHOLHDL 2.6 01/03/2018   CHOLHDL 2.5 12/07/2016   No results found for: "LDLDIRECT"    Radiology: DG Chest 2 View  Result Date: 08/30/2022 CLINICAL DATA:  Chest pain EXAM: CHEST - 2 VIEW COMPARISON:  08/09/2022 FINDINGS: Cardiac shadow is stable. Postsurgical changes are seen. Pacing device is again noted and stable. Mild increased central vascular congestion is noted with interstitial edema new from the prior exam. No focal confluent infiltrate is seen. No bony abnormality is noted. IMPRESSION: Changes consistent with CHF and interstitial edema. Electronically Signed   By: Inez Catalina M.D.   On: 08/30/2022 03:16   CUP PACEART REMOTE DEVICE CHECK  Result Date: 08/27/2022 Scheduled remote  reviewed. Normal device function.  Known atrial arrhythmias.  Programmed VVI. Next remote 91 days. Kathy Breach, RN, CCDS, [employer]  ECHOCARDIOGRAM COMPLETE  Result Date: 08/10/2022    ECHOCARDIOGRAM REPORT   Patient Name:   LEDGER HEINDL Date of Exam: 08/10/2022 Medical Rec #:  [id number]       Height:       68.0 in Accession #:    [id number]      Weight:       155.0 lb Date of Birth:  26-Nov-1924        BSA:          1.834 m Patient Age:    89 years        BP:           114/54 mmHg Patient Gender:  M               HR:           56 bpm. Exam Location:  Inpatient Procedure: 2D Echo, Cardiac Doppler and Color Doppler Indications:    NSTEMI I21.4  History:        Patient has prior history of Echocardiogram examinations. CHF,                 CAD, Prior CABG and Pacemaker, Arrythmias:Atrial Fibrillation                 and Tachy-Brady syndrome; Risk Factors:Hypertension and                 Dyslipidemia. AAA.  Sonographer:    [name] RDCS Referring Phys: [id number] [place]  1. Left ventricular ejection fraction, by estimation, is 50 to 55%. Left ventricular ejection fraction by 2D MOD biplane is 52.5 %. The left ventricle has low normal function. The left ventricle demonstrates global hypokinesis. Left ventricular diastolic parameters are indeterminate.  2. Right ventricular systolic function is low normal. The right ventricular size is normal. There is severely elevated pulmonary artery systolic pressure.  3. Left atrial size was severely dilated.  4. Right atrial size was moderately dilated.  5. The mitral valve is degenerative. Mild mitral valve regurgitation. No evidence of mitral stenosis.  6. Tricuspid valve regurgitation is moderate.  7. The aortic valve is tricuspid. There is mild calcification of the aortic valve. There is mild thickening of the aortic valve. Aortic valve regurgitation is trivial. Aortic valve sclerosis/calcification is present, without any evidence of aortic stenosis.  8. The inferior vena cava is dilated in size with <50% respiratory variability, suggesting right atrial pressure of 15 mmHg. FINDINGS  Left Ventricle: Left ventricular ejection fraction, by estimation, is 50 to 55%. Left ventricular ejection fraction by 2D MOD biplane is 52.5 %. The left ventricle has low normal function. The left ventricle demonstrates global hypokinesis. The left ventricular internal cavity size was normal in size. There is no left ventricular hypertrophy. Left ventricular  diastolic parameters are indeterminate. Right Ventricle: The right ventricular size is normal. No increase in right ventricular wall thickness. Right ventricular systolic function is low normal. There is severely elevated pulmonary artery systolic pressure. The tricuspid regurgitant velocity is 3.51 m/s, and with an assumed right atrial pressure of 15 mmHg, the estimated right ventricular systolic pressure is 30.8 mmHg. Left Atrium: Left atrial size was severely dilated. Right Atrium: Right atrial size was moderately dilated. Pericardium: There is no evidence of pericardial effusion. Mitral Valve: The mitral valve is degenerative in appearance. There is mild  thickening of the mitral valve leaflet(s). Mild mitral annular calcification. Mild mitral valve regurgitation. No evidence of mitral valve stenosis. MV peak gradient, 3.5 mmHg. The mean mitral valve gradient is 1.0 mmHg. Tricuspid Valve: The tricuspid valve is grossly normal. Tricuspid valve regurgitation is moderate . No evidence of tricuspid stenosis. Aortic Valve: The aortic valve is tricuspid. There is mild calcification of the aortic valve. There is mild thickening of the aortic valve. There is mild aortic valve annular calcification. Aortic valve regurgitation is trivial. Aortic valve sclerosis/calcification is present, without any evidence of aortic stenosis. Pulmonic Valve: The pulmonic valve was grossly normal. Pulmonic valve regurgitation is mild. No evidence of pulmonic stenosis. Aorta: The aortic root and ascending aorta are structurally normal, with no evidence of dilitation. Venous: The inferior vena cava is dilated in size with less than 50% respiratory variability, suggesting right atrial pressure of 15 mmHg. IAS/Shunts: No atrial level shunt detected by color flow Doppler. Additional Comments: A device lead is visualized.  LEFT VENTRICLE PLAX 2D                        Biplane EF (MOD) LVIDd:         5.70 cm         LV Biplane EF:   Left LVIDs:          4.80 cm                          ventricular LV PW:         0.70 cm                          ejection LV IVS:        0.80 cm                          fraction by LVOT diam:     2.10 cm                          2D MOD LV SV:         39                               biplane is LV SV Index:   21                               52.5 %. LVOT Area:     3.46 cm                                Diastology                                LV e' medial:    6.28 cm/s LV Volumes (MOD)               LV E/e' medial:  13.8 LV vol d, MOD    129.0 ml      LV e' lateral:   8.32 cm/s A2C:  LV E/e' lateral: 10.4 LV vol d, MOD    116.0 ml A4C: LV vol s, MOD    58.7 ml A2C: LV vol s, MOD    62.1 ml A4C: LV SV MOD A2C:   70.3 ml LV SV MOD A4C:   116.0 ml LV SV MOD BP:    67.0 ml RIGHT VENTRICLE RV S prime:     10.50 cm/s TAPSE (M-mode): 1.2 cm LEFT ATRIUM             Index        RIGHT ATRIUM           Index LA diam:        5.65 cm 3.08 cm/m   RA Area:     29.90 cm LA Vol (A2C):   66.6 ml 36.32 ml/m  RA Volume:   102.00 ml 55.62 ml/m LA Vol (A4C):   91.6 ml 49.95 ml/m LA Biplane Vol: 82.0 ml 44.71 ml/m  AORTIC VALVE LVOT Vmax:   61.50 cm/s LVOT Vmean:  33.600 cm/s LVOT VTI:    0.112 m  AORTA Ao Root diam: 3.30 cm Ao Asc diam:  3.30 cm MITRAL VALVE                  TRICUSPID VALVE MV Area (PHT): 5.52 cm       TV Peak grad:   49.6 mmHg MV Area VTI:   1.26 cm       TV Vmax:        3.52 m/s MV Peak grad:  3.5 mmHg       TR Peak grad:   49.3 mmHg MV Mean grad:  1.0 mmHg       TR Vmax:        351.00 cm/s MV Vmax:       0.94 m/s MV Vmean:      47.2 cm/s      SHUNTS MV Decel Time: 138 msec       Systemic VTI:  0.11 m MR Peak grad:    83.2 mmHg    Systemic Diam: 2.10 cm MR Mean grad:    54.0 mmHg MR Vmax:         456.00 cm/s MR Vmean:        348.0 cm/s MR PISA:         0.57 cm MR PISA Eff ROA: 3 mm MR PISA Radius:  0.30 cm MV E velocity: 86.50 cm/s Skeet Latch MD Electronically signed by Skeet Latch MD  Signature Date/Time: 08/10/2022/9:58:25 AM    Final    DG Chest 2 View  Result Date: 08/09/2022 CLINICAL DATA:  cp chest pain; pain by pacemaker EXAM: CHEST - 2 VIEW COMPARISON:  August 03, 2022; January 25, 2017 FINDINGS: The cardiomediastinal silhouette is unchanged in contour.Status post median sternotomy. Dual lead RIGHT chest cardiac pacing device. No pleural effusion. No pneumothorax. No acute pleuroparenchymal abnormality. Unchanged chronic interstitial markings. Partial visualization of endovascular repair of the abdominal aorta. Multilevel degenerative changes of the thoracic spine. IMPRESSION: No acute cardiopulmonary abnormality. Electronically Signed   By: Valentino Saxon M.D.   On: 08/09/2022 12:29   DG Chest 2 View  Result Date: 08/03/2022 CLINICAL DATA:  Fall 3 weeks ago onto RIGHT side, shortness of breath with RIGHT-sided bruising. EXAM: CHEST - 2 VIEW COMPARISON:  Chest x-rays dated 01/25/2017 and 01/20/2017. FINDINGS: Heart size and mediastinal contours are stable. RIGHT chest wall pacemaker/ICD apparatus in place. Median sternotomy wires appear intact and stable in  alignment. Lungs appear clear. No pleural effusion or pneumothorax is seen. No acute-appearing osseous abnormality. IMPRESSION: No acute findings. Lungs are clear. No pleural effusion or pneumothorax is seen. Electronically Signed   By: Franki Cabot M.D.   On: 08/03/2022 15:27    EKG: Chronic afib escape V pacing no acute changes    ASSESSMENT AND PLAN:   Chest Pain:  denies to me No acute ECG changes troponin negative see above regarding old grafts and DNR/Palliative status no w/u needed Dyspnea:  comfortable during my exam Not tachypnic CXR NAD sats 98% BNP mildly elevated Can give a dose of lasix EF 50-55% during recent admission for SEMI.  Arrange home oxygen and palliative services  Afib:  chronic he is on elquis with back up pacing PPM:  normal function Consider reprogramming escape rate to 60 bpm f/u Dr  Wiliam Ke to d/c from cardiology stand point May need hospitalist admission at discretion of wife  Signed: Jenkins Rouge 08/30/2022, 10:22 AM

## 2022-08-30 NOTE — ED Provider Triage Note (Signed)
Emergency Medicine Provider Triage Evaluation Note  Adam [name] , a 89 y.o. male  was evaluated in triage.  Pt complains of chest pain, described as pressure in center chest.  Had 324 ASA and 1 SL NTG-- pain resolved after medications.  Has been having some swelling in the feet.  NSTEMI 08/09/22, CABG 1980's.  Review of Systems  Positive: Chest pain, feet swelling Negative: fever  Physical Exam  BP 128/60 (BP Location: Right Arm)   Pulse (!) 55   Temp (!) 97.3 F (36.3 C)   Resp 20   Ht '5\' 8"'$  (1.727 m)   Wt 69.9 kg   SpO2 100%   BMI 23.42 kg/m   Gen:   Awake, no distress   Resp:  Normal effort  MSK:   Moves extremities without difficulty  Other:    Medical Decision Making  Medically screening exam initiated at 2:43 AM.  Appropriate orders placed.  JASEN HARTSTEIN was informed that the remainder of the evaluation will be completed by another provider, this initial triage assessment does not replace that evaluation, and the importance of remaining in the ED until their evaluation is complete.  Chest pain, resolved with ASA and SL NTG.  Hx NSTEMI in December 2023, CABG 1980's.  EKG, labs, CXR.   Larene Pickett, PA-C 08/30/22 [phone]

## 2022-09-01 ENCOUNTER — Encounter: Payer: Self-pay | Admitting: Podiatry

## 2022-09-01 ENCOUNTER — Ambulatory Visit (INDEPENDENT_AMBULATORY_CARE_PROVIDER_SITE_OTHER): Payer: Medicare Other | Admitting: Podiatry

## 2022-09-01 DIAGNOSIS — D2372 Other benign neoplasm of skin of left lower limb, including hip: Secondary | ICD-10-CM | POA: Diagnosis not present

## 2022-09-01 DIAGNOSIS — D2371 Other benign neoplasm of skin of right lower limb, including hip: Secondary | ICD-10-CM | POA: Diagnosis not present

## 2022-09-01 DIAGNOSIS — M79672 Pain in left foot: Secondary | ICD-10-CM | POA: Diagnosis not present

## 2022-09-01 DIAGNOSIS — D689 Coagulation defect, unspecified: Secondary | ICD-10-CM | POA: Diagnosis not present

## 2022-09-01 DIAGNOSIS — M79676 Pain in unspecified toe(s): Secondary | ICD-10-CM | POA: Diagnosis not present

## 2022-09-01 DIAGNOSIS — B351 Tinea unguium: Secondary | ICD-10-CM

## 2022-09-01 NOTE — Progress Notes (Signed)
He presents today with his wife for routine nail debridement.  His wife states he has been in the hospital recently to a heart attack.  Objective: Vital signs stable he is alert and oriented x 3.  Pulses are palpable.  Toenails are long thick yellow dystrophic onychomycotic.  Assessment: Pain limb secondary onychomycosis.  Plan: Debridement of nails 1 through 5 bilateral.

## 2022-09-03 ENCOUNTER — Other Ambulatory Visit (HOSPITAL_COMMUNITY): Payer: Self-pay

## 2022-09-03 ENCOUNTER — Telehealth: Payer: Self-pay | Admitting: Nurse Practitioner

## 2022-09-03 DIAGNOSIS — R0902 Hypoxemia: Secondary | ICD-10-CM

## 2022-09-03 NOTE — Telephone Encounter (Signed)
Patient's wife calling to discuss him switching to a portable oxygen tank. She says they are having a lot of trouble with switching.

## 2022-09-03 NOTE — Telephone Encounter (Signed)
Patient's wife states that they have still not been contacted in regards to a portable oxygen tank for the patient. She has called [facility] and was told that they have to wait 3 months in order to get a portable tank. The wife has been trying to get in touch with Caryl Pina but has not been able to speak with her. Would like to know if there is anything that we can do to assist with getting this done quicker.

## 2022-09-08 NOTE — Telephone Encounter (Signed)
Spoke with Rodena Piety at [facility] who states that the patient will need to be titrated for portable tanks. She states that we can send orders over for an OCP titration to keeps sats greater than 90% with a note stating that if patient qualifies please dispense OCP. Orders can be faxed to [phone]

## 2022-09-08 NOTE — Telephone Encounter (Signed)
Could we try to contact [facility] to find out what the hold up is? I do not have any other information or idea of a way to get their oxygen tank any quicker.

## 2022-09-16 DIAGNOSIS — I4891 Unspecified atrial fibrillation: Secondary | ICD-10-CM | POA: Diagnosis not present

## 2022-09-16 DIAGNOSIS — G479 Sleep disorder, unspecified: Secondary | ICD-10-CM | POA: Diagnosis not present

## 2022-09-16 DIAGNOSIS — G629 Polyneuropathy, unspecified: Secondary | ICD-10-CM | POA: Diagnosis not present

## 2022-09-16 DIAGNOSIS — E039 Hypothyroidism, unspecified: Secondary | ICD-10-CM | POA: Diagnosis not present

## 2022-09-16 DIAGNOSIS — Z950 Presence of cardiac pacemaker: Secondary | ICD-10-CM | POA: Diagnosis not present

## 2022-09-16 DIAGNOSIS — Z515 Encounter for palliative care: Secondary | ICD-10-CM | POA: Diagnosis not present

## 2022-09-16 DIAGNOSIS — M109 Gout, unspecified: Secondary | ICD-10-CM | POA: Diagnosis not present

## 2022-09-16 DIAGNOSIS — Z6823 Body mass index (BMI) 23.0-23.9, adult: Secondary | ICD-10-CM | POA: Diagnosis not present

## 2022-09-16 DIAGNOSIS — E785 Hyperlipidemia, unspecified: Secondary | ICD-10-CM | POA: Diagnosis not present

## 2022-09-16 DIAGNOSIS — Z0000 Encounter for general adult medical examination without abnormal findings: Secondary | ICD-10-CM | POA: Diagnosis not present

## 2022-09-16 DIAGNOSIS — R3911 Hesitancy of micturition: Secondary | ICD-10-CM | POA: Diagnosis not present

## 2022-09-16 DIAGNOSIS — D6869 Other thrombophilia: Secondary | ICD-10-CM | POA: Diagnosis not present

## 2022-09-16 NOTE — Progress Notes (Signed)
Remote pacemaker transmission.   

## 2022-09-17 DIAGNOSIS — N183 Chronic kidney disease, stage 3 unspecified: Secondary | ICD-10-CM | POA: Diagnosis not present

## 2022-09-17 DIAGNOSIS — M109 Gout, unspecified: Secondary | ICD-10-CM | POA: Diagnosis not present

## 2022-09-17 DIAGNOSIS — M4800 Spinal stenosis, site unspecified: Secondary | ICD-10-CM | POA: Diagnosis not present

## 2022-09-17 DIAGNOSIS — K219 Gastro-esophageal reflux disease without esophagitis: Secondary | ICD-10-CM | POA: Diagnosis not present

## 2022-09-17 DIAGNOSIS — I255 Ischemic cardiomyopathy: Secondary | ICD-10-CM | POA: Diagnosis not present

## 2022-09-17 DIAGNOSIS — Z9181 History of falling: Secondary | ICD-10-CM | POA: Diagnosis not present

## 2022-09-17 DIAGNOSIS — Z8679 Personal history of other diseases of the circulatory system: Secondary | ICD-10-CM | POA: Diagnosis not present

## 2022-09-17 DIAGNOSIS — H409 Unspecified glaucoma: Secondary | ICD-10-CM | POA: Diagnosis not present

## 2022-09-17 DIAGNOSIS — Z950 Presence of cardiac pacemaker: Secondary | ICD-10-CM | POA: Diagnosis not present

## 2022-09-17 DIAGNOSIS — I5022 Chronic systolic (congestive) heart failure: Secondary | ICD-10-CM | POA: Diagnosis not present

## 2022-09-17 DIAGNOSIS — I2510 Atherosclerotic heart disease of native coronary artery without angina pectoris: Secondary | ICD-10-CM | POA: Diagnosis not present

## 2022-09-17 DIAGNOSIS — I130 Hypertensive heart and chronic kidney disease with heart failure and stage 1 through stage 4 chronic kidney disease, or unspecified chronic kidney disease: Secondary | ICD-10-CM | POA: Diagnosis not present

## 2022-09-17 DIAGNOSIS — Z951 Presence of aortocoronary bypass graft: Secondary | ICD-10-CM | POA: Diagnosis not present

## 2022-09-17 DIAGNOSIS — N400 Enlarged prostate without lower urinary tract symptoms: Secondary | ICD-10-CM | POA: Diagnosis not present

## 2022-09-17 DIAGNOSIS — I252 Old myocardial infarction: Secondary | ICD-10-CM | POA: Diagnosis not present

## 2022-09-17 DIAGNOSIS — I495 Sick sinus syndrome: Secondary | ICD-10-CM | POA: Diagnosis not present

## 2022-09-17 DIAGNOSIS — I482 Chronic atrial fibrillation, unspecified: Secondary | ICD-10-CM | POA: Diagnosis not present

## 2022-09-17 DIAGNOSIS — Z9981 Dependence on supplemental oxygen: Secondary | ICD-10-CM | POA: Diagnosis not present

## 2022-09-17 DIAGNOSIS — G629 Polyneuropathy, unspecified: Secondary | ICD-10-CM | POA: Diagnosis not present

## 2022-09-18 DIAGNOSIS — I130 Hypertensive heart and chronic kidney disease with heart failure and stage 1 through stage 4 chronic kidney disease, or unspecified chronic kidney disease: Secondary | ICD-10-CM | POA: Diagnosis not present

## 2022-09-18 DIAGNOSIS — I252 Old myocardial infarction: Secondary | ICD-10-CM | POA: Diagnosis not present

## 2022-09-18 DIAGNOSIS — I2510 Atherosclerotic heart disease of native coronary artery without angina pectoris: Secondary | ICD-10-CM | POA: Diagnosis not present

## 2022-09-18 DIAGNOSIS — N183 Chronic kidney disease, stage 3 unspecified: Secondary | ICD-10-CM | POA: Diagnosis not present

## 2022-09-18 DIAGNOSIS — I482 Chronic atrial fibrillation, unspecified: Secondary | ICD-10-CM | POA: Diagnosis not present

## 2022-09-18 DIAGNOSIS — I5022 Chronic systolic (congestive) heart failure: Secondary | ICD-10-CM | POA: Diagnosis not present

## 2022-09-21 DIAGNOSIS — I130 Hypertensive heart and chronic kidney disease with heart failure and stage 1 through stage 4 chronic kidney disease, or unspecified chronic kidney disease: Secondary | ICD-10-CM | POA: Diagnosis not present

## 2022-09-21 DIAGNOSIS — I2510 Atherosclerotic heart disease of native coronary artery without angina pectoris: Secondary | ICD-10-CM | POA: Diagnosis not present

## 2022-09-21 DIAGNOSIS — I252 Old myocardial infarction: Secondary | ICD-10-CM | POA: Diagnosis not present

## 2022-09-21 DIAGNOSIS — N183 Chronic kidney disease, stage 3 unspecified: Secondary | ICD-10-CM | POA: Diagnosis not present

## 2022-09-21 DIAGNOSIS — I5022 Chronic systolic (congestive) heart failure: Secondary | ICD-10-CM | POA: Diagnosis not present

## 2022-09-21 DIAGNOSIS — I482 Chronic atrial fibrillation, unspecified: Secondary | ICD-10-CM | POA: Diagnosis not present

## 2022-09-21 NOTE — Progress Notes (Deleted)
Cardiology Office Note:    Date:  09/21/2022   ID:  [name], DOB 1924-09-04, [name] [id number]  PCP:  [name], MD   [facility] Providers Cardiologist:  Sinclair Grooms, MD (Inactive)     Referring MD: [name], MD   Chief Complaint: hospital follow-up s/p MI  History of Present Illness:    FAUSTINO LUECKE is a very pleasant 89 y.o. male with a hx of CAD s/p CABG x 6 (bypass graft failure saphenous vein to the obtuse marginal) in the late 80s, ICM, tachybradycardia syndrome s/p PPM 2008 ([employer]), AAA, chronic HFrEF, chronic AF, HTN, HLD and multifactorial shortness of breath.  Initially seen by Dr. [name] in 2012 when he was cardioverted for recurrent atrial fibrillation.  2D echo completed in 2017 with a EF of 35%.  Seen by Ambrose Pancoast, NP on 05/13/2022 for shortness of breath.  He had a fall 2 months prior without head injury and had not been as active recently.  He has diuretic was changed to torsemide 40 mg by Dr. Caryl Comes May 2023.  He was encouraged at [place] 9/27 to take extra half tablet of Demadex if weight increased 2 pounds in 24 hours or 5 pounds in 1 week. No further testing was ordered.   Seen by Dr. [name] on 08/03/22 at which time he was feeling a little tired and weak but otherwise stable.  No changes were made to medical therapy and labs collected on that day were reported as stable by Dr. [name].  Admission 12/24-12/26/23. He presented to [facility] with 3 weeks of central chest pressure with associated SOB. Hs troponin peaked at 6499, being managed medically per patient/family wishes. Patient confirmed DNR status.  Was treated with IV heparin for 48 hours and transitioned back to Eliquis 5 mg twice daily. TTE 12/25 revealed LVEF 2 to 55%, global hypokinesis, severely elevated PA pressure, severely dilated LA, moderately dilated RA, moderate TR.  He was not placed on Plavix due to need for [place]. No BB with bradycardia.  Seen by me on 08/20/22 with his wife,  daughter, and son for follow-up. Overall, feeling pretty good since discharge. Is weaker than prior to admission but is not having any discomfort. Able to ambulate with a rolling walker. Has oxygen tank and is using oxygen around the clock, which is new for him. Was able to take a 25 minute shower the day prior to this visit without oxygen and had no respiratory distress or angina. Leg swelling has improved significantly since he has been elevating legs and wearing compression. [employer] is managing oxygen equipment, they would like to get a portable tank. Denied chest pain, dyspnea, orthopnea, PND, palpitations, presyncope, syncope.  Per family request, I contacted [employer] for portable oxygen so he can have more mobility. Was having some minor nosebleeds with nasal cannula. No medication changes were made and he was advised to monitor home BP and return in 1 month for follow-up.  Today, he is here   Past Medical History:  Diagnosis Date   AAA (abdominal aortic aneurysm) (HCC)    Anginal pain (HCC)    Arthritis    CAD (coronary artery disease)    [name]   CHF (congestive heart failure) (HCC)    Chronic anticoagulation    Diverticulosis    GERD (gastroesophageal reflux disease)    Hyperlipidemia    Hypertension    LBP (low back pain)    Permanent atrial fibrillation ([place])  decision made at [facility] 2015   Presence of permanent cardiac pacemaker    S/P CABG (coronary artery bypass graft)    Shortness of breath dyspnea    Sleep apnea     Past Surgical History:  Procedure Laterality Date   CARDIOVERSION  06/30/2012   Procedure: CARDIOVERSION;  Surgeon: Sinclair Grooms, MD;  Location: [place];  Service: Cardiovascular;  Laterality: N/A;   CARDIOVERSION N/A 11/23/2013   Procedure: CARDIOVERSION;  Surgeon: Sinclair Grooms, MD;  Location: [facility] ENDOSCOPY;  Service: Cardiovascular;  Laterality: N/A;   CATARACT EXTRACTION W/ INTRAOCULAR LENS  IMPLANT, BILATERAL     CORONARY  ARTERY BYPASS GRAFT  Feb 1989   "CABG X6"   ENDOVASCULAR STENT INSERTION  07/28/2011   Procedure: ENDOVASCULAR STENT GRAFT INSERTION;  Surgeon: Hinda Lenis, MD;  Location: [place];  Service: Vascular;  Laterality: N/A;  Pt. on Coumadin/ instructed to hold 5 days prior to procedure   [place] Right 01/04/2015   INGUINAL HERNIA REPAIR Right 01/04/2015   Procedure:  [place];  Surgeon: Fanny Skates, MD;  Location: [place];  Service: General;  Laterality: Right;   INSERT / REPLACE / REMOVE PACEMAKER  July 2008   INSERTION OF MESH Right 01/04/2015   Procedure: INSERTION OF MESH;  Surgeon: Fanny Skates, MD;  Location: [place];  Service: General;  Laterality: Right;   [place]   Bilateral total knee replacements   LEFT HEART CATHETERIZATION WITH CORONARY ANGIOGRAM N/A 03/28/2014   Procedure: LEFT HEART CATHETERIZATION WITH CORONARY ANGIOGRAM;  Surgeon: Sinclair Grooms, MD;  Location[facility] CATH LAB;  Service: Cardiovascular;  Laterality: N/A;   PERMANENT PACEMAKER GENERATOR CHANGE N/A 11/03/2013   Procedure: PERMANENT PACEMAKER GENERATOR CHANGE;  Surgeon: [name], MD;  Location: [facility] CATH LAB;  Service: Cardiovascular;  Laterality: N/A;   TONSILLECTOMY      Current Medications: No outpatient medications have been marked as taking for the 09/23/22 encounter (Appointment) with Ann Maki, Lanice Schwab, NP.     Allergies:   Amlodipine, Codeine, Lisinopril, Morphine, and Amiodarone   Social History   Socioeconomic History   Marital status: Married    Spouse name: Not on file   Number of children: Not on file   Years of education: Not on file   Highest education level: Not on file  Occupational History   Not on file  Tobacco Use   Smoking status: Former    Years: 3.00    Types: Cigarettes    Quit date: 08/17/1948    Years since quitting: 74.1   Smokeless tobacco: Never  Vaping Use   Vaping Use: Never used   Substance and Sexual Activity   Alcohol use: No   Drug use: No   Sexual activity: Not on file  Other Topics Concern   Not on file  Social History Narrative   Not on file   Social Determinants of Health   Financial Resource Strain: Not on file  Food Insecurity: No Food Insecurity (08/10/2022)   Hunger Vital Sign    Worried About Running Out of Food in the Last Year: Never true    Ran Out of Food in the Last Year: Never true  Transportation Needs: No Transportation Needs (08/10/2022)   PRAPARE - Hydrologist (Medical): No    Lack of Transportation (Non-Medical): No  Physical Activity: Not on file  Stress:  Not on file  Social Connections: Not on file     Family History: The patient's family history includes Cancer in his sister; Heart attack in his father; Heart disease in his father and sister; Heart failure in his mother; Other in his sister; Stroke in his mother; Varicose Veins in his mother.  ROS:   Please see the history of present illness.    + weakness  All other systems reviewed and are negative.  Labs/Other Studies Reviewed:    The following studies were reviewed today:  Echo 08/10/22 1. Left ventricular ejection fraction, by estimation, is 50 to 55%. Left  ventricular ejection fraction by 2D MOD biplane is 52.5 %. The left  ventricle has low normal function. The left ventricle demonstrates global  hypokinesis. Left ventricular  diastolic parameters are indeterminate.   2. Right ventricular systolic function is low normal. The right  ventricular size is normal. There is severely elevated pulmonary artery  systolic pressure.   3. Left atrial size was severely dilated.   4. Right atrial size was moderately dilated.   5. The mitral valve is degenerative. Mild mitral valve regurgitation. No  evidence of mitral stenosis.   6. Tricuspid valve regurgitation is moderate.   7. The aortic valve is tricuspid. There is mild calcification of  the  aortic valve. There is mild thickening of the aortic valve. Aortic valve  regurgitation is trivial. Aortic valve sclerosis/calcification is present,  without any evidence of aortic  stenosis.   8. The inferior vena cava is dilated in size with <50% respiratory  variability, suggesting right atrial pressure of 15 mmHg.  VAS [place] EVAR Duplex 11/25/21 Abdominal Aorta: Patent endovascular aneurysm repair with no evidence of  endoleak.    *See table(s) above for measurements and observations.    Recent Labs: 08/03/2022: NT-Pro BNP 3,399 08/30/2022: ALT 21; B Natriuretic Peptide 676.8; BUN 31; Creatinine, Ser 1.39; Hemoglobin 10.9; Platelets 129; Potassium 3.7; Sodium 139  Recent Lipid Panel    Component Value Date/Time   CHOL 104 08/10/2022 0201   CHOL 138 01/03/2018 1146   TRIG 73 08/10/2022 0201   HDL 32 (L) 08/10/2022 0201   HDL 53 01/03/2018 1146   CHOLHDL 3.3 08/10/2022 0201   VLDL 15 08/10/2022 0201   LDLCALC NOT CALCULATED 08/10/2022 0201   LDLCALC 68 01/03/2018 1146     Risk Assessment/Calculations:    CHA2DS2-VASc Score = 5  {This indicates a 7.2% annual risk of stroke. The patient's score is based upon: CHF History: 1 HTN History: 1 Diabetes History: 0 Stroke History: 0 Vascular Disease History: 1 Age Score: 2 Gender Score: 0    Physical Exam:    VS:  There were no vitals taken for this visit.    Wt Readings from Last 3 Encounters:  08/30/22 154 lb (69.9 kg)  08/20/22 154 lb 9.6 oz (70.1 kg)  08/09/22 155 lb (70.3 kg)     GEN: Frail, chronically ill appearing in no acute distress HEENT: Normal NECK: No JVD; No carotid bruits CARDIAC: Irregular RR, no murmurs, rubs, gallops RESPIRATORY:  Clear to auscultation without rales, wheezing or rhonchi  ABDOMEN: Soft, non-tender, non-distended MUSCULOSKELETAL:  Bilateral LE nonpitting edema; Diffuse bruising. 2+ pedal pulses, equal bilaterally SKIN: Warm and dry. Diffuse bruising without  bleeding NEUROLOGIC:  Alert and oriented x 3 PSYCHIATRIC:  Normal affect   EKG:  EKG is ***   Diagnoses:    No diagnosis found.  Assessment and Plan:     NSTEMI: Admission 08/09/2022  with NSTEMI, hs trop peaked at 6499, medical management per patient and family wishes. He is doing well today. Denies chest pain, significant dyspnea although he has not been as active since hospital d/c. Minor nosebleeds since starting O2 and skin bruising/mild bleeding. Continue medical management with aspirin, statin. No BB due to bradycardia. No Plavix due to [facility]. Notify us if bleeding worsens.   Hypoxia: Now on chronic O2 4 low O2 sats with ambulation during hospitalization. O2 sats 100% on O2. Has been off oxygen for showers with no acute distress. Family requested a portable O2 tank. I called [employer] and spoke with the clinical liaison who will reach out to patient and family. Encouraged patient and family to continue to monitor activity tolerance and O2 response for activities when not wearing oxygen.   Chronic atrial fibrillation on chronic anticoagulation: Clinically appears to be in atrial fibrillation with well-controlled ventricular rate.  Has had some minor bleeding issues since hospitalization. Encouraged family to notify us with significant bleeding concerns. No palpitations, tachycardia. Continue Eliquis 5 mg twice daily which is appropriate dose for patient age/weight/creatinine.   Tachy-bradycardia syndrome s/p PPM implant: Continue remote device monitoring. Will schedule follow-up with Dr. Caryl Comes due in May.   Hyperlipidemia LDL goal < 70: Normal lipoprotein a, unable to calculate LDL during hospitalization   Chronic HFrEF: History of reduced LVEF 35% from 2017 with improvement of LVEF to 50 to 55% 08/10/2022.  Some evidence of volume overload during admission. Appears euvolemic on exam today. Mild LE edema, no dyspnea, orthopnea, PND. Continue Farxiga, torsemide.      Disposition:   ***  Medication Adjustments/Labs and Tests Ordered: Current medicines are reviewed at length with the patient today.  Concerns regarding medicines are outlined above.  No orders of the defined types were placed in this encounter.  No orders of the defined types were placed in this encounter.   There are no Patient Instructions on file for this visit.   Signed, Emmaline Life, NP  09/21/2022 11:05 AM    [place]

## 2022-09-23 ENCOUNTER — Ambulatory Visit: Payer: Medicare Other | Admitting: Nurse Practitioner

## 2022-09-28 DIAGNOSIS — I2510 Atherosclerotic heart disease of native coronary artery without angina pectoris: Secondary | ICD-10-CM | POA: Diagnosis not present

## 2022-09-28 DIAGNOSIS — I130 Hypertensive heart and chronic kidney disease with heart failure and stage 1 through stage 4 chronic kidney disease, or unspecified chronic kidney disease: Secondary | ICD-10-CM | POA: Diagnosis not present

## 2022-09-28 DIAGNOSIS — I5022 Chronic systolic (congestive) heart failure: Secondary | ICD-10-CM | POA: Diagnosis not present

## 2022-09-28 DIAGNOSIS — I482 Chronic atrial fibrillation, unspecified: Secondary | ICD-10-CM | POA: Diagnosis not present

## 2022-09-28 DIAGNOSIS — N183 Chronic kidney disease, stage 3 unspecified: Secondary | ICD-10-CM | POA: Diagnosis not present

## 2022-09-28 DIAGNOSIS — I252 Old myocardial infarction: Secondary | ICD-10-CM | POA: Diagnosis not present

## 2022-09-30 DIAGNOSIS — I5022 Chronic systolic (congestive) heart failure: Secondary | ICD-10-CM | POA: Diagnosis not present

## 2022-09-30 DIAGNOSIS — I482 Chronic atrial fibrillation, unspecified: Secondary | ICD-10-CM | POA: Diagnosis not present

## 2022-09-30 DIAGNOSIS — N183 Chronic kidney disease, stage 3 unspecified: Secondary | ICD-10-CM | POA: Diagnosis not present

## 2022-09-30 DIAGNOSIS — I130 Hypertensive heart and chronic kidney disease with heart failure and stage 1 through stage 4 chronic kidney disease, or unspecified chronic kidney disease: Secondary | ICD-10-CM | POA: Diagnosis not present

## 2022-09-30 DIAGNOSIS — I2510 Atherosclerotic heart disease of native coronary artery without angina pectoris: Secondary | ICD-10-CM | POA: Diagnosis not present

## 2022-09-30 DIAGNOSIS — I252 Old myocardial infarction: Secondary | ICD-10-CM | POA: Diagnosis not present

## 2022-10-02 DIAGNOSIS — I252 Old myocardial infarction: Secondary | ICD-10-CM | POA: Diagnosis not present

## 2022-10-02 DIAGNOSIS — I482 Chronic atrial fibrillation, unspecified: Secondary | ICD-10-CM | POA: Diagnosis not present

## 2022-10-02 DIAGNOSIS — N183 Chronic kidney disease, stage 3 unspecified: Secondary | ICD-10-CM | POA: Diagnosis not present

## 2022-10-02 DIAGNOSIS — I2510 Atherosclerotic heart disease of native coronary artery without angina pectoris: Secondary | ICD-10-CM | POA: Diagnosis not present

## 2022-10-02 DIAGNOSIS — I130 Hypertensive heart and chronic kidney disease with heart failure and stage 1 through stage 4 chronic kidney disease, or unspecified chronic kidney disease: Secondary | ICD-10-CM | POA: Diagnosis not present

## 2022-10-02 DIAGNOSIS — I5022 Chronic systolic (congestive) heart failure: Secondary | ICD-10-CM | POA: Diagnosis not present

## 2022-10-05 ENCOUNTER — Other Ambulatory Visit: Payer: Self-pay | Admitting: *Deleted

## 2022-10-05 DIAGNOSIS — N183 Chronic kidney disease, stage 3 unspecified: Secondary | ICD-10-CM | POA: Diagnosis not present

## 2022-10-05 DIAGNOSIS — I2510 Atherosclerotic heart disease of native coronary artery without angina pectoris: Secondary | ICD-10-CM | POA: Diagnosis not present

## 2022-10-05 DIAGNOSIS — I5022 Chronic systolic (congestive) heart failure: Secondary | ICD-10-CM | POA: Diagnosis not present

## 2022-10-05 DIAGNOSIS — I252 Old myocardial infarction: Secondary | ICD-10-CM | POA: Diagnosis not present

## 2022-10-05 DIAGNOSIS — I482 Chronic atrial fibrillation, unspecified: Secondary | ICD-10-CM | POA: Diagnosis not present

## 2022-10-05 DIAGNOSIS — I130 Hypertensive heart and chronic kidney disease with heart failure and stage 1 through stage 4 chronic kidney disease, or unspecified chronic kidney disease: Secondary | ICD-10-CM | POA: Diagnosis not present

## 2022-10-05 MED ORDER — DAPAGLIFLOZIN PROPANEDIOL 10 MG PO TABS: 10.0000 mg | ORAL_TABLET | Freq: Every day | ORAL | 3 refills | Status: DC

## 2022-10-07 DIAGNOSIS — I5022 Chronic systolic (congestive) heart failure: Secondary | ICD-10-CM | POA: Diagnosis not present

## 2022-10-07 DIAGNOSIS — I252 Old myocardial infarction: Secondary | ICD-10-CM | POA: Diagnosis not present

## 2022-10-07 DIAGNOSIS — I482 Chronic atrial fibrillation, unspecified: Secondary | ICD-10-CM | POA: Diagnosis not present

## 2022-10-07 DIAGNOSIS — I2510 Atherosclerotic heart disease of native coronary artery without angina pectoris: Secondary | ICD-10-CM | POA: Diagnosis not present

## 2022-10-07 DIAGNOSIS — N183 Chronic kidney disease, stage 3 unspecified: Secondary | ICD-10-CM | POA: Diagnosis not present

## 2022-10-07 DIAGNOSIS — I130 Hypertensive heart and chronic kidney disease with heart failure and stage 1 through stage 4 chronic kidney disease, or unspecified chronic kidney disease: Secondary | ICD-10-CM | POA: Diagnosis not present

## 2022-10-16 DEATH — deceased

## 2022-10-27 ENCOUNTER — Telehealth: Payer: Self-pay | Admitting: Internal Medicine

## 2022-10-27 NOTE — Telephone Encounter (Signed)
New Message::     Wife called and wanted to make sure Dr Caryl Comes knew that the patient passed away on 2*21-24.

## 2022-10-28 NOTE — Telephone Encounter (Signed)
Called pts spouse following the death of her husband -- left VM

## 2022-11-11 ENCOUNTER — Telehealth: Payer: Self-pay | Admitting: Podiatry

## 2022-11-11 NOTE — Telephone Encounter (Signed)
Pts wife called to cancel pts appt for 4.16 with Dr Milinda Pointer as he has passed away.  I did give condolences for the wife's loss.

## 2022-12-01 ENCOUNTER — Ambulatory Visit: Payer: Medicare Other | Admitting: Podiatry

## 2022-12-02 ENCOUNTER — Ambulatory Visit: Payer: Medicare Other | Admitting: Nurse Practitioner

## 2022-12-04 ENCOUNTER — Ambulatory Visit: Payer: Medicare Other | Admitting: Cardiology

## 2022-12-31 ENCOUNTER — Telehealth: Payer: Self-pay | Admitting: Internal Medicine

## 2022-12-31 NOTE — Telephone Encounter (Signed)
Patient's spouse is calling to know if she needs to turn in the heart monitor. Patient's spouse stated that she tried calling the heart monitor company but struggles to get anyone on the line. Please advise.

## 2023-01-01 NOTE — Telephone Encounter (Signed)
I spoke with the patient wife. I ordered him a return kit. She will receive it in 7-10 business days.

## 2023-01-12 ENCOUNTER — Encounter: Payer: Medicare Other | Admitting: Internal Medicine

## 2023-09-02 ENCOUNTER — Ambulatory Visit: Payer: Medicare Other | Admitting: Podiatry
